# Patient Record
Sex: Female | Born: 1940 | Race: White | Hispanic: No | Marital: Married | Smoking: Never smoker
Health system: Southern US, Community
[De-identification: ages and names within clinical notes are randomized; demographics above are authoritative.]

## PROBLEM LIST (undated history)

## (undated) DIAGNOSIS — E785 Hyperlipidemia, unspecified: Secondary | ICD-10-CM

## (undated) DIAGNOSIS — M2550 Pain in unspecified joint: Secondary | ICD-10-CM

## (undated) DIAGNOSIS — D649 Anemia, unspecified: Secondary | ICD-10-CM

## (undated) DIAGNOSIS — I10 Essential (primary) hypertension: Secondary | ICD-10-CM

## (undated) DIAGNOSIS — F329 Major depressive disorder, single episode, unspecified: Secondary | ICD-10-CM

## (undated) DIAGNOSIS — R609 Edema, unspecified: Secondary | ICD-10-CM

## (undated) DIAGNOSIS — L89300 Pressure ulcer of unspecified buttock, unstageable: Secondary | ICD-10-CM

## (undated) DIAGNOSIS — R600 Localized edema: Secondary | ICD-10-CM

## (undated) DIAGNOSIS — M1990 Unspecified osteoarthritis, unspecified site: Secondary | ICD-10-CM

## (undated) DIAGNOSIS — L089 Local infection of the skin and subcutaneous tissue, unspecified: Secondary | ICD-10-CM

## (undated) DIAGNOSIS — M2540 Effusion, unspecified joint: Secondary | ICD-10-CM

## (undated) DIAGNOSIS — F32A Depression, unspecified: Secondary | ICD-10-CM

## (undated) DIAGNOSIS — H269 Unspecified cataract: Secondary | ICD-10-CM

## (undated) HISTORY — PX: BREAST BIOPSY: SHX20

## (undated) HISTORY — PX: ERCP: SHX60

## (undated) HISTORY — DX: Essential (primary) hypertension: I10

## (undated) HISTORY — PX: ESOPHAGOGASTRODUODENOSCOPY: SHX1529

---

## 1999-11-04 ENCOUNTER — Encounter: Admission: RE | Admit: 1999-11-04 | Discharge: 1999-11-04 | Payer: Self-pay

## 2000-11-07 ENCOUNTER — Encounter: Admission: RE | Admit: 2000-11-07 | Discharge: 2000-11-07 | Payer: Self-pay | Admitting: Gynecology

## 2000-11-07 ENCOUNTER — Encounter: Payer: Self-pay | Admitting: Gynecology

## 2001-08-16 ENCOUNTER — Other Ambulatory Visit: Admission: RE | Admit: 2001-08-16 | Discharge: 2001-08-16 | Payer: Self-pay | Admitting: Gynecology

## 2001-12-21 ENCOUNTER — Encounter: Payer: Self-pay | Admitting: Gynecology

## 2001-12-21 ENCOUNTER — Encounter: Admission: RE | Admit: 2001-12-21 | Discharge: 2001-12-21 | Payer: Self-pay | Admitting: Gynecology

## 2002-12-10 ENCOUNTER — Other Ambulatory Visit: Admission: RE | Admit: 2002-12-10 | Discharge: 2002-12-10 | Payer: Self-pay | Admitting: Gynecology

## 2003-01-15 ENCOUNTER — Encounter: Admission: RE | Admit: 2003-01-15 | Discharge: 2003-01-15 | Payer: Self-pay | Admitting: Gynecology

## 2003-01-15 ENCOUNTER — Encounter: Payer: Self-pay | Admitting: Gynecology

## 2004-01-08 ENCOUNTER — Other Ambulatory Visit: Admission: RE | Admit: 2004-01-08 | Discharge: 2004-01-08 | Payer: Self-pay | Admitting: Gynecology

## 2004-02-21 ENCOUNTER — Ambulatory Visit (HOSPITAL_COMMUNITY): Admission: RE | Admit: 2004-02-21 | Discharge: 2004-02-21 | Payer: Self-pay | Admitting: Gynecology

## 2005-03-08 ENCOUNTER — Other Ambulatory Visit: Admission: RE | Admit: 2005-03-08 | Discharge: 2005-03-08 | Payer: Self-pay | Admitting: Gynecology

## 2005-04-13 ENCOUNTER — Ambulatory Visit (HOSPITAL_COMMUNITY): Admission: RE | Admit: 2005-04-13 | Discharge: 2005-04-13 | Payer: Self-pay | Admitting: Gynecology

## 2005-04-22 ENCOUNTER — Encounter: Admission: RE | Admit: 2005-04-22 | Discharge: 2005-04-22 | Payer: Self-pay | Admitting: Gynecology

## 2005-04-28 ENCOUNTER — Encounter: Admission: RE | Admit: 2005-04-28 | Discharge: 2005-04-28 | Payer: Self-pay | Admitting: Gynecology

## 2005-04-28 ENCOUNTER — Encounter (INDEPENDENT_AMBULATORY_CARE_PROVIDER_SITE_OTHER): Payer: Self-pay | Admitting: *Deleted

## 2006-05-03 ENCOUNTER — Encounter: Admission: RE | Admit: 2006-05-03 | Discharge: 2006-05-03 | Payer: Self-pay | Admitting: Gynecology

## 2006-12-06 HISTORY — PX: COLONOSCOPY: SHX174

## 2007-01-23 ENCOUNTER — Other Ambulatory Visit: Admission: RE | Admit: 2007-01-23 | Discharge: 2007-01-23 | Payer: Self-pay | Admitting: Gynecology

## 2007-05-19 ENCOUNTER — Encounter: Admission: RE | Admit: 2007-05-19 | Discharge: 2007-05-19 | Payer: Self-pay | Admitting: Family Medicine

## 2007-10-25 ENCOUNTER — Ambulatory Visit: Payer: Self-pay | Admitting: Unknown Physician Specialty

## 2008-06-25 ENCOUNTER — Encounter: Admission: RE | Admit: 2008-06-25 | Discharge: 2008-06-25 | Payer: Self-pay | Admitting: Family Medicine

## 2009-07-07 ENCOUNTER — Encounter: Admission: RE | Admit: 2009-07-07 | Discharge: 2009-07-07 | Payer: Self-pay | Admitting: Family Medicine

## 2010-07-21 ENCOUNTER — Ambulatory Visit: Payer: Self-pay | Admitting: Family Medicine

## 2010-12-27 ENCOUNTER — Encounter: Payer: Self-pay | Admitting: Gynecology

## 2011-08-05 ENCOUNTER — Ambulatory Visit: Payer: Self-pay | Admitting: Family Medicine

## 2011-09-07 ENCOUNTER — Other Ambulatory Visit: Payer: Self-pay | Admitting: Gynecology

## 2012-04-07 LAB — LIPID PANEL: Cholesterol: 163 mg/dL (ref 0–200)

## 2012-05-25 ENCOUNTER — Ambulatory Visit: Payer: Self-pay | Admitting: Nurse Practitioner

## 2012-06-12 ENCOUNTER — Ambulatory Visit: Payer: Self-pay | Admitting: Gastroenterology

## 2012-07-10 ENCOUNTER — Ambulatory Visit: Payer: Self-pay | Admitting: Family Medicine

## 2012-07-13 ENCOUNTER — Ambulatory Visit: Payer: Self-pay | Admitting: Family Medicine

## 2012-07-20 ENCOUNTER — Ambulatory Visit (INDEPENDENT_AMBULATORY_CARE_PROVIDER_SITE_OTHER): Payer: MEDICARE | Admitting: Family Medicine

## 2012-07-20 ENCOUNTER — Encounter: Payer: Self-pay | Admitting: Family Medicine

## 2012-07-20 VITALS — BP 140/74 | HR 82 | Resp 18 | Ht 69.0 in | Wt 261.0 lb

## 2012-07-20 DIAGNOSIS — I10 Essential (primary) hypertension: Secondary | ICD-10-CM

## 2012-07-20 DIAGNOSIS — E119 Type 2 diabetes mellitus without complications: Secondary | ICD-10-CM | POA: Insufficient documentation

## 2012-07-20 DIAGNOSIS — M7989 Other specified soft tissue disorders: Secondary | ICD-10-CM | POA: Insufficient documentation

## 2012-07-20 DIAGNOSIS — R600 Localized edema: Secondary | ICD-10-CM | POA: Insufficient documentation

## 2012-07-20 DIAGNOSIS — F419 Anxiety disorder, unspecified: Secondary | ICD-10-CM | POA: Insufficient documentation

## 2012-07-20 DIAGNOSIS — Z1239 Encounter for other screening for malignant neoplasm of breast: Secondary | ICD-10-CM

## 2012-07-20 DIAGNOSIS — E669 Obesity, unspecified: Secondary | ICD-10-CM

## 2012-07-20 DIAGNOSIS — F411 Generalized anxiety disorder: Secondary | ICD-10-CM

## 2012-07-20 DIAGNOSIS — R609 Edema, unspecified: Secondary | ICD-10-CM

## 2012-07-20 NOTE — Patient Instructions (Addendum)
We will schedule Mammogram for  [facility]  Continue your current medications I will get your records  F/U 2 months

## 2012-07-20 NOTE — Assessment & Plan Note (Signed)
Continue lasix  °

## 2012-07-20 NOTE — Assessment & Plan Note (Signed)
Fasting CBG look very good, advised her no change in meds today, obtain her recent A1C.

## 2012-07-20 NOTE — Assessment & Plan Note (Signed)
BP suboptimal today, will trend no change to meds, records to be reviewed

## 2012-07-20 NOTE — Assessment & Plan Note (Signed)
Continue to encourage walking and watching diet

## 2012-07-20 NOTE — Assessment & Plan Note (Signed)
Followed by [facility]

## 2012-07-20 NOTE — Progress Notes (Signed)
  Subjective:    Patient ID: Lindsay Cobb, female    DOB: October 05, 1941, 71 y.o.   MRN: [id number]  HPI Here to establish care. Previous PCP [facility]. GYN- Dr. 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, previous seamstress  Review of Systems  GEN- denies fatigue, fever, weight loss,weakness, recent illness HEENT- denies eye drainage, change in vision, nasal discharge, CVS- denies chest pain, palpitations RESP- denies SOB, cough, wheeze ABD- denies N/V, change in stools, abd pain GU- denies dysuria, hematuria, dribbling, incontinence MSK- denies joint pain, muscle aches, injury Neuro- denies headache, dizziness, syncope, seizure activity      Objective:   Physical Exam GEN- NAD, alert and oriented x3 HEENT- PERRL, EOMI, non injected sclera, pink conjunctiva, MMM, oropharynx clear Neck- Supple,  CVS- RRR, no murmur RESP-CTAB ABD-NABS,soft,NT,ND EXT- no  edema Pulses- Radial, DP- 2+ Psych-normal affect and Mood        Assessment & Plan:

## 2012-07-31 ENCOUNTER — Encounter: Payer: Self-pay | Admitting: Family Medicine

## 2012-08-22 ENCOUNTER — Ambulatory Visit: Payer: Self-pay

## 2012-09-19 ENCOUNTER — Ambulatory Visit (INDEPENDENT_AMBULATORY_CARE_PROVIDER_SITE_OTHER): Payer: MEDICARE | Admitting: Family Medicine

## 2012-09-19 ENCOUNTER — Encounter: Payer: Self-pay | Admitting: Family Medicine

## 2012-09-19 VITALS — BP 136/78 | HR 80 | Resp 15 | Ht 69.0 in | Wt 262.4 lb

## 2012-09-19 DIAGNOSIS — I10 Essential (primary) hypertension: Secondary | ICD-10-CM

## 2012-09-19 DIAGNOSIS — Z23 Encounter for immunization: Secondary | ICD-10-CM

## 2012-09-19 DIAGNOSIS — E669 Obesity, unspecified: Secondary | ICD-10-CM

## 2012-09-19 DIAGNOSIS — E119 Type 2 diabetes mellitus without complications: Secondary | ICD-10-CM

## 2012-09-19 DIAGNOSIS — F419 Anxiety disorder, unspecified: Secondary | ICD-10-CM

## 2012-09-19 DIAGNOSIS — F411 Generalized anxiety disorder: Secondary | ICD-10-CM

## 2012-09-19 MED ORDER — GLIPIZIDE 5 MG PO TABS: 5.0000 mg | ORAL_TABLET | Freq: Two times a day (BID) | ORAL | Status: DC

## 2012-09-19 MED ORDER — ATENOLOL 50 MG PO TABS: 50.0000 mg | ORAL_TABLET | Freq: Every day | ORAL | Status: DC

## 2012-09-19 MED ORDER — AMLODIPINE BESYLATE 10 MG PO TABS: 10.0000 mg | ORAL_TABLET | Freq: Every day | ORAL | Status: DC

## 2012-09-19 MED ORDER — METFORMIN HCL 1000 MG PO TABS: 1000.0000 mg | ORAL_TABLET | Freq: Two times a day (BID) | ORAL | Status: DC

## 2012-09-19 MED ORDER — BENAZEPRIL HCL 20 MG PO TABS: 20.0000 mg | ORAL_TABLET | Freq: Every day | ORAL | Status: DC

## 2012-09-19 NOTE — Patient Instructions (Addendum)
Medications refilled Get labs done today, we will call with results Flu shot given  F/U 3 months

## 2012-09-20 DIAGNOSIS — Z23 Encounter for immunization: Secondary | ICD-10-CM

## 2012-09-20 LAB — HEMOGLOBIN A1C
Hgb A1c MFr Bld: 6.7 % — ABNORMAL HIGH (ref <5.7)
Mean Plasma Glucose: 146 mg/dL — ABNORMAL HIGH (ref <117)

## 2012-09-20 LAB — COMPREHENSIVE METABOLIC PANEL
ALT: 17 U/L (ref 0–35)
AST: 18 U/L (ref 0–37)
Albumin: 4.3 g/dL (ref 3.5–5.2)
BUN: 20 mg/dL (ref 6–23)
Potassium: 5.1 mEq/L (ref 3.5–5.3)

## 2012-09-20 LAB — CBC
Platelets: 264 10*3/uL (ref 150–400)
RDW: 14.4 % (ref 11.5–15.5)
WBC: 8.2 10*3/uL (ref 4.0–10.5)

## 2012-09-20 NOTE — Progress Notes (Signed)
  Subjective:    Patient ID: Lindsay Cobb, female    DOB: [birth year], 71 y.o.   MRN: [id number]  HPI  Pt here to f/u chronic medical problems, no concerns DM- Blood sugars 90-120's, no hypoglycemia, taking meds without difficulty Needs meds refilled  She has slacked off on her diet  Review of Systems  GEN- denies fatigue, fever, weight loss,weakness, recent illness HEENT- denies eye drainage, change in vision, nasal discharge, CVS- denies chest pain, palpitations RESP- denies SOB, cough, wheeze ABD- denies N/V, change in stools, abd pain GU- denies dysuria, hematuria, dribbling, incontinence MSK- denies joint pain, muscle aches, injury Neuro- denies headache, dizziness, syncope, seizure activity      Objective:   Physical Exam GEN- NAD, alert and oriented x3, obese HEENT- PERRL, EOMI, non injected sclera, pink conjunctiva, MMM, oropharynx clear Neck- Supple, no bruit CVS- RRR, no murmur RESP-CTAB EXT- +pedal edema Pulses- Radial, DP- 2+        Assessment & Plan:

## 2012-09-20 NOTE — Assessment & Plan Note (Signed)
discussed diet and need to increase activity, short term goals set

## 2012-09-20 NOTE — Assessment & Plan Note (Signed)
Continue current meds, BP looks okay

## 2012-09-20 NOTE — Assessment & Plan Note (Signed)
Doing well, followed by psychiatry

## 2012-09-20 NOTE — Assessment & Plan Note (Signed)
Check A1C, continue current meds, fasting blood sugars look good

## 2012-12-22 ENCOUNTER — Encounter: Payer: Self-pay | Admitting: Family Medicine

## 2012-12-22 ENCOUNTER — Ambulatory Visit (INDEPENDENT_AMBULATORY_CARE_PROVIDER_SITE_OTHER): Payer: Medicare Other | Admitting: Family Medicine

## 2012-12-22 VITALS — BP 128/70 | HR 71 | Resp 15 | Wt 264.0 lb

## 2012-12-22 DIAGNOSIS — E669 Obesity, unspecified: Secondary | ICD-10-CM

## 2012-12-22 DIAGNOSIS — E119 Type 2 diabetes mellitus without complications: Secondary | ICD-10-CM

## 2012-12-22 DIAGNOSIS — I10 Essential (primary) hypertension: Secondary | ICD-10-CM

## 2012-12-22 DIAGNOSIS — R609 Edema, unspecified: Secondary | ICD-10-CM

## 2012-12-22 MED ORDER — ATENOLOL 50 MG PO TABS: 50.0000 mg | ORAL_TABLET | Freq: Every day | ORAL | Status: DC

## 2012-12-22 MED ORDER — FUROSEMIDE 20 MG PO TABS: 20.0000 mg | ORAL_TABLET | Freq: Every day | ORAL | Status: DC

## 2012-12-22 MED ORDER — BENAZEPRIL HCL 20 MG PO TABS: 20.0000 mg | ORAL_TABLET | Freq: Every day | ORAL | Status: DC

## 2012-12-22 MED ORDER — GLIPIZIDE 5 MG PO TABS: 5.0000 mg | ORAL_TABLET | Freq: Two times a day (BID) | ORAL | Status: DC

## 2012-12-22 MED ORDER — METFORMIN HCL 1000 MG PO TABS: 1000.0000 mg | ORAL_TABLET | Freq: Two times a day (BID) | ORAL | Status: DC

## 2012-12-22 MED ORDER — AMLODIPINE BESYLATE 10 MG PO TABS: 10.0000 mg | ORAL_TABLET | Freq: Every day | ORAL | Status: DC

## 2012-12-22 NOTE — Assessment & Plan Note (Signed)
Discussed watching her diet and trying to walk as tolerated

## 2012-12-22 NOTE — Progress Notes (Signed)
  Subjective:    Patient ID: Lindsay Cobb, female    DOB: 08-Nov-1941, 72 y.o.   MRN: [id number]  HPI   Patient here to follow chronic medical problems. She has no specific concerns today. She was seen by podiatry in [place] and had corns removed from bilateral feet. She is doing well. She has no pain Diabetes mellitus her blood sugars fasting have been 89-138 2 hours postprandial 130 to 150. No hypoglycemia She's tolerating all of her medications without any problems.   Review of Systems  GEN- denies fatigue, fever, weight loss,weakness, recent illness HEENT- denies eye drainage, change in vision, nasal discharge, CVS- denies chest pain, palpitations RESP- denies SOB, cough, wheeze ABD- denies N/V, change in stools, abd pain GU- denies dysuria, hematuria, dribbling, incontinence MSK- denies joint pain, muscle aches, injury Neuro- denies headache, dizziness, syncope, seizure activity      Objective:   Physical Exam  GEN- NAD, alert and oriented x3, obese HEENT- PERRL, EOMI, non injected sclera, pink conjunctiva, MMM, oropharynx clear Neck- Supple, no bruit CVS- RRR, no murmur RESP-CTAB EXT- No edema,  Pulses- Radial, DP- 2+ Feet- mutlipel toes with bandaging, no other open lesions, no callus buildup seen      Assessment & Plan:

## 2012-12-22 NOTE — Assessment & Plan Note (Signed)
Blood sugars are well controlled. No change the medications she will have fasting labs before her next visit

## 2012-12-22 NOTE — Patient Instructions (Signed)
Get the labs done 1 week before your next appoint  Pneumonia shot to be done Blood sugars look Great! F/U 3 months

## 2012-12-22 NOTE — Assessment & Plan Note (Signed)
Blood pressure looks very good today no change in medication she is on ACE inhibitor, she would need urine microalbumin at her next visit

## 2012-12-22 NOTE — Addendum Note (Signed)
Addended by: Kandis Fantasia B on: 12/22/2012 04:48 PM   Modules accepted: Orders

## 2012-12-22 NOTE — Assessment & Plan Note (Signed)
No leg swelling noted today she is doing very well with the Lasix

## 2012-12-25 ENCOUNTER — Other Ambulatory Visit: Payer: Self-pay

## 2012-12-25 MED ORDER — GLIPIZIDE 5 MG PO TABS: ORAL_TABLET | ORAL | Status: DC

## 2013-01-17 ENCOUNTER — Other Ambulatory Visit: Payer: Self-pay

## 2013-01-17 MED ORDER — GLUCOSE BLOOD VI STRP: ORAL_STRIP | Status: DC

## 2013-01-22 ENCOUNTER — Telehealth: Payer: Self-pay | Admitting: Family Medicine

## 2013-01-22 NOTE — Telephone Encounter (Signed)
Sent in

## 2013-02-06 ENCOUNTER — Telehealth: Payer: Self-pay | Admitting: Family Medicine

## 2013-02-06 MED ORDER — GLIPIZIDE 5 MG PO TABS: ORAL_TABLET | ORAL | Status: DC

## 2013-02-06 NOTE — Telephone Encounter (Signed)
Refill sent in

## 2013-03-13 ENCOUNTER — Ambulatory Visit (INDEPENDENT_AMBULATORY_CARE_PROVIDER_SITE_OTHER): Payer: Medicare Other | Admitting: Family Medicine

## 2013-03-13 ENCOUNTER — Encounter: Payer: Self-pay | Admitting: Family Medicine

## 2013-03-13 VITALS — BP 144/78 | HR 84 | Resp 18 | Ht 69.0 in | Wt 262.0 lb

## 2013-03-13 DIAGNOSIS — IMO0002 Reserved for concepts with insufficient information to code with codable children: Secondary | ICD-10-CM

## 2013-03-13 DIAGNOSIS — E669 Obesity, unspecified: Secondary | ICD-10-CM

## 2013-03-13 DIAGNOSIS — I10 Essential (primary) hypertension: Secondary | ICD-10-CM

## 2013-03-13 DIAGNOSIS — E119 Type 2 diabetes mellitus without complications: Secondary | ICD-10-CM

## 2013-03-13 DIAGNOSIS — S80821A Blister (nonthermal), right lower leg, initial encounter: Secondary | ICD-10-CM

## 2013-03-13 DIAGNOSIS — D649 Anemia, unspecified: Secondary | ICD-10-CM

## 2013-03-13 LAB — LIPID PANEL
HDL: 61 mg/dL (ref >39)
LDL Cholesterol: 68 mg/dL (ref 0–99)
Total CHOL/HDL Ratio: 2.4 Ratio
Triglycerides: 99 mg/dL (ref <150)

## 2013-03-13 LAB — BASIC METABOLIC PANEL
BUN: 19 mg/dL (ref 6–23)
Calcium: 9.7 mg/dL (ref 8.4–10.5)
Creat: 0.9 mg/dL (ref 0.50–1.10)

## 2013-03-13 LAB — CBC
Hemoglobin: 12.4 g/dL (ref 12.0–15.0)
RBC: 4.4 MIL/uL (ref 3.87–5.11)

## 2013-03-13 LAB — HEMOGLOBIN A1C: Hgb A1c MFr Bld: 7.1 % — ABNORMAL HIGH (ref <5.7)

## 2013-03-13 NOTE — Patient Instructions (Signed)
Take 2 water pills if your legs swell up Do not take the skin off the blister on your leg Watch your sugar intake, handout given for your diabetes Goal is blood sugars around 120 in morning Continue blood pressure medications Medicine to [facility] mail F/U 3 months

## 2013-03-15 ENCOUNTER — Encounter: Payer: Self-pay | Admitting: Family Medicine

## 2013-03-15 DIAGNOSIS — S80829A Blister (nonthermal), unspecified lower leg, initial encounter: Secondary | ICD-10-CM | POA: Insufficient documentation

## 2013-03-15 DIAGNOSIS — D638 Anemia in other chronic diseases classified elsewhere: Secondary | ICD-10-CM | POA: Insufficient documentation

## 2013-03-15 NOTE — Progress Notes (Signed)
  Subjective:    Patient ID: Lindsay Cobb, female    DOB: 31-Aug-1941, 72 y.o.   MRN: [id number]  HPI  Patient here to follow chronic medical problems. She was concerned about her blood sugars they have been ranging from one 2150s fasting she has been snacking more on the peanuts and some other sweets. She denies any hypoglycemia she is tolerating her medications glipizide and metformin without any problem. She also has a spot on her right inner leg which she noticed a couple weeks ago it is now decreasing in size , she has been using neosporin, denies pain to area. she is being followed by podiatry as well.  Review of Systems  GEN- denies fatigue, fever, weight loss,weakness, recent illness HEENT- denies eye drainage, change in vision, nasal discharge, CVS- denies chest pain, palpitations RESP- denies SOB, cough, wheeze ABD- denies N/V, change in stools, abd pain GU- denies dysuria, hematuria, dribbling, incontinence MSK- denies joint pain, muscle aches, injury Neuro- denies headache, dizziness, syncope, seizure activity      Objective:   Physical Exam GEN- NAD, alert and oriented x3 HEENT- PERRL, EOMI, non injected sclera, pink conjunctiva, MMM, oropharynx clear CVS- RRR, no murmur RESP-CTAB EXT- No edema Pulses- Radial, DP- 2+ Skin- nickle size collapsed blister , no fluid noted, mild erythema, NT Feet- no open lesions, thick nails      Assessment & Plan:

## 2013-03-15 NOTE — Assessment & Plan Note (Signed)
Hb has been very stable, she takes daily supplement

## 2013-03-15 NOTE — Assessment & Plan Note (Signed)
Continue to work on diet and trying to walk more

## 2013-03-15 NOTE — Assessment & Plan Note (Signed)
BP up a little today, no change to medication

## 2013-03-15 NOTE — Assessment & Plan Note (Signed)
Overall still has good control, we discussed some of her snacks which may have contributed to worsening A1C from 6.7 to 7.1%, no change to meds

## 2013-03-15 NOTE — Assessment & Plan Note (Signed)
For now we will not unroof as collapsed and no signs of infection, healing without intervention

## 2013-03-22 ENCOUNTER — Ambulatory Visit: Payer: Medicare Other | Admitting: Family Medicine

## 2013-04-03 ENCOUNTER — Encounter: Payer: Self-pay | Admitting: *Deleted

## 2013-04-05 ENCOUNTER — Telehealth: Payer: Self-pay | Admitting: Family Medicine

## 2013-04-05 MED ORDER — FUROSEMIDE 20 MG PO TABS: 20.0000 mg | ORAL_TABLET | Freq: Every day | ORAL | Status: DC

## 2013-04-05 MED ORDER — BENAZEPRIL HCL 20 MG PO TABS: 20.0000 mg | ORAL_TABLET | Freq: Every day | ORAL | Status: DC

## 2013-04-05 MED ORDER — AMLODIPINE BESYLATE 10 MG PO TABS: 10.0000 mg | ORAL_TABLET | Freq: Every day | ORAL | Status: DC

## 2013-04-05 MED ORDER — ATENOLOL 50 MG PO TABS: 50.0000 mg | ORAL_TABLET | Freq: Every day | ORAL | Status: DC

## 2013-04-05 NOTE — Telephone Encounter (Signed)
meds sent

## 2013-04-09 ENCOUNTER — Telehealth: Payer: Self-pay | Admitting: Family Medicine

## 2013-04-10 NOTE — Telephone Encounter (Signed)
Attempted to return patient's call.  # is disconnected.

## 2013-05-01 ENCOUNTER — Encounter: Payer: Self-pay | Admitting: Family Medicine

## 2013-05-17 ENCOUNTER — Encounter: Payer: Self-pay | Admitting: Family Medicine

## 2013-05-17 ENCOUNTER — Ambulatory Visit (INDEPENDENT_AMBULATORY_CARE_PROVIDER_SITE_OTHER): Payer: Medicare Other | Admitting: Family Medicine

## 2013-05-17 VITALS — BP 128/66 | HR 64 | Temp 97.1°F | Resp 18 | Ht 66.0 in | Wt 252.0 lb

## 2013-05-17 DIAGNOSIS — E119 Type 2 diabetes mellitus without complications: Secondary | ICD-10-CM

## 2013-05-17 DIAGNOSIS — E669 Obesity, unspecified: Secondary | ICD-10-CM

## 2013-05-17 DIAGNOSIS — L299 Pruritus, unspecified: Secondary | ICD-10-CM

## 2013-05-17 MED ORDER — BENAZEPRIL HCL 20 MG PO TABS: 20.0000 mg | ORAL_TABLET | Freq: Every day | ORAL | Status: DC

## 2013-05-17 MED ORDER — METFORMIN HCL 1000 MG PO TABS: 1000.0000 mg | ORAL_TABLET | Freq: Two times a day (BID) | ORAL | Status: DC

## 2013-05-17 MED ORDER — ATENOLOL 50 MG PO TABS: 50.0000 mg | ORAL_TABLET | Freq: Every day | ORAL | Status: DC

## 2013-05-17 MED ORDER — FUROSEMIDE 20 MG PO TABS: 20.0000 mg | ORAL_TABLET | Freq: Every day | ORAL | Status: DC

## 2013-05-17 MED ORDER — SITAGLIPTIN PHOSPHATE 25 MG PO TABS: 25.0000 mg | ORAL_TABLET | Freq: Every day | ORAL | Status: DC

## 2013-05-17 MED ORDER — IMIPRAMINE HCL 50 MG PO TABS: 50.0000 mg | ORAL_TABLET | Freq: Three times a day (TID) | ORAL | Status: DC

## 2013-05-17 NOTE — Patient Instructions (Addendum)
Start new diabetes pill Januvia, once a day  Take the other 2 - metformin and GLipizide  Keep appt for July, we will recheck your diabetes at that tims F/U July as scheduled

## 2013-05-19 ENCOUNTER — Encounter: Payer: Self-pay | Admitting: Family Medicine

## 2013-05-19 NOTE — Assessment & Plan Note (Signed)
Last A1C showed fairly well control with 7.1% but her CBG are quite elevated for her and she is very concerned WIll not restart actos due to her peripheral edema, she also had concerns about increased risk of bladder cancer Start januvia 25mg  daily, recheck 4 weeks with A1C

## 2013-05-19 NOTE — Assessment & Plan Note (Signed)
No specific rash seen, the blistered area is healing nicely Will have her use topical cortisone as needed, she can also use benadryl Moisturize skin from dry areas

## 2013-05-19 NOTE — Progress Notes (Signed)
  Subjective:    Patient ID: Lindsay Cobb, female    DOB: 06-May-1941, 72 y.o.   MRN: [id number]  HPI  Pt here with elevated CBG, has noticed her blood sugars trending 160-190 fasting, no hypoglycemia, she has lost 10lbs intentionally. She has not felt good with her blood sugars elevated, wanted to go back on Actos. Had one day of blurry vision, now improved. RASH- has been itching on arms and legs, did not see any bumps but scratched her skin off in the middle of the night, occurs once a month where she itches severely. She also noted she had a blister like lesion come up on lower left leg, she made appt but then it popped and scabbed over, she used topical antibiotic cream, denies any pain from area  Review of Systems - per above   GEN- denies fatigue, fever, weight loss,weakness, recent illness HEENT- denies eye drainage, change in vision, nasal discharge, CVS- denies chest pain, palpitations RESP- denies SOB, cough, wheeze ABD- denies N/V, change in stools, abd painy Neuro- denies headache, dizziness, syncope, seizure activity        Objective:   Physical Exam  GEN- NAD, alert and oriented x3 HEENT- PERRL, EOMI, non injected sclera, pink conjunctiva, MMM, oropharynx clear CVS- RRR, no murmur RESP-CTAB Skin- excoriations on shin of left leg, 3cm round scab with hyperpigmented on inner left lower leg, no erythema, no induration EXT- No edema Pulses- Radial 2+        Assessment & Plan:

## 2013-05-19 NOTE — Assessment & Plan Note (Signed)
Weight loss of 10lbs intentionally , continue to work on diet

## 2013-06-12 ENCOUNTER — Ambulatory Visit: Payer: Medicare Other | Admitting: Family Medicine

## 2013-06-12 ENCOUNTER — Ambulatory Visit: Payer: Self-pay | Admitting: Family Medicine

## 2013-06-14 ENCOUNTER — Other Ambulatory Visit: Payer: Self-pay

## 2013-06-19 ENCOUNTER — Encounter: Payer: Self-pay | Admitting: Family Medicine

## 2013-06-19 ENCOUNTER — Ambulatory Visit (INDEPENDENT_AMBULATORY_CARE_PROVIDER_SITE_OTHER): Payer: Medicare Other | Admitting: Family Medicine

## 2013-06-19 VITALS — BP 110/90 | HR 84 | Temp 98.3°F | Resp 20 | Wt 258.0 lb

## 2013-06-19 DIAGNOSIS — F411 Generalized anxiety disorder: Secondary | ICD-10-CM

## 2013-06-19 DIAGNOSIS — E669 Obesity, unspecified: Secondary | ICD-10-CM

## 2013-06-19 DIAGNOSIS — E119 Type 2 diabetes mellitus without complications: Secondary | ICD-10-CM

## 2013-06-19 DIAGNOSIS — F419 Anxiety disorder, unspecified: Secondary | ICD-10-CM

## 2013-06-19 DIAGNOSIS — I10 Essential (primary) hypertension: Secondary | ICD-10-CM

## 2013-06-19 NOTE — Assessment & Plan Note (Signed)
Well controlled 

## 2013-06-19 NOTE — Patient Instructions (Addendum)
Continue current medications F/U with eye doctor Try to reschedule your foot appt F/U 3 months

## 2013-06-19 NOTE — Progress Notes (Signed)
  Subjective:    Patient ID: Lindsay Cobb, female    DOB: [birth year], 72 y.o.   MRN: [id number]  HPI  Pt here to f/u diabetes mellitus and HTN DM- last visit started on januvia 25mg . CBG have been 101-140's, had 2 readings of 170 fasting  checks CBG BID. No Hypoglycemia, due for A1C, eye visit Dr. Mitzi Davenport at end of Month, Needs to schedule [facility]. Orland Jarred in [facility] for podiatry Anxiety- doing well on imipramine needs meds refilled  Meds reviewed   Review of Systems  GEN- denies fatigue, fever, weight loss,weakness, recent illness HEENT- denies eye drainage, change in vision, nasal discharge, CVS- denies chest pain, palpitations RESP- denies SOB, cough, wheeze ABD- denies N/V, change in stools, abd pain GU- denies dysuria, hematuria, dribbling, incontinence MSK- denies joint pain, muscle aches, injury Neuro- denies headache, dizziness, syncope, seizure activity      Objective:   Physical Exam GEN- NAD, alert and oriented x3 HEENT- PERRL, EOMI, non injected sclera, pink conjunctiva, MMM, oropharynx clear Neck- Supple, no bruit CVS- RRR, no murmur RESP-CTAB EXT- pedal edema, no open lesions on feet, long toenails Pulses- Radial, DP- 2+ Psych- normal affect and mood       Assessment & Plan:

## 2013-06-19 NOTE — Assessment & Plan Note (Signed)
Controlled on imipramine, will continue, no longer seeing psychiatry has been very stable on current meds

## 2013-06-19 NOTE — Assessment & Plan Note (Signed)
A1C improved to 6.9%, continue current meds Urine Micro to be obtained, on ACEI

## 2013-06-19 NOTE — Assessment & Plan Note (Signed)
Gained some weight back, reiterated healthy eating

## 2013-06-20 LAB — MICROALBUMIN / CREATININE URINE RATIO
Creatinine, Urine: 55.7 mg/dL
Microalb Creat Ratio: 9 mg/g (ref 0.0–30.0)
Microalb, Ur: 0.5 mg/dL (ref 0.00–1.89)

## 2013-06-25 ENCOUNTER — Telehealth: Payer: Self-pay | Admitting: Family Medicine

## 2013-06-25 MED ORDER — GLIPIZIDE 10 MG PO TABS: 10.0000 mg | ORAL_TABLET | Freq: Two times a day (BID) | ORAL | Status: DC

## 2013-06-25 NOTE — Telephone Encounter (Signed)
Med refilled.

## 2013-09-19 ENCOUNTER — Ambulatory Visit (INDEPENDENT_AMBULATORY_CARE_PROVIDER_SITE_OTHER): Payer: Medicare Other | Admitting: Family Medicine

## 2013-09-19 ENCOUNTER — Encounter: Payer: Self-pay | Admitting: Family Medicine

## 2013-09-19 VITALS — BP 128/60 | HR 82 | Temp 97.3°F | Resp 18 | Wt 251.0 lb

## 2013-09-19 DIAGNOSIS — Z23 Encounter for immunization: Secondary | ICD-10-CM

## 2013-09-19 DIAGNOSIS — E119 Type 2 diabetes mellitus without complications: Secondary | ICD-10-CM

## 2013-09-19 DIAGNOSIS — F411 Generalized anxiety disorder: Secondary | ICD-10-CM

## 2013-09-19 DIAGNOSIS — F419 Anxiety disorder, unspecified: Secondary | ICD-10-CM

## 2013-09-19 DIAGNOSIS — I10 Essential (primary) hypertension: Secondary | ICD-10-CM

## 2013-09-19 MED ORDER — FUROSEMIDE 20 MG PO TABS: 20.0000 mg | ORAL_TABLET | Freq: Every day | ORAL | Status: DC

## 2013-09-19 MED ORDER — AMLODIPINE BESYLATE 10 MG PO TABS: 10.0000 mg | ORAL_TABLET | Freq: Every day | ORAL | Status: DC

## 2013-09-19 MED ORDER — IMIPRAMINE HCL 50 MG PO TABS: 50.0000 mg | ORAL_TABLET | Freq: Three times a day (TID) | ORAL | Status: DC

## 2013-09-19 MED ORDER — GLIPIZIDE 10 MG PO TABS: 10.0000 mg | ORAL_TABLET | Freq: Two times a day (BID) | ORAL | Status: DC

## 2013-09-19 MED ORDER — ATENOLOL 50 MG PO TABS: 50.0000 mg | ORAL_TABLET | Freq: Every day | ORAL | Status: DC

## 2013-09-19 MED ORDER — SITAGLIPTIN PHOSPHATE 25 MG PO TABS: 25.0000 mg | ORAL_TABLET | Freq: Every day | ORAL | Status: DC

## 2013-09-19 MED ORDER — METFORMIN HCL 1000 MG PO TABS: 1000.0000 mg | ORAL_TABLET | Freq: Two times a day (BID) | ORAL | Status: DC

## 2013-09-19 MED ORDER — BENAZEPRIL HCL 20 MG PO TABS: 20.0000 mg | ORAL_TABLET | Freq: Every day | ORAL | Status: DC

## 2013-09-19 NOTE — Assessment & Plan Note (Signed)
Well controlled, no change to meds 

## 2013-09-19 NOTE — Patient Instructions (Signed)
Release of records from Dr. Mitzi Davenport- Opthalmology Continue current medications Flu shot given F/U 4 months

## 2013-09-19 NOTE — Assessment & Plan Note (Signed)
Meds refilled.

## 2013-09-19 NOTE — Assessment & Plan Note (Signed)
Her diabetes overall is fairly well-controlled. She is on an ACE inhibitor and aspirin. She had an eye visit done I will obtain this note. She is referred back to her podiatrist for nail trimming and also to cut the corns off

## 2013-09-19 NOTE — Progress Notes (Signed)
  Subjective:    Patient ID: Lindsay Cobb, female    DOB: Apr 22, 1941, 72 y.o.   MRN: [id number]  HPI  Patient here to follow chronic medical problems. She has no specific concerns.  Diabetes mellitus- her blood sugars have been 93 to 130 fasting. In the evening time her blood sugars have been 120-160 she did have 2 episodes where she was in the 200s. No hypoglycemia no polyuria no polydipsia She and I visited by Dr. Mitzi Davenport which went well. She is due for flu shot Medications reviewed      Review of Systems  GEN- denies fatigue, fever, weight loss,weakness, recent illness HEENT- denies eye drainage, change in vision, nasal discharge, CVS- denies chest pain, palpitations RESP- denies SOB, cough, wheeze ABD- denies N/V, change in stools, abd pain GU- denies dysuria, hematuria, dribbling, incontinence MSK- denies joint pain, muscle aches, injury Neuro- denies headache, dizziness, syncope, seizure activity      Objective:   Physical Exam GEN- NAD, alert and oriented x3 HEENT- PERRL, EOMI, non injected sclera, pink conjunctiva, MMM, oropharynx clear CVS- RRR, no murmur RESP-CTAB EXT- No edema Pulses- Radial, DP- 2+ Feet- no open lesions, thick toenails, corns bilat, callus bilat        Assessment & Plan:

## 2013-09-27 ENCOUNTER — Ambulatory Visit: Payer: Self-pay

## 2013-11-15 ENCOUNTER — Other Ambulatory Visit: Payer: Self-pay | Admitting: Family Medicine

## 2013-12-23 ENCOUNTER — Other Ambulatory Visit: Payer: Self-pay | Admitting: Family Medicine

## 2013-12-24 NOTE — Telephone Encounter (Signed)
Meds refilled.

## 2014-01-04 ENCOUNTER — Telehealth: Payer: Self-pay | Admitting: Family Medicine

## 2014-01-04 NOTE — Telephone Encounter (Signed)
Completed PA request through Cover My Meds for Januvia 25 mg  Awaiting response.

## 2014-01-16 NOTE — Telephone Encounter (Signed)
Has received notice that Januvia does not need prior Authorization.

## 2014-01-21 ENCOUNTER — Ambulatory Visit (INDEPENDENT_AMBULATORY_CARE_PROVIDER_SITE_OTHER): Payer: Commercial Managed Care - HMO | Admitting: Family Medicine

## 2014-01-21 ENCOUNTER — Encounter: Payer: Self-pay | Admitting: Family Medicine

## 2014-01-21 VITALS — BP 120/80 | HR 80 | Temp 98.3°F | Resp 18 | Ht 66.0 in | Wt 258.0 lb

## 2014-01-21 DIAGNOSIS — F411 Generalized anxiety disorder: Secondary | ICD-10-CM

## 2014-01-21 DIAGNOSIS — E119 Type 2 diabetes mellitus without complications: Secondary | ICD-10-CM

## 2014-01-21 DIAGNOSIS — I10 Essential (primary) hypertension: Secondary | ICD-10-CM

## 2014-01-21 DIAGNOSIS — Z23 Encounter for immunization: Secondary | ICD-10-CM

## 2014-01-21 DIAGNOSIS — F419 Anxiety disorder, unspecified: Secondary | ICD-10-CM

## 2014-01-21 DIAGNOSIS — Z01419 Encounter for gynecological examination (general) (routine) without abnormal findings: Secondary | ICD-10-CM

## 2014-01-21 DIAGNOSIS — R110 Nausea: Secondary | ICD-10-CM

## 2014-01-21 DIAGNOSIS — E669 Obesity, unspecified: Secondary | ICD-10-CM

## 2014-01-21 MED ORDER — PROMETHAZINE HCL 25 MG/ML IJ SOLN
25.0000 mg | Freq: Once | INTRAMUSCULAR | Status: AC
Start: 2014-01-21 — End: 2014-01-21
  Administered 2014-01-21: 25 mg via INTRAMUSCULAR

## 2014-01-21 NOTE — Assessment & Plan Note (Signed)
Labs to be done, fasting CBG look great

## 2014-01-21 NOTE — Patient Instructions (Addendum)
Release of records- Dr. Mitzi DavenportBrewington- Last EYE VISIT Get the labs done fasting Prevnar 13 given Work on your weight  Referral to GYN- Dr. Ambrose MantleHenley for exam F/U 4 months

## 2014-01-21 NOTE — Assessment & Plan Note (Signed)
Doing well on imipramine.  

## 2014-01-21 NOTE — Assessment & Plan Note (Signed)
Blood pressure well controlled

## 2014-01-21 NOTE — Progress Notes (Signed)
Patient ID: Lindsay SkillernHelen M Bogdon, female   DOB: 08/10/1941, 73 y.o.   MRN: [id number]     Subjective:    Patient ID: Lindsay Cobb, female    DOB: 09/22/1941, 73 y.o.   MRN: [id number]  Patient presents for 4 mos follow up  patient to follow chronic medical problems. She has no specific concerns. Diabetes mellitus her blood sugars have ranged from 88-120 fasting she's not had any hypoglycemia symptoms no polyuria no polydipsia She has had followup with her eye Dr. and will schedule with a podiatrist here  She wanted to know she could be referred for a GYN exam she has a mammogram  up even with her age she would like to have a pelvic exam done by GYN  Meds reviewed  Review Of Systems:  GEN- denies fatigue, fever, weight loss,weakness, recent illness HEENT- denies eye drainage, change in vision, nasal discharge, CVS- denies chest pain, palpitations RESP- denies SOB, cough, wheeze ABD- denies N/V, change in stools, abd pain GU- denies dysuria, hematuria, dribbling, incontinence MSK- denies joint pain, muscle aches, injury Neuro- denies headache, dizziness, syncope, seizure activity       Objective:    BP 120/80  Pulse 80  Temp(Src) 98.3 F (36.8 C) (Oral)  Resp 18  Ht 5\' 6"  (1.676 m)  Wt 258 lb (117.028 kg)  BMI 41.66 kg/m2 GEN- NAD, alert and oriented x3 HEENT- PERRL, EOMI, non injected sclera, pink conjunctiva, MMM, oropharynx clear Neck- Supple, no thyromegaly CVS- RRR, no murmur RESP-CTAB ABD-NABS,soft,NT,ND EXT- No edema Pulses- Radial, DP- 2+        Assessment & Plan:      Problem List Items Addressed This Visit   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 mention of complication, not stated as uncontrolled - Primary     Labs to be done, fasting CBG look great    Relevant Orders      Lipid panel      Hemoglobin A1c   Obesity, unspecified     She has gained some weight, discussed importance of healthy eating, try increasing walking    Essential  hypertension, benign     Blood pressure well controlled    Relevant Orders      Comprehensive metabolic panel      CBC with Differential   Anxiety     Doing well on imipramine     Other Visit Diagnoses   Nausea alone    -- THIS DIAGNOSIS AND INJECTION IS ON WRONG PT    Relevant Medications       promethazine (PHENERGAN) injection 25 mg (Completed)    Need for prophylactic vaccination against Streptococcus pneumoniae (pneumococcus)        Relevant Orders       Pneumococcal conjugate vaccine 13-valent (Completed)       Note: This dictation was prepared with Dragon dictation along with smaller phrase technology. Any transcriptional errors that result from this process are unintentional.

## 2014-01-21 NOTE — Assessment & Plan Note (Signed)
She has gained some weight, discussed importance of healthy eating, try increasing walking

## 2014-01-21 NOTE — Progress Notes (Signed)
Pt was not given a phenergan injection.  Note made in error.

## 2014-02-06 LAB — COMPREHENSIVE METABOLIC PANEL
ALK PHOS: 57 U/L (ref 39–117)
ALT: 19 U/L (ref 0–35)
AST: 23 U/L (ref 0–37)
Albumin: 4 g/dL (ref 3.5–5.2)
BILIRUBIN TOTAL: 0.5 mg/dL (ref 0.2–1.2)
BUN: 18 mg/dL (ref 6–23)
CO2: 27 mEq/L (ref 19–32)
Calcium: 9.8 mg/dL (ref 8.4–10.5)
Chloride: 98 mEq/L (ref 96–112)
Creat: 0.84 mg/dL (ref 0.50–1.10)
Glucose, Bld: 150 mg/dL — ABNORMAL HIGH (ref 70–99)
Potassium: 4.7 mEq/L (ref 3.5–5.3)
Sodium: 136 mEq/L (ref 135–145)
Total Protein: 7.5 g/dL (ref 6.0–8.3)

## 2014-02-06 LAB — CBC WITH DIFFERENTIAL/PLATELET
BASOS PCT: 1 % (ref 0–1)
Basophils Absolute: 0.1 10*3/uL (ref 0.0–0.1)
EOS ABS: 0.6 10*3/uL (ref 0.0–0.7)
EOS PCT: 9 % — AB (ref 0–5)
HCT: 38.5 % (ref 36.0–46.0)
HEMOGLOBIN: 13 g/dL (ref 12.0–15.0)
LYMPHS ABS: 1.7 10*3/uL (ref 0.7–4.0)
Lymphocytes Relative: 24 % (ref 12–46)
MCH: 28.4 pg (ref 26.0–34.0)
MCHC: 33.8 g/dL (ref 30.0–36.0)
MCV: 84.2 fL (ref 78.0–100.0)
MONO ABS: 0.5 10*3/uL (ref 0.1–1.0)
MONOS PCT: 7 % (ref 3–12)
Neutro Abs: 4.2 10*3/uL (ref 1.7–7.7)
Neutrophils Relative %: 59 % (ref 43–77)
Platelets: 236 10*3/uL (ref 150–400)
RBC: 4.57 MIL/uL (ref 3.87–5.11)
RDW: 14.5 % (ref 11.5–15.5)
WBC: 7.2 10*3/uL (ref 4.0–10.5)

## 2014-02-06 LAB — HEMOGLOBIN A1C
Hgb A1c MFr Bld: 6.6 % — ABNORMAL HIGH (ref <5.7)
Mean Plasma Glucose: 143 mg/dL — ABNORMAL HIGH (ref <117)

## 2014-02-06 LAB — LIPID PANEL
CHOL/HDL RATIO: 2.9 ratio
CHOLESTEROL: 167 mg/dL (ref 0–200)
HDL: 57 mg/dL (ref >39)
LDL Cholesterol: 87 mg/dL (ref 0–99)
Triglycerides: 116 mg/dL (ref <150)
VLDL: 23 mg/dL (ref 0–40)

## 2014-02-06 MED ORDER — PRAVASTATIN SODIUM 10 MG PO TABS: 10.0000 mg | ORAL_TABLET | Freq: Every day | ORAL | Status: DC

## 2014-02-06 NOTE — Addendum Note (Signed)
Addended by: Milinda AntisURHAM, Jordie Schreur F on: 02/06/2014 11:44 PM   Modules accepted: Orders

## 2014-02-07 ENCOUNTER — Telehealth: Payer: Self-pay | Admitting: *Deleted

## 2014-02-07 NOTE — Telephone Encounter (Signed)
Received call from patient. Reported that new order for Pravastatin has $13.00 co-pay and requested to change medication to Lovastatin since it is on the $4.00 list.   MD please advise.

## 2014-02-08 MED ORDER — LOVASTATIN 20 MG PO TABS: 10.0000 mg | ORAL_TABLET | Freq: Every day | ORAL | Status: DC

## 2014-02-08 NOTE — Telephone Encounter (Signed)
Prescription sent to pharmacy. .   Call placed to patient and patient made aware.  

## 2014-02-08 NOTE — Telephone Encounter (Signed)
Okay to change to lovastatin 10mg 

## 2014-02-25 ENCOUNTER — Other Ambulatory Visit: Payer: Self-pay | Admitting: Family Medicine

## 2014-02-25 NOTE — Telephone Encounter (Signed)
Medication refilled per protocol. 

## 2014-02-26 ENCOUNTER — Other Ambulatory Visit: Payer: Self-pay | Admitting: *Deleted

## 2014-02-26 ENCOUNTER — Telehealth: Payer: Self-pay | Admitting: Family Medicine

## 2014-02-26 MED ORDER — GLUCOSE BLOOD VI STRP: ORAL_STRIP | Status: DC

## 2014-02-26 NOTE — Telephone Encounter (Signed)
Refill appropriate and filled per protocol. 

## 2014-02-26 NOTE — Telephone Encounter (Signed)
Prescription faxed

## 2014-02-26 NOTE — Telephone Encounter (Signed)
Call back number is [phone] Pharmacy is [facility] Pt is needing a refill on her test strips for Accu Check [facility]

## 2014-03-14 ENCOUNTER — Other Ambulatory Visit: Payer: Self-pay | Admitting: *Deleted

## 2014-03-14 MED ORDER — GLIPIZIDE 10 MG PO TABS: ORAL_TABLET | ORAL | Status: DC

## 2014-03-14 MED ORDER — ATENOLOL 50 MG PO TABS: 50.0000 mg | ORAL_TABLET | Freq: Every day | ORAL | Status: AC

## 2014-03-14 MED ORDER — BENAZEPRIL HCL 20 MG PO TABS: 20.0000 mg | ORAL_TABLET | Freq: Every day | ORAL | Status: DC

## 2014-03-14 MED ORDER — METFORMIN HCL 1000 MG PO TABS: 1000.0000 mg | ORAL_TABLET | Freq: Two times a day (BID) | ORAL | Status: DC

## 2014-03-14 MED ORDER — LOVASTATIN 20 MG PO TABS: 10.0000 mg | ORAL_TABLET | Freq: Every day | ORAL | Status: DC

## 2014-03-14 MED ORDER — FUROSEMIDE 20 MG PO TABS: 20.0000 mg | ORAL_TABLET | Freq: Every day | ORAL | Status: DC

## 2014-03-14 MED ORDER — AMLODIPINE BESYLATE 10 MG PO TABS: 10.0000 mg | ORAL_TABLET | Freq: Every day | ORAL | Status: DC

## 2014-03-14 MED ORDER — IMIPRAMINE HCL 50 MG PO TABS: 50.0000 mg | ORAL_TABLET | Freq: Three times a day (TID) | ORAL | Status: DC

## 2014-03-14 NOTE — Telephone Encounter (Signed)
Refill appropriate and filled per protocol. 

## 2014-05-01 ENCOUNTER — Telehealth: Payer: Self-pay | Admitting: Family Medicine

## 2014-05-01 ENCOUNTER — Other Ambulatory Visit: Payer: Self-pay | Admitting: Family Medicine

## 2014-05-06 LAB — HM MAMMOGRAPHY

## 2014-05-21 ENCOUNTER — Encounter: Payer: Self-pay | Admitting: Family Medicine

## 2014-05-21 ENCOUNTER — Ambulatory Visit (INDEPENDENT_AMBULATORY_CARE_PROVIDER_SITE_OTHER): Payer: Commercial Managed Care - HMO | Admitting: Family Medicine

## 2014-05-21 VITALS — BP 140/74 | HR 76 | Temp 97.4°F | Resp 14 | Ht 71.0 in | Wt 261.0 lb

## 2014-05-21 DIAGNOSIS — E119 Type 2 diabetes mellitus without complications: Secondary | ICD-10-CM

## 2014-05-21 DIAGNOSIS — E669 Obesity, unspecified: Secondary | ICD-10-CM

## 2014-05-21 DIAGNOSIS — L84 Corns and callosities: Secondary | ICD-10-CM

## 2014-05-21 DIAGNOSIS — I10 Essential (primary) hypertension: Secondary | ICD-10-CM

## 2014-05-21 MED ORDER — LOVASTATIN 10 MG PO TABS: 10.0000 mg | ORAL_TABLET | Freq: Every day | ORAL | Status: AC

## 2014-05-21 MED ORDER — LINAGLIPTIN 5 MG PO TABS: 5.0000 mg | ORAL_TABLET | Freq: Every day | ORAL | Status: DC

## 2014-05-21 NOTE — Assessment & Plan Note (Signed)
Blood pressure is well-controlled no change in medication 

## 2014-05-21 NOTE — Assessment & Plan Note (Signed)
She has a callus on her toe which is rubbing causing a red spot on the great toe. I advised her to followup with her podiatrist to see if this can be shaved down.

## 2014-05-21 NOTE — Assessment & Plan Note (Signed)
Will check another A1c we will switch her to Tradjenta to see if this is cheaper than the Januvia. We'll continue the metformin and glipizide. Her fasting blood sugars look very good. I will like to keep her A1c at least less than 7.5% On statin and ASA

## 2014-05-21 NOTE — Patient Instructions (Signed)
We will call you with lab results and instructions about Januvia Continue current medications Schedule with the foot doctor  F/U 4 months

## 2014-05-21 NOTE — Progress Notes (Signed)
Patient ID: Lindsay Cobb, female   DOB: 01/08/1941, 73 y.o.   MRN: [id number]   Subjective:    Patient ID: Lindsay Cobb, female    DOB: 05/02/1941, 73 y.o.   MRN: [id number]  Patient presents for 4 month F/U  patient here to follow chronic medical problems. She's concerned because her Januvia cough and her $5715month. Her fasting blood sugars have been 88 170 she's not had any hypoglycemia. Denies polyuria or polydipsia. Was started on statin drug at her last visit for cardiovascular protection.  She does have a spot on her toe which is a little red. She's not seeing podiatry recently. She states that she has a callus on the second digit.  Medications reviewed    Review Of Systems:  GEN- denies fatigue, fever, weight loss,weakness, recent illness HEENT- denies eye drainage, change in vision, nasal discharge, CVS- denies chest pain, palpitations RESP- denies SOB, cough, wheeze ABD- denies N/V, change in stools, abd pain GU- denies dysuria, hematuria, dribbling, incontinence MSK- +joint pain, muscle aches, injury Neuro- denies headache, dizziness, syncope, seizure activity       Objective:    BP 140/74  Pulse 76  Temp(Src) 97.4 F (36.3 C) (Oral)  Resp 14  Ht 5\' 11"  (1.803 m)  Wt 261 lb (118.389 kg)  BMI 36.42 kg/m2 GEN- NAD, alert and oriented x3 HEENT- PERRL, EOMI, non injected sclera, pink conjunctiva, MMM, oropharynx clear Neck- Supple CVS- RRR, no murmur RESP-CTAB Skin- Right foot- callus of 2nd digit on medial side near great toe, erythematous spot on great toe across from callus EXT- No edema Pulses- Radial, DP- 2+      Assessment & Plan:      Problem List Items Addressed This Visit   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 mention of complication, not stated as uncontrolled - Primary   Relevant Medications      linagliptin (TRADJENTA) tablet   Other Relevant Orders      COMPLETE METABOLIC PANEL WITH GFR      Hemoglobin A1c      LDL  Cholesterol, Direct   Obesity, unspecified   Relevant Medications      linagliptin (TRADJENTA) tablet   Essential hypertension, benign      Note: This dictation was prepared with Dragon dictation along with smaller phrase technology. Any transcriptional errors that result from this process are unintentional.

## 2014-05-22 LAB — COMPLETE METABOLIC PANEL WITH GFR
ALK PHOS: 51 U/L (ref 39–117)
ALT: 17 U/L (ref 0–35)
AST: 20 U/L (ref 0–37)
Albumin: 3.8 g/dL (ref 3.5–5.2)
BILIRUBIN TOTAL: 0.3 mg/dL (ref 0.2–1.2)
BUN: 24 mg/dL — AB (ref 6–23)
CO2: 26 mEq/L (ref 19–32)
Calcium: 9.3 mg/dL (ref 8.4–10.5)
Chloride: 101 mEq/L (ref 96–112)
Creat: 0.88 mg/dL (ref 0.50–1.10)
GFR, Est African American: 76 mL/min
GFR, Est Non African American: 66 mL/min
Glucose, Bld: 124 mg/dL — ABNORMAL HIGH (ref 70–99)
Potassium: 4.9 mEq/L (ref 3.5–5.3)
SODIUM: 137 meq/L (ref 135–145)
Total Protein: 6.6 g/dL (ref 6.0–8.3)

## 2014-05-22 LAB — HEMOGLOBIN A1C
Hgb A1c MFr Bld: 6.8 % — ABNORMAL HIGH (ref <5.7)
MEAN PLASMA GLUCOSE: 148 mg/dL — AB (ref <117)

## 2014-05-22 LAB — LDL CHOLESTEROL, DIRECT: LDL DIRECT: 75 mg/dL

## 2014-05-24 ENCOUNTER — Encounter: Payer: Self-pay | Admitting: *Deleted

## 2014-05-30 ENCOUNTER — Telehealth: Payer: Self-pay | Admitting: *Deleted

## 2014-05-30 NOTE — Telephone Encounter (Signed)
Received call from patient.   Reports that she is concerned about possible side effects of Tradjenta and is not comfortable taking it.   Reports that she would like to resume Januvia.   MD to be made aware.

## 2014-05-31 NOTE — Telephone Encounter (Signed)
Okay to continue [place]

## 2014-05-31 NOTE — Telephone Encounter (Signed)
Noted  

## 2014-06-03 ENCOUNTER — Telehealth: Payer: Self-pay | Admitting: Family Medicine

## 2014-06-03 ENCOUNTER — Other Ambulatory Visit: Payer: Self-pay | Admitting: Family Medicine

## 2014-06-03 DIAGNOSIS — Z1231 Encounter for screening mammogram for malignant neoplasm of breast: Secondary | ICD-10-CM

## 2014-06-03 MED ORDER — SITAGLIPTIN PHOSPHATE 25 MG PO TABS: ORAL_TABLET | ORAL | Status: DC

## 2014-06-03 NOTE — Telephone Encounter (Signed)
Patient would like a call back from you regarding getting a refill on her JANUVIA 25 MG tablet [phone]

## 2014-06-03 NOTE — Telephone Encounter (Signed)
Prescription sent to pharmacy.   Call placed to patient and noted phone line busy.

## 2014-06-04 ENCOUNTER — Encounter: Payer: Self-pay | Admitting: *Deleted

## 2014-06-04 NOTE — Telephone Encounter (Signed)
Patient returned call and made aware.

## 2014-06-04 NOTE — Telephone Encounter (Signed)
Call placed to patient to make aware. LMTRC. 

## 2014-06-05 ENCOUNTER — Telehealth: Payer: Self-pay | Admitting: *Deleted

## 2014-06-05 NOTE — Telephone Encounter (Signed)
Received fax requesting PA for Januvia.   PA submitted.  

## 2014-07-10 ENCOUNTER — Encounter: Payer: Self-pay | Admitting: Family Medicine

## 2014-07-10 ENCOUNTER — Ambulatory Visit (INDEPENDENT_AMBULATORY_CARE_PROVIDER_SITE_OTHER): Payer: Commercial Managed Care - HMO | Admitting: Family Medicine

## 2014-07-10 VITALS — BP 138/78 | HR 76 | Temp 97.8°F | Resp 12 | Ht 70.0 in | Wt 260.0 lb

## 2014-07-10 DIAGNOSIS — E119 Type 2 diabetes mellitus without complications: Secondary | ICD-10-CM

## 2014-07-10 LAB — MICROALBUMIN / CREATININE URINE RATIO
Creatinine, Urine: 60.4 mg/dL
MICROALB UR: 1.88 mg/dL (ref 0.00–1.89)
Microalb Creat Ratio: 31.1 mg/g — ABNORMAL HIGH (ref 0.0–30.0)

## 2014-07-10 NOTE — Patient Instructions (Signed)
Stop the [place]ajanuvia Continue all other medications F/U as previous

## 2014-07-10 NOTE — Progress Notes (Signed)
Patient ID: Lindsay Cobb, female   DOB: 03/24/1941, 73 y.o.   MRN: [id number]   Subjective:    Patient ID: Lindsay SkillernHelen M Gorby, female    DOB: 02/04/1941, 73 y.o.   MRN: [id number]  Patient presents for Discuss Medication Changes  patient here to followup diabetes mellitus medications. She was concerned because of the cost of her medications. She's unable to afford the Januvia she was also unable to Tradjenta her last A1c was 6.8% at the end of June her blood sugar readings are typically less than 120 she did bring some readings when she was off of her Januvia for about a week or so and she range from 110 to the high as 152. No hypoglycemia. She has no concerns today just here for medication review    Review Of Systems:  GEN- denies fatigue, fever, weight loss,weakness, recent illness HEENT- denies eye drainage, change in vision, nasal discharge, CVS- denies chest pain, palpitations RESP- denies SOB, cough, wheeze Neuro- denies headache, dizziness, syncope, seizure activity       Objective:    BP 138/78  Pulse 76  Temp(Src) 97.8 F (36.6 C) (Oral)  Resp 12  Ht 5\' 10"  (1.778 m)  Wt 260 lb (117.935 kg)  BMI 37.31 kg/m2 GEN- NAD, alert and oriented x3 CVS- RRR, no murmur RESP-CTAB EXT- No edema Pulses- Radial 2+        Assessment & Plan:      Problem List Items Addressed This Visit   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 mention of complication, not stated as uncontrolled - Primary   Relevant Orders      Microalbumin / creatinine urine ratio      Note: This dictation was prepared with Dragon dictation along with smaller phrase technology. Any transcriptional errors that result from this process are unintentional.

## 2014-07-10 NOTE — Assessment & Plan Note (Signed)
Her last A1c was 6.8% which is very good especially with her age. The Januvia is very low dose we will just go ahead and discontinue the Januvia 25 mg and she will continue metformin and glipizide and keep an eye on her blood sugars. I think if we keep her less than 7.5% this will be suffice

## 2014-07-13 ENCOUNTER — Encounter: Payer: Self-pay | Admitting: *Deleted

## 2014-08-09 ENCOUNTER — Other Ambulatory Visit: Payer: Self-pay | Admitting: Family Medicine

## 2014-08-09 NOTE — Telephone Encounter (Signed)
Refill appropriate and filled per protocol. 

## 2014-09-20 ENCOUNTER — Ambulatory Visit (INDEPENDENT_AMBULATORY_CARE_PROVIDER_SITE_OTHER): Payer: Commercial Managed Care - HMO | Admitting: Family Medicine

## 2014-09-20 ENCOUNTER — Encounter: Payer: Self-pay | Admitting: Family Medicine

## 2014-09-20 VITALS — BP 130/72 | HR 68 | Temp 97.9°F | Resp 14 | Ht 67.0 in | Wt 258.0 lb

## 2014-09-20 DIAGNOSIS — E119 Type 2 diabetes mellitus without complications: Secondary | ICD-10-CM

## 2014-09-20 DIAGNOSIS — I10 Essential (primary) hypertension: Secondary | ICD-10-CM

## 2014-09-20 DIAGNOSIS — E669 Obesity, unspecified: Secondary | ICD-10-CM

## 2014-09-20 DIAGNOSIS — Z23 Encounter for immunization: Secondary | ICD-10-CM

## 2014-09-20 LAB — CBC WITH DIFFERENTIAL/PLATELET
BASOS ABS: 0.1 10*3/uL (ref 0.0–0.1)
BASOS PCT: 1 % (ref 0–1)
Eosinophils Absolute: 1 10*3/uL — ABNORMAL HIGH (ref 0.0–0.7)
Eosinophils Relative: 13 % — ABNORMAL HIGH (ref 0–5)
HEMATOCRIT: 36 % (ref 36.0–46.0)
Hemoglobin: 12.4 g/dL (ref 12.0–15.0)
LYMPHS PCT: 32 % (ref 12–46)
Lymphs Abs: 2.5 10*3/uL (ref 0.7–4.0)
MCH: 28.4 pg (ref 26.0–34.0)
MCHC: 34.4 g/dL (ref 30.0–36.0)
MCV: 82.6 fL (ref 78.0–100.0)
MONO ABS: 0.8 10*3/uL (ref 0.1–1.0)
Monocytes Relative: 10 % (ref 3–12)
NEUTROS ABS: 3.5 10*3/uL (ref 1.7–7.7)
Neutrophils Relative %: 44 % (ref 43–77)
PLATELETS: 275 10*3/uL (ref 150–400)
RBC: 4.36 MIL/uL (ref 3.87–5.11)
RDW: 14.5 % (ref 11.5–15.5)
WBC: 7.9 10*3/uL (ref 4.0–10.5)

## 2014-09-20 LAB — BASIC METABOLIC PANEL WITH GFR
BUN: 22 mg/dL (ref 6–23)
CALCIUM: 9.6 mg/dL (ref 8.4–10.5)
CHLORIDE: 101 meq/L (ref 96–112)
CO2: 28 mEq/L (ref 19–32)
CREATININE: 0.82 mg/dL (ref 0.50–1.10)
GFR, EST AFRICAN AMERICAN: 83 mL/min
GFR, EST NON AFRICAN AMERICAN: 72 mL/min
Glucose, Bld: 148 mg/dL — ABNORMAL HIGH (ref 70–99)
Potassium: 4.9 mEq/L (ref 3.5–5.3)
Sodium: 136 mEq/L (ref 135–145)

## 2014-09-20 NOTE — Patient Instructions (Signed)
Schedule with foot doctor We will call with lab results Flu shot given F/U 3 months

## 2014-09-21 LAB — HEMOGLOBIN A1C
Hgb A1c MFr Bld: 7.4 % — ABNORMAL HIGH (ref <5.7)
Mean Plasma Glucose: 166 mg/dL — ABNORMAL HIGH (ref <117)

## 2014-09-22 NOTE — Progress Notes (Signed)
Patient ID: Lindsay Cobb, female   DOB: 06/30/1941, 73 y.o.   MRN: [id number]   Subjective:    Patient ID: Lindsay SkillernHelen M Mandley, female    DOB: 03/07/1941, 73 y.o.   MRN: [id number]  Patient presents for 4 month F/U   Pt here to f/u DM, CBG fasting 120-130 on average no hypoglycemia, unable to afford [place]uvia. No polyuria, polydipsia. Needs f/u with podiatry. HTN- tolerating medicatons as prescribed, no CP, no SOB    Review Of Systems:  GEN- denies fatigue, fever, weight loss,weakness, recent illness HEENT- denies eye drainage, change in vision, nasal discharge, CVS- denies chest pain, palpitations RESP- denies SOB, cough, wheeze ABD- denies N/V, change in stools, abd pain GU- denies dysuria, hematuria, dribbling, incontinence MSK- denies joint pain, muscle aches, injury Neuro- denies headache, dizziness, syncope, seizure activity       Objective:    BP 130/72  Pulse 68  Temp(Src) 97.9 F (36.6 C) (Oral)  Resp 14  Ht 5\' 7"  (1.702 m)  Wt 258 lb (117.028 kg)  BMI 40.40 kg/m2 GEN- NAD, alert and oriented x3,obese HEENT- PERRL, EOMI, non injected sclera, pink conjunctiva, MMM, oropharynx clear Neck- Supple, no thyromegaly CVS- RRR, no murmur RESP-CTAB EXT- No edema, callus of right foot Pulses- Radial, DP- 2+        Assessment & Plan:      Problem List Items Addressed This Visit   Type II diabetes mellitus   Relevant Orders      BASIC METABOLIC PANEL WITH GFR (Completed)      CBC with Differential (Completed)      Hemoglobin A1c (Completed)    Other Visit Diagnoses   Need for prophylactic vaccination and inoculation against influenza    -  Primary    Relevant Orders       Flu Vaccine QUAD 36+ mos PF IM (Fluarix Quad PF) (Completed)       Note: This dictation was prepared with Dragon dictation along with smaller phrase technology. Any transcriptional errors that result from this process are unintentional.

## 2014-09-22 NOTE — Assessment & Plan Note (Addendum)
Goal A1C < 7.5% repeat today, continue metformin and glipizide Due to cost onlow dose statin drug

## 2014-09-22 NOTE — Assessment & Plan Note (Signed)
Well controlled 

## 2014-09-27 ENCOUNTER — Other Ambulatory Visit: Payer: Self-pay | Admitting: *Deleted

## 2014-09-27 MED ORDER — METFORMIN HCL 1000 MG PO TABS: ORAL_TABLET | ORAL | Status: DC

## 2014-09-27 NOTE — Telephone Encounter (Signed)
Received call from patient.   Reports that she requires refill on Metformin.   Prescription sent to mail order and local [facility].

## 2014-10-10 ENCOUNTER — Other Ambulatory Visit: Payer: Self-pay | Admitting: *Deleted

## 2014-10-10 MED ORDER — SITAGLIPTIN PHOSPHATE 25 MG PO TABS: 25.0000 mg | ORAL_TABLET | Freq: Every day | ORAL | Status: DC

## 2014-10-10 NOTE — Telephone Encounter (Signed)
Received call from patient.   Requested refill on Januvia 25mg .   Prescription sent to pharmacy.

## 2014-11-25 ENCOUNTER — Telehealth: Payer: Self-pay | Admitting: Family Medicine

## 2014-11-25 MED ORDER — FUROSEMIDE 20 MG PO TABS: 20.0000 mg | ORAL_TABLET | Freq: Every day | ORAL | Status: DC

## 2014-11-25 NOTE — Telephone Encounter (Signed)
Med sent to pharm 

## 2014-11-25 NOTE — Telephone Encounter (Signed)
[phone]  Pt is needing a refill on furosemide (LASIX) 20 MG tablet [id number] - [place], [place] - [address]

## 2014-12-20 ENCOUNTER — Ambulatory Visit (INDEPENDENT_AMBULATORY_CARE_PROVIDER_SITE_OTHER): Payer: Commercial Managed Care - HMO | Admitting: Family Medicine

## 2014-12-20 ENCOUNTER — Encounter: Payer: Self-pay | Admitting: Family Medicine

## 2014-12-20 VITALS — BP 136/82 | HR 68 | Temp 97.8°F | Resp 16 | Ht 67.0 in | Wt 259.0 lb

## 2014-12-20 DIAGNOSIS — E669 Obesity, unspecified: Secondary | ICD-10-CM

## 2014-12-20 DIAGNOSIS — I10 Essential (primary) hypertension: Secondary | ICD-10-CM

## 2014-12-20 DIAGNOSIS — E119 Type 2 diabetes mellitus without complications: Secondary | ICD-10-CM

## 2014-12-20 DIAGNOSIS — F419 Anxiety disorder, unspecified: Secondary | ICD-10-CM

## 2014-12-20 MED ORDER — FUROSEMIDE 20 MG PO TABS: 20.0000 mg | ORAL_TABLET | Freq: Every day | ORAL | Status: DC

## 2014-12-20 NOTE — Patient Instructions (Addendum)
Obtain last note from Dr. Orland Jarredroxler [facility] in [place]  We will call with lab results F/U 4 months

## 2014-12-20 NOTE — Assessment & Plan Note (Signed)
Well controlled 

## 2014-12-20 NOTE — Progress Notes (Signed)
Patient ID: Lindsay Cobb, female   DOB: 01/09/1941, 74 y.o.   MRN: [id number]   Subjective:    Patient ID: Lindsay SkillernHelen M Keziah, female    DOB: 05/31/1941, 74 y.o.   MRN: [id number]  Patient presents for 4 month F/U  Patient here to follow-up chronic medical problems. She has no specific concerns today. She is being followed by podiatry she did sustain a toe fracture a few weeks ago by stopping her toe on the table. She also has a small burn to her right forearm which occurred when she was cooking she's been using tripling biotic ointment on this. Diabetes mellitus blood sugars have ranged from 89-140 fasting no hypoglycemia symptoms no polyuria polydipsia she has been consistent with her Januvia She is due for fasting lipid panel however every visit she is nonfasting I'm just going to go ahead and obtain this today   Review Of Systems:  GEN- denies fatigue, fever, weight loss,weakness, recent illness HEENT- denies eye drainage, change in vision, nasal discharge, CVS- denies chest pain, palpitations RESP- denies SOB, cough, wheeze ABD- denies N/V, change in stools, abd pain GU- denies dysuria, hematuria, dribbling, incontinence MSK- denies joint pain, muscle aches, injury Neuro- denies headache, dizziness, syncope, seizure activity       Objective:    BP 136/82 mmHg  Pulse 68  Temp(Src) 97.8 F (36.6 C) (Oral)  Resp 16  Ht 5\' 7"  (1.702 m)  Wt 259 lb (117.482 kg)  BMI 40.56 kg/m2 GEN- NAD, alert and oriented x3,obese HEENT- PERRL, EOMI, non injected sclera, pink conjunctiva, MMM, oropharynx clear CVS- RRR, no murmur RESP-CTAB EXT- No edema Pulses- Radial 2+        Assessment & Plan:      Problem List Items Addressed This Visit    None      Note: This dictation was prepared with Dragon dictation along with smaller phrase technology. Any transcriptional errors that result from this process are unintentional.

## 2014-12-20 NOTE — Assessment & Plan Note (Signed)
Fairly well controlled, CBG improved today, recheck non fasting labs On statin, ACE, ASA

## 2014-12-20 NOTE — Assessment & Plan Note (Signed)
Doing well om imipramine

## 2014-12-21 LAB — COMPREHENSIVE METABOLIC PANEL
ALT: 18 U/L (ref 0–35)
AST: 20 U/L (ref 0–37)
Albumin: 3.7 g/dL (ref 3.5–5.2)
Alkaline Phosphatase: 61 U/L (ref 39–117)
BILIRUBIN TOTAL: 0.3 mg/dL (ref 0.2–1.2)
BUN: 26 mg/dL — ABNORMAL HIGH (ref 6–23)
CALCIUM: 9.3 mg/dL (ref 8.4–10.5)
CO2: 29 meq/L (ref 19–32)
Chloride: 101 mEq/L (ref 96–112)
Creat: 0.88 mg/dL (ref 0.50–1.10)
Glucose, Bld: 130 mg/dL — ABNORMAL HIGH (ref 70–99)
Potassium: 5 mEq/L (ref 3.5–5.3)
Sodium: 138 mEq/L (ref 135–145)
TOTAL PROTEIN: 6.7 g/dL (ref 6.0–8.3)

## 2014-12-21 LAB — HEMOGLOBIN A1C
Hgb A1c MFr Bld: 6.8 % — ABNORMAL HIGH (ref <5.7)
MEAN PLASMA GLUCOSE: 148 mg/dL — AB (ref <117)

## 2014-12-21 LAB — LIPID PANEL
CHOL/HDL RATIO: 3.3 ratio
Cholesterol: 142 mg/dL (ref 0–200)
HDL: 43 mg/dL (ref >39)
LDL CALC: 54 mg/dL (ref 0–99)
TRIGLYCERIDES: 224 mg/dL — AB (ref <150)
VLDL: 45 mg/dL — ABNORMAL HIGH (ref 0–40)

## 2014-12-23 ENCOUNTER — Encounter: Payer: Self-pay | Admitting: *Deleted

## 2014-12-31 ENCOUNTER — Telehealth: Payer: Self-pay | Admitting: *Deleted

## 2014-12-31 MED ORDER — GLUCOSE BLOOD VI STRP: ORAL_STRIP | Status: AC

## 2014-12-31 NOTE — Telephone Encounter (Signed)
Received call from patient.   Requesting refill on test strips to be sent to [facility] in [place].   Refill appropriate and filled per protocol.

## 2015-02-10 ENCOUNTER — Telehealth: Payer: Self-pay | Admitting: Family Medicine

## 2015-02-10 MED ORDER — SITAGLIPTIN PHOSPHATE 25 MG PO TABS: 25.0000 mg | ORAL_TABLET | Freq: Every day | ORAL | Status: DC

## 2015-02-10 NOTE — Telephone Encounter (Signed)
[phone]  PT is needing a refill on sitaGLIPtin (JANUVIA) 25 MG tablet [facility]

## 2015-02-10 NOTE — Telephone Encounter (Signed)
Refill appropriate and filled per protocol. 

## 2015-02-19 ENCOUNTER — Telehealth: Payer: Self-pay | Admitting: Family Medicine

## 2015-02-19 LAB — HM DIABETES EYE EXAM

## 2015-02-26 ENCOUNTER — Other Ambulatory Visit: Payer: Self-pay | Admitting: Family Medicine

## 2015-02-27 ENCOUNTER — Telehealth: Payer: Self-pay | Admitting: *Deleted

## 2015-02-27 NOTE — Telephone Encounter (Signed)
Refill appropriate and filled per protocol. 

## 2015-02-27 NOTE — Telephone Encounter (Signed)
Submitted humana referral thru acuity connect for authorization to Dr. Chalmers Guestoy Whitaker, MD with authorization number [id number]  Requesting provider:Kawanta [place],MD  Treating provider: Daneen Schickoy Whitaker,MD  Number of visits:6  Start Date: 02/20/15  End Date: 08/19/15  Dx: E11.9-type 2 diabetes Mellitus without complications  Copy has been faxed to Dr. Channing Muttersoy office

## 2015-03-17 ENCOUNTER — Telehealth: Payer: Self-pay | Admitting: Family Medicine

## 2015-03-17 DIAGNOSIS — L84 Corns and callosities: Secondary | ICD-10-CM

## 2015-03-17 NOTE — Telephone Encounter (Signed)
Patient would like referral to foot doctor for corns on her feet  Please call her with questions at [phone] (H)

## 2015-03-19 NOTE — Telephone Encounter (Signed)
lmtrc

## 2015-03-19 NOTE — Telephone Encounter (Signed)
okay

## 2015-03-19 NOTE — Telephone Encounter (Signed)
Pt wants to go to Dr. Orland Jarredroxler in [place] at [facility]  ?ok to place referral to Podiatry

## 2015-03-20 NOTE — Telephone Encounter (Signed)
Referral to Podiatry placed.  

## 2015-03-25 ENCOUNTER — Other Ambulatory Visit: Payer: Self-pay | Admitting: Family Medicine

## 2015-03-26 NOTE — Telephone Encounter (Signed)
Refill appropriate and filled per protocol. 

## 2015-03-27 ENCOUNTER — Telehealth: Payer: Self-pay | Admitting: *Deleted

## 2015-03-27 NOTE — Telephone Encounter (Signed)
Submitted humana referral thru acuity connect for authorization to Dr.Matthew Troxler, MD Podiatry with authorization number [id number]  Requesting provider:Kawanta [place],MD  Treating provider:Matthew Troxler,MD  Number of visits:6  Start Date: 03/31/15  End Date: 09/27/15  Dx:L84-Corns and Callosities  Copy has been faxed to Dr.Troxler office at [facility] [place]

## 2015-04-21 ENCOUNTER — Ambulatory Visit (INDEPENDENT_AMBULATORY_CARE_PROVIDER_SITE_OTHER): Payer: Commercial Managed Care - HMO | Admitting: Family Medicine

## 2015-04-21 ENCOUNTER — Encounter: Payer: Self-pay | Admitting: Family Medicine

## 2015-04-21 VITALS — BP 136/68 | HR 92 | Temp 97.6°F | Resp 18 | Wt 258.0 lb

## 2015-04-21 DIAGNOSIS — E119 Type 2 diabetes mellitus without complications: Secondary | ICD-10-CM

## 2015-04-21 DIAGNOSIS — E669 Obesity, unspecified: Secondary | ICD-10-CM

## 2015-04-21 DIAGNOSIS — R609 Edema, unspecified: Secondary | ICD-10-CM

## 2015-04-21 DIAGNOSIS — I10 Essential (primary) hypertension: Secondary | ICD-10-CM | POA: Diagnosis not present

## 2015-04-21 MED ORDER — FUROSEMIDE 20 MG PO TABS: ORAL_TABLET | ORAL | Status: DC

## 2015-04-21 MED ORDER — IMIPRAMINE HCL 50 MG PO TABS: 50.0000 mg | ORAL_TABLET | Freq: Three times a day (TID) | ORAL | Status: AC

## 2015-04-21 MED ORDER — METFORMIN HCL 1000 MG PO TABS: ORAL_TABLET | ORAL | Status: AC

## 2015-04-21 MED ORDER — FUROSEMIDE 20 MG PO TABS: 20.0000 mg | ORAL_TABLET | Freq: Every day | ORAL | Status: DC

## 2015-04-21 NOTE — Patient Instructions (Signed)
Great job with the blood sugars Continue current medications We will call with lab results F/U 4 months

## 2015-04-21 NOTE — Progress Notes (Signed)
Patient ID: Lindsay Cobb, female   DOB: 01/17/1941, 74 y.o.   MRN: [id number]   Subjective:    Patient ID: Lindsay Cobb, female    DOB: 07/21/1941, 74 y.o.   MRN: [id number]  Patient presents for Follow-up  patient here to follow-up chronic medical problems. She has no particular concerns today. Her diabetes has been well-controlled her last A1c was 6.8%. Her blood sugars fasting have been 70-120 she only had one elevated blood sugar 185. She is trying to watch her diet. She denies any hypoglycemia symptoms. She is being followed by podiatry she recently had some corns removed. She has follow-up with them this Wednesday.    Review Of Systems:  GEN- denies fatigue, fever, weight loss,weakness, recent illness HEENT- denies eye drainage, change in vision, nasal discharge, CVS- denies chest pain, palpitations RESP- denies SOB, cough, wheeze ABD- denies N/V, change in stools, abd pain GU- denies dysuria, hematuria, dribbling, incontinence MSK- denies joint pain, muscle aches, injury Neuro- denies headache, dizziness, syncope, seizure activity       Objective:    BP 136/68 mmHg  Pulse 92  Temp(Src) 97.6 F (36.4 C) (Oral)  Resp 18  Wt 258 lb (117.028 kg) GEN- NAD, alert and oriented x3,obese  HEENT- PERRL, EOMI, non injected sclera, pink conjunctiva, MMM, oropharynx clear CVS- RRR, no murmur RESP-CTAB EXT-trace edema, mulitple bandages on feet from corns, mild callus bilat  Pulses- Radial, DP- 2+        Assessment & Plan:      Problem List Items Addressed This Visit    Type II diabetes mellitus - Primary   Relevant Medications   metFORMIN (GLUCOPHAGE) 1000 MG tablet   Other Relevant Orders   CBC with Differential/Platelet   Comprehensive metabolic panel   Hemoglobin A1c   Obesity   Relevant Medications   metFORMIN (GLUCOPHAGE) 1000 MG tablet   Essential hypertension, benign   Relevant Medications   furosemide (LASIX) 20 MG tablet   Other Relevant Orders   CBC  with Differential/Platelet   Comprehensive metabolic panel      Note: This dictation was prepared with Dragon dictation along with smaller phrase technology. Any transcriptional errors that result from this process are unintentional.

## 2015-04-21 NOTE — Assessment & Plan Note (Signed)
Blood pressure well controlled

## 2015-04-21 NOTE — Assessment & Plan Note (Signed)
Her diabetes is very well controlled. She will continue her current oral medications of Januvia glipizide and metformin. I'm rechecking A1c today. She is on low-dose statin drug for cardioprotection as well as ACE inhibitor she's being followed by podiatry she has been seen by ophthalmology

## 2015-04-21 NOTE — Assessment & Plan Note (Signed)
Her edema has been well controlled with the use of Lasix she uses one to tablets depending on the amount of swelling.

## 2015-04-22 LAB — COMPREHENSIVE METABOLIC PANEL
ALK PHOS: 56 U/L (ref 39–117)
ALT: 18 U/L (ref 0–35)
AST: 19 U/L (ref 0–37)
Albumin: 3.8 g/dL (ref 3.5–5.2)
BUN: 23 mg/dL (ref 6–23)
CO2: 27 mEq/L (ref 19–32)
Calcium: 9.7 mg/dL (ref 8.4–10.5)
Chloride: 102 mEq/L (ref 96–112)
Creat: 0.91 mg/dL (ref 0.50–1.10)
Glucose, Bld: 135 mg/dL — ABNORMAL HIGH (ref 70–99)
Potassium: 5.2 mEq/L (ref 3.5–5.3)
SODIUM: 138 meq/L (ref 135–145)
Total Bilirubin: 0.3 mg/dL (ref 0.2–1.2)
Total Protein: 6.9 g/dL (ref 6.0–8.3)

## 2015-04-22 LAB — CBC WITH DIFFERENTIAL/PLATELET
BASOS ABS: 0 10*3/uL (ref 0.0–0.1)
Basophils Relative: 0 % (ref 0–1)
EOS PCT: 8 % — AB (ref 0–5)
Eosinophils Absolute: 0.6 10*3/uL (ref 0.0–0.7)
HCT: 38.5 % (ref 36.0–46.0)
HEMOGLOBIN: 12.9 g/dL (ref 12.0–15.0)
LYMPHS PCT: 27 % (ref 12–46)
Lymphs Abs: 2.1 10*3/uL (ref 0.7–4.0)
MCH: 28.3 pg (ref 26.0–34.0)
MCHC: 33.5 g/dL (ref 30.0–36.0)
MCV: 84.4 fL (ref 78.0–100.0)
MONO ABS: 0.7 10*3/uL (ref 0.1–1.0)
MPV: 9.8 fL (ref 8.6–12.4)
Monocytes Relative: 9 % (ref 3–12)
Neutro Abs: 4.3 10*3/uL (ref 1.7–7.7)
Neutrophils Relative %: 56 % (ref 43–77)
Platelets: 251 10*3/uL (ref 150–400)
RBC: 4.56 MIL/uL (ref 3.87–5.11)
RDW: 14.4 % (ref 11.5–15.5)
WBC: 7.6 10*3/uL (ref 4.0–10.5)

## 2015-04-22 LAB — HEMOGLOBIN A1C
Hgb A1c MFr Bld: 6.7 % — ABNORMAL HIGH (ref <5.7)
Mean Plasma Glucose: 146 mg/dL — ABNORMAL HIGH (ref <117)

## 2015-04-24 ENCOUNTER — Encounter: Payer: Self-pay | Admitting: Family Medicine

## 2015-05-26 ENCOUNTER — Encounter: Payer: Self-pay | Admitting: *Deleted

## 2015-06-04 ENCOUNTER — Other Ambulatory Visit: Payer: Self-pay | Admitting: Family Medicine

## 2015-06-04 NOTE — Telephone Encounter (Signed)
Refill appropriate and filled per protocol. 

## 2015-07-04 ENCOUNTER — Encounter (HOSPITAL_COMMUNITY): Payer: Self-pay

## 2015-07-04 ENCOUNTER — Emergency Department (HOSPITAL_COMMUNITY)
Admission: EM | Admit: 2015-07-04 | Discharge: 2015-07-04 | Disposition: A | Discharge status: Home / Self Care | Payer: Commercial Managed Care - HMO | Attending: Emergency Medicine | Admitting: Emergency Medicine

## 2015-07-04 ENCOUNTER — Emergency Department (HOSPITAL_COMMUNITY): Payer: Commercial Managed Care - HMO

## 2015-07-04 DIAGNOSIS — I10 Essential (primary) hypertension: Secondary | ICD-10-CM | POA: Insufficient documentation

## 2015-07-04 DIAGNOSIS — Z79899 Other long term (current) drug therapy: Secondary | ICD-10-CM | POA: Insufficient documentation

## 2015-07-04 DIAGNOSIS — S60811A Abrasion of right wrist, initial encounter: Secondary | ICD-10-CM | POA: Insufficient documentation

## 2015-07-04 DIAGNOSIS — Y9389 Activity, other specified: Secondary | ICD-10-CM | POA: Diagnosis not present

## 2015-07-04 DIAGNOSIS — S60812A Abrasion of left wrist, initial encounter: Secondary | ICD-10-CM | POA: Diagnosis not present

## 2015-07-04 DIAGNOSIS — Y998 Other external cause status: Secondary | ICD-10-CM | POA: Insufficient documentation

## 2015-07-04 DIAGNOSIS — E119 Type 2 diabetes mellitus without complications: Secondary | ICD-10-CM | POA: Insufficient documentation

## 2015-07-04 DIAGNOSIS — Z7982 Long term (current) use of aspirin: Secondary | ICD-10-CM | POA: Insufficient documentation

## 2015-07-04 DIAGNOSIS — Y9289 Other specified places as the place of occurrence of the external cause: Secondary | ICD-10-CM | POA: Insufficient documentation

## 2015-07-04 DIAGNOSIS — Z87891 Personal history of nicotine dependence: Secondary | ICD-10-CM | POA: Insufficient documentation

## 2015-07-04 DIAGNOSIS — T07XXXA Unspecified multiple injuries, initial encounter: Secondary | ICD-10-CM

## 2015-07-04 DIAGNOSIS — S82002A Unspecified fracture of left patella, initial encounter for closed fracture: Secondary | ICD-10-CM

## 2015-07-04 DIAGNOSIS — S8992XA Unspecified injury of left lower leg, initial encounter: Secondary | ICD-10-CM | POA: Diagnosis present

## 2015-07-04 DIAGNOSIS — W01198A Fall on same level from slipping, tripping and stumbling with subsequent striking against other object, initial encounter: Secondary | ICD-10-CM | POA: Diagnosis not present

## 2015-07-04 MED ORDER — HYDROCODONE-ACETAMINOPHEN 5-325 MG PO TABS: 0.5000 | ORAL_TABLET | ORAL | Status: DC | PRN

## 2015-07-04 MED ORDER — BACITRACIN-NEOMYCIN-POLYMYXIN 400-5-5000 EX OINT
TOPICAL_OINTMENT | CUTANEOUS | Status: AC
  Administered 2015-07-04: 1
  Filled 2015-07-04: qty 1

## 2015-07-04 NOTE — Discharge Instructions (Signed)
As discussed, you need to see an orthopedist early this week (Monday or Tuesday) for a recheck of your injury and to discuss the need for surgery.  Call Dr. Magnus Ivan in [place], as discussed.  If you cannot drive to [place], I have provided the names of the 2 orthopedic doctors here in [place] - you may call on Monday to see if either of these doctors can see you in a reasonable timeframe (early this week).  In the meantime,  Wear your knee immobilizer at all times, elevation and ice packs will also help with pain.  You may take the medicine prescribed which will help with pain but be careful with this as it will make you sleepy. Do not perform any activities where you may injury yourself while drowsy.  No driving.    Abrasion An abrasion is a cut or scrape of the skin. Abrasions do not extend through all layers of the skin and most heal within 10 days. It is important to care for your abrasion properly to prevent infection. CAUSES  Most abrasions are caused by falling on, or gliding across, the ground or other surface. When your skin rubs on something, the outer and inner layer of skin rubs off, causing an abrasion. DIAGNOSIS  Your caregiver will be able to diagnose an abrasion during a physical exam.  TREATMENT  Your treatment depends on how large and deep the abrasion is. Generally, your abrasion will be cleaned with water and a mild soap to remove any dirt or debris. An antibiotic ointment may be put over the abrasion to prevent an infection. A bandage (dressing) may be wrapped around the abrasion to keep it from getting dirty.  You may need a tetanus shot if:  You cannot remember when you had your last tetanus shot.  You have never had a tetanus shot.  The injury broke your skin. If you get a tetanus shot, your arm may swell, get red, and feel warm to the touch. This is common and not a problem. If you need a tetanus shot and you choose not to have one, there is a rare chance of  getting tetanus. Sickness from tetanus can be serious.  HOME CARE INSTRUCTIONS   If a dressing was applied, change it at least once a day or as directed by your caregiver. If the bandage sticks, soak it off with warm water.   Wash the area with water and a mild soap to remove all the ointment 2 times a day. Rinse off the soap and pat the area dry with a clean towel.   Reapply any ointment as directed by your caregiver. This will help prevent infection and keep the bandage from sticking. Use gauze over the wound and under the dressing to help keep the bandage from sticking.   Change your dressing right away if it becomes wet or dirty.   Only take over-the-counter or prescription medicines for pain, discomfort, or fever as directed by your caregiver.   Follow up with your caregiver within 24-48 hours for a wound check, or as directed. If you were not given a wound-check appointment, look closely at your abrasion for redness, swelling, or pus. These are signs of infection. SEEK IMMEDIATE MEDICAL CARE IF:   You have increasing pain in the wound.   You have redness, swelling, or tenderness around the wound.   You have pus coming from the wound.   You have a fever or persistent symptoms for more than 2-3 days.  You  have a fever and your symptoms suddenly get worse.  You have a bad smell coming from the wound or dressing.  MAKE SURE YOU:   Understand these instructions.  Will watch your condition.  Will get help right away if you are not doing well or get worse. Document Released: 09/01/2005 Document Revised: 11/08/2012 Document Reviewed: 10/26/2011 [facility] Patient Information 2015 [place], [place]. This information is not intended to replace advice given to you by your health care provider. Make sure you discuss any questions you have with your health care provider.  Patellar Fracture, Adult A patellar fracture is a break in your kneecap (patella).  CAUSES   A direct blow  to the knee or a fall is usually the cause of a broken patella.  A very hard and strong bending of your knee can cause a patellar fracture. RISK FACTORS Involvement in contact sports, especially sports that involve a lot of jumping. SIGNS AND SYMPTOMS   Tender and swollen knee.  Pain when you move your knee, especially when you try to straighten out your leg.  Difficulty walking or putting weight on your knee.  Misshapen knee (as if a bone is out of place). DIAGNOSIS  Patellar fracture is usually diagnosed with a physical exam and an X-ray exam. TREATMENT  Treatment depends on the type of fracture:  If your patella is still in the right position after the fracture and you can still straighten your leg out, you can usually be treated with a splint or cast for 4-6 weeks.  If your patella is broken into multiple small pieces but you are able to straighten your leg, you can usually be treated with a splint or cast for 4-6 weeks. Sometimes your patella may need to be removed before the cast is applied.  If you cannot straighten out your leg after a patellar fracture, then surgery is required to hold the bony fragments together until they heal. A cast or splint will be applied for 4-6 weeks. HOME CARE INSTRUCTIONS   Only take over-the-counter or prescription medicines for pain, discomfort, or fever as directed by your health care provider.  Use crutches as directed, and exercise the leg as directed.  Apply ice to the injured area:  Put ice in a plastic bag.  Place a towel between your skin and the bag.  Leave the ice on for 20 minutes, 2-3 times a day.  Elevate the affected knee above the level of your heart. SEEK MEDICAL CARE IF:  You suspect you have significantly injured your knee.  You hear a pop after a knee injury.  Your knee is misshapen after a knee injury.  You have pain when you move your knee.  You have difficulty walking or putting weight on your knee.  You  cannot fully move your knee. SEEK IMMEDIATE MEDICAL CARE IF:  You have redness, swelling, or increasing pain in your knee.  You have a fever. Document Released: 08/21/2003 Document Revised: 09/12/2013 Document Reviewed: 07/04/2013 [facility] Patient Information 2015 [place], [place]. This information is not intended to replace advice given to you by your health care provider. Make sure you discuss any questions you have with your health care provider.

## 2015-07-04 NOTE — ED Notes (Signed)
MD at bedside. 

## 2015-07-04 NOTE — ED Notes (Signed)
Patient c/o of falling onto cement, hitting her left knee. Now c/o of left knee pain. Denies any symptoms prior to falling, denies LOC

## 2015-07-04 NOTE — ED Provider Notes (Signed)
CSN: [id number]     Arrival date & time 07/04/15  1451 History   First MD Initiated Contact with Patient 07/04/15 1522     Chief Complaint  Patient presents with  . Fall     (Consider location/radiation/quality/duration/timing/severity/associated sxs/prior Treatment) The history is provided by the patient and the spouse.   Lindsay Cobb is a 74 y.o. female with a past medical history of DM and HTN presenting with painful left knee along with nonpainful abrasions to her bilateral wrists since she tripped stepping onto her cement stoop just prior to arrival, landing directly on the left knee in flexion, then was able to control the rest of her fall using her hands.  She denies hand or wrist, forearm or upper extremity pain.  She denies hitting her head, denies neck or back pain.  She is utd with her tetanus vaccine.  She has had no medicines prior to arrival or treatment yet for her injury. She is not on any blood thinning medications.  She denies pain at this time except with attempts at weight bearing on her left leg.      Past Medical History  Diagnosis Date  . Hypertension   . Diabetes mellitus    Past Surgical History  Procedure Laterality Date  . Kidney stone surgery  July 2013   Family History  Problem Relation Age of Onset  . Hypertension Mother   . Diabetes Mother   . Hypertension Father    History  Substance Use Topics  . Smoking status: Former Games developer  . Smokeless tobacco: Never Used  . Alcohol Use: No   OB History    No data available     Review of Systems  Constitutional: Negative.  Negative for fever.  Eyes: Negative.   Respiratory: Negative.   Gastrointestinal: Negative for nausea and vomiting.  Musculoskeletal: Positive for joint swelling and arthralgias. Negative for myalgias.  Skin: Positive for wound.  Neurological: Negative for weakness, numbness and headaches.      Allergies  Review of patient's allergies indicates no known allergies.  Home  Medications   Prior to Admission medications   Medication Sig Start Date End Date Taking? Authorizing Provider  amLODipine (NORVASC) 10 MG tablet TAKE 1 TABLET EVERY DAY 03/26/15   Salley Scarlet, MD  aspirin 81 MG tablet Take 81 mg by mouth daily.    Historical Provider, MD  atenolol (TENORMIN) 50 MG tablet Take 1 tablet (50 mg total) by mouth daily. 03/14/14   Salley Scarlet, MD  benazepril (LOTENSIN) 20 MG tablet TAKE 1 TABLET EVERY DAY 03/26/15   Salley Scarlet, MD  Calcium Citrate (CITRACAL PO) Take by mouth.    Historical Provider, MD  Ferrous Gluconate (IRON) 240 (27 FE) MG TABS Take by mouth.    Historical Provider, MD  furosemide (LASIX) 20 MG tablet Take 1-2 tablets once a day as needed for leg swelling 04/21/15   Salley Scarlet, MD  glipiZIDE (GLUCOTROL) 10 MG tablet TAKE 1 TABLET TWICE DAILY BEFORE A MEAL 02/27/15   Salley Scarlet, MD  glucose blood (ACCU-CHEK AVIVA) test strip Use to monitor FSBS 1x daily. Dx: E11.9 12/31/14   Salley Scarlet, MD  HYDROcodone-acetaminophen (NORCO/VICODIN) 5-325 MG per tablet Take 0.5-1 tablets by mouth every 4 (four) hours as needed for moderate pain. 07/04/15   Burgess Amor, PA-C  imipramine (TOFRANIL) 50 MG tablet Take 1 tablet (50 mg total) by mouth 3 (three) times daily. 04/21/15   Salley Scarlet,  MD  JANUVIA 25 MG tablet TAKE ONE TABLET BY MOUTH ONCE DAILY 06/04/15   Salley Scarlet, MD  lovastatin (MEVACOR) 10 MG tablet Take 1 tablet (10 mg total) by mouth at bedtime. 05/21/14   Salley Scarlet, MD  metFORMIN (GLUCOPHAGE) 1000 MG tablet TAKE 1 TABLET TWICE DAILY WITH A MEAL 04/21/15   Salley Scarlet, MD   BP 135/53 mmHg  Pulse 82  Temp(Src) 97.7 F (36.5 C) (Oral)  Resp 16  Ht  (1.803 m)  Wt 245 lb (111.131 kg)  BMI 34.19 kg/m2  SpO2 100% Physical Exam  Constitutional: She appears well-developed and well-nourished.  HENT:  Head: Atraumatic.  Neck: Normal range of motion.  Cardiovascular:  Pulses:      Radial pulses are  2+ on the right side, and 2+ on the left side.       Dorsalis pedis pulses are 2+ on the right side, and 2+ on the left side.  Pulses equal bilaterally  Musculoskeletal: She exhibits edema and tenderness.       Left knee: She exhibits swelling, ecchymosis, deformity and abnormal patellar mobility. She exhibits no laceration and no erythema.       Cervical back: She exhibits no bony tenderness.       Thoracic back: She exhibits no bony tenderness.       Lumbar back: She exhibits no bony tenderness.  ttp with palpable deformity of the left patella.  Pt presents with left knee in 90 degree flexion, sitting in wheelchair.  Abrasion anterior left knee.  No hip, femur, lower leg or foot pain with palpation, no visible deformities in other joints of the left leg. Right leg pain free.  Hip internal/ext rotation without pain.  She has superficial abrasions on bilateral wrists.  Denies pain at these sites.  Neurological: She is alert. She has normal strength. She displays normal reflexes. No sensory deficit.  Skin: Skin is warm and dry.  Psychiatric: She has a normal mood and affect.    ED Course  Procedures (including critical care time) Labs Review Labs Reviewed - No data to display  Imaging Review Dg Knee Complete 4 Views Left  07/04/2015   CLINICAL DATA:  Left knee pain with abrasions and bruising to patellar area. Difficulty bearing weight. Fall today.  EXAM: LEFT KNEE - COMPLETE 4+ VIEW  COMPARISON:  None.  FINDINGS: There is a fracture through the inferior pole of the patella. Inferior fragment is displaced inferiorly approximately 14 mm. No additional acute bony abnormality. No subluxation or dislocation. Mild chondrocalcinosis.  IMPRESSION: Displaced fracture through the inferior pole of the left patella.   Electronically Signed   By: Charlett Nose M.D.   On: 07/04/2015 16:12     EKG Interpretation None      MDM   Final diagnoses:  Patellar fracture, left, closed, initial encounter   Multiple abrasions    Patients labs and/or radiological studies were reviewed and considered during the medical decision making and disposition process.   Imaging was reviewed, interpreted and I agree with radiologists reading.  Results were also discussed with patient. Pt was placed in knee immobilizer after abrasions were cleaned and dressed.  Tetanus is utd per pt chart.  She was prescribed hydrocodone.    Pending ortho phone consult, paged Dr Magnus Ivan given no local ortho coverage here today.  Discussed pt with Dr. Manus Gunning who also saw pt during this visit. Signed out to Ivery Quale PA-C who will discuss with Dr Magnus Ivan  to assure appropriate care and f/u care.      Burgess Amor, PA-C 07/04/15 1738  Glynn Octave, MD 07/05/15 [phone]

## 2015-07-08 ENCOUNTER — Other Ambulatory Visit (HOSPITAL_COMMUNITY): Payer: Self-pay | Admitting: Orthopaedic Surgery

## 2015-07-10 ENCOUNTER — Encounter (HOSPITAL_COMMUNITY)
Admission: RE | Admit: 2015-07-10 | Discharge: 2015-07-10 | Disposition: A | Discharge status: Home / Self Care | Payer: Commercial Managed Care - HMO | Source: Ambulatory Visit | Attending: Orthopaedic Surgery | Admitting: Orthopaedic Surgery

## 2015-07-10 ENCOUNTER — Encounter (HOSPITAL_COMMUNITY): Payer: Self-pay

## 2015-07-10 DIAGNOSIS — Z79899 Other long term (current) drug therapy: Secondary | ICD-10-CM | POA: Diagnosis not present

## 2015-07-10 DIAGNOSIS — F419 Anxiety disorder, unspecified: Secondary | ICD-10-CM | POA: Diagnosis not present

## 2015-07-10 DIAGNOSIS — R600 Localized edema: Secondary | ICD-10-CM | POA: Diagnosis not present

## 2015-07-10 DIAGNOSIS — M1990 Unspecified osteoarthritis, unspecified site: Secondary | ICD-10-CM | POA: Diagnosis not present

## 2015-07-10 DIAGNOSIS — E669 Obesity, unspecified: Secondary | ICD-10-CM | POA: Diagnosis not present

## 2015-07-10 DIAGNOSIS — E119 Type 2 diabetes mellitus without complications: Secondary | ICD-10-CM | POA: Diagnosis not present

## 2015-07-10 DIAGNOSIS — E785 Hyperlipidemia, unspecified: Secondary | ICD-10-CM | POA: Diagnosis not present

## 2015-07-10 DIAGNOSIS — I10 Essential (primary) hypertension: Secondary | ICD-10-CM | POA: Diagnosis not present

## 2015-07-10 DIAGNOSIS — F329 Major depressive disorder, single episode, unspecified: Secondary | ICD-10-CM | POA: Diagnosis not present

## 2015-07-10 DIAGNOSIS — Z7982 Long term (current) use of aspirin: Secondary | ICD-10-CM | POA: Diagnosis not present

## 2015-07-10 DIAGNOSIS — W19XXXA Unspecified fall, initial encounter: Secondary | ICD-10-CM | POA: Diagnosis not present

## 2015-07-10 DIAGNOSIS — Z6836 Body mass index (BMI) 36.0-36.9, adult: Secondary | ICD-10-CM | POA: Diagnosis not present

## 2015-07-10 DIAGNOSIS — S82092A Other fracture of left patella, initial encounter for closed fracture: Secondary | ICD-10-CM | POA: Diagnosis present

## 2015-07-10 HISTORY — DX: Unspecified osteoarthritis, unspecified site: M19.90

## 2015-07-10 HISTORY — DX: Anemia, unspecified: D64.9

## 2015-07-10 HISTORY — DX: Hyperlipidemia, unspecified: E78.5

## 2015-07-10 HISTORY — DX: Major depressive disorder, single episode, unspecified: F32.9

## 2015-07-10 HISTORY — DX: Depression, unspecified: F32.A

## 2015-07-10 HISTORY — DX: Localized edema: R60.0

## 2015-07-10 HISTORY — DX: Pain in unspecified joint: M25.50

## 2015-07-10 HISTORY — DX: Edema, unspecified: R60.9

## 2015-07-10 HISTORY — DX: Effusion, unspecified joint: M25.40

## 2015-07-10 HISTORY — DX: Unspecified cataract: H26.9

## 2015-07-10 LAB — BASIC METABOLIC PANEL
Anion gap: 9 (ref 5–15)
BUN: 26 mg/dL — ABNORMAL HIGH (ref 6–20)
CALCIUM: 9.7 mg/dL (ref 8.9–10.3)
CO2: 27 mmol/L (ref 22–32)
Chloride: 100 mmol/L — ABNORMAL LOW (ref 101–111)
Creatinine, Ser: 0.87 mg/dL (ref 0.44–1.00)
Glucose, Bld: 135 mg/dL — ABNORMAL HIGH (ref 65–99)
Potassium: 5.1 mmol/L (ref 3.5–5.1)
Sodium: 136 mmol/L (ref 135–145)

## 2015-07-10 LAB — CBC
HCT: 37.8 % (ref 36.0–46.0)
HEMOGLOBIN: 12.6 g/dL (ref 12.0–15.0)
MCH: 29 pg (ref 26.0–34.0)
MCHC: 33.3 g/dL (ref 30.0–36.0)
MCV: 86.9 fL (ref 78.0–100.0)
Platelets: 261 10*3/uL (ref 150–400)
RBC: 4.35 MIL/uL (ref 3.87–5.11)
RDW: 14 % (ref 11.5–15.5)
WBC: 9.7 10*3/uL (ref 4.0–10.5)

## 2015-07-10 LAB — GLUCOSE, CAPILLARY: GLUCOSE-CAPILLARY: 143 mg/dL — AB (ref 65–99)

## 2015-07-10 NOTE — Progress Notes (Signed)
Anesthesia Chart Review:  Pt is 74 year old female scheduled for ORIF L patella vs L partial patellectomy on 07/11/2015 with Dr. [name].   PMH includes: HTN, DM, hyperlipidemia. Never smoker. BMI 36.   Medications include: amlodipine, atenolol, benazepril, lasix, glipizide, januvia, lovastatin, metformin.   Preoperative labs reviewed.  HgbA1c pending.   EKG 07/10/2015: NSR. RBBB.   If no changes, I anticipate pt can proceed with surgery as scheduled.   Rica Mast, FNP-BC [facility] Short Stay Surgical Center/Anesthesiology Phone: [phone] 07/10/2015 4:36 PM

## 2015-07-10 NOTE — Progress Notes (Signed)
   07/10/15 1323  OBSTRUCTIVE SLEEP APNEA  Have you ever been diagnosed with sleep apnea through a sleep study? No  Do you snore loudly (loud enough to be heard through closed doors)?  0  Do you often feel tired, fatigued, or sleepy during the daytime? 0  Has anyone observed you stop breathing during your sleep? 0  Do you have, or are you being treated for high blood pressure? 1  BMI more than 35 kg/m2? 1  Age over 73 years old? 1  Neck circumference greater than 40 cm/16 inches? 1 (16)  Gender: 0

## 2015-07-10 NOTE — Progress Notes (Addendum)
Cardiologist denies having one  Medical Md is [facility]  Echo denies ever having one  Stress test denies ever having one  Heart cath denies ever having one  EKG denies having one in past yr  CXR denies having one in past yr

## 2015-07-10 NOTE — Pre-Procedure Instructions (Signed)
Delbra Zellars [facility]  07/10/2015      [facility] - [place], [place] - [address] [place] [address] Phone: [phone] Fax: [phone]  [facility] DELIV[facility] - [address] [place], [place] - [address] [place] [address] Phone: [phone] Fax: [phone]  [facility] - [place], [place] - [address] AT [facility] [address] [place] [address] Phone: [phone] Fax: [phone]    Your procedure is scheduled on Fri, Aug 5 @ 12:00 PM  Report to [facility] Admitting at 10:00 AM.  Call this number if you have problems the morning of surgery:  [phone]   Remember:  Do not eat food or drink liquids after midnight.  Take these medicines the Morning of Surgerywith A SIP OF WATER Amlodipine(Norvasc),Atenolol(Tenormin),and Pain Pill(if needed)              **Take only morning and lunch doses of Januvia,Glipizide,and Metformin day before surgery.Do Not Take morning of surgery.               Stop taking your Aspirin. No Goody's,BC's,Aleve,Ibuprofen,Fish Oil,or any Herbal Medications.    Do not wear jewelry, make-up or nail polish.  Do not wear lotions, powders, or perfumes.  You may wear deodorant.  Do not shave 48 hours prior to surgery.    Do not bring valuables to the hospital.  [facility] is not responsible for any belongings or valuables.  Contacts, dentures or bridgework may not be worn into surgery.  Leave your suitcase in the car.  After surgery it may be brought to your room.  For patients admitted to the hospital, discharge time will be determined by your treatment team.  Patients discharged the day of surgery will not be allowed to drive home.    Special instructions:  [place] - Preparing for Surgery  Before surgery, you can play an important role.  Because skin is not sterile, your skin needs to be as free of germs as possible.   You can reduce the number of germs on you skin by washing with CHG (chlorahexidine gluconate) soap before surgery.  CHG is an antiseptic cleaner which kills germs and bonds with the skin to continue killing germs even after washing.  Please DO NOT use if you have an allergy to CHG or antibacterial soaps.  If your skin becomes reddened/irritated stop using the CHG and inform your nurse when you arrive at Short Stay.  Do not shave (including legs and underarms) for at least 48 hours prior to the first CHG shower.  You may shave your face.  Please follow these instructions carefully:   1.  Shower with CHG Soap the night before surgery and the                                morning of Surgery.  2.  If you choose to wash your hair, wash your hair first as usual with your       normal shampoo.  3.  After you shampoo, rinse your hair and body thoroughly to remove the                      Shampoo.  4.  Use CHG as you would any other liquid soap.  You can apply chg  directly       to the skin and wash gently with scrungie or a clean washcloth.  5.  Apply the CHG Soap to your body ONLY FROM THE NECK DOWN.        Do not use on open wounds or open sores.  Avoid contact with your eyes,       ears, mouth and genitals (private parts).  Wash genitals (private parts)       with your normal soap.  6.  Wash thoroughly, paying special attention to the area where your surgery        will be performed.  7.  Thoroughly rinse your body with warm water from the neck down.  8.  DO NOT shower/wash with your normal soap after using and rinsing off       the CHG Soap.  9.  Pat yourself dry with a clean towel.            10.  Wear clean pajamas.            11.  Place clean sheets on your bed the night of your first shower and do not        sleep with pets.  Day of Surgery  Do not apply any lotions/deoderants the morning of surgery.  Please wear clean clothes to the hospital/surgery center.    Please read over the  following fact sheets that you were given. Pain Booklet, Coughing and Deep Breathing and Surgical Site Infection Prevention

## 2015-07-11 ENCOUNTER — Observation Stay (HOSPITAL_COMMUNITY)
Admission: RE | Admit: 2015-07-11 | Discharge: 2015-07-14 | Disposition: A | Discharge status: Skilled Nursing Facility | Payer: Commercial Managed Care - HMO | Source: Ambulatory Visit | Attending: Orthopaedic Surgery | Admitting: Orthopaedic Surgery

## 2015-07-11 ENCOUNTER — Ambulatory Visit (HOSPITAL_COMMUNITY): Payer: Commercial Managed Care - HMO | Admitting: Anesthesiology

## 2015-07-11 ENCOUNTER — Encounter (HOSPITAL_COMMUNITY): Payer: Self-pay | Admitting: *Deleted

## 2015-07-11 ENCOUNTER — Encounter (HOSPITAL_COMMUNITY)
Admission: RE | Disposition: A | Discharge status: Skilled Nursing Facility | Payer: Self-pay | Source: Ambulatory Visit | Attending: Orthopaedic Surgery

## 2015-07-11 ENCOUNTER — Ambulatory Visit (HOSPITAL_COMMUNITY): Payer: Commercial Managed Care - HMO | Admitting: Emergency Medicine

## 2015-07-11 DIAGNOSIS — F419 Anxiety disorder, unspecified: Secondary | ICD-10-CM | POA: Insufficient documentation

## 2015-07-11 DIAGNOSIS — E785 Hyperlipidemia, unspecified: Secondary | ICD-10-CM | POA: Insufficient documentation

## 2015-07-11 DIAGNOSIS — I10 Essential (primary) hypertension: Secondary | ICD-10-CM | POA: Insufficient documentation

## 2015-07-11 DIAGNOSIS — Z6836 Body mass index (BMI) 36.0-36.9, adult: Secondary | ICD-10-CM | POA: Insufficient documentation

## 2015-07-11 DIAGNOSIS — S82092A Other fracture of left patella, initial encounter for closed fracture: Principal | ICD-10-CM | POA: Insufficient documentation

## 2015-07-11 DIAGNOSIS — R600 Localized edema: Secondary | ICD-10-CM | POA: Insufficient documentation

## 2015-07-11 DIAGNOSIS — M1990 Unspecified osteoarthritis, unspecified site: Secondary | ICD-10-CM | POA: Insufficient documentation

## 2015-07-11 DIAGNOSIS — Z9889 Other specified postprocedural states: Secondary | ICD-10-CM

## 2015-07-11 DIAGNOSIS — E119 Type 2 diabetes mellitus without complications: Secondary | ICD-10-CM | POA: Diagnosis not present

## 2015-07-11 DIAGNOSIS — Z79899 Other long term (current) drug therapy: Secondary | ICD-10-CM | POA: Insufficient documentation

## 2015-07-11 DIAGNOSIS — Z8781 Personal history of (healed) traumatic fracture: Secondary | ICD-10-CM

## 2015-07-11 DIAGNOSIS — F329 Major depressive disorder, single episode, unspecified: Secondary | ICD-10-CM | POA: Insufficient documentation

## 2015-07-11 DIAGNOSIS — E669 Obesity, unspecified: Secondary | ICD-10-CM | POA: Insufficient documentation

## 2015-07-11 DIAGNOSIS — Z7982 Long term (current) use of aspirin: Secondary | ICD-10-CM | POA: Insufficient documentation

## 2015-07-11 DIAGNOSIS — W19XXXA Unspecified fall, initial encounter: Secondary | ICD-10-CM | POA: Insufficient documentation

## 2015-07-11 HISTORY — PX: PATELLECTOMY: SHX1022

## 2015-07-11 LAB — GLUCOSE, CAPILLARY
Glucose-Capillary: 143 mg/dL — ABNORMAL HIGH (ref 65–99)
Glucose-Capillary: 143 mg/dL — ABNORMAL HIGH (ref 65–99)
Glucose-Capillary: 185 mg/dL — ABNORMAL HIGH (ref 65–99)
Glucose-Capillary: 189 mg/dL — ABNORMAL HIGH (ref 65–99)

## 2015-07-11 LAB — HEMOGLOBIN A1C
HEMOGLOBIN A1C: 7.1 % — AB (ref 4.8–5.6)
MEAN PLASMA GLUCOSE: 157 mg/dL

## 2015-07-11 SURGERY — PATELLECTOMY
Anesthesia: General | Site: Knee | Laterality: Left

## 2015-07-11 MED ORDER — OXYCODONE HCL 5 MG PO TABS: 5.0000 mg | ORAL_TABLET | ORAL | Status: DC | PRN

## 2015-07-11 MED ORDER — ATENOLOL 50 MG PO TABS
50.0000 mg | ORAL_TABLET | Freq: Every day | ORAL | Status: DC
  Administered 2015-07-12 – 2015-07-14 (×3): 50 mg via ORAL
  Filled 2015-07-11 (×4): qty 1

## 2015-07-11 MED ORDER — IMIPRAMINE HCL 50 MG PO TABS
50.0000 mg | ORAL_TABLET | Freq: Three times a day (TID) | ORAL | Status: DC
  Administered 2015-07-11 – 2015-07-14 (×9): 50 mg via ORAL
  Filled 2015-07-11 (×12): qty 1

## 2015-07-11 MED ORDER — METHOCARBAMOL 500 MG PO TABS
ORAL_TABLET | ORAL | Status: AC
  Administered 2015-07-11: 500 mg via ORAL
  Filled 2015-07-11: qty 1

## 2015-07-11 MED ORDER — PROMETHAZINE HCL 25 MG/ML IJ SOLN: 6.2500 mg | INTRAMUSCULAR | Status: DC | PRN

## 2015-07-11 MED ORDER — MEPERIDINE HCL 25 MG/ML IJ SOLN: 6.2500 mg | INTRAMUSCULAR | Status: DC | PRN

## 2015-07-11 MED ORDER — DEXAMETHASONE SODIUM PHOSPHATE 4 MG/ML IJ SOLN
INTRAMUSCULAR | Status: DC | PRN
  Administered 2015-07-11: 6 mg via INTRAVENOUS

## 2015-07-11 MED ORDER — CEFAZOLIN SODIUM-DEXTROSE 2-3 GM-% IV SOLR
2.0000 g | INTRAVENOUS | Status: AC
  Administered 2015-07-11: 2 g via INTRAVENOUS
  Filled 2015-07-11: qty 50

## 2015-07-11 MED ORDER — PHENYLEPHRINE HCL 10 MG/ML IJ SOLN
INTRAMUSCULAR | Status: DC | PRN
  Administered 2015-07-11: 80 ug via INTRAVENOUS
  Administered 2015-07-11: 40 ug via INTRAVENOUS
  Administered 2015-07-11 (×2): 80 ug via INTRAVENOUS

## 2015-07-11 MED ORDER — SUCCINYLCHOLINE CHLORIDE 20 MG/ML IJ SOLN
INTRAMUSCULAR | Status: DC | PRN
  Administered 2015-07-11: 140 mg via INTRAVENOUS

## 2015-07-11 MED ORDER — INSULIN ASPART 100 UNIT/ML ~~LOC~~ SOLN
0.0000 [IU] | Freq: Three times a day (TID) | SUBCUTANEOUS | Status: DC
  Administered 2015-07-11 – 2015-07-12 (×2): 4 [IU] via SUBCUTANEOUS
  Administered 2015-07-12: 3 [IU] via SUBCUTANEOUS
  Administered 2015-07-12: 4 [IU] via SUBCUTANEOUS
  Administered 2015-07-13: 7 [IU] via SUBCUTANEOUS
  Administered 2015-07-13: 4 [IU] via SUBCUTANEOUS
  Administered 2015-07-13: 7 [IU] via SUBCUTANEOUS
  Administered 2015-07-14: 4 [IU] via SUBCUTANEOUS
  Administered 2015-07-14: 7 [IU] via SUBCUTANEOUS

## 2015-07-11 MED ORDER — GLIPIZIDE 5 MG PO TABS
10.0000 mg | ORAL_TABLET | Freq: Every day | ORAL | Status: DC
  Administered 2015-07-12 – 2015-07-14 (×3): 10 mg via ORAL
  Filled 2015-07-11 (×3): qty 2

## 2015-07-11 MED ORDER — ASPIRIN EC 325 MG PO TBEC
325.0000 mg | DELAYED_RELEASE_TABLET | Freq: Every day | ORAL | Status: DC
Start: 2015-07-11 — End: 2015-07-14
  Administered 2015-07-11 – 2015-07-14 (×4): 325 mg via ORAL
  Filled 2015-07-11 (×4): qty 1

## 2015-07-11 MED ORDER — OXYCODONE HCL 5 MG PO TABS
5.0000 mg | ORAL_TABLET | ORAL | Status: DC | PRN
  Administered 2015-07-11 – 2015-07-12 (×4): 10 mg via ORAL
  Administered 2015-07-12: 5 mg via ORAL
  Administered 2015-07-12: 10 mg via ORAL
  Administered 2015-07-12: 5 mg via ORAL
  Administered 2015-07-12 – 2015-07-13 (×4): 10 mg via ORAL
  Filled 2015-07-11: qty 2
  Filled 2015-07-11: qty 1
  Filled 2015-07-11 (×8): qty 2

## 2015-07-11 MED ORDER — LINAGLIPTIN 5 MG PO TABS
5.0000 mg | ORAL_TABLET | Freq: Every day | ORAL | Status: DC
Start: 2015-07-11 — End: 2015-07-14
  Administered 2015-07-12 – 2015-07-14 (×3): 5 mg via ORAL
  Filled 2015-07-11 (×3): qty 1

## 2015-07-11 MED ORDER — FENTANYL CITRATE (PF) 100 MCG/2ML IJ SOLN
INTRAMUSCULAR | Status: DC | PRN
  Administered 2015-07-11: 50 ug via INTRAVENOUS

## 2015-07-11 MED ORDER — SODIUM CHLORIDE 0.9 % IV SOLN
INTRAVENOUS | Status: DC
  Administered 2015-07-11: 22:00:00 via INTRAVENOUS
  Administered 2015-07-13: 20 mL/h via INTRAVENOUS

## 2015-07-11 MED ORDER — ONDANSETRON HCL 4 MG/2ML IJ SOLN
INTRAMUSCULAR | Status: AC
  Filled 2015-07-11: qty 2

## 2015-07-11 MED ORDER — ASPIRIN EC 325 MG PO TBEC: 325.0000 mg | DELAYED_RELEASE_TABLET | Freq: Every day | ORAL | Status: AC

## 2015-07-11 MED ORDER — PROPOFOL 10 MG/ML IV BOLUS
INTRAVENOUS | Status: AC
  Filled 2015-07-11: qty 20

## 2015-07-11 MED ORDER — PROPOFOL 10 MG/ML IV BOLUS
INTRAVENOUS | Status: DC | PRN
  Administered 2015-07-11: 150 mg via INTRAVENOUS
  Administered 2015-07-11 (×2): 50 mg via INTRAVENOUS
  Administered 2015-07-11: 30 mg via INTRAVENOUS

## 2015-07-11 MED ORDER — BENAZEPRIL HCL 20 MG PO TABS
20.0000 mg | ORAL_TABLET | Freq: Every day | ORAL | Status: DC
  Administered 2015-07-12 – 2015-07-14 (×3): 20 mg via ORAL
  Filled 2015-07-11 (×3): qty 1

## 2015-07-11 MED ORDER — BUPIVACAINE-EPINEPHRINE (PF) 0.5% -1:200000 IJ SOLN
INTRAMUSCULAR | Status: DC | PRN
  Administered 2015-07-11: 20 mL via PERINEURAL

## 2015-07-11 MED ORDER — FENTANYL CITRATE (PF) 100 MCG/2ML IJ SOLN
50.0000 ug | INTRAMUSCULAR | Status: DC | PRN
  Administered 2015-07-11: 25 ug via INTRAVENOUS

## 2015-07-11 MED ORDER — ACETAMINOPHEN 650 MG RE SUPP: 650.0000 mg | Freq: Four times a day (QID) | RECTAL | Status: DC | PRN

## 2015-07-11 MED ORDER — ONDANSETRON HCL 4 MG/2ML IJ SOLN: 4.0000 mg | Freq: Four times a day (QID) | INTRAMUSCULAR | Status: DC | PRN

## 2015-07-11 MED ORDER — ONDANSETRON HCL 4 MG PO TABS: 4.0000 mg | ORAL_TABLET | Freq: Four times a day (QID) | ORAL | Status: DC | PRN

## 2015-07-11 MED ORDER — EPHEDRINE SULFATE 50 MG/ML IJ SOLN
INTRAMUSCULAR | Status: DC | PRN
  Administered 2015-07-11 (×4): 10 mg via INTRAVENOUS

## 2015-07-11 MED ORDER — HYDROMORPHONE HCL 1 MG/ML IJ SOLN
0.2500 mg | INTRAMUSCULAR | Status: DC | PRN
  Administered 2015-07-11 (×2): 0.5 mg via INTRAVENOUS

## 2015-07-11 MED ORDER — FENTANYL CITRATE (PF) 250 MCG/5ML IJ SOLN
INTRAMUSCULAR | Status: AC
  Filled 2015-07-11: qty 5

## 2015-07-11 MED ORDER — AMLODIPINE BESYLATE 10 MG PO TABS
10.0000 mg | ORAL_TABLET | Freq: Every day | ORAL | Status: DC
Start: 2015-07-11 — End: 2015-07-14
  Administered 2015-07-12 – 2015-07-14 (×3): 10 mg via ORAL
  Filled 2015-07-11 (×4): qty 1

## 2015-07-11 MED ORDER — PRAVASTATIN SODIUM 40 MG PO TABS
40.0000 mg | ORAL_TABLET | Freq: Every day | ORAL | Status: DC
  Administered 2015-07-11 – 2015-07-13 (×3): 40 mg via ORAL
  Filled 2015-07-11 (×3): qty 1

## 2015-07-11 MED ORDER — PHENYLEPHRINE 40 MCG/ML (10ML) SYRINGE FOR IV PUSH (FOR BLOOD PRESSURE SUPPORT)
PREFILLED_SYRINGE | INTRAVENOUS | Status: AC
  Filled 2015-07-11: qty 10

## 2015-07-11 MED ORDER — ACETAMINOPHEN 325 MG PO TABS: 650.0000 mg | ORAL_TABLET | Freq: Four times a day (QID) | ORAL | Status: DC | PRN

## 2015-07-11 MED ORDER — LIDOCAINE HCL (CARDIAC) 20 MG/ML IV SOLN
INTRAVENOUS | Status: DC | PRN
  Administered 2015-07-11: 80 mg via INTRAVENOUS

## 2015-07-11 MED ORDER — LACTATED RINGERS IV SOLN: INTRAVENOUS | Status: DC

## 2015-07-11 MED ORDER — METHOCARBAMOL 500 MG PO TABS
500.0000 mg | ORAL_TABLET | Freq: Four times a day (QID) | ORAL | Status: DC | PRN
  Administered 2015-07-11 – 2015-07-13 (×5): 500 mg via ORAL
  Filled 2015-07-11 (×5): qty 1

## 2015-07-11 MED ORDER — 0.9 % SODIUM CHLORIDE (POUR BTL) OPTIME
TOPICAL | Status: DC | PRN
  Administered 2015-07-11: 1000 mL

## 2015-07-11 MED ORDER — MIDAZOLAM HCL 2 MG/2ML IJ SOLN
1.0000 mg | INTRAMUSCULAR | Status: DC | PRN
  Administered 2015-07-11: 1 mg via INTRAVENOUS

## 2015-07-11 MED ORDER — ONDANSETRON HCL 4 MG/2ML IJ SOLN
INTRAMUSCULAR | Status: DC | PRN
  Administered 2015-07-11: 4 mg via INTRAVENOUS

## 2015-07-11 MED ORDER — INSULIN ASPART 100 UNIT/ML ~~LOC~~ SOLN: 0.0000 [IU] | Freq: Every day | SUBCUTANEOUS | Status: DC

## 2015-07-11 MED ORDER — HYDROMORPHONE HCL 1 MG/ML IJ SOLN
INTRAMUSCULAR | Status: AC
  Administered 2015-07-11: 0.5 mg via INTRAVENOUS
  Filled 2015-07-11: qty 1

## 2015-07-11 MED ORDER — DEXAMETHASONE SODIUM PHOSPHATE 4 MG/ML IJ SOLN
INTRAMUSCULAR | Status: AC
  Filled 2015-07-11: qty 2

## 2015-07-11 MED ORDER — DIPHENHYDRAMINE HCL 12.5 MG/5ML PO ELIX
25.0000 mg | ORAL_SOLUTION | ORAL | Status: DC | PRN
Start: 2015-07-11 — End: 2015-07-14

## 2015-07-11 MED ORDER — MIDAZOLAM HCL 2 MG/2ML IJ SOLN
INTRAMUSCULAR | Status: AC
  Administered 2015-07-11: 1 mg via INTRAVENOUS
  Filled 2015-07-11: qty 2

## 2015-07-11 MED ORDER — LACTATED RINGERS IV SOLN
INTRAVENOUS | Status: DC | PRN
  Administered 2015-07-11 (×2): via INTRAVENOUS

## 2015-07-11 MED ORDER — LIDOCAINE HCL (CARDIAC) 20 MG/ML IV SOLN
INTRAVENOUS | Status: AC
  Filled 2015-07-11: qty 5

## 2015-07-11 MED ORDER — LACTATED RINGERS IV SOLN
INTRAVENOUS | Status: DC
  Administered 2015-07-11: 11:00:00 via INTRAVENOUS

## 2015-07-11 MED ORDER — CEFAZOLIN SODIUM-DEXTROSE 2-3 GM-% IV SOLR
2.0000 g | Freq: Four times a day (QID) | INTRAVENOUS | Status: AC
  Administered 2015-07-11 – 2015-07-12 (×3): 2 g via INTRAVENOUS
  Filled 2015-07-11 (×3): qty 50

## 2015-07-11 MED ORDER — MORPHINE SULFATE 2 MG/ML IJ SOLN: 1.0000 mg | INTRAMUSCULAR | Status: DC | PRN

## 2015-07-11 MED ORDER — METOCLOPRAMIDE HCL 5 MG PO TABS: 5.0000 mg | ORAL_TABLET | Freq: Three times a day (TID) | ORAL | Status: DC | PRN

## 2015-07-11 MED ORDER — METHOCARBAMOL 1000 MG/10ML IJ SOLN
500.0000 mg | Freq: Four times a day (QID) | INTRAVENOUS | Status: DC | PRN
  Filled 2015-07-11: qty 5

## 2015-07-11 MED ORDER — FENTANYL CITRATE (PF) 100 MCG/2ML IJ SOLN
INTRAMUSCULAR | Status: AC
  Administered 2015-07-11: 25 ug via INTRAVENOUS
  Filled 2015-07-11: qty 2

## 2015-07-11 MED ORDER — METOCLOPRAMIDE HCL 5 MG/ML IJ SOLN: 5.0000 mg | Freq: Three times a day (TID) | INTRAMUSCULAR | Status: DC | PRN

## 2015-07-11 MED ORDER — OXYCODONE HCL 5 MG PO TABS
ORAL_TABLET | ORAL | Status: AC
  Administered 2015-07-11: 10 mg via ORAL
  Filled 2015-07-11: qty 2

## 2015-07-11 SURGICAL SUPPLY — 58 items
BANDAGE ELASTIC 4 VELCRO ST LF (GAUZE/BANDAGES/DRESSINGS) IMPLANT
BANDAGE ELASTIC 6 VELCRO ST LF (GAUZE/BANDAGES/DRESSINGS) IMPLANT
BNDG COHESIVE 4X5 TAN STRL (GAUZE/BANDAGES/DRESSINGS) IMPLANT
BNDG COHESIVE 6X5 TAN STRL LF (GAUZE/BANDAGES/DRESSINGS) ×4 IMPLANT
COVER SURGICAL LIGHT HANDLE (MISCELLANEOUS) ×4 IMPLANT
CUFF TOURNIQUET SINGLE 34IN LL (TOURNIQUET CUFF) ×3 IMPLANT
CUFF TOURNIQUET SINGLE 44IN (TOURNIQUET CUFF) IMPLANT
DRAPE C-ARM 42X72 X-RAY (DRAPES) ×3 IMPLANT
DRAPE C-ARMOR (DRAPES) ×3 IMPLANT
DRAPE IMP U-DRAPE 54X76 (DRAPES) ×4 IMPLANT
DRAPE INCISE IOBAN 66X45 STRL (DRAPES) ×4 IMPLANT
DRAPE U-SHAPE 47X51 STRL (DRAPES) ×3 IMPLANT
DRSG AQUACEL AG ADV 3.5X10 (GAUZE/BANDAGES/DRESSINGS) ×3 IMPLANT
DURAPREP 26ML APPLICATOR (WOUND CARE) ×4 IMPLANT
ELECT CAUTERY BLADE 6.4 (BLADE) ×4 IMPLANT
ELECT REM PT RETURN 9FT ADLT (ELECTROSURGICAL) ×4
ELECTRODE REM PT RTRN 9FT ADLT (ELECTROSURGICAL) ×2 IMPLANT
FACESHIELD WRAPAROUND (MASK) ×8 IMPLANT
FACESHIELD WRAPAROUND OR TEAM (MASK) IMPLANT
GAUZE SPONGE 4X4 12PLY STRL (GAUZE/BANDAGES/DRESSINGS) ×4 IMPLANT
GAUZE XEROFORM 5X9 LF (GAUZE/BANDAGES/DRESSINGS) IMPLANT
GLOVE NEODERM STRL 7.5 LF PF (GLOVE) ×4 IMPLANT
GLOVE SURG NEODERM 7.5  LF PF (GLOVE) ×4
GOWN STRL REIN XL XLG (GOWN DISPOSABLE) ×6 IMPLANT
K-WIRE 2.0 (WIRE) ×4
K-WIRE TROCAR PT 2.0 150MM (WIRE) ×4
KIT BASIN OR (CUSTOM PROCEDURE TRAY) ×4 IMPLANT
KIT ROOM TURNOVER OR (KITS) ×4 IMPLANT
KWIRE TROCAR PT 2.0 150 (WIRE) IMPLANT
KWIRE TROCAR PT 2.0 150MM (WIRE) ×4 IMPLANT
NDL HYPO 25GX1X1/2 BEV (NEEDLE) IMPLANT
NEEDLE HYPO 25GX1X1/2 BEV (NEEDLE) IMPLANT
NS IRRIG 1000ML POUR BTL (IV SOLUTION) ×4 IMPLANT
PACK ORTHO EXTREMITY (CUSTOM PROCEDURE TRAY) ×4 IMPLANT
PAD ARMBOARD 7.5X6 YLW CONV (MISCELLANEOUS) ×8 IMPLANT
PAD CAST 3X4 CTTN HI CHSV (CAST SUPPLIES) IMPLANT
PADDING CAST COTTON 3X4 STRL (CAST SUPPLIES)
PADDING CAST COTTON 6X4 STRL (CAST SUPPLIES) IMPLANT
PASSER SUT SWANSON 36MM LOOP (INSTRUMENTS) IMPLANT
SPONGE LAP 18X18 X RAY DECT (DISPOSABLE) ×4 IMPLANT
STAPLER VISISTAT 35W (STAPLE) ×3 IMPLANT
STOCKINETTE IMPERVIOUS LG (DRAPES) ×4 IMPLANT
SUCTION FRAZIER TIP 10 FR DISP (SUCTIONS) ×4 IMPLANT
SUT ETHIBOND 5 LR DA (SUTURE) ×12 IMPLANT
SUT ETHILON 2 0 FS 18 (SUTURE) IMPLANT
SUT ETHILON 3 0 PS 1 (SUTURE) ×3 IMPLANT
SUT FIBERWIRE #2 38 T-5 BLUE (SUTURE)
SUT VIC AB 0 CT1 27 (SUTURE) ×4
SUT VIC AB 0 CT1 27XBRD ANBCTR (SUTURE) ×1 IMPLANT
SUT VIC AB 2-0 CT1 27 (SUTURE) ×12
SUT VIC AB 2-0 CT1 TAPERPNT 27 (SUTURE) ×3 IMPLANT
SUTURE FIBERWR #2 38 T-5 BLUE (SUTURE) IMPLANT
SYR CONTROL 10ML LL (SYRINGE) IMPLANT
TOWEL OR 17X24 6PK STRL BLUE (TOWEL DISPOSABLE) ×4 IMPLANT
TOWEL OR 17X26 10 PK STRL BLUE (TOWEL DISPOSABLE) ×8 IMPLANT
TUBE CONNECTING 12'X1/4 (SUCTIONS) ×1
TUBE CONNECTING 12X1/4 (SUCTIONS) ×3 IMPLANT
WATER STERILE IRR 1000ML POUR (IV SOLUTION) ×4 IMPLANT

## 2015-07-11 NOTE — Anesthesia Procedure Notes (Addendum)
Anesthesia Regional Block:  Femoral nerve block  Pre-Anesthetic Checklist: ,, timeout performed, Correct Patient, Correct Site, Correct Laterality, Correct Procedure, Correct Position, site marked, Risks and benefits discussed,  Surgical consent,  Pre-op evaluation,  At surgeon's request and post-op pain management  Laterality: Left  Prep: chloraprep       Needles:  Injection technique: Single-shot  Needle Type: Echogenic Needle     Needle Length: 9cm 9 cm Needle Gauge: 21 and 21 G    Additional Needles:  Procedures: ultrasound guided (picture in chart) Femoral nerve block Narrative:  Start time: 07/11/2015 11:35 AM End time: 07/11/2015 11:41 AM Injection made incrementally with aspirations every 5 mL.  Performed by: Personally  Anesthesiologist: Cyndie Chime  Additional Notes: No complications noted.    Procedure Name: Intubation Date/Time: 07/11/2015 12:23 PM Performed by: Rise Patience T Pre-anesthesia Checklist: Patient identified, Emergency Drugs available, Suction available and Patient being monitored Patient Re-evaluated:Patient Re-evaluated prior to inductionOxygen Delivery Method: Circle system utilized Preoxygenation: Pre-oxygenation with 100% oxygen Intubation Type: IV induction Ventilation: Mask ventilation without difficulty and Oral airway inserted - appropriate to patient size LMA: LMA inserted LMA Size: 4.0 Laryngoscope Size: Miller and 2 Grade View: Grade I Tube type: Oral Tube size: 7.5 mm Number of attempts: 1 Airway Equipment and Method: Stylet and Oral airway Placement Confirmation: ETT inserted through vocal cords under direct vision,  positive ETCO2 and breath sounds checked- equal and bilateral Secured at: 22 cm Tube secured with: Tape Dental Injury: Teeth and Oropharynx as per pre-operative assessment  Comments: Unable to get LMA to seat well. Converted to ETT with no problems.

## 2015-07-11 NOTE — Anesthesia Preprocedure Evaluation (Addendum)
Anesthesia Evaluation  Patient identified by MRN, date of birth, ID band Patient awake    Reviewed: Allergy & Precautions, NPO status , Patient's Chart, lab work & pertinent test results, reviewed documented beta blocker date and time   Airway Mallampati: II       Dental  (+) Edentulous Upper, Edentulous Lower   Pulmonary neg pulmonary ROS,  breath sounds clear to auscultation        Cardiovascular hypertension, Pt. on medications Rhythm:Regular Rate:Normal     Neuro/Psych PSYCHIATRIC DISORDERS Anxiety negative neurological ROS     GI/Hepatic negative GI ROS, Neg liver ROS,   Endo/Other  diabetes, Type 2  Renal/GU negative Renal ROS  negative genitourinary   Musculoskeletal  (+) Arthritis -,   Abdominal (+) + obese,   Peds negative pediatric ROS (+)  Hematology negative hematology ROS (+)   Anesthesia Other Findings   Reproductive/Obstetrics                            Lab Results  Component Value Date   WBC 9.7 07/10/2015   HGB 12.6 07/10/2015   HCT 37.8 07/10/2015   MCV 86.9 07/10/2015   PLT 261 07/10/2015   Lab Results  Component Value Date   CREATININE 0.87 07/10/2015   BUN 26* 07/10/2015   NA 136 07/10/2015   K 5.1 07/10/2015   CL 100* 07/10/2015   CO2 27 07/10/2015   EKG: normal sinus rhythm.   Anesthesia Physical Anesthesia Plan  ASA: II  Anesthesia Plan: General   Post-op Pain Management: GA combined w/ Regional for post-op pain   Induction:   Airway Management Planned: LMA  Additional Equipment:   Intra-op Plan:   Post-operative Plan: Extubation in OR  Informed Consent: I have reviewed the patients History and Physical, chart, labs and discussed the procedure including the risks, benefits and alternatives for the proposed anesthesia with the patient or authorized representative who has indicated his/her understanding and acceptance.   Dental advisory  given  Plan Discussed with: CRNA  Anesthesia Plan Comments: (Anesthetic plan discussed in detail. Associated risk discussed including but not limited to life threatening cardiovascular, pulmonary events and dental damage. The postoperative pain management and antiemetic plan discussed with patient. All questions answered in detail. Patient is in agreement.   )        Anesthesia Quick Evaluation

## 2015-07-11 NOTE — H&P (Signed)
PREOPERATIVE H&P  Chief Complaint: Left patella fracture  HPI: Lindsay Cobb is a 74 y.o. female who presents for surgical treatment of Left patella fracture.  She denies any changes in medical history.  Past Medical History  Diagnosis Date  . Diabetes mellitus     takes Januvia,Metformin,and Glipizide daily  . Hyperlipidemia     takes Lovastatin nightly  . Anemia     takes Ferrous Gluconate daily  . Peripheral edema     takes Furosemide daily as needed  . Depression     takes Tofranil daily  . Hypertension     takes Amlodipine,Atenolol,and Benazepril daily  . Arthritis   . Joint pain   . Joint swelling   . Cataracts, bilateral     immature   Past Surgical History  Procedure Laterality Date  . Breast biopsy Left   . Colonoscopy    . Esophagogastroduodenoscopy    . Ercp     History   Social History  . Marital Status: Married    Spouse Name: N/A  . Number of Children: N/A  . Years of Education: N/A   Social History Main Topics  . Smoking status: Never Smoker   . Smokeless tobacco: Never Used  . Alcohol Use: No  . Drug Use: No  . Sexual Activity: Not on file   Other Topics Concern  . Not on file   Social History Narrative   Family History  Problem Relation Age of Onset  . Hypertension Mother   . Diabetes Mother   . Hypertension Father    No Known Allergies Prior to Admission medications   Medication Sig Start Date End Date Taking? Authorizing Provider  amLODipine (NORVASC) 10 MG tablet TAKE 1 TABLET EVERY DAY 03/26/15  Yes Salley Scarlet, MD  aspirin 81 MG tablet Take 81 mg by mouth daily.   Yes Historical Provider, MD  atenolol (TENORMIN) 50 MG tablet Take 1 tablet (50 mg total) by mouth daily. 03/14/14  Yes Salley Scarlet, MD  benazepril (LOTENSIN) 20 MG tablet TAKE 1 TABLET EVERY DAY 03/26/15  Yes Salley Scarlet, MD  Calcium Citrate (CITRACAL PO) Take 1 tablet by mouth daily.    Yes Historical Provider, MD  Ferrous Gluconate (IRON) 240 (27  FE) MG TABS Take 1 tablet by mouth daily.    Yes Historical Provider, MD  furosemide (LASIX) 20 MG tablet Take 1-2 tablets once a day as needed for leg swelling 04/21/15  Yes Salley Scarlet, MD  glipiZIDE (GLUCOTROL) 10 MG tablet TAKE 1 TABLET TWICE DAILY BEFORE A MEAL 02/27/15  Yes Salley Scarlet, MD  glucose blood (ACCU-CHEK AVIVA) test strip Use to monitor FSBS 1x daily. Dx: E11.9 12/31/14  Yes Salley Scarlet, MD  HYDROcodone-acetaminophen (NORCO/VICODIN) 5-325 MG per tablet Take 0.5-1 tablets by mouth every 4 (four) hours as needed for moderate pain. 07/04/15  Yes Raynelle Fanning Idol, PA-C  imipramine (TOFRANIL) 50 MG tablet Take 1 tablet (50 mg total) by mouth 3 (three) times daily. 04/21/15  Yes Salley Scarlet, MD  JANUVIA 25 MG tablet TAKE ONE TABLET BY MOUTH ONCE DAILY 06/04/15  Yes Salley Scarlet, MD  lovastatin (MEVACOR) 10 MG tablet Take 1 tablet (10 mg total) by mouth at bedtime. 05/21/14  Yes Salley Scarlet, MD  metFORMIN (GLUCOPHAGE) 1000 MG tablet TAKE 1 TABLET TWICE DAILY WITH A MEAL 04/21/15  Yes Salley Scarlet, MD     Positive ROS: All other systems have been reviewed  and were otherwise negative with the exception of those mentioned in the HPI and as above.  Physical Exam: General: Alert, no acute distress Cardiovascular: No pedal edema Respiratory: No cyanosis, no use of accessory musculature GI: abdomen soft Skin: No lesions in the area of chief complaint Neurologic: Sensation intact distally Psychiatric: Patient is competent for consent with normal mood and affect Lymphatic: no lymphedema  MUSCULOSKELETAL: exam stable  Assessment: Left patella fracture  Plan: Plan for Procedure(s): OPEN REDUCTION INTERNAL (ORIF) FIXATION LEFT PATELLA VS. LEFT PARTIAL PATELLECTOMY  The risks benefits and alternatives were discussed with the patient including but not limited to the risks of nonoperative treatment, versus surgical intervention including infection, bleeding, nerve  injury,  blood clots, cardiopulmonary complications, morbidity, mortality, among others, and they were willing to proceed.   Cheral Almas, MD   07/11/2015 7:01 AM

## 2015-07-11 NOTE — Op Note (Signed)
   Date of Surgery: 07/11/2015  INDICATIONS: Lindsay Cobb is a 74 y.o.-year-old female who sustained a left patella fracture;  The patient did consent to the procedure after discussion of the risks and benefits.  PREOPERATIVE DIAGNOSIS: Left patella fracture  POSTOPERATIVE DIAGNOSIS: Same.  PROCEDURE: Left partial patellectomy with patellar tendon advancement and repair of retinaculum  SURGEON: N. Glee Arvin, M.D.  ASSIST: Patrick Jupiter, RNFA.  ANESTHESIA:  general  IV FLUIDS AND URINE: See anesthesia.  ESTIMATED BLOOD LOSS: minimal mL.  IMPLANTS: none  DRAINS: none  COMPLICATIONS: None.  DESCRIPTION OF PROCEDURE: The patient was brought to the operating room and placed supine on the operating table.  The patient had been signed prior to the procedure and this was documented. The patient had the anesthesia placed by the anesthesiologist.  A time-out was performed to confirm that this was the correct patient, site, side and location. The patient had an SCD on the opposite lower extremity. The patient did receive antibiotics prior to the incision and was re-dosed during the procedure as needed at indicated intervals.  The patient had the operative extremity prepped and draped in the standard surgical fashion.    A midline incision was used over the knee.  Full thickness flaps were created.  The paratenon was sharply elevated off of the tendon.  The fracture was exposed.  Organized hematoma was removed.  The inferior pole of the patella was a small comminuted piece.  Therefore a partial patellectomy was performed.  We then ran 2 #5 ethibond sutures in a [place] fashion down the patellar tendon.  Three drill holes were placed in the patella and the sutures were passed using a suture passer.  With the knee in full extension, the sutures were tied down tight.  The retinaculum was then also repaired with the ethibond suture.  The wound was thoroughly irrigated and closed in a layer fashion using 0  vicryl, 2-0 vicryl, and staples.  Sterile dressings were applied.  A locked bledsoe was placed.  The patient tolerated the procedure well and was transferred to the PACU in stable condition.  POSTOPERATIVE PLAN: patient will be on aspirin daily for DVT prophylaxis.  She will be weight bearing as tolerated in the locked bledsoe brace.  Lindsay Reel, MD [facility] [phone] 1:55 PM

## 2015-07-11 NOTE — Transfer of Care (Signed)
Immediate Anesthesia Transfer of Care Note  Patient: Lindsay Cobb  Procedure(s) Performed: Procedure(s): Left Partial PATELLECTOMY (Left)  Patient Location: PACU  Anesthesia Type:General and Regional  Level of Consciousness: awake and alert   Airway & Oxygen Therapy: Patient Spontanous Breathing and Patient connected to nasal cannula oxygen  Post-op Assessment: Report given to RN, Post -op Vital signs reviewed and stable and Patient moving all extremities X 4  Post vital signs: Reviewed and stable  Last Vitals:  Filed Vitals:   07/11/15 1150  BP: 136/59  Pulse: 65  Temp:   Resp: 17    Complications: No apparent anesthesia complications

## 2015-07-11 NOTE — Anesthesia Postprocedure Evaluation (Signed)
  Anesthesia Post-op Note  Patient: Lindsay Cobb  Procedure(s) Performed: Procedure(s): Left Partial PATELLECTOMY (Left)  Patient Location: PACU  Anesthesia Type:General  Level of Consciousness: awake and alert   Airway and Oxygen Therapy: Patient Spontanous Breathing  Post-op Pain: mild  Post-op Assessment: Post-op Vital signs reviewed and Patient's Cardiovascular Status Stable              Post-op Vital Signs: Reviewed and stable  Last Vitals:  Filed Vitals:   07/11/15 1445  BP: 133/57  Pulse: 71  Temp:   Resp: 13    Complications: No apparent anesthesia complications

## 2015-07-11 NOTE — Discharge Instructions (Signed)
1. Keep knee immobilizer on at all times 2. Weight bearing as tolerated in brace at all times

## 2015-07-12 DIAGNOSIS — S82092A Other fracture of left patella, initial encounter for closed fracture: Secondary | ICD-10-CM | POA: Diagnosis not present

## 2015-07-12 LAB — GLUCOSE, CAPILLARY
GLUCOSE-CAPILLARY: 168 mg/dL — AB (ref 65–99)
Glucose-Capillary: 133 mg/dL — ABNORMAL HIGH (ref 65–99)
Glucose-Capillary: 161 mg/dL — ABNORMAL HIGH (ref 65–99)
Glucose-Capillary: 186 mg/dL — ABNORMAL HIGH (ref 65–99)

## 2015-07-12 NOTE — Evaluation (Signed)
Physical Therapy Evaluation Patient Details Name: Lindsay Cobb MRN: [id number] DOB: 16-Feb-1941 Today's Date: 07/12/2015   History of Present Illness  74 y.o. female with L patellar fx sustained in a fall. She underwent partial patellectomy 07-11-15.  Clinical Impression  Pt admitted with above diagnosis. Pt currently with functional limitations due to the deficits listed below (see PT Problem List).  Pt will benefit from skilled PT to increase their independence and safety with mobility to allow discharge to the venue listed below.  Pt's desire is to return home with HHPT. Recommending SNF on eval due to level of assist needed.     Follow Up Recommendations SNF;Supervision/Assistance - 24 hour    Equipment Recommendations  None recommended by PT    Recommendations for Other Services       Precautions / Restrictions Precautions Precautions: Fall Required Braces or Orthoses: Other Brace/Splint Other Brace/Splint: locked bledsoe brace Restrictions Weight Bearing Restrictions: Yes LLE Weight Bearing: Weight bearing as tolerated      Mobility  Bed Mobility Overal bed mobility: Needs Assistance Bed Mobility: Supine to Sit     Supine to sit: Mod assist     General bed mobility comments: Cues for sequencing and how to use hands on bed rails to pull herself toward EOB.  Transfers Overall transfer level: Needs assistance Equipment used: Rolling walker (2 wheeled) Transfers: Sit to/from Stand Sit to Stand: +2 physical assistance;Mod assist;From elevated surface         General transfer comment: Requried max verbal cues for sequencing and hand placement and physical assist for anterior lean and power up from bed.  Ambulation/Gait Ambulation/Gait assistance: Min assist;+2 safety/equipment Ambulation Distance (Feet): 5 Feet Assistive device: Rolling walker (2 wheeled) Gait Pattern/deviations: Step-to pattern;Decreased stride length;Antalgic Gait velocity: decreased    General Gait Details: close chair follow for safety  Stairs            Wheelchair Mobility    Modified Rankin (Stroke Patients Only)       Balance Overall balance assessment: Needs assistance Sitting-balance support: Feet supported;No upper extremity supported Sitting balance-Leahy Scale: Good     Standing balance support: Bilateral upper extremity supported;During functional activity Standing balance-Leahy Scale: Fair                               Pertinent Vitals/Pain Pain Assessment: 0-10 Pain Score: 2  Pain Location: L LE Pain Descriptors / Indicators: Aching Pain Intervention(s): Repositioned    Home Living Family/patient expects to be discharged to:: Private residence Living Arrangements: Spouse/significant other Available Help at Discharge: Family;Available 24 hours/day Type of Home: House Home Access: Stairs to enter Entrance Stairs-Rails: Right;Left;Can reach both Entrance Stairs-Number of Steps: 4 Home Layout: One level Home Equipment: Walker - 2 wheels;Bedside commode;Adaptive equipment      Prior Function Level of Independence: Independent               Hand Dominance        Extremity/Trunk Assessment   Upper Extremity Assessment: Overall WFL for tasks assessed           Lower Extremity Assessment: Defer to PT evaluation         Communication   Communication: HOH  Cognition Arousal/Alertness: Awake/alert Behavior During Therapy: WFL for tasks assessed/performed Overall Cognitive Status: Impaired/Different from baseline Area of Impairment: Following commands;Safety/judgement;Problem solving     Memory: Decreased short-term memory Following Commands: Follows one step commands with increased time;Follows  multi-step commands inconsistently;Follows multi-step commands with increased time     Problem Solving: Difficulty sequencing;Requires verbal cues      General Comments      Exercises         Assessment/Plan    PT Assessment Patient needs continued PT services  PT Diagnosis Difficulty walking;Acute pain   PT Problem List Decreased activity tolerance;Decreased balance;Decreased mobility;Pain;Decreased safety awareness;Decreased knowledge of use of DME  PT Treatment Interventions DME instruction;Gait training;Stair training;Functional mobility training;Therapeutic activities;Therapeutic exercise;Patient/family education;Balance training   PT Goals (Current goals can be found in the Care Plan section) Acute Rehab PT Goals Patient Stated Goal: to be able to walk PT Goal Formulation: With patient Time For Goal Achievement: 07/19/15 Potential to Achieve Goals: Good    Frequency Min 5X/week   Barriers to discharge        Co-evaluation PT/OT/SLP Co-Evaluation/Treatment: Yes Reason for Co-Treatment: For patient/therapist safety PT goals addressed during session: Mobility/safety with mobility;Proper use of DME OT goals addressed during session: ADL's and self-care (functional mobility)       End of Session Equipment Utilized During Treatment: Gait belt;Other (comment) (L locked bledsoe brace) Activity Tolerance: Patient tolerated treatment well Patient left: in chair;with family/visitor present;with call bell/phone within reach Nurse Communication: Mobility status    Functional Assessment Tool Used: clinical judgement Functional Limitation: Mobility: Walking and moving around Mobility: Walking and Moving Around Current Status (Z6109): At least 40 percent but less than 60 percent impaired, limited or restricted Mobility: Walking and Moving Around Goal Status [phone]): At least 1 percent but less than 20 percent impaired, limited or restricted    Time: 1107-1121 PT Time Calculation (min) (ACUTE ONLY): 14 min   Charges:   PT Evaluation $Initial PT Evaluation Tier I: 1 Procedure     PT G Codes:   PT G-Codes **NOT FOR INPATIENT CLASS** Functional Assessment Tool  Used: clinical judgement Functional Limitation: Mobility: Walking and moving around Mobility: Walking and Moving Around Current Status (U9811): At least 40 percent but less than 60 percent impaired, limited or restricted Mobility: Walking and Moving Around Goal Status [phone]): At least 1 percent but less than 20 percent impaired, limited or restricted    Ilda Foil 07/12/2015, 1:30 PM

## 2015-07-12 NOTE — Care Management Note (Signed)
Case Management Note  Patient Details  Name: JENIN BIRDSALL MRN: [id number] Date of Birth: May 23, 1941  Subjective/Objective:  74 yr old female admitted with left patella fracture, patient underwent ORIF   Action/Plan:  Case manager spoke with patient and her husband concerning home health and DME needs. Patient states she has a walker at home Choice offered for home health. Referral was called to [facility]      Expected Discharge Date:   07/13/15               Expected Discharge Plan:   Home with Home Health  In-House Referral:     Discharge planning Services     Post Acute Care Choice:    Choice offered to:     DME Arranged:    DME Agency:     HH Arranged:   PT HH Agency:   [facility]  Status of Service:   Completed.  Medicare Important Message Given:    Date Medicare IM Given:    Medicare IM give by:    Date Additional Medicare IM Given:    Additional Medicare Important Message give by:     If discussed at Long Length of Stay Meetings, dates discussed:    Additional Comments:  Durenda Guthrie, RN 07/12/2015, 11:00 AM

## 2015-07-12 NOTE — Progress Notes (Signed)
Occupational Therapy Evaluation Patient Details Name: Lindsay Cobb MRN: [id number] DOB: 03-09-1941 Today's Date: 07/12/2015    History of Present Illness 74 yr old female admitted with left patella fracture, patient underwent ORIF    Clinical Impression   Pt s/p left partial patellectomy.  Prior to admission, she was independent with ADLs and mobility and lives with husband. Spouse is available 24/7 but cannot provide heavy physical assist.  During eval, pt required +2 assist for functional mobility.  Currently recommending SNF to further progress rehab and improve independence and safety with ADLs prior to return home with spouse. Will continue to follow acutely to address deficits listed below.    Follow Up Recommendations  SNF;Supervision/Assistance - 24 hour    Equipment Recommendations  Tub/shower bench    Recommendations for Other Services       Precautions / Restrictions Precautions Precautions: Fall Required Braces or Orthoses:  (Bledsoe brace- locked) Restrictions Weight Bearing Restrictions: Yes LLE Weight Bearing: Weight bearing as tolerated      Mobility Bed Mobility Overal bed mobility: Needs Assistance Bed Mobility: Supine to Sit     Supine to sit: Mod assist     General bed mobility comments: Cues for sequencing and how to use hands on bed rails to pull herself toward EOB.  Transfers Overall transfer level: Needs assistance Equipment used: Rolling walker (2 wheeled) Transfers: Sit to/from Stand Sit to Stand: +2 physical assistance;Mod assist;From elevated surface         General transfer comment: Requried max verbal cues for sequencing and hand placement and physical assist for anterior lean and power up from bed.    Balance                                            ADL Overall ADL's : Needs assistance/impaired     Grooming: Wash/dry face;Set up;Supervision/safety;Sitting   Upper Body Bathing: Sitting;Supervision/  safety   Lower Body Bathing: Moderate assistance;Sitting/lateral leans   Upper Body Dressing : Supervision/safety;Sitting   Lower Body Dressing: Maximal assistance;Sitting/lateral leans   Toilet Transfer: +2 for physical assistance;Moderate assistance           Functional mobility during ADLs: +2 for physical assistance;Moderate assistance;Rolling walker;Cueing for sequencing;Cueing for safety General ADL Comments: Patient sat EOB for several minutes to talk with family and to wash face. Repeatedly asking the same questions to therapist. May be slightly confused due to meds?      Vision     Perception     Praxis      Pertinent Vitals/Pain Pain Assessment: 0-10 Pain Score: 2  Pain Location: left LE Pain Descriptors / Indicators: Aching Pain Intervention(s): Repositioned     Hand Dominance     Extremity/Trunk Assessment Upper Extremity Assessment Upper Extremity Assessment: Overall WFL for tasks assessed   Lower Extremity Assessment Lower Extremity Assessment: Defer to PT evaluation       Communication Communication Communication: HOH   Cognition Arousal/Alertness: Awake/alert Behavior During Therapy: WFL for tasks assessed/performed Overall Cognitive Status: Impaired/Different from baseline Area of Impairment: Following commands     Memory: Decreased short-term memory Following Commands: Follows one step commands with increased time;Follows multi-step commands inconsistently;Follows multi-step commands with increased time           General Comments       Exercises       Shoulder Instructions  Home Living Family/patient expects to be discharged to:: Private residence Living Arrangements: Spouse/significant other Available Help at Discharge: Family;Available 24 hours/day Type of Home: House Home Access: Stairs to enter [employer] of Steps: 4 Entrance Stairs-Rails: Right;Left;Can reach both Home Layout: One level     Bathroom  Shower/Tub: Chief Strategy Officer: Standard     Home Equipment: Environmental consultant - 2 wheels;Bedside commode;Adaptive equipment Adaptive Equipment: Reacher        Prior Functioning/Environment Level of Independence: Independent             OT Diagnosis: Generalized weakness;Acute pain   OT Problem List: Decreased strength;Decreased activity tolerance;Impaired balance (sitting and/or standing);Decreased safety awareness;Decreased knowledge of use of DME or AE;Pain   OT Treatment/Interventions: Self-care/ADL training;DME and/or AE instruction;Therapeutic activities;Balance training;Patient/family education    OT Goals(Current goals can be found in the care plan section) Acute Rehab OT Goals Patient Stated Goal: to be able to walk OT Goal Formulation: With patient/family Time For Goal Achievement: 07/26/15 Potential to Achieve Goals: Good ADL Goals Pt Will Perform Grooming: with min guard assist;standing Pt Will Perform Lower Body Bathing: with min assist;with adaptive equipment;sit to/from stand Pt Will Perform Lower Body Dressing: with min assist;with adaptive equipment;sit to/from stand Pt Will Transfer to Toilet: with supervision;ambulating;bedside commode Pt Will Perform Toileting - Clothing Manipulation and hygiene: with min guard assist;sit to/from stand  OT Frequency: Min 2X/week   Barriers to D/C: Decreased caregiver support  Husband is available 24/7 but cannot provide heavy physical assist.       Co-evaluation PT/OT/SLP Co-Evaluation/Treatment: Yes Reason for Co-Treatment: For patient/therapist safety   OT goals addressed during session: ADL's and self-care (functional mobility)      End of Session Equipment Utilized During Treatment: Gait belt;Rolling walker (left bledsoe brace) Nurse Communication: Mobility status  Activity Tolerance: Patient tolerated treatment well Patient left: in chair;with call bell/phone within reach;with family/visitor present    Time: 1610-9604 OT Time Calculation (min): 36 min Charges:  OT General Charges $OT Visit: 1 Procedure OT Evaluation $Initial OT Evaluation Tier I: 1 Procedure G-Codes: OT G-codes **NOT FOR INPATIENT CLASS** Functional Assessment Tool Used: clinical judgment Functional Limitation: Self care Self Care Current Status (V4098): At least 40 percent but less than 60 percent impaired, limited or restricted Self Care Goal Status (J1914): At least 1 percent but less than 20 percent impaired, limited or restricted  Cipriano Mile OTR/L 07/12/2015, 12:51 PM

## 2015-07-12 NOTE — Progress Notes (Signed)
Orthopedic Tech Progress Note Patient Details:  Lindsay Cobb 10/08/1941 [id number] Patient unable to use OHF Patient ID: Lindsay Cobb, female   DOB: 23-Dec-1940, 74 y.o.   MRN: [id number]   Orie Rout 07/12/2015, 12:13 AM

## 2015-07-13 DIAGNOSIS — S82092A Other fracture of left patella, initial encounter for closed fracture: Secondary | ICD-10-CM | POA: Diagnosis not present

## 2015-07-13 LAB — GLUCOSE, CAPILLARY
GLUCOSE-CAPILLARY: 217 mg/dL — AB (ref 65–99)
Glucose-Capillary: 203 mg/dL — ABNORMAL HIGH (ref 65–99)
Glucose-Capillary: 196 mg/dL — ABNORMAL HIGH (ref 65–99)
Glucose-Capillary: 176 mg/dL — ABNORMAL HIGH (ref 65–99)

## 2015-07-13 NOTE — Progress Notes (Signed)
CSW notified by nursing that Dr. Lorin Mercy does not wish to CSW to pursue SNF placement and declined to sign FL2 as he feels patient will actually do better at home with Home Health.  CSW met again with patient and husband who appeared to be very relieved and stated that they agreed with Dr. Duaine Dredge decision to allow her to return home with Home Health. Patient feels that her limited ability to ambulate was because she had only gotten out of bed 2 times.  "You need to give me a chance to get stronger and I can walk!"  Patient may require EMS transport home if her ambulation does not improve; she will benefit from maximum home health services at home to help support decision to return home.  CSW will monitor for transportation needs; otherwise will sign off.  Lorie Phenix. Pauline Good, [place]

## 2015-07-13 NOTE — Progress Notes (Signed)
Physical Therapy Treatment Patient Details Name: Lindsay Cobb MRN: [id number] DOB: December 10, 1940 Today's Date: 07/13/2015    History of Present Illness 74 y.o. female with L patellar fx sustained in a fall. She underwent partial patellectomy 07-11-15.    PT Comments    Pt progressing slowly. Gait distance limited by fatigue. Pt is at high risk for falls. In order to d/c home, she has to be able to go up 2 steps into house.  Follow Up Recommendations  SNF;Supervision/Assistance - 24 hour     Equipment Recommendations  None recommended by PT    Recommendations for Other Services       Precautions / Restrictions Precautions Precautions: Fall Required Braces or Orthoses: Other Brace/Splint Other Brace/Splint: locked bledsoe brace Restrictions LLE Weight Bearing: Weight bearing as tolerated    Mobility  Bed Mobility   Bed Mobility: Supine to Sit     Supine to sit: Mod assist     General bed mobility comments: bed flat without rails. Verbal cues for sequencing.  Transfers   Equipment used: Rolling walker (2 wheeled)   Sit to Stand: From elevated surface;Mod assist         General transfer comment: verbal cues for sequencing and hand placement  Ambulation/Gait Ambulation/Gait assistance: Min assist;+2 safety/equipment Ambulation Distance (Feet): 15 Feet Assistive device: Rolling walker (2 wheeled) Gait Pattern/deviations: Step-to pattern;Decreased stride length;Trunk flexed Gait velocity: decreased Gait velocity interpretation: Below normal speed for age/gender General Gait Details: Continuous verbal cues for posture and RW management. Close chair follow for safety.   Stairs            Wheelchair Mobility    Modified Rankin (Stroke Patients Only)       Balance                                    Cognition Arousal/Alertness: Awake/alert Behavior During Therapy: WFL for tasks assessed/performed Overall Cognitive Status:  Impaired/Different from baseline Area of Impairment: Following commands;Safety/judgement;Problem solving     Memory: Decreased short-term memory Following Commands: Follows multi-step commands inconsistently     Problem Solving: Difficulty sequencing;Requires verbal cues      Exercises      General Comments        Pertinent Vitals/Pain Pain Assessment: 0-10 Pain Score: 2  Pain Location: LLE Pain Descriptors / Indicators: Sore Pain Intervention(s): Repositioned;Monitored during session    Home Living                      Prior Function            PT Goals (current goals can now be found in the care plan section) Acute Rehab PT Goals Patient Stated Goal: to be able to walk PT Goal Formulation: With patient Time For Goal Achievement: 07/19/15 Potential to Achieve Goals: Good Progress towards PT goals: Progressing toward goals (slowly)    Frequency  Min 5X/week    PT Plan Current plan remains appropriate    Co-evaluation             End of Session Equipment Utilized During Treatment: Gait belt;Other (comment) (locked bledsoe brace) Activity Tolerance: Patient limited by fatigue Patient left: in chair;with family/visitor present;with call bell/phone within reach     Time: 4098-1191 PT Time Calculation (min) (ACUTE ONLY): 25 min  Charges:  $Gait Training: 8-22 mins $Therapeutic Activity: 8-22 mins  G Codes:  Functional Assessment Tool Used: clinical judgement Functional Limitation: Mobility: Walking and moving around Mobility: Walking and Moving Around Current Status [phone]): At least 40 percent but less than 60 percent impaired, limited or restricted Mobility: Walking and Moving Around Goal Status [phone]): At least 1 percent but less than 20 percent impaired, limited or restricted   Ilda Foil 07/13/2015, 10:33 AM

## 2015-07-13 NOTE — Progress Notes (Signed)
Patient ID: Lindsay Cobb, female   DOB: November 16, 1941, 74 y.o.   MRN: [id number] Therapy states not safe for home yet. Continue PT. With her procedure, walked in the hall , used a treadmill at home, I think she will be able to go home in a day or 2. Do not expect SNF needed.

## 2015-07-13 NOTE — Progress Notes (Signed)
Occupational Therapy Treatment Patient Details Name: Lindsay Cobb MRN: [id number] DOB: July 26, 1941 Today's Date: 07/13/2015    History of present illness 74 y.o. female with L patellar fx sustained in a fall. She underwent partial patellectomy 07-11-15.   OT comments  Patient is not making good progress towards goals, progress is very slow. Patient required max assist +2 to stand from recliner. Therapist verbalized commands to make this sit<>stand transition easier and safety, however patient unable to follow simple commands. Patient's husband present and assisted therapist, do not feel he is a safe resource for transfers at this time. Patient states that her husband will be the one at home with her. Will continue plan of care for now. Continue to recommend ST SNF for safety post acute d/c.    Follow Up Recommendations  SNF;Supervision/Assistance - 24 hour    Equipment Recommendations  Tub/shower bench (Pt reports she has a 3-in-1)    Recommendations for Other Services  None at this time  Precautions / Restrictions Precautions Precautions: Fall Required Braces or Orthoses: Other Brace/Splint Other Brace/Splint: locked bledsoe brace Restrictions Weight Bearing Restrictions: Yes LLE Weight Bearing: Weight bearing as tolerated    Mobility Bed Mobility - Per PT note   Bed Mobility: Supine to Sit     Supine to sit: Mod assist     General bed mobility comments: bed flat without rails. Verbal cues for sequencing.  Transfers Overall transfer level: Needs assistance Equipment used: Rolling walker (2 wheeled) Transfers: Sit to/from Stand Sit to Stand: Max assist;+2 physical assistance (from recliner)  General transfer comment: Pt required increased assistance for sit<>stand from recliner. Repetitive cues needed for hand placement, sequencing, technique    Balance Overall balance assessment: Needs assistance Sitting-balance support: No upper extremity supported;Feet  supported Sitting balance-Leahy Scale: Fair     Standing balance support: Bilateral upper extremity supported;During functional activity Standing balance-Leahy Scale: Poor   ADL Overall ADL's : Needs assistance/impaired General ADL Comments: Pt continues to demonstrate a decrease in cognition, pt following one-step commands inconsistently. Pt found seated in recliner with husband present. Therapist encouraged sit<>stand using RW from recliner in attempt to perform [facility] transfer. Pt stood from recliner with max assist (+2 for phsical assistance). Therapist asked patient to bring RLE under her a little more for better support, pt unable to follow this command. Pt sat back down due to fatigue. Pt then stood for second time with max assist +2. Did not attempt BSC transfer due to safety concerns. Will need skilled assistance to perform transfers, husband is not safely capable to assist at this time.      Cognition   Behavior During Therapy: WFL for tasks assessed/performed Overall Cognitive Status: Impaired/Different from baseline Area of Impairment: Following commands;Safety/judgement;Problem solving     Memory: Decreased short-term memory  Following Commands: Follows one step commands with increased time;Follows one step commands inconsistently Safety/Judgement: Decreased awareness of safety;Decreased awareness of deficits   Problem Solving: Difficulty sequencing;Requires verbal cues                   Pertinent Vitals/ Pain       Pain Assessment: Faces Pain Score: 2  Faces Pain Scale: Hurts little more Pain Location: LLE Pain Descriptors / Indicators: Grimacing;Guarding Pain Intervention(s): Monitored during session;Repositioned   Frequency Min 2X/week     Progress Toward Goals  OT Goals(current goals can now befound in the care plan section)  Progress towards OT goals: Not progressing toward goals - comment (Pt requires increased  assistance with transfers today)  Acute  Rehab OT Goals Patient Stated Goal: to be able to walk  Plan Discharge plan remains appropriate    End of Session Equipment Utilized During Treatment: Gait belt;Rolling walker;Other (comment) (left bledsoe brace)   Activity Tolerance Patient tolerated treatment well   Patient Left in chair;with call bell/phone within reach;with family/visitor present  Nurse Communication Mobility status     Time: 1250-1316 OT Time Calculation (min): 26 min  Charges: OT General Charges $OT Visit: 1 Procedure OT Treatments $Self Care/Home Management : 23-37 mins  Andora Krull , MS, OTR/L, CLT Pager: [phone]  07/13/2015, 1:26 PM

## 2015-07-13 NOTE — Progress Notes (Signed)
Subjective: 2 Days Post-Op Procedure(s) (LRB): Left Partial PATELLECTOMY (Left) Patient reports pain as mild.    Objective: Vital signs in last 24 hours: Temp:  [97.8 F (36.6 C)-98.4 F (36.9 C)] 98.3 F (36.8 C) (08/07 0510) Pulse Rate:  [72-90] 90 (08/07 0510) Resp:  [18] 18 (08/07 0510) BP: (134-136)/(50-68) 135/56 mmHg (08/07 0510) SpO2:  [99 %-100 %] 100 % (08/07 0510)  Intake/Output from previous day: 08/06 0701 - 08/07 0700 In: 720 [P.O.:720] Out: -  Intake/Output this shift:     Recent Labs  07/10/15 1353  HGB 12.6    Recent Labs  07/10/15 1353  WBC 9.7  RBC 4.35  HCT 37.8  PLT 261    Recent Labs  07/10/15 1353  NA 136  K 5.1  CL 100*  CO2 27  BUN 26*  CREATININE 0.87  GLUCOSE 135*  CALCIUM 9.7   No results for input(s): LABPT, INR in the last 72 hours.  Neurologically intact  Assessment/Plan: 2 Days Post-Op Procedure(s) (LRB): Left Partial PATELLECTOMY (Left) Up with therapy  likely home today.  Lindsay Cobb C 07/13/2015, 10:20 AM

## 2015-07-13 NOTE — Clinical Social Work Note (Signed)
Clinical Social Work Assessment  Patient Details  Name: Lindsay Cobb MRN: [id number] Date of Birth: December 17, 1940  Date of referral:  07/13/15               Reason for consult:  Facility Placement                Permission sought to share information with:  Family Supports, Chartered certified accountant granted to share information::  Yes, Verbal Permission Granted  Name::     [facility] [place], [place] SNF's , Husband- Manufacturing engineer::     Relationship::     Contact Information:     Housing/Transportation Living arrangements for the past 2 months:  [place] of Information:  Patient, Spouse Patient Interpreter Needed:  None Criminal Activity/Legal Involvement Pertinent to Current Situation/Hospitalization:  No - Comment as needed Significant Relationships:  Spouse Lives with:  Spouse Do you feel safe going back to the place where you live?  Yes Need for family participation in patient care:  Yes (Comment)  Care giving concerns: Lives at home with her husband Lindsay Cobb. She is aware of PT's recommendation for SNF but is very anxious about leaving husband at home alone.   Social Worker assessment / plan:  CSW met with patient and husband Lindsay Cobb this afternoon after completion of PT evaluation in which short term SNF recommended for rehab as patient had great difficulty in ambulating with Physical Therapy. CSW discussed SNF process and although reluctant- patient agreed to seek SNF care. She remains very worried about leaving her husband at home alone.  CSW discussed their concerns- husband states that he is independent of his ADL's.  Patient worries about his "driving" if she is not there to remind him to "slow down".  CSW feels that there may be more concerns than that but patient would not disclose.  She did indicate that the do not have any children, neices or nephews.  She has one sister but she does not live close by.  Patient and husband have a  [place] address but state that they actually live closer to [place].  Discussed possible rehab at [facility] and they were pleased with this option.  FL2 initiated for MD's signature and will begin SNF search.  Employment status:  Retired Nurse, adult PT Recommendations:  [place] / Referral to community resources:  [place]  Patient/Family's Response to care:  Patient agrees reluctantly to SNF placement but continued to ask for clarification from CSW re: short term SNF vs home health.   Patient/Family's Understanding of and Emotional Response to Diagnosis, Current Treatment, and Prognosis:   Patient is alert, calm and very pleasant. She was relaxed when discussing SNF placement- however appeared perplexed at times as to the actual significance of rehab placement.  She states that she has difficulty walking and that she is concerned how she will manage at home but then will ask why she cannot return home with home health.  Husband also appears to be concerned about her entering a SNF.     Emotional Assessment Appearance:  Appears stated age Attitude/Demeanor/Rapport:  Apprehensive Affect (typically observed):  Apprehensive, Calm, Quiet, Pleasant Orientation:  Oriented to Self, Oriented to Place, Oriented to  Time, Oriented to Situation Alcohol / Substance use:  Never Used Psych involvement (Current and /or in the community):  No (Comment)  Discharge Needs  Concerns to be addressed:  Care Coordination Readmission  within the last 30 days:   No Current discharge risk:  None Barriers to Discharge:  No Barriers Identified   Estill Bakes 07/13/2015, 7:36 PM

## 2015-07-14 ENCOUNTER — Telehealth: Payer: Self-pay | Admitting: *Deleted

## 2015-07-14 ENCOUNTER — Encounter (HOSPITAL_COMMUNITY): Payer: Self-pay | Admitting: Orthopaedic Surgery

## 2015-07-14 DIAGNOSIS — S82092A Other fracture of left patella, initial encounter for closed fracture: Secondary | ICD-10-CM | POA: Diagnosis not present

## 2015-07-14 LAB — GLUCOSE, CAPILLARY
Glucose-Capillary: 192 mg/dL — ABNORMAL HIGH (ref 65–99)
Glucose-Capillary: 213 mg/dL — ABNORMAL HIGH (ref 65–99)

## 2015-07-14 NOTE — Clinical Social Work Placement (Signed)
   CLINICAL SOCIAL WORK PLACEMENT  NOTE  Date:  07/14/2015  Patient Details  Name: Lindsay Cobb MRN: [id number] Date of Birth: Aug 18, 1941  Clinical Social Work is seeking post-discharge placement for this patient at the Skilled  Nursing Facility level of care (*CSW will initial, date and re-position this form in  chart as items are completed):  Yes   Patient/family provided with [facility] Clinical Social Work Department's list of facilities offering this level of care within the geographic area requested by the patient (or if unable, by the patient's family).  Yes   Patient/family informed of their freedom to choose among providers that offer the needed level of care, that participate in Medicare, Medicaid or managed care program needed by the patient, have an available bed and are willing to accept the patient.  Yes   Patient/family informed of [facility]'s ownership interest in [facility] and [facility], as well as of the fact that they are under no obligation to receive care at these facilities.  PASRR submitted to EDS on 07/14/15     PASRR number received on 07/14/15     Existing PASRR number confirmed on       FL2 transmitted to all facilities in geographic area requested by pt/family on 07/14/15     FL2 transmitted to all facilities within larger geographic area on       Patient informed that his/her managed care company has contracts with or will negotiate with certain facilities, including the following:        Yes   Patient/family informed of bed offers received.  Patient [facility] bed at [facility]     Physician recommends and patient chooses bed at  (none)    Patient to be transferred to [facility] on 07/14/15.  Patient to be transferred to facility by       Patient family notified on 07/14/15 of transfer.  Name of family member notified:  Alex husband     PHYSICIAN Please prepare priority discharge summary, including  medications     Additional Comment:    _______________________________________________ Rondel Baton, LCSW 07/14/2015, 2:53 PM

## 2015-07-14 NOTE — Discharge Planning (Signed)
Patient will discharge today per MD order. Patient will discharge to: [facility] RN to call report prior to transportation to: [phone] Transportation: scheduled at [employer] authorization received [id number]. Permission from SNF received for transport.  CSW sent discharge summary to SNF for review.  Packet is complete.  RN, patient and family aware of discharge plans.  Vickii Penna, LCSWA [phone]  Psychiatric & Orthopedics (5N 1-16) Clinical Social Worker

## 2015-07-14 NOTE — Discharge Summary (Signed)
Physician Discharge Summary      Patient ID: Lindsay Cobb MRN: [id number] DOB/AGE: Oct 10, 1941 74 y.o.  Admit date: 07/11/2015 Discharge date: 07/14/2015  Admission Diagnoses:  <principal problem not specified>  Discharge Diagnoses:  Active Problems:   S/P ORIF (open reduction internal fixation) fracture   Past Medical History  Diagnosis Date  . Diabetes mellitus     takes Januvia,Metformin,and Glipizide daily  . Hyperlipidemia     takes Lovastatin nightly  . Anemia     takes Ferrous Gluconate daily  . Peripheral edema     takes Furosemide daily as needed  . Depression     takes Tofranil daily  . Hypertension     takes Amlodipine,Atenolol,and Benazepril daily  . Arthritis   . Joint pain   . Joint swelling   . Cataracts, bilateral     immature    Surgeries: Procedure(s): Left Partial PATELLECTOMY on 07/11/2015   Consultants (if any):    Discharged Condition: Improved  Hospital Course: Lindsay Cobb is an 74 y.o. female who was admitted 07/11/2015 with a diagnosis of <principal problem not specified> and went to the operating room on 07/11/2015 and underwent the above named procedures.    She was given perioperative antibiotics:  Anti-infectives    Start     Dose/Rate Route Frequency Ordered Stop   07/11/15 1615  ceFAZolin (ANCEF) IVPB 2 g/50 mL premix     2 g 100 mL/hr over 30 Minutes Intravenous Every 6 hours 07/11/15 1603 07/12/15 0520   07/11/15 1145  ceFAZolin (ANCEF) IVPB 2 g/50 mL premix     2 g 100 mL/hr over 30 Minutes Intravenous On call to O.R. 07/11/15 4098 07/11/15 1230    .  She was given sequential compression devices, early ambulation, and aspirin for DVT prophylaxis.  She benefited maximally from the hospital stay and there were no complications.    Recent vital signs:  Filed Vitals:   07/14/15 0640  BP: 139/51  Pulse: 97  Temp: 98.7 F (37.1 C)  Resp: 16    Recent laboratory studies:  Lab Results  Component Value Date   HGB 12.6  07/10/2015   HGB 12.9 04/21/2015   HGB 12.4 09/20/2014   Lab Results  Component Value Date   WBC 9.7 07/10/2015   PLT 261 07/10/2015   No results found for: INR Lab Results  Component Value Date   NA 136 07/10/2015   K 5.1 07/10/2015   CL 100* 07/10/2015   CO2 27 07/10/2015   BUN 26* 07/10/2015   CREATININE 0.87 07/10/2015   GLUCOSE 135* 07/10/2015    Discharge Medications:     Medication List    STOP taking these medications        aspirin 81 MG tablet  Replaced by:  aspirin EC 325 MG tablet      TAKE these medications        amLODipine 10 MG tablet  Commonly known as:  NORVASC  TAKE 1 TABLET EVERY DAY     aspirin EC 325 MG tablet  Take 1 tablet (325 mg total) by mouth daily.     atenolol 50 MG tablet  Commonly known as:  TENORMIN  Take 1 tablet (50 mg total) by mouth daily.     benazepril 20 MG tablet  Commonly known as:  LOTENSIN  TAKE 1 TABLET EVERY DAY     CITRACAL PO  Take 1 tablet by mouth daily.     furosemide 20 MG tablet  Commonly  known as:  LASIX  Take 1-2 tablets once a day as needed for leg swelling     glipiZIDE 10 MG tablet  Commonly known as:  GLUCOTROL  TAKE 1 TABLET TWICE DAILY BEFORE A MEAL     glucose blood test strip  Commonly known as:  ACCU-CHEK AVIVA  Use to monitor FSBS 1x daily. Dx: E11.9     HYDROcodone-acetaminophen 5-325 MG per tablet  Commonly known as:  NORCO/VICODIN  Take 0.5-1 tablets by mouth every 4 (four) hours as needed for moderate pain.     imipramine 50 MG tablet  Commonly known as:  TOFRANIL  Take 1 tablet (50 mg total) by mouth 3 (three) times daily.     Iron 240 (27 FE) MG Tabs  Take 1 tablet by mouth daily.     JANUVIA 25 MG tablet  Generic drug:  sitaGLIPtin  TAKE ONE TABLET BY MOUTH ONCE DAILY     lovastatin 10 MG tablet  Commonly known as:  MEVACOR  Take 1 tablet (10 mg total) by mouth at bedtime.     metFORMIN 1000 MG tablet  Commonly known as:  GLUCOPHAGE  TAKE 1 TABLET TWICE DAILY  WITH A MEAL     oxyCODONE 5 MG immediate release tablet  Commonly known as:  Oxy IR/ROXICODONE  Take 1-3 tablets (5-15 mg total) by mouth every 4 (four) hours as needed.        Diagnostic Studies: Dg Knee Complete 4 Views Left  07/04/2015   CLINICAL DATA:  Left knee pain with abrasions and bruising to patellar area. Difficulty bearing weight. Fall today.  EXAM: LEFT KNEE - COMPLETE 4+ VIEW  COMPARISON:  None.  FINDINGS: There is a fracture through the inferior pole of the patella. Inferior fragment is displaced inferiorly approximately 14 mm. No additional acute bony abnormality. No subluxation or dislocation. Mild chondrocalcinosis.  IMPRESSION: Displaced fracture through the inferior pole of the left patella.   Electronically Signed   By: Charlett Nose M.D.   On: 07/04/2015 16:12    Disposition: 01-Home or Self Care        Follow-up Information    Follow up with Cheral Almas, MD In 2 weeks.   Specialty:  Orthopedic Surgery   Why:  For suture removal, For wound re-check   Contact information:   [address] [place] [address] [phone]       Follow up with [facility].   Why:  Someone from [facility] will contact you concerning start date and time for therapy.   Contact information:   [address] [place] [address] [phone]       Follow up with Cheral Almas, MD In 1 week.   Specialty:  Orthopedic Surgery   Contact information:   [address] [place] [address] [phone]        Signed: Cheral Almas 07/14/2015, 12:25 PM

## 2015-07-14 NOTE — Progress Notes (Signed)
Patient unable to assist in rolling from side to side in bed. RN and NT repositioned patient several times throughout the night for comfort measures. Patient is a 2 assist from recliner to bedside commode to bed. Patients husband unable to safely assist wife however he provides great emotional support for the patient. Nursing will continue to monitor.

## 2015-07-14 NOTE — Progress Notes (Signed)
Attempted to call report x2 to [facility] facility

## 2015-07-14 NOTE — Telephone Encounter (Signed)
Submitted humana referral thru acuity connect for authorization on 07/08/15 to Dr. [name] ortho with authorization number [id number]  Requesting provider: Alpha Gula  Treating provider: Gershon Mussel, MD  Number of visits:6  Start Date: 0/02/16  End Date:12/25/2015  Dx:M25.562-Pain in left knee  Copy has been faxed to [facility] ortho for review/record

## 2015-07-14 NOTE — Progress Notes (Signed)
Occupational Therapy Treatment Patient Details Name: Lindsay Cobb MRN: [id number] DOB: 1941/09/03 Today's Date: 07/14/2015    History of present illness 74 y.o. female with L patellar fx sustained in a fall. She underwent partial patellectomy 07-11-15.   OT comments  Patient making better progress towards goals. Pt does great using STEDY for safe and effective functional transfers. Used STEDY therapeutically and had patient do as much as she physically could, STEDY safer than using RW especially for nursing staff.    Follow Up Recommendations  SNF;Supervision/Assistance - 24 hour    Equipment Recommendations  Other (comment) (TBD next venue of care)    Recommendations for Other Services  None at this time  Precautions / Restrictions Precautions Precautions: Fall Required Braces or Orthoses: Other Brace/Splint Other Brace/Splint: locked bledsoe brace Restrictions Weight Bearing Restrictions: Yes LLE Weight Bearing: Weight bearing as tolerated    Mobility Bed Mobility Overal bed mobility: Needs Assistance Bed Mobility: Supine to Sit;Sit to Supine     Supine to sit: Mod assist Sit to supine: Mod assist   General bed mobility comments: mod assist for LE management and trunk support. Cues for sequencing and initiation.   Transfers Overall transfer level: Needs assistance Equipment used: Rolling walker (2 wheeled) Transfers: Sit to/from Stand Sit to Stand: Mod assist;+2 physical assistance;From elevated surface  General transfer comment: STEDY used for safety. Cues for sequencing and technique.     Balance Overall balance assessment: Needs assistance Sitting-balance support: No upper extremity supported;Feet supported Sitting balance-Leahy Scale: Fair     Standing balance support: During functional activity Standing balance-Leahy Scale: Poor   A DL Overall ADL's : Needs assistance/impaired General ADL Comments: Pt found supine in bed. Pt eager to get OOB. Assisted  patient to EOB, then pt stood using STEDY and transferred > BSC. +2 help needed (NT assisting). Nurse came into room and stated patient to discharge this afternoon, therefore after toileting therapist assisted patient back to bed.      Cognition   Behavior During Therapy: WFL for tasks assessed/performed Overall Cognitive Status: Impaired/Different from baseline Area of Impairment: Following commands;Safety/judgement;Problem solving     Memory: Decreased short-term memory;Decreased recall of precautions  Following Commands: Follows one step commands with increased time;Follows one step commands inconsistently Safety/Judgement: Decreased awareness of safety;Decreased awareness of deficits   Problem Solving: Difficulty sequencing;Requires verbal cues;Decreased initiation                   Pertinent Vitals/ Pain       Pain Assessment: No/denies pain         Frequency Min 2X/week     Progress Toward Goals  OT Goals(current goals can now be found in the care plan section)  Progress towards OT goals: Progressing toward goals     Plan Discharge plan remains appropriate    End of Session Equipment Utilized During Treatment: Gait belt;Other (comment) (bledsoe brace and STEDY)   Activity Tolerance Patient tolerated treatment well   Patient Left with call bell/phone within reach;with family/visitor present;in bed (seated EOB with husband and NT present)   Time: 4098-1191 OT Time Calculation (min): 27 min  Charges: OT General Charges $OT Visit: 1 Procedure OT Treatments $Self Care/Home Management : 23-37 mins  Marionna Gonia , MS, OTR/L, CLT Pager: [phone]  07/14/2015, 3:58 PM

## 2015-07-17 ENCOUNTER — Telehealth: Payer: Self-pay | Admitting: *Deleted

## 2015-07-17 NOTE — Telephone Encounter (Signed)
Received fax from [facility] care management Dept of [facility] on 07/14/15 stating that pt has been admitted to acute care hospital  Hospital: [facility]  Admit Date: 07/11/15  Dx: Novella Rob fo patella  Admitting physician: Etter Sjogren Xu,MD  Pending authorization:[id number]

## 2015-07-17 NOTE — Telephone Encounter (Signed)
Received fax from [facility] Discharge Dispostion on 07/17/15 stating pt has been discharged from hospital  Date of admit:07/11/15  Date of discharge: 07/14/15  Level of Care:Inpatient Acute Medical Care  Discharging facility:[facility]  Attending physician:Dr. Genevie Ann  PCP: Kingsley Spittle Bensley,MD  Discharge Dispostion: Discharged/transferred to skilled nursing facility with Medicare Certification  DX:S82.0-fracture of patella

## 2015-08-06 ENCOUNTER — Telehealth: Payer: Self-pay | Admitting: *Deleted

## 2015-08-06 ENCOUNTER — Inpatient Hospital Stay: Payer: Medicare Other | Admitting: Family Medicine

## 2015-08-06 NOTE — Telephone Encounter (Signed)
Submitted humana referral thru acuity connect for authorization to Dr. Berna Spare Duda,MD with authorization [id number]  Requesting provider: Jaquelyn Cobb  Treating provider: Mordecai Maes  Number of visits:6  Start Date: 08/05/15  End Date:01/31/15  Dx: J47.82- Encounter for suture removal

## 2015-08-13 ENCOUNTER — Encounter (HOSPITAL_COMMUNITY): Payer: Self-pay | Admitting: Emergency Medicine

## 2015-08-13 ENCOUNTER — Emergency Department (HOSPITAL_COMMUNITY)
Admission: EM | Admit: 2015-08-13 | Discharge: 2015-08-14 | Disposition: A | Discharge status: Home / Self Care | Payer: Commercial Managed Care - HMO | Source: Home / Self Care | Attending: Emergency Medicine | Admitting: Emergency Medicine

## 2015-08-13 DIAGNOSIS — T814XXA Infection following a procedure, initial encounter: Secondary | ICD-10-CM | POA: Diagnosis not present

## 2015-08-13 DIAGNOSIS — T798XXA Other early complications of trauma, initial encounter: Secondary | ICD-10-CM

## 2015-08-13 MED ORDER — CLINDAMYCIN PHOSPHATE 900 MG/50ML IV SOLN
900.0000 mg | Freq: Once | INTRAVENOUS | Status: AC
  Administered 2015-08-14: 900 mg via INTRAVENOUS
  Filled 2015-08-13: qty 50

## 2015-08-13 NOTE — ED Provider Notes (Signed)
CSN: [id number]   Arrival date & time 08/13/15 2155  History  This chart was scribed for Lindsay Lyons, MD by Lindsay Cobb, ED Scribe. This patient was seen in room APA14/APA14 and the patient's care was started at 11:21 PM.  Chief Complaint  Patient presents with  . Knee Pain    HPI The history is provided by the patient and the spouse. No language interpreter was used.   Lindsay Cobb is a 74 y.o. female who presents to the Emergency Department complaining of increasing drainage from an incision site on the left knee with onset yesterday. Pt had a left knee patellectomy by  Dr. [name] 2 weeks ago. The stitches were removed earlier this week. She denies increased left knee pain and fever. She has a f/u appointment in 6 days and is unable to see Dr. [name] sooner because he is out of town. No antibiotic use. NKDA.  Past Medical History  Diagnosis Date  . Diabetes mellitus     takes Januvia,Metformin,and Glipizide daily  . Hyperlipidemia     takes Lovastatin nightly  . Anemia     takes Ferrous Gluconate daily  . Peripheral edema     takes Furosemide daily as needed  . Depression     takes Tofranil daily  . Hypertension     takes Amlodipine,Atenolol,and Benazepril daily  . Arthritis   . Joint pain   . Joint swelling   . Cataracts, bilateral     immature    Past Surgical History  Procedure Laterality Date  . Breast biopsy Left   . Colonoscopy    . Esophagogastroduodenoscopy    . Ercp    . Patellectomy Left 07/11/2015    Procedure: Left Partial PATELLECTOMY;  Surgeon: Tarry Kos, MD;  Location: [facility] OR;  Service: Orthopedics;  Laterality: Left;    Family History  Problem Relation Age of Onset  . Hypertension Mother   . Diabetes Mother   . Hypertension Father     Social History  Substance Use Topics  . Smoking status: Never Smoker   . Smokeless tobacco: Never Used  . Alcohol Use: No     Review of Systems 10 Systems reviewed and all are negative for acute change except as  noted in the HPI. Home Medications   Prior to Admission medications   Medication Sig Start Date End Date Taking? Authorizing Provider  amLODipine (NORVASC) 10 MG tablet TAKE 1 TABLET EVERY DAY 03/26/15   Salley Scarlet, MD  aspirin EC 325 MG tablet Take 1 tablet (325 mg total) by mouth daily. 07/11/15   Tarry Kos, MD  atenolol (TENORMIN) 50 MG tablet Take 1 tablet (50 mg total) by mouth daily. 03/14/14   Salley Scarlet, MD  benazepril (LOTENSIN) 20 MG tablet TAKE 1 TABLET EVERY DAY 03/26/15   Salley Scarlet, MD  Calcium Citrate (CITRACAL PO) Take 1 tablet by mouth daily.     Historical Provider, MD  Ferrous Gluconate (IRON) 240 (27 FE) MG TABS Take 1 tablet by mouth daily.     Historical Provider, MD  furosemide (LASIX) 20 MG tablet Take 1-2 tablets once a day as needed for leg swelling 04/21/15   Salley Scarlet, MD  glipiZIDE (GLUCOTROL) 10 MG tablet TAKE 1 TABLET TWICE DAILY BEFORE A MEAL 02/27/15   Salley Scarlet, MD  glucose blood (ACCU-CHEK AVIVA) test strip Use to monitor FSBS 1x daily. Dx: E11.9 12/31/14   Salley Scarlet, MD  HYDROcodone-acetaminophen (NORCO/VICODIN) [phone]  MG per tablet Take 0.5-1 tablets by mouth every 4 (four) hours as needed for moderate pain. 07/04/15   Burgess Amor, PA-C  imipramine (TOFRANIL) 50 MG tablet Take 1 tablet (50 mg total) by mouth 3 (three) times daily. 04/21/15   Salley Scarlet, MD  JANUVIA 25 MG tablet TAKE ONE TABLET BY MOUTH ONCE DAILY 06/04/15   Salley Scarlet, MD  lovastatin (MEVACOR) 10 MG tablet Take 1 tablet (10 mg total) by mouth at bedtime. 05/21/14   Salley Scarlet, MD  metFORMIN (GLUCOPHAGE) 1000 MG tablet TAKE 1 TABLET TWICE DAILY WITH A MEAL 04/21/15   Salley Scarlet, MD  oxyCODONE (OXY IR/ROXICODONE) 5 MG immediate release tablet Take 1-3 tablets (5-15 mg total) by mouth every 4 (four) hours as needed. 07/11/15   Tarry Kos, MD    Allergies  Review of patient's allergies indicates no known allergies.  Triage Vitals: BP 130/50  mmHg  Pulse 95  Temp(Src) 98.1 F (36.7 C) (Oral)  Resp 20  Ht  (1.778 m)  Wt 255 lb (115.667 kg)  BMI 36.59 kg/m2  SpO2 100%  Physical Exam  Constitutional: She is oriented to person, place, and time. She appears well-developed and well-nourished.  HENT:  Head: Normocephalic.  Eyes: EOM are normal.  Neck: Normal range of motion.  Pulmonary/Chest: Effort normal.  Abdominal: She exhibits no distension.  Musculoskeletal:  The left knee has post-surgical scar running longitudinally. Overlying the patella, there is increased erythema, warmth, and 2 areas of drainage of purulent material.   Neurological: She is alert and oriented to person, place, and time.  Psychiatric: She has a normal mood and affect.  Nursing note and vitals reviewed.   ED Course  Procedures   DIAGNOSTIC STUDIES: Oxygen Saturation is 100% on RA, normal by my interpretation.    COORDINATION OF CARE: 11:25 PM Discussed treatment plan which includes lab work and abx with pt at bedside and pt agreed to plan.  11:46 PM-Consult complete with the on-call provider for [facility]. Patient case explained and discussed.Call ended at 11:48 PM   Labs Reviewed  WOUND CULTURE  BASIC METABOLIC PANEL  CBC WITH DIFFERENTIAL/PLATELET    Imaging Review No results found.        MDM   Final diagnoses:  None     Laboratory studies reveal a slight elevation of white count, however she is afebrile and nontoxic appearing. I've discussed the case with the on-call provider for [facility] who is covering for Dr. [name].  It was his recommendation to give clindamycin intravenously and have the patient follow-up in the office tomorrow morning. She was advised to call the office at opening to obtain an appointment time.   I personally performed the services described in this documentation, which was scribed in my presence. The recorded information has been reviewed and is accurate.     Lindsay Lyons, MD 08/14/15 [id number]

## 2015-08-13 NOTE — ED Notes (Signed)
Patient reports she recently had left knee surgery about 2 weeks ago. States she is having a large amount of drainage from wound and is concerned for infection.

## 2015-08-14 ENCOUNTER — Other Ambulatory Visit (HOSPITAL_COMMUNITY): Payer: Self-pay | Admitting: Orthopaedic Surgery

## 2015-08-14 ENCOUNTER — Encounter (HOSPITAL_COMMUNITY): Payer: Self-pay | Admitting: *Deleted

## 2015-08-14 LAB — CBC WITH DIFFERENTIAL/PLATELET
BASOS ABS: 0 10*3/uL (ref 0.0–0.1)
BASOS PCT: 0 % (ref 0–1)
EOS PCT: 1 % (ref 0–5)
Eosinophils Absolute: 0.1 10*3/uL (ref 0.0–0.7)
HCT: 27.7 % — ABNORMAL LOW (ref 36.0–46.0)
Hemoglobin: 9.5 g/dL — ABNORMAL LOW (ref 12.0–15.0)
LYMPHS PCT: 11 % — AB (ref 12–46)
Lymphs Abs: 1.3 10*3/uL (ref 0.7–4.0)
MCH: 28.4 pg (ref 26.0–34.0)
MCHC: 34.3 g/dL (ref 30.0–36.0)
MCV: 82.7 fL (ref 78.0–100.0)
MONO ABS: 1.3 10*3/uL — AB (ref 0.1–1.0)
Monocytes Relative: 11 % (ref 3–12)
NEUTROS ABS: 8.9 10*3/uL — AB (ref 1.7–7.7)
Neutrophils Relative %: 77 % (ref 43–77)
PLATELETS: 339 10*3/uL (ref 150–400)
RBC: 3.35 MIL/uL — AB (ref 3.87–5.11)
RDW: 14.9 % (ref 11.5–15.5)
WBC: 11.6 10*3/uL — AB (ref 4.0–10.5)

## 2015-08-14 LAB — BASIC METABOLIC PANEL
ANION GAP: 10 (ref 5–15)
BUN: 20 mg/dL (ref 6–20)
CALCIUM: 8.8 mg/dL — AB (ref 8.9–10.3)
CO2: 26 mmol/L (ref 22–32)
Chloride: 98 mmol/L — ABNORMAL LOW (ref 101–111)
Creatinine, Ser: 0.78 mg/dL (ref 0.44–1.00)
GFR calc Af Amer: >60 mL/min (ref >60)
Glucose, Bld: 215 mg/dL — ABNORMAL HIGH (ref 65–99)
POTASSIUM: 4.2 mmol/L (ref 3.5–5.1)
SODIUM: 134 mmol/L — AB (ref 135–145)

## 2015-08-14 MED ORDER — CLINDAMYCIN HCL 300 MG PO CAPS: 300.0000 mg | ORAL_CAPSULE | Freq: Four times a day (QID) | ORAL | Status: DC

## 2015-08-14 NOTE — Progress Notes (Signed)
Pt denies SOB, chest pain, and being under the care of a cardiologist. Pt denies having a stress test, echo and cardiac cath. Pt made aware to stop taking Aspirin, otc vitamins and herbal medications ( Remifemin) . Do not take any NSAIDs ie: Ibuprofen, Advil, Naproxen or any medication containing Aspirin. Pt stated that MD advised that she can eat a light breakfast but nothing after 7 AM. Pt verbalized understanding of all pre-op instructions.

## 2015-08-14 NOTE — Discharge Instructions (Signed)
Clindamycin as prescribed.  You are to see Dr. [name] this morning in the office. Please call to arrange this appointment when the office opens.   Wound Infection A wound infection happens when a type of germ (bacteria) starts growing in the wound. In some cases, this can cause the wound to break open. If cared for properly, the infected wound will heal from the inside to the outside. Wound infections need treatment. CAUSES An infection is caused by bacteria growing in the wound.  SYMPTOMS   Increase in redness, swelling, or pain at the wound site.  Increase in drainage at the wound site.  Wound or bandage (dressing) starts to smell bad.  Fever.  Feeling tired or fatigued.  Pus draining from the wound. TREATMENT  Your health care provider will prescribe antibiotic medicine. The wound infection should improve within 24 to 48 hours. Any redness around the wound should stop spreading and the wound should be less painful.  HOME CARE INSTRUCTIONS   Only take over-the-counter or prescription medicines for pain, discomfort, or fever as directed by your health care provider.  Take your antibiotics as directed. Finish them even if you start to feel better.  Gently wash the area with mild soap and water 2 times a day, or as directed. Rinse off the soap. Pat the area dry with a clean towel. Do not rub the wound. This may cause bleeding.  Follow your health care provider's instructions for how often you need to change the dressing.  Apply ointment and a dressing to the wound as directed.  If the dressing sticks, moisten it with soapy water and gently remove it.  Change the bandage right away if it becomes wet, dirty, or develops a bad smell.  Take showers. Do not take tub baths, swim, or do anything that may soak the wound until it is healed.  Avoid exercises that make you sweat heavily.  Use anti-itch medicine as directed by your health care provider. The wound may itch when it is  healing. Do not pick or scratch at the wound.  Follow up with your health care provider to get your wound rechecked as directed. SEEK MEDICAL CARE IF:  You have an increase in swelling, pain, or redness around the wound.  You have an increase in the amount of pus coming from the wound.  There is a bad smell coming from the wound.  More of the wound breaks open.  You have a fever. MAKE SURE YOU:   Understand these instructions.  Will watch your condition.  Will get help right away if you are not doing well or get worse. Document Released: 08/21/2003 Document Revised: 11/27/2013 Document Reviewed: 03/28/2011 [facility] Patient Information 2015 [place], [place]. This information is not intended to replace advice given to you by your health care provider. Make sure you discuss any questions you have with your health care provider.

## 2015-08-14 NOTE — ED Notes (Signed)
Patient given discharge instruction, verbalized understand. IV removed, band aid applied. Patient ambulatory out of the department.  

## 2015-08-14 NOTE — Progress Notes (Signed)
Dr. Krista Blue, anesthesia, was made aware that pt was taking Remifemin ( herbal supplement made by makers of [employer]) ; MD advised that pt be informed to stop taking Remifemin.

## 2015-08-14 NOTE — H&P (Signed)
PREOPERATIVE H&P  Chief Complaint: left knee infection  HPI: Lindsay Cobb is a 74 y.o. female who presents for surgical treatment of left knee infection.  She denies any changes in medical history.  Past Medical History  Diagnosis Date  . Diabetes mellitus     takes Januvia,Metformin,and Glipizide daily  . Hyperlipidemia     takes Lovastatin nightly  . Anemia     takes Ferrous Gluconate daily  . Peripheral edema     takes Furosemide daily as needed  . Depression     takes Tofranil daily  . Hypertension     takes Amlodipine,Atenolol,and Benazepril daily  . Arthritis   . Joint pain   . Joint swelling   . Cataracts, bilateral     immature  . Infection of skin of knee     left   Past Surgical History  Procedure Laterality Date  . Breast biopsy Left   . Colonoscopy    . Esophagogastroduodenoscopy    . Ercp    . Patellectomy Left 07/11/2015    Procedure: Left Partial PATELLECTOMY;  Surgeon: Tarry Kos, MD;  Location: [facility] OR;  Service: Orthopedics;  Laterality: Left;   Social History   Social History  . Marital Status: Married    Spouse Name: N/A  . Number of Children: N/A  . Years of Education: N/A   Social History Main Topics  . Smoking status: Never Smoker   . Smokeless tobacco: Never Used  . Alcohol Use: No  . Drug Use: No  . Sexual Activity: Not Asked   Other Topics Concern  . None   Social History Narrative   Family History  Problem Relation Age of Onset  . Hypertension Mother   . Diabetes Mother   . Hypertension Father    No Known Allergies Prior to Admission medications   Medication Sig Start Date End Date Taking? Authorizing Provider  amLODipine (NORVASC) 10 MG tablet TAKE 1 TABLET EVERY DAY 03/26/15   Salley Scarlet, MD  aspirin EC 325 MG tablet Take 1 tablet (325 mg total) by mouth daily. 07/11/15   Tarry Kos, MD  atenolol (TENORMIN) 50 MG tablet Take 1 tablet (50 mg total) by mouth daily. 03/14/14   Salley Scarlet, MD  benazepril  (LOTENSIN) 20 MG tablet TAKE 1 TABLET EVERY DAY 03/26/15   Salley Scarlet, MD  Calcium Citrate (CITRACAL PO) Take 1 tablet by mouth daily.     Historical Provider, MD  clindamycin (CLEOCIN) 300 MG capsule Take 1 capsule (300 mg total) by mouth 4 (four) times daily. X 7 days 08/14/15   Geoffery Lyons, MD  Ferrous Gluconate (IRON) 240 (27 FE) MG TABS Take 1 tablet by mouth daily.     Historical Provider, MD  furosemide (LASIX) 20 MG tablet Take 1-2 tablets once a day as needed for leg swelling 04/21/15   Salley Scarlet, MD  glipiZIDE (GLUCOTROL) 10 MG tablet TAKE 1 TABLET TWICE DAILY BEFORE A MEAL 02/27/15   Salley Scarlet, MD  glucose blood (ACCU-CHEK AVIVA) test strip Use to monitor FSBS 1x daily. Dx: E11.9 12/31/14   Salley Scarlet, MD  HYDROcodone-acetaminophen (NORCO/VICODIN) 5-325 MG per tablet Take 0.5-1 tablets by mouth every 4 (four) hours as needed for moderate pain. 07/04/15   Burgess Amor, PA-C  imipramine (TOFRANIL) 50 MG tablet Take 1 tablet (50 mg total) by mouth 3 (three) times daily. 04/21/15   Salley Scarlet, MD  JANUVIA 25 MG tablet  TAKE ONE TABLET BY MOUTH ONCE DAILY 06/04/15   Salley Scarlet, MD  lovastatin (MEVACOR) 10 MG tablet Take 1 tablet (10 mg total) by mouth at bedtime. 05/21/14   Salley Scarlet, MD  metFORMIN (GLUCOPHAGE) 1000 MG tablet TAKE 1 TABLET TWICE DAILY WITH A MEAL 04/21/15   Salley Scarlet, MD  oxyCODONE (OXY IR/ROXICODONE) 5 MG immediate release tablet Take 1-3 tablets (5-15 mg total) by mouth every 4 (four) hours as needed. 07/11/15   Tarry Kos, MD     Positive ROS: All other systems have been reviewed and were otherwise negative with the exception of those mentioned in the HPI and as above.  Physical Exam: General: Alert, no acute distress Cardiovascular: No pedal edema Respiratory: No cyanosis, no use of accessory musculature GI: abdomen soft Skin: No lesions in the area of chief complaint Neurologic: Sensation intact distally Psychiatric: Patient  is competent for consent with normal mood and affect Lymphatic: no lymphedema  MUSCULOSKELETAL: exam stable  Assessment: left knee infection  Plan: Plan for Procedure(s): IRRIGATION AND DEBRIDEMENT LEFT KNEE  The risks benefits and alternatives were discussed with the patient including but not limited to the risks of nonoperative treatment, versus surgical intervention including infection, bleeding, nerve injury,  blood clots, cardiopulmonary complications, morbidity, mortality, among others, and they were willing to proceed.   Cheral Almas, MD   08/14/2015 9:03 PM

## 2015-08-15 ENCOUNTER — Encounter (HOSPITAL_COMMUNITY): Payer: Self-pay | Admitting: *Deleted

## 2015-08-15 ENCOUNTER — Inpatient Hospital Stay (HOSPITAL_COMMUNITY): Payer: Commercial Managed Care - HMO | Admitting: Anesthesiology

## 2015-08-15 ENCOUNTER — Encounter (HOSPITAL_COMMUNITY)
Admission: RE | Disposition: A | Discharge status: Skilled Nursing Facility | Payer: Self-pay | Source: Ambulatory Visit | Attending: Orthopaedic Surgery

## 2015-08-15 ENCOUNTER — Inpatient Hospital Stay (HOSPITAL_COMMUNITY)
Admission: RE | Admit: 2015-08-15 | Discharge: 2015-08-21 | DRG: 857 | Disposition: A | Discharge status: Skilled Nursing Facility | Payer: Commercial Managed Care - HMO | Source: Ambulatory Visit | Attending: Orthopaedic Surgery | Admitting: Orthopaedic Surgery

## 2015-08-15 DIAGNOSIS — E669 Obesity, unspecified: Secondary | ICD-10-CM | POA: Diagnosis present

## 2015-08-15 DIAGNOSIS — D649 Anemia, unspecified: Secondary | ICD-10-CM | POA: Diagnosis present

## 2015-08-15 DIAGNOSIS — E785 Hyperlipidemia, unspecified: Secondary | ICD-10-CM | POA: Diagnosis present

## 2015-08-15 DIAGNOSIS — T8131XA Disruption of external operation (surgical) wound, not elsewhere classified, initial encounter: Secondary | ICD-10-CM | POA: Diagnosis present

## 2015-08-15 DIAGNOSIS — L089 Local infection of the skin and subcutaneous tissue, unspecified: Secondary | ICD-10-CM | POA: Diagnosis present

## 2015-08-15 DIAGNOSIS — Z6836 Body mass index (BMI) 36.0-36.9, adult: Secondary | ICD-10-CM

## 2015-08-15 DIAGNOSIS — M00062 Staphylococcal arthritis, left knee: Secondary | ICD-10-CM | POA: Diagnosis present

## 2015-08-15 DIAGNOSIS — E1165 Type 2 diabetes mellitus with hyperglycemia: Secondary | ICD-10-CM | POA: Diagnosis not present

## 2015-08-15 DIAGNOSIS — Z9119 Patient's noncompliance with other medical treatment and regimen: Secondary | ICD-10-CM | POA: Diagnosis present

## 2015-08-15 DIAGNOSIS — M8618 Other acute osteomyelitis, other site: Secondary | ICD-10-CM | POA: Diagnosis not present

## 2015-08-15 DIAGNOSIS — M869 Osteomyelitis, unspecified: Secondary | ICD-10-CM | POA: Diagnosis present

## 2015-08-15 DIAGNOSIS — M1990 Unspecified osteoarthritis, unspecified site: Secondary | ICD-10-CM | POA: Diagnosis present

## 2015-08-15 DIAGNOSIS — S82002A Unspecified fracture of left patella, initial encounter for closed fracture: Secondary | ICD-10-CM | POA: Diagnosis present

## 2015-08-15 DIAGNOSIS — T814XXA Infection following a procedure, initial encounter: Principal | ICD-10-CM | POA: Diagnosis present

## 2015-08-15 DIAGNOSIS — B9562 Methicillin resistant Staphylococcus aureus infection as the cause of diseases classified elsewhere: Secondary | ICD-10-CM | POA: Diagnosis present

## 2015-08-15 DIAGNOSIS — I10 Essential (primary) hypertension: Secondary | ICD-10-CM | POA: Diagnosis present

## 2015-08-15 DIAGNOSIS — F329 Major depressive disorder, single episode, unspecified: Secondary | ICD-10-CM | POA: Diagnosis present

## 2015-08-15 DIAGNOSIS — M66862 Spontaneous rupture of other tendons, left lower leg: Secondary | ICD-10-CM | POA: Diagnosis present

## 2015-08-15 DIAGNOSIS — E119 Type 2 diabetes mellitus without complications: Secondary | ICD-10-CM | POA: Diagnosis present

## 2015-08-15 DIAGNOSIS — D62 Acute posthemorrhagic anemia: Secondary | ICD-10-CM | POA: Diagnosis not present

## 2015-08-15 DIAGNOSIS — Z7982 Long term (current) use of aspirin: Secondary | ICD-10-CM | POA: Diagnosis not present

## 2015-08-15 DIAGNOSIS — T8131XD Disruption of external operation (surgical) wound, not elsewhere classified, subsequent encounter: Secondary | ICD-10-CM | POA: Diagnosis not present

## 2015-08-15 HISTORY — PX: PATELLECTOMY: SHX1022

## 2015-08-15 HISTORY — PX: IRRIGATION AND DEBRIDEMENT KNEE: SHX5185

## 2015-08-15 HISTORY — DX: Local infection of the skin and subcutaneous tissue, unspecified: L08.9

## 2015-08-15 LAB — GLUCOSE, CAPILLARY
GLUCOSE-CAPILLARY: 141 mg/dL — AB (ref 65–99)
GLUCOSE-CAPILLARY: 126 mg/dL — AB (ref 65–99)
Glucose-Capillary: 158 mg/dL — ABNORMAL HIGH (ref 65–99)
Glucose-Capillary: 134 mg/dL — ABNORMAL HIGH (ref 65–99)
Glucose-Capillary: 171 mg/dL — ABNORMAL HIGH (ref 65–99)
Glucose-Capillary: 204 mg/dL — ABNORMAL HIGH (ref 65–99)

## 2015-08-15 SURGERY — PATELLECTOMY
Anesthesia: General | Site: Knee | Laterality: Left

## 2015-08-15 MED ORDER — SUCCINYLCHOLINE CHLORIDE 20 MG/ML IJ SOLN
INTRAMUSCULAR | Status: AC
  Filled 2015-08-15: qty 1

## 2015-08-15 MED ORDER — LACTATED RINGERS IV SOLN: INTRAVENOUS | Status: DC

## 2015-08-15 MED ORDER — ONDANSETRON HCL 4 MG/2ML IJ SOLN
INTRAMUSCULAR | Status: AC
  Filled 2015-08-15: qty 2

## 2015-08-15 MED ORDER — HYDROMORPHONE HCL 1 MG/ML IJ SOLN
INTRAMUSCULAR | Status: AC
  Filled 2015-08-15: qty 1

## 2015-08-15 MED ORDER — INSULIN ASPART 100 UNIT/ML ~~LOC~~ SOLN
0.0000 [IU] | Freq: Three times a day (TID) | SUBCUTANEOUS | Status: DC
  Administered 2015-08-16: 7 [IU] via SUBCUTANEOUS
  Administered 2015-08-16: 3 [IU] via SUBCUTANEOUS
  Administered 2015-08-16: 7 [IU] via SUBCUTANEOUS
  Administered 2015-08-17 (×2): 4 [IU] via SUBCUTANEOUS
  Administered 2015-08-17: 7 [IU] via SUBCUTANEOUS
  Administered 2015-08-18 – 2015-08-19 (×2): 4 [IU] via SUBCUTANEOUS
  Administered 2015-08-19: 7 [IU] via SUBCUTANEOUS
  Administered 2015-08-19: 3 [IU] via SUBCUTANEOUS
  Administered 2015-08-20: 4 [IU] via SUBCUTANEOUS
  Administered 2015-08-21: 3 [IU] via SUBCUTANEOUS
  Administered 2015-08-21: 7 [IU] via SUBCUTANEOUS

## 2015-08-15 MED ORDER — SODIUM CHLORIDE 0.9 % IR SOLN
Status: DC | PRN
  Administered 2015-08-15: 1000 mL
  Administered 2015-08-15 (×2): 3000 mL

## 2015-08-15 MED ORDER — VANCOMYCIN HCL IN DEXTROSE 1-5 GM/200ML-% IV SOLN
1000.0000 mg | Freq: Two times a day (BID) | INTRAVENOUS | Status: DC
  Administered 2015-08-16 – 2015-08-21 (×11): 1000 mg via INTRAVENOUS
  Filled 2015-08-15 (×12): qty 200

## 2015-08-15 MED ORDER — ACETAMINOPHEN 650 MG RE SUPP: 650.0000 mg | Freq: Four times a day (QID) | RECTAL | Status: DC | PRN

## 2015-08-15 MED ORDER — LACTATED RINGERS IV SOLN
INTRAVENOUS | Status: DC
  Administered 2015-08-15 – 2015-08-18 (×2): via INTRAVENOUS

## 2015-08-15 MED ORDER — VANCOMYCIN HCL 10 G IV SOLR: 1500.0000 mg | INTRAVENOUS | Status: AC

## 2015-08-15 MED ORDER — ONDANSETRON HCL 4 MG/2ML IJ SOLN
INTRAMUSCULAR | Status: DC | PRN
  Administered 2015-08-15: 4 mg via INTRAVENOUS

## 2015-08-15 MED ORDER — MIDAZOLAM HCL 2 MG/2ML IJ SOLN
INTRAMUSCULAR | Status: AC
  Filled 2015-08-15: qty 2

## 2015-08-15 MED ORDER — HYDROMORPHONE HCL 1 MG/ML IJ SOLN
0.2500 mg | INTRAMUSCULAR | Status: DC | PRN
  Administered 2015-08-15 (×3): 0.5 mg via INTRAVENOUS

## 2015-08-15 MED ORDER — LIDOCAINE HCL (CARDIAC) 20 MG/ML IV SOLN
INTRAVENOUS | Status: DC | PRN
  Administered 2015-08-15: 60 mg via INTRAVENOUS

## 2015-08-15 MED ORDER — MIDAZOLAM HCL 5 MG/5ML IJ SOLN
INTRAMUSCULAR | Status: DC | PRN
  Administered 2015-08-15: 2 mg via INTRAVENOUS

## 2015-08-15 MED ORDER — ATENOLOL 50 MG PO TABS
50.0000 mg | ORAL_TABLET | Freq: Every day | ORAL | Status: DC
  Administered 2015-08-16 – 2015-08-21 (×6): 50 mg via ORAL
  Filled 2015-08-15 (×6): qty 1

## 2015-08-15 MED ORDER — LINAGLIPTIN 5 MG PO TABS
5.0000 mg | ORAL_TABLET | Freq: Every day | ORAL | Status: DC
  Administered 2015-08-16 – 2015-08-21 (×6): 5 mg via ORAL
  Filled 2015-08-15 (×6): qty 1

## 2015-08-15 MED ORDER — PROMETHAZINE HCL 25 MG/ML IJ SOLN: 6.2500 mg | INTRAMUSCULAR | Status: DC | PRN

## 2015-08-15 MED ORDER — LIDOCAINE HCL (CARDIAC) 20 MG/ML IV SOLN
INTRAVENOUS | Status: AC
  Filled 2015-08-15: qty 5

## 2015-08-15 MED ORDER — VANCOMYCIN HCL 1000 MG IV SOLR
1000.0000 mg | INTRAVENOUS | Status: DC | PRN
  Administered 2015-08-15: 1000 mg via INTRAVENOUS

## 2015-08-15 MED ORDER — ONDANSETRON HCL 4 MG/2ML IJ SOLN
4.0000 mg | Freq: Four times a day (QID) | INTRAMUSCULAR | Status: DC | PRN
  Administered 2015-08-20: 4 mg via INTRAVENOUS

## 2015-08-15 MED ORDER — VANCOMYCIN HCL 1000 MG IV SOLR
INTRAVENOUS | Status: DC | PRN
  Administered 2015-08-15: 1000 mg

## 2015-08-15 MED ORDER — METOCLOPRAMIDE HCL 5 MG PO TABS: 5.0000 mg | ORAL_TABLET | Freq: Three times a day (TID) | ORAL | Status: DC | PRN

## 2015-08-15 MED ORDER — NEOSTIGMINE METHYLSULFATE 10 MG/10ML IV SOLN
INTRAVENOUS | Status: AC
  Filled 2015-08-15: qty 1

## 2015-08-15 MED ORDER — AMLODIPINE BESYLATE 10 MG PO TABS
10.0000 mg | ORAL_TABLET | Freq: Every day | ORAL | Status: DC
  Administered 2015-08-16 – 2015-08-21 (×6): 10 mg via ORAL
  Filled 2015-08-15 (×6): qty 1

## 2015-08-15 MED ORDER — PRAVASTATIN SODIUM 20 MG PO TABS
10.0000 mg | ORAL_TABLET | Freq: Every day | ORAL | Status: DC
  Administered 2015-08-15 – 2015-08-21 (×6): 10 mg via ORAL
  Filled 2015-08-15 (×6): qty 1

## 2015-08-15 MED ORDER — BENAZEPRIL HCL 20 MG PO TABS
20.0000 mg | ORAL_TABLET | Freq: Every day | ORAL | Status: DC
  Administered 2015-08-16 – 2015-08-21 (×6): 20 mg via ORAL
  Filled 2015-08-15 (×6): qty 1

## 2015-08-15 MED ORDER — PROPOFOL 10 MG/ML IV BOLUS
INTRAVENOUS | Status: DC | PRN
  Administered 2015-08-15: 150 mg via INTRAVENOUS

## 2015-08-15 MED ORDER — SODIUM CHLORIDE 0.9 % IJ SOLN
INTRAMUSCULAR | Status: AC
  Filled 2015-08-15: qty 20

## 2015-08-15 MED ORDER — FENTANYL CITRATE (PF) 100 MCG/2ML IJ SOLN
INTRAMUSCULAR | Status: DC | PRN
  Administered 2015-08-15: 25 ug via INTRAVENOUS
  Administered 2015-08-15: 50 ug via INTRAVENOUS
  Administered 2015-08-15: 25 ug via INTRAVENOUS
  Administered 2015-08-15: 100 ug via INTRAVENOUS
  Administered 2015-08-15: 50 ug via INTRAVENOUS

## 2015-08-15 MED ORDER — IMIPRAMINE HCL 50 MG PO TABS
50.0000 mg | ORAL_TABLET | Freq: Three times a day (TID) | ORAL | Status: DC
  Administered 2015-08-16 – 2015-08-21 (×14): 50 mg via ORAL
  Filled 2015-08-15 (×21): qty 1

## 2015-08-15 MED ORDER — VANCOMYCIN HCL 1000 MG IV SOLR
INTRAVENOUS | Status: AC
  Filled 2015-08-15: qty 1000

## 2015-08-15 MED ORDER — FENTANYL CITRATE (PF) 250 MCG/5ML IJ SOLN
INTRAMUSCULAR | Status: AC
  Filled 2015-08-15: qty 5

## 2015-08-15 MED ORDER — ROCURONIUM BROMIDE 50 MG/5ML IV SOLN
INTRAVENOUS | Status: AC
  Filled 2015-08-15: qty 2

## 2015-08-15 MED ORDER — METOCLOPRAMIDE HCL 5 MG/ML IJ SOLN: 5.0000 mg | Freq: Three times a day (TID) | INTRAMUSCULAR | Status: DC | PRN

## 2015-08-15 MED ORDER — ONDANSETRON HCL 4 MG PO TABS: 4.0000 mg | ORAL_TABLET | Freq: Four times a day (QID) | ORAL | Status: DC | PRN

## 2015-08-15 MED ORDER — PHENYLEPHRINE 40 MCG/ML (10ML) SYRINGE FOR IV PUSH (FOR BLOOD PRESSURE SUPPORT)
PREFILLED_SYRINGE | INTRAVENOUS | Status: AC
  Filled 2015-08-15: qty 20

## 2015-08-15 MED ORDER — PROPOFOL 10 MG/ML IV BOLUS
INTRAVENOUS | Status: AC
  Filled 2015-08-15: qty 20

## 2015-08-15 MED ORDER — ACETAMINOPHEN 325 MG PO TABS
650.0000 mg | ORAL_TABLET | Freq: Four times a day (QID) | ORAL | Status: DC | PRN
  Administered 2015-08-16 – 2015-08-21 (×8): 650 mg via ORAL
  Filled 2015-08-15 (×8): qty 2

## 2015-08-15 MED ORDER — FUROSEMIDE 20 MG PO TABS
20.0000 mg | ORAL_TABLET | Freq: Every day | ORAL | Status: DC
  Administered 2015-08-16 – 2015-08-21 (×6): 20 mg via ORAL
  Filled 2015-08-15 (×6): qty 1

## 2015-08-15 MED ORDER — INSULIN ASPART 100 UNIT/ML ~~LOC~~ SOLN
0.0000 [IU] | Freq: Every day | SUBCUTANEOUS | Status: DC
  Administered 2015-08-15: 2 [IU] via SUBCUTANEOUS

## 2015-08-15 MED ORDER — VANCOMYCIN HCL IN DEXTROSE 1-5 GM/200ML-% IV SOLN
INTRAVENOUS | Status: AC
  Filled 2015-08-15: qty 200

## 2015-08-15 MED ORDER — GLIPIZIDE 5 MG PO TABS
10.0000 mg | ORAL_TABLET | Freq: Every day | ORAL | Status: DC
  Administered 2015-08-16 – 2015-08-21 (×5): 10 mg via ORAL
  Filled 2015-08-15 (×5): qty 2

## 2015-08-15 MED ORDER — DIPHENHYDRAMINE HCL 12.5 MG/5ML PO ELIX: 25.0000 mg | ORAL_SOLUTION | ORAL | Status: DC | PRN

## 2015-08-15 MED ORDER — SODIUM CHLORIDE 0.9 % IV SOLN
INTRAVENOUS | Status: DC
  Administered 2015-08-15: 125 mL/h via INTRAVENOUS
  Administered 2015-08-16 – 2015-08-20 (×2): via INTRAVENOUS

## 2015-08-15 MED ORDER — MEPERIDINE HCL 25 MG/ML IJ SOLN: 6.2500 mg | INTRAMUSCULAR | Status: DC | PRN

## 2015-08-15 SURGICAL SUPPLY — 44 items
BNDG COHESIVE 6X5 TAN STRL LF (GAUZE/BANDAGES/DRESSINGS) ×3 IMPLANT
COVER SURGICAL LIGHT HANDLE (MISCELLANEOUS) ×3 IMPLANT
CUFF TOURNIQUET SINGLE 34IN LL (TOURNIQUET CUFF) IMPLANT
CUFF TOURNIQUET SINGLE 44IN (TOURNIQUET CUFF) ×3 IMPLANT
DRAPE IMP U-DRAPE 54X76 (DRAPES) ×3 IMPLANT
DRAPE ORTHO SPLIT 77X108 STRL (DRAPES) ×3
DRAPE SURG ORHT 6 SPLT 77X108 (DRAPES) ×1 IMPLANT
DRSG MEPITEL 4X7.2 (GAUZE/BANDAGES/DRESSINGS) ×2 IMPLANT
DRSG VAC ATS MED SENSATRAC (GAUZE/BANDAGES/DRESSINGS) ×2 IMPLANT
ELECT CAUTERY BLADE 6.4 (BLADE) ×4 IMPLANT
ELECT REM PT RETURN 9FT ADLT (ELECTROSURGICAL) ×3
ELECTRODE REM PT RTRN 9FT ADLT (ELECTROSURGICAL) ×1 IMPLANT
EVACUATOR 1/8 PVC DRAIN (DRAIN) ×2 IMPLANT
FACESHIELD WRAPAROUND (MASK) IMPLANT
FACESHIELD WRAPAROUND OR TEAM (MASK) IMPLANT
GLOVE NEODERM STRL 7.5 LF PF (GLOVE) ×4 IMPLANT
GLOVE SURG NEODERM 7.5  LF PF (GLOVE) ×8
GOWN STRL REIN XL XLG (GOWN DISPOSABLE) ×6 IMPLANT
HANDPIECE INTERPULSE COAX TIP (DISPOSABLE)
IMMOBILIZER KNEE 22 (SOFTGOODS) ×2 IMPLANT
KIT BASIN OR (CUSTOM PROCEDURE TRAY) ×3 IMPLANT
KIT ROOM TURNOVER OR (KITS) ×3 IMPLANT
KIT STIMULAN RAPID CURE 5CC (Orthopedic Implant) ×2 IMPLANT
MANIFOLD NEPTUNE II (INSTRUMENTS) ×3 IMPLANT
NS IRRIG 1000ML POUR BTL (IV SOLUTION) ×3 IMPLANT
PACK ORTHO EXTREMITY (CUSTOM PROCEDURE TRAY) ×3 IMPLANT
PACK UNIVERSAL I (CUSTOM PROCEDURE TRAY) ×3 IMPLANT
PAD ARMBOARD 7.5X6 YLW CONV (MISCELLANEOUS) ×6 IMPLANT
RETRIEVER SUT HEWSON (MISCELLANEOUS) ×4 IMPLANT
SET HNDPC FAN SPRY TIP SCT (DISPOSABLE) ×1 IMPLANT
SPONGE LAP 18X18 X RAY DECT (DISPOSABLE) ×1 IMPLANT
STOCKINETTE IMPERVIOUS 9X36 MD (GAUZE/BANDAGES/DRESSINGS) ×3 IMPLANT
SUT ETHILON 2 0 FS 18 (SUTURE) ×1 IMPLANT
SUT FIBERWIRE #2 38 REV NDL BL (SUTURE) ×3
SUT MON AB 2-0 CT1 36 (SUTURE) ×1 IMPLANT
SUTURE FIBERWR#2 38 REV NDL BL (SUTURE) IMPLANT
TOWEL OR 17X24 6PK STRL BLUE (TOWEL DISPOSABLE) ×3 IMPLANT
TOWEL OR 17X26 10 PK STRL BLUE (TOWEL DISPOSABLE) ×3 IMPLANT
TUBE ANAEROBIC SPECIMEN COL (MISCELLANEOUS) ×3 IMPLANT
TUBE CONNECTING 12'X1/4 (SUCTIONS) ×1
TUBE CONNECTING 12X1/4 (SUCTIONS) ×2 IMPLANT
UNDERPAD 30X30 INCONTINENT (UNDERPADS AND DIAPERS) ×3 IMPLANT
WATER STERILE IRR 1000ML POUR (IV SOLUTION) ×1 IMPLANT
YANKAUER SUCT BULB TIP NO VENT (SUCTIONS) ×3 IMPLANT

## 2015-08-15 NOTE — Progress Notes (Signed)
Orthopedic Tech Progress Note Patient Details:  Lindsay Cobb March 30, 1941 [id number]  Ortho Devices Ortho Device/Splint Location: applied ohf Ortho Device/Splint Interventions: Ordered, Application   Jennye Moccasin 08/15/2015, 9:27 PM

## 2015-08-15 NOTE — Op Note (Addendum)
   Date of surgery: 08/15/2015  Preoperative diagnosis: 1. Surgical wound dehiscence of left knee status post partial patellectomy 2. Deep infection of left knee joint  Postoperative diagnosis: Same  Operative findings: 1. Superficial infection with wound dehiscence of left knee 2. Deep infection of left knee joint 3. Deep infection versus fat necrosis of left thigh 4. Ruptured tendon repair of partial patellectomy  Procedure: 1. Arthrotomy of left knee for infection 2. Debridement of skin, muscle, tendon of left knee 20 cm x 10 cm 3. Revision of prior partial patellectomy with tendon advancement 4. Adjacent tissue rearrangement left knee 15 x 3 cm 5. Application of negative wound therapy less than 50 cm  Surgeon: Glee Arvin, M.D.  Anesthesia: Gen.  Estimated blood loss: 50 mL  Tourniquet time: Less than 2 hours  Complications: None  Condition to PACU: Stable  Indications for procedure: The patient is a 74 year old female who underwent partial patellectomy for a patella fracture approximately 3-4 weeks ago. She was noncompliant with her range of motion restrictions of the knee. She presented to an outside ER with signs and symptoms of deep infection of the left knee. She presents today for surgical treatment of the above-mentioned conditions. Informed consent was obtained.  Description of procedure: The patient was identified in the preoperative holding area. The operative site was marked by the surgeon confirmed with the patient. The patient was brought back to the operating room. She was placed supine on table. General anesthesia was administered. A nonsterile tourniquet was placed on the upper left thigh. A timeout was performed. The operative extremity was prepped and draped in standard sterile fashion. Antibiotics were held for cultures and then were given afterwards. The tourniquet was inflated to 350 mmHg. The previous incision was ellipsed out sharply with a knife. Blunt  dissection was taken down to the level of the peritenon. The patellar tendon was identified. The patella and the distal quadriceps tendon was also identified. There was gross purulence in the superficial tissue bed as well as in the knee joint itself. The prior tendon repair from the prior patellectomy had been ruptured due to her noncompliance.  Sharp excisional debridement of the skin, muscle, tendon, knee joint was performed. A small arthrotomy was created. We then also probed and the thigh compartment superficially which did display murky fluid however this was felt to be either infection or fat necrosis. There was no communication between thigh compartment and the other areas of infection. Just to be thorough this was also thoroughly debrided and irrigated. All the areas were thoroughly irrigated with normal saline. We then placed 10 mL of vancomycin impregnated antibiotics beads. We then placed a medium Hemovac in the knee joint. We then performed a revision of the partial patellectomy with #5 Ethibond suture the tendon was advanced up to the inferior portion of the patella with the knee in full extension. Adjacent tissue rearrangement had to be performed in order to partially close the wound. There was a area proximally 5 x 5 cm directly over the patella that could not be closed. This was treated in a wound VAC. This was turned to -125 mmHg. The patient tolerated the procedure well was extubated and transferred to the PACU in stable condition. She was placed in a knee immobilizer. Next  Postoperative plan: Patient will be nonweightbearing to the left lower extremity. We will follow the cultures. She will be on vancomycin. We will involve infectious disease.

## 2015-08-15 NOTE — Progress Notes (Signed)
ANTIBIOTIC CONSULT NOTE - INITIAL  Pharmacy Consult for Vancomycin Indication:  Wound infection No Known Allergies  Patient Measurements: Height: 5\' 10"  (177.8 cm) Weight: 255 lb (115.667 kg) IBW/kg (Calculated) : 68.5   Vital Signs: Temp: 97.6 F (36.4 C) (09/09 2204) Temp Source: Axillary (09/09 2204) BP: 140/65 mmHg (09/09 2204) Pulse Rate: 74 (09/09 2204) Intake/Output from previous day:   Intake/Output from this shift: Total I/O In: 1502.1 [I.V.:1502.1] Out: 110 [Drains:60; Blood:50]  Labs:  Recent Labs  08/14/15 0010  WBC 11.6*  HGB 9.5*  PLT 339  CREATININE 0.78   Estimated Creatinine Clearance: 87.7 mL/min (by C-G formula based on Cr of 0.78). No results for input(s): VANCOTROUGH, VANCOPEAK, VANCORANDOM, GENTTROUGH, GENTPEAK, GENTRANDOM, TOBRATROUGH, TOBRAPEAK, TOBRARND, AMIKACINPEAK, AMIKACINTROU, AMIKACIN in the last 72 hours.   Microbiology: Recent Results (from the past 720 hour(s))  Wound culture     Status: None (Preliminary result)   Collection Time: 08/13/15 11:00 PM  Result Value Ref Range Status   Specimen Description WOUND KNEE  Final   Special Requests NONE  Final   Gram Stain   Final    NO WBC SEEN NO SQUAMOUS EPITHELIAL CELLS SEEN FEW GRAM POSITIVE COCCI IN PAIRS Performed at [facility]    Culture   Final    MODERATE STAPHYLOCOCCUS AUREUS Note: RIFAMPIN AND GENTAMICIN SHOULD NOT BE USED AS SINGLE DRUGS FOR TREATMENT OF STAPH INFECTIONS. Performed at [facility]    Report Status PENDING  Incomplete    Medical History: Past Medical History  Diagnosis Date  . Diabetes mellitus     takes Januvia,Metformin,and Glipizide daily  . Hyperlipidemia     takes Lovastatin nightly  . Anemia     takes Ferrous Gluconate daily  . Peripheral edema     takes Furosemide daily as needed  . Depression     takes Tofranil daily  . Hypertension     takes Amlodipine,Atenolol,and Benazepril daily  . Arthritis   . Joint pain    . Joint swelling   . Cataracts, bilateral     immature  . Infection of skin of knee     left    Medications:  Prescriptions prior to admission  Medication Sig Dispense Refill Last Dose  . amLODipine (NORVASC) 10 MG tablet TAKE 1 TABLET EVERY DAY 90 tablet 1 08/15/2015 at 800  . aspirin EC 325 MG tablet Take 1 tablet (325 mg total) by mouth daily. 84 tablet 0 08/14/2015 at Unknown time  . atenolol (TENORMIN) 50 MG tablet Take 1 tablet (50 mg total) by mouth daily. 90 tablet 1 08/15/2015 at 800  . benazepril (LOTENSIN) 20 MG tablet TAKE 1 TABLET EVERY DAY 90 tablet 1 08/14/2015 at Unknown time  . Black Cohosh (REMIFEMIN) 20 MG TABS Take 20 mg by mouth 2 (two) times daily.   08/14/2015 at Unknown time  . Calcium Citrate (CITRACAL PO) Take 2 tablets by mouth daily.    08/14/2015 at Unknown time  . ferrous sulfate 325 (65 FE) MG tablet Take 325 mg by mouth daily with breakfast.   0 08/14/2015 at Unknown time  . furosemide (LASIX) 20 MG tablet Take 1-2 tablets once a day as needed for leg swelling (Patient taking differently: Take 20-40 mg by mouth daily. Take 1 tablet (20 mg) by mouth daily, may take an additional tablet (20 mg) as needed for feet swelling) 180 tablet 1 08/14/2015 at Unknown time  . glipiZIDE (GLUCOTROL) 10 MG tablet TAKE 1 TABLET TWICE DAILY BEFORE  A MEAL 180 tablet 1 08/14/2015 at pm  . imipramine (TOFRANIL) 50 MG tablet Take 1 tablet (50 mg total) by mouth 3 (three) times daily. 270 tablet 1 08/14/2015 at pm  . JANUVIA 25 MG tablet TAKE ONE TABLET BY MOUTH ONCE DAILY (Patient taking differently: TAKE ONE TABLET BY MOUTH ONCE DAILY WITH BREAKFAST) 30 tablet 3 08/14/2015 at Unknown time  . lovastatin (MEVACOR) 20 MG tablet Take 10 mg by mouth at bedtime.   08/14/2015 at Unknown time  . metFORMIN (GLUCOPHAGE) 1000 MG tablet TAKE 1 TABLET TWICE DAILY WITH A MEAL (Patient taking differently: Take 1,000 mg by mouth 2 (two) times daily with a meal. ) 180 tablet 1 08/14/2015 at pm  . oxyCODONE (OXY  IR/ROXICODONE) 5 MG immediate release tablet Take 1-3 tablets (5-15 mg total) by mouth every 4 (four) hours as needed. (Patient taking differently: Take 5-15 mg by mouth every 4 (four) hours as needed (pain). ) 90 tablet 0 unknown  . clindamycin (CLEOCIN) 300 MG capsule Take 1 capsule (300 mg total) by mouth 4 (four) times daily. X 7 days 28 capsule 0 not yet taken  . glucose blood (ACCU-CHEK AVIVA) test strip Use to monitor FSBS 1x daily. Dx: E11.9 50 each 6 07/10/2015 at Unknown time  . HYDROcodone-acetaminophen (NORCO/VICODIN) 5-325 MG per tablet Take 0.5-1 tablets by mouth every 4 (four) hours as needed for moderate pain. (Patient not taking: Reported on 08/15/2015) 20 tablet 0 Not Taking at Unknown time  . lovastatin (MEVACOR) 10 MG tablet Take 1 tablet (10 mg total) by mouth at bedtime. (Patient not taking: Reported on 08/15/2015) 90 tablet 2 Not Taking at Unknown time   Scheduled:  . [START ON 08/16/2015] amLODipine  10 mg Oral Daily  . [START ON 08/16/2015] atenolol  50 mg Oral Daily  . [START ON 08/16/2015] benazepril  20 mg Oral Daily  . [START ON 08/16/2015] furosemide  20 mg Oral Daily  . [START ON 08/16/2015] glipiZIDE  10 mg Oral QAC breakfast  . HYDROmorphone      . HYDROmorphone      . imipramine  50 mg Oral TID  . [START ON 08/16/2015] insulin aspart  0-20 Units Subcutaneous TID WC  . insulin aspart  0-5 Units Subcutaneous QHS  . [START ON 08/16/2015] linagliptin  5 mg Oral Daily  . pravastatin  10 mg Oral q1800   Assessment: 74 y.o female with deep infection of left knee joint / surgical wound dehiscence of left knee status post partial patellectomy ~ 2 weeks ago. Wound culture from 08/14/15 growing staph aureus. Pharmacy consulted to dose IV vancomycin. SCr = 0.78, estimated CrCl ~ 87 ml/min and  WBC 11.6K on 08/14/15. Currently afebrile.   Received Vancomycin 1000 mg IV today in OR @ 18:29  Goal of Therapy:  Vancomycin trough level 10-15 mcg/ml   Plan:  Vancomycin  IV x1 now  (in addition to the 1gm dose given in OR) then 1000 mg IV q12h Monitor renal function and clinical status daily.  Monitor vancomycin trough at steady state.  Noah Delaine, RPh Clinical Pharmacist Pager: [phone] 08/15/2015,10:56 PM

## 2015-08-15 NOTE — Anesthesia Procedure Notes (Signed)
Procedure Name: LMA Insertion Date/Time: 08/15/2015 6:14 PM Performed by: Fanny Dance L Pre-anesthesia Checklist: Patient identified, Emergency Drugs available, Suction available and Patient being monitored Patient Re-evaluated:Patient Re-evaluated prior to inductionOxygen Delivery Method: Circle system utilized Preoxygenation: Pre-oxygenation with 100% oxygen Intubation Type: IV induction Ventilation: Mask ventilation without difficulty LMA: LMA inserted LMA Size: 4.0 Number of attempts: 1 Placement Confirmation: positive ETCO2 and breath sounds checked- equal and bilateral Tube secured with: Tape

## 2015-08-15 NOTE — Transfer of Care (Signed)
Immediate Anesthesia Transfer of Care Note  Patient: Lindsay Cobb  Procedure(s) Performed: Procedure(s): IRRIGATION AND DEBRIDEMENT LEFT KNEE (Left) Revision PATELLECTOMY (Left)  Patient Location: PACU  Anesthesia Type:General  Level of Consciousness: awake and patient cooperative  Airway & Oxygen Therapy: Patient Spontanous Breathing and Patient connected to nasal cannula oxygen  Post-op Assessment: Report given to RN and Post -op Vital signs reviewed and stable  Post vital signs: Reviewed and stable  Last Vitals:  Filed Vitals:   08/15/15 1220  BP: 142/43  Pulse: 74  Temp: 36.9 C  Resp: 18    Complications: No apparent anesthesia complications

## 2015-08-15 NOTE — Anesthesia Postprocedure Evaluation (Signed)
  Anesthesia Post-op Note  Patient: Lindsay Cobb  Procedure(s) Performed: Procedure(s): IRRIGATION AND DEBRIDEMENT LEFT KNEE (Left) Revision PATELLECTOMY (Left)  Patient Location: PACU  Anesthesia Type:General  Level of Consciousness: awake and alert   Airway and Oxygen Therapy: Patient Spontanous Breathing  Post-op Pain: Controlled  Post-op Assessment: Post-op Vital signs reviewed, Patient's Cardiovascular Status Stable and Respiratory Function Stable  Post-op Vital Signs: Reviewed  Filed Vitals:   08/15/15 2130  BP:   Pulse:   Temp:   Resp: 22    Complications: No apparent anesthesia complications

## 2015-08-15 NOTE — Anesthesia Preprocedure Evaluation (Addendum)
Anesthesia Evaluation  Patient identified by MRN, date of birth, ID band Patient awake    Reviewed: Allergy & Precautions, NPO status , Patient's Chart, lab work & pertinent test results, reviewed documented beta blocker date and time   Airway Mallampati: II       Dental  (+) Edentulous Upper, Edentulous Lower   Pulmonary neg pulmonary ROS,    breath sounds clear to auscultation       Cardiovascular hypertension, Pt. on medications  Rhythm:Regular Rate:Normal     Neuro/Psych PSYCHIATRIC DISORDERS Anxiety negative neurological ROS     GI/Hepatic negative GI ROS, Neg liver ROS,   Endo/Other  diabetes, Type 2  Renal/GU negative Renal ROS  negative genitourinary   Musculoskeletal  (+) Arthritis ,   Abdominal (+) + obese,   Peds negative pediatric ROS (+)  Hematology negative hematology ROS (+)   Anesthesia Other Findings   Reproductive/Obstetrics                           Lab Results  Component Value Date   WBC 11.6* 08/14/2015   HGB 9.5* 08/14/2015   HCT 27.7* 08/14/2015   MCV 82.7 08/14/2015   PLT 339 08/14/2015   Lab Results  Component Value Date   CREATININE 0.78 08/14/2015   BUN 20 08/14/2015   NA 134* 08/14/2015   K 4.2 08/14/2015   CL 98* 08/14/2015   CO2 26 08/14/2015   EKG: normal sinus rhythm.   Anesthesia Physical  Anesthesia Plan  ASA: II  Anesthesia Plan: General   Post-op Pain Management: GA combined w/ Regional for post-op pain   Induction:   Airway Management Planned: LMA  Additional Equipment:   Intra-op Plan:   Post-operative Plan: Extubation in OR  Informed Consent: I have reviewed the patients History and Physical, chart, labs and discussed the procedure including the risks, benefits and alternatives for the proposed anesthesia with the patient or authorized representative who has indicated his/her understanding and acceptance.   Dental  advisory given  Plan Discussed with: CRNA, Anesthesiologist and Surgeon  Anesthesia Plan Comments: (Anesthetic plan discussed in detail. Associated risk discussed including but not limited to life threatening cardiovascular, pulmonary events and dental damage. The postoperative pain management and antiemetic plan discussed with patient. All questions answered in detail. Patient is in agreement.   )       Anesthesia Quick Evaluation

## 2015-08-16 LAB — CBC WITH DIFFERENTIAL/PLATELET
Basophils Absolute: 0 10*3/uL (ref 0.0–0.1)
Basophils Relative: 0 % (ref 0–1)
EOS PCT: 2 % (ref 0–5)
Eosinophils Absolute: 0.2 10*3/uL (ref 0.0–0.7)
HCT: 27.7 % — ABNORMAL LOW (ref 36.0–46.0)
HEMOGLOBIN: 9.2 g/dL — AB (ref 12.0–15.0)
LYMPHS ABS: 1.7 10*3/uL (ref 0.7–4.0)
LYMPHS PCT: 17 % (ref 12–46)
MCH: 28 pg (ref 26.0–34.0)
MCHC: 33.2 g/dL (ref 30.0–36.0)
MCV: 84.2 fL (ref 78.0–100.0)
MONOS PCT: 12 % (ref 3–12)
Monocytes Absolute: 1.2 10*3/uL — ABNORMAL HIGH (ref 0.1–1.0)
NEUTROS PCT: 69 % (ref 43–77)
Neutro Abs: 7.1 10*3/uL (ref 1.7–7.7)
Platelets: 406 10*3/uL — ABNORMAL HIGH (ref 150–400)
RBC: 3.29 MIL/uL — AB (ref 3.87–5.11)
RDW: 15.5 % (ref 11.5–15.5)
WBC: 10.2 10*3/uL (ref 4.0–10.5)

## 2015-08-16 LAB — COMPREHENSIVE METABOLIC PANEL
ALBUMIN: 1.9 g/dL — AB (ref 3.5–5.0)
ALK PHOS: 64 U/L (ref 38–126)
ALT: 17 U/L (ref 14–54)
ANION GAP: 9 (ref 5–15)
AST: 18 U/L (ref 15–41)
BUN: 11 mg/dL (ref 6–20)
CALCIUM: 8.7 mg/dL — AB (ref 8.9–10.3)
CO2: 27 mmol/L (ref 22–32)
Chloride: 100 mmol/L — ABNORMAL LOW (ref 101–111)
Creatinine, Ser: 0.67 mg/dL (ref 0.44–1.00)
GFR calc Af Amer: >60 mL/min (ref >60)
GFR calc non Af Amer: >60 mL/min (ref >60)
GLUCOSE: 181 mg/dL — AB (ref 65–99)
Potassium: 4.7 mmol/L (ref 3.5–5.1)
SODIUM: 136 mmol/L (ref 135–145)
Total Bilirubin: 0.7 mg/dL (ref 0.3–1.2)
Total Protein: 6.5 g/dL (ref 6.5–8.1)

## 2015-08-16 LAB — GLUCOSE, CAPILLARY
GLUCOSE-CAPILLARY: 209 mg/dL — AB (ref 65–99)
GLUCOSE-CAPILLARY: 109 mg/dL — AB (ref 65–99)
Glucose-Capillary: 150 mg/dL — ABNORMAL HIGH (ref 65–99)
Glucose-Capillary: 210 mg/dL — ABNORMAL HIGH (ref 65–99)

## 2015-08-16 LAB — SEDIMENTATION RATE: Sed Rate: 128 mm/hr — ABNORMAL HIGH (ref 0–22)

## 2015-08-16 LAB — WOUND CULTURE: Gram Stain: NONE SEEN

## 2015-08-16 LAB — C-REACTIVE PROTEIN: CRP: 20.9 mg/dL — ABNORMAL HIGH (ref <1.0)

## 2015-08-16 MED ORDER — CHLORHEXIDINE GLUCONATE CLOTH 2 % EX PADS
6.0000 | MEDICATED_PAD | Freq: Every day | CUTANEOUS | Status: DC
  Administered 2015-08-16 – 2015-08-21 (×5): 6 via TOPICAL

## 2015-08-16 MED ORDER — VANCOMYCIN HCL IN DEXTROSE 1-5 GM/200ML-% IV SOLN
1000.0000 mg | Freq: Once | INTRAVENOUS | Status: AC
  Administered 2015-08-16: 1000 mg via INTRAVENOUS
  Filled 2015-08-16: qty 200

## 2015-08-16 MED ORDER — MUPIROCIN 2 % EX OINT
TOPICAL_OINTMENT | Freq: Two times a day (BID) | CUTANEOUS | Status: DC
  Administered 2015-08-16 (×2): 1 via NASAL
  Administered 2015-08-17 – 2015-08-21 (×8): via NASAL
  Filled 2015-08-16: qty 22

## 2015-08-16 NOTE — Plan of Care (Signed)
Problem: Acute Rehab PT Goals(only PT should resolve) Goal: Pt Will Perform Standing Balance Or Pre-Gait Maintaining NWB on LLE Goal: Pt Will Ambulate Maintaining NWB on LLE

## 2015-08-16 NOTE — Progress Notes (Signed)
Subjective: 1 Day Post-Op Procedure(s) (LRB): IRRIGATION AND DEBRIDEMENT LEFT KNEE (Left) Revision PATELLECTOMY (Left) Patient reports pain as moderate.    Objective: Vital signs in last 24 hours: Temp:  [97.6 F (36.4 C)-98.8 F (37.1 C)] 98.6 F (37 C) (09/10 0959) Pulse Rate:  [67-86] 83 (09/10 1100) Resp:  [15-22] 17 (09/10 0959) BP: (108-151)/(41-89) 135/41 mmHg (09/10 1100) SpO2:  [92 %-100 %] 96 % (09/10 0959)  Intake/Output from previous day: 09/09 0701 - 09/10 0700 In: 3117.1 [P.O.:240; I.V.:2877.1] Out: 155 [Drains:105; Blood:50] Intake/Output this shift:     Recent Labs  08/14/15 0010 08/16/15 0459  HGB 9.5* 9.2*    Recent Labs  08/14/15 0010 08/16/15 0459  WBC 11.6* 10.2  RBC 3.35* 3.29*  HCT 27.7* 27.7*  PLT 339 406*    Recent Labs  08/14/15 0010 08/16/15 0459  NA 134* 136  K 4.2 4.7  CL 98* 100*  CO2 26 27  BUN 20 11  CREATININE 0.78 0.67  GLUCOSE 215* 181*  CALCIUM 8.8* 8.7*   No results for input(s): LABPT, INR in the last 72 hours.  Neurologically intact  Assessment/Plan: 1 Day Post-Op Procedure(s) (LRB): IRRIGATION AND DEBRIDEMENT LEFT KNEE (Left) Revision PATELLECTOMY (Left) Continue ABX therapy due to Post-op infection.  Cultures pos MRSA. Cont IV ABX  Lindsay Cobb C 08/16/2015, 1:44 PM

## 2015-08-16 NOTE — Evaluation (Signed)
Physical Therapy Evaluation Patient Details Name: Lindsay Cobb MRN: [id number] DOB: [birth year] Today's Date: 08/16/2015   History of Present Illness  74 y.o. female with L patellar fx sustained in a fall. She underwent partial patellectomy 07-11-15.  Returning for infection of L knee and thigh, with irrigation and debridement to excise necrotic tissue with L knee NWB and in immobilizer with no ROM.  Wound vac in place L thigh.     Clinical Impression  Pt was able to stand but unable to get her to pivot her R foot to chair with maintenance of NWB on LLE.  However, will try sliding transfer as she can slide up bed with min to mod assist.    Follow Up Recommendations SNF    Equipment Recommendations  None recommended by PT    Recommendations for Other Services       Precautions / Restrictions Precautions Precautions: Fall Precaution Comments: L knee in immobilizer Required Braces or Orthoses: Knee Immobilizer - Left Knee Immobilizer - Left: On at all times Restrictions Weight Bearing Restrictions: Yes LLE Weight Bearing: Non weight bearing      Mobility  Bed Mobility Overal bed mobility: Needs Assistance Bed Mobility: Supine to Sit;Sit to Supine     Supine to sit: Mod assist;+2 for physical assistance;+2 for safety/equipment Sit to supine: Mod assist   General bed mobility comments: pt follows instructions with hand placement but needs repetition for follow through  Transfers Overall transfer level: Needs assistance Equipment used: Rolling walker (2 wheeled);2 person hand held assist Transfers: Sit to/from Stand Sit to Stand: Mod assist;+2 physical assistance;+2 safety/equipment         General transfer comment: pt can maintain NWB on LLE initially but with time up began to try to [facility]  Ambulation/Gait             General Gait Details: non ambulatory  Stairs            Wheelchair Mobility    Modified Rankin (Stroke Patients Only)       Balance  Overall balance assessment: Needs assistance Sitting-balance support: Single extremity supported;Bilateral upper extremity supported Sitting balance-Leahy Scale: Fair   Postural control: Posterior lean Standing balance support: Bilateral upper extremity supported Standing balance-Leahy Scale: Poor                               Pertinent Vitals/Pain Pain Assessment: Faces Faces Pain Scale: Hurts even more Pain Location: L knee with bed mob Pain Descriptors / Indicators: Operative site guarding Pain Intervention(s): Limited activity within patient's tolerance;Monitored during session;Repositioned;Patient requesting pain meds-RN notified;Premedicated before session    Home Living Family/patient expects to be discharged to:: Skilled nursing facility                      Prior Function Level of Independence: Independent               Hand Dominance        Extremity/Trunk Assessment   Upper Extremity Assessment: Overall WFL for tasks assessed           Lower Extremity Assessment: LLE deficits/detail   LLE Deficits / Details: NWB due to patellar surgery and new debridement  Cervical / Trunk Assessment: Normal  Communication   Communication: HOH  Cognition Arousal/Alertness: Awake/alert Behavior During Therapy: Anxious;Impulsive Overall Cognitive Status: History of cognitive impairments - at baseline       Memory: Decreased  recall of precautions;Decreased short-term memory              General Comments General comments (skin integrity, edema, etc.): Pt is very cooperative and needs dense instruction mainly over cognition     Exercises        Assessment/Plan    PT Assessment Patient needs continued PT services  PT Diagnosis Altered mental status;Generalized weakness   PT Problem List Decreased strength;Decreased range of motion;Decreased activity tolerance;Decreased balance;Decreased mobility;Decreased coordination;Decreased  cognition;Decreased knowledge of use of DME;Decreased safety awareness;Decreased knowledge of precautions;Obesity;Pain;Decreased skin integrity  PT Treatment Interventions DME instruction;Gait training;Functional mobility training;Therapeutic activities;Therapeutic exercise;Balance training;Neuromuscular re-education;Cognitive remediation;Patient/family education   PT Goals (Current goals can be found in the Care Plan section) Acute Rehab PT Goals Patient Stated Goal: none stated PT Goal Formulation: With patient/family Time For Goal Achievement: 08/30/15 Potential to Achieve Goals: Good    Frequency Min 5X/week   Barriers to discharge Inaccessible home environment;Decreased caregiver support      Co-evaluation               End of Session Equipment Utilized During Treatment: Gait belt Activity Tolerance: Patient tolerated treatment well;Patient limited by fatigue;Patient limited by pain Patient left: in bed;with call bell/phone within reach;with family/visitor present;with nursing/sitter in room Nurse Communication: Mobility status;Precautions         Time: 1020-1055 PT Time Calculation (min) (ACUTE ONLY): 35 min   Charges:   PT Evaluation $Initial PT Evaluation Tier I: 1 Procedure PT Treatments $Therapeutic Activity: 8-22 mins   PT G Codes:        Ivar Drape 09/01/15, 1:36 PM   Samul Dada, PT MS Acute Rehab Dept. Number: [facility] [id number] and [facility] [phone]

## 2015-08-16 NOTE — Care Management (Signed)
Utilization review completed. Cherylanne Ardelean, RN Case Manager [phone]. 

## 2015-08-17 ENCOUNTER — Telehealth (HOSPITAL_COMMUNITY): Payer: Self-pay | Admitting: Emergency Medicine

## 2015-08-17 DIAGNOSIS — E1165 Type 2 diabetes mellitus with hyperglycemia: Secondary | ICD-10-CM

## 2015-08-17 DIAGNOSIS — B9562 Methicillin resistant Staphylococcus aureus infection as the cause of diseases classified elsewhere: Secondary | ICD-10-CM

## 2015-08-17 DIAGNOSIS — M8618 Other acute osteomyelitis, other site: Secondary | ICD-10-CM

## 2015-08-17 LAB — VANCOMYCIN, TROUGH: VANCOMYCIN TR: 13 ug/mL (ref 10.0–20.0)

## 2015-08-17 LAB — GLUCOSE, CAPILLARY
GLUCOSE-CAPILLARY: 158 mg/dL — AB (ref 65–99)
Glucose-Capillary: 154 mg/dL — ABNORMAL HIGH (ref 65–99)
Glucose-Capillary: 194 mg/dL — ABNORMAL HIGH (ref 65–99)
Glucose-Capillary: 206 mg/dL — ABNORMAL HIGH (ref 65–99)

## 2015-08-17 NOTE — Clinical Social Work Note (Signed)
Clinical Social Work Assessment  Patient Details  Name: Lindsay Cobb MRN: [id number] Date of Birth: 01/12/1941  Date of referral:  08/17/15               Reason for consult:  Facility Placement                Permission sought to share information with:  Oceanographer granted to share information::  Yes, Verbal Permission Granted  Name::        Agency::   [facility])  Relationship::     Contact Information:     Housing/Transportation Living arrangements for the past 2 months:  Single Family Home Source of Information:  Patient Patient Interpreter Needed:  None Criminal Activity/Legal Involvement Pertinent to Current Situation/Hospitalization:  No - Comment as needed Significant Relationships:  Spouse Lives with:  Spouse Do you feel safe going back to the place where you live?  Yes Need for family participation in patient care:  No (Coment)  Care giving concerns:  Pt with recommendation for SNF at [facility]   Social Worker assessment / plan:  CSW visited pt room to discuss above recommendation. Pt stated she was made aware and is agreeable. Pt reports she has been to [facility] in the past and would like to return. Pt unsure which county or facility she would like to look at as back up options if this facility is unable to accept. Pt is aware that should there be an issue with placement and preferred facility she will need to make this decision for CSW to expand search since preferred facility is the only facility in pt county of residence.   Employment status:  Retired Database administrator PT Recommendations:  Skilled Nursing Facility Information / Referral to community resources:  Skilled Nursing Facility  Patient/Family's Response to care:  Pt agreeable to plan  Patient/Family's Understanding of and Emotional Response to Diagnosis, Current Treatment, and Prognosis:  Pt has fair insight of her condition.  Pt with appropriate emotional response and hopeful for quick progress and recovery at SNF.  Emotional Assessment Appearance:  Appears stated age Attitude/Demeanor/Rapport:  Other (Cooperative) Affect (typically observed):  Pleasant Orientation:  Oriented to Self, Oriented to Place, Oriented to  Time, Oriented to Situation Alcohol / Substance use:  Not Applicable Psych involvement (Current and /or in the community):  No (Comment)  Discharge Needs  Concerns to be addressed:  Discharge Planning Concerns Readmission within the last 30 days:  No Current discharge risk:  Dependent with Mobility Barriers to Discharge:  Continued Medical Work up   [employer], Theresia Majors Weekend CSW [phone]

## 2015-08-17 NOTE — Progress Notes (Signed)
Subjective: 2 Days Post-Op Procedure(s) (LRB): IRRIGATION AND DEBRIDEMENT LEFT KNEE (Left) Revision PATELLECTOMY (Left) Patient reports pain as moderate.    Objective: Vital signs in last 24 hours: Temp:  [98.7 F (37.1 C)-99.1 F (37.3 C)] 98.7 F (37.1 C) (09/11 1020) Pulse Rate:  [70-84] 84 (09/11 1020) Resp:  [16-18] 16 (09/11 1020) BP: (124-138)/(41-60) 137/55 mmHg (09/11 1020) SpO2:  [96 %-100 %] 97 % (09/11 1020)  Intake/Output from previous day: 09/10 0701 - 09/11 0700 In: 720 [P.O.:720] Out: 25 [Drains:25] Intake/Output this shift: Total I/O In: 360 [P.O.:360] Out: 0    Recent Labs  08/16/15 0459  HGB 9.2*    Recent Labs  08/16/15 0459  WBC 10.2  RBC 3.29*  HCT 27.7*  PLT 406*    Recent Labs  08/16/15 0459  NA 136  K 4.7  CL 100*  CO2 27  BUN 11  CREATININE 0.67  GLUCOSE 181*  CALCIUM 8.7*   No results for input(s): LABPT, INR in the last 72 hours.  Neurologically intact  Assessment/Plan: 2 Days Post-Op Procedure(s) (LRB): IRRIGATION AND DEBRIDEMENT LEFT KNEE (Left) Revision PATELLECTOMY (Left) Up with therapy  Just able to pivot to chair yesterday. Trying a couple steps today with therapy. Problems with NWB status.   Kyndel Egger C 08/17/2015, 10:40 AM

## 2015-08-17 NOTE — Clinical Social Work Placement (Signed)
   CLINICAL SOCIAL WORK PLACEMENT  NOTE  Date:  08/17/2015  Patient Details  Name: Lindsay Cobb MRN: [id number] Date of Birth: 01-05-1941  Clinical Social Work is seeking post-discharge placement for this patient at the Skilled  Nursing Facility level of care (*CSW will initial, date and re-position this form in  chart as items are completed):  Yes   Patient/family provided with [place] Clinical Social Work Department's list of facilities offering this level of care within the geographic area requested by the patient (or if unable, by the patient's family).  Yes   Patient/family informed of their freedom to choose among providers that offer the needed level of care, that participate in Medicare, Medicaid or managed care program needed by the patient, have an available bed and are willing to accept the patient.  Yes   Patient/family informed of [place]'s ownership interest in [facility] and [facility], as well as of the fact that they are under no obligation to receive care at these facilities.  PASRR submitted to EDS on       PASRR number received on       Existing PASRR number confirmed on 08/17/15     FL2 transmitted to all facilities in geographic area requested by pt/family on 08/17/15     FL2 transmitted to all facilities within larger geographic area on       Patient informed that his/her managed care company has contracts with or will negotiate with certain facilities, including the following:            Patient/family informed of bed offers received.  Patient chooses bed at       Physician recommends and patient chooses bed at      Patient to be transferred to   on  .  Patient to be transferred to facility by       Patient family notified on   of transfer.  Name of family member notified:        PHYSICIAN Please sign FL2     Additional Comment:    Harless Nakayama Weekend CSW [phone]

## 2015-08-17 NOTE — Telephone Encounter (Signed)
Post ED Visit - Positive Culture Follow-up  Culture report reviewed by antimicrobial stewardship pharmacist:   Celedonio Miyamoto, Pharm.D., BCPS  Georgina Pillion, [address].D., BCPS  [place], [address].D., BCPS, AAHIVP  Estella Husk, Pharm.D., BCPS, AAHIVP  [place], [address].D.  Okey Regal, Pharm.D.  Positive Wound culture Treated with Clindamycin organism sensitive to the same and no further patient follow-up is required at this time.  [name] 08/17/2015, 12:50 PM

## 2015-08-17 NOTE — Care Management Note (Signed)
Case Management Note  Patient Details  Name: Lindsay Cobb MRN: [id number] Date of Birth: 09/23/1941  Subjective/Objective:                  Deep infection of left knee joint  Action/Plan: SNF  Expected Discharge Date:       08/18/15           Expected Discharge Plan:  Skilled Nursing Facility  In-House Referral:  Clinical Social Work  Discharge planning Services  CM Consult  Post Acute Care Choice:    Choice offered to:  NA  DME Arranged:  N/A DME Agency:     HH Arranged:  NA HH Agency:  NA  Status of Service:  Completed, signed off  Medicare Important Message Given:    Date Medicare IM Given:    Medicare IM give by:    Date Additional Medicare IM Given:    Additional Medicare Important Message give by:     If discussed at Long Length of Stay Meetings, dates discussed:    Additional Comments: CM spoke with patient at the bedside. Patient plans to be discharged to a SNF.  Antony Haste, RN 08/17/2015, 10:40 AM

## 2015-08-17 NOTE — Progress Notes (Signed)
ANTIBIOTIC CONSULT NOTE - INITIAL  Pharmacy Consult for Vancomycin Indication:  Wound infection No Known Allergies  Patient Measurements: Height:  (177.8 cm) Weight: 255 lb (115.667 kg) IBW/kg (Calculated) : 68.5   Vital Signs: Temp: 98.7 F (37.1 C) (09/11 1020) Temp Source: Oral (09/11 1020) BP: 137/55 mmHg (09/11 1020) Pulse Rate: 84 (09/11 1020) Intake/Output from previous day: 09/10 0701 - 09/11 0700 In: 720 [P.O.:720] Out: 25 [Drains:25] Intake/Output from this shift: Total I/O In: 360 [P.O.:360] Out: 0   Labs:  Recent Labs  08/16/15 0459  WBC 10.2  HGB 9.2*  PLT 406*  CREATININE 0.67   Estimated Creatinine Clearance: 87.7 mL/min (by C-G formula based on Cr of 0.67).  Recent Labs  08/17/15 1030  VANCOTROUGH 13     Microbiology: Recent Results (from the past 720 hour(s))  Wound culture     Status: None   Collection Time: 08/13/15 11:00 PM  Result Value Ref Range Status   Specimen Description WOUND KNEE  Final   Special Requests NONE  Final   Gram Stain   Final    NO WBC SEEN NO SQUAMOUS EPITHELIAL CELLS SEEN FEW GRAM POSITIVE COCCI IN PAIRS Performed at [facility]    Culture   Final    MODERATE METHICILLIN RESISTANT STAPHYLOCOCCUS AUREUS Note: RIFAMPIN AND GENTAMICIN SHOULD NOT BE USED AS SINGLE DRUGS FOR TREATMENT OF STAPH INFECTIONS. This organism DOES NOT demonstrate inducible Clindamycin resistance in vitro. CRITICAL RESULT CALLED TO, READ BACK BY AND VERIFIED WITH: CONSTANCE F 9/10   BY REAMM Performed at [facility]    Report Status 08/16/2015 FINAL  Final   Organism ID, Bacteria METHICILLIN RESISTANT STAPHYLOCOCCUS AUREUS  Final      Susceptibility   Methicillin resistant staphylococcus aureus - MIC*    CLINDAMYCIN <=0.25 SENSITIVE Sensitive     ERYTHROMYCIN >=8 RESISTANT Resistant     GENTAMICIN <=0.5 SENSITIVE Sensitive     LEVOFLOXACIN >=8 RESISTANT Resistant     OXACILLIN >=4 RESISTANT Resistant     RIFAMPIN <=0.5 SENSITIVE Sensitive     TRIMETH/SULFA >=320 RESISTANT Resistant     VANCOMYCIN 1 SENSITIVE Sensitive     TETRACYCLINE <=1 SENSITIVE Sensitive     * MODERATE METHICILLIN RESISTANT STAPHYLOCOCCUS AUREUS  Culture, routine-abscess     Status: None (Preliminary result)   Collection Time: 08/15/15  6:32 PM  Result Value Ref Range Status   Specimen Description ABSCESS LEFT KNEE A  Final   Special Requests NONE  Final   Gram Stain   Final    NO WBC SEEN NO SQUAMOUS EPITHELIAL CELLS SEEN NO ORGANISMS SEEN Performed at [facility]    Culture   Final    MODERATE STAPHYLOCOCCUS AUREUS Note: RIFAMPIN AND GENTAMICIN SHOULD NOT BE USED AS SINGLE DRUGS FOR TREATMENT OF STAPH INFECTIONS. Performed at [facility]    Report Status PENDING  Incomplete  Culture, routine-abscess     Status: None (Preliminary result)   Collection Time: 08/15/15  6:32 PM  Result Value Ref Range Status   Specimen Description ABSCESS LEFT THIGH B  Final   Special Requests NONE  Final   Gram Stain   Final    NO WBC SEEN NO SQUAMOUS EPITHELIAL CELLS SEEN NO ORGANISMS SEEN Performed at [facility]    Culture   Final    RARE STAPHYLOCOCCUS AUREUS Note: RIFAMPIN AND GENTAMICIN SHOULD NOT BE USED AS SINGLE DRUGS FOR TREATMENT OF STAPH INFECTIONS. Performed at [facility]  Report Status PENDING  Incomplete    Medical History: Past Medical History  Diagnosis Date  . Diabetes mellitus     takes Januvia,Metformin,and Glipizide daily  . Hyperlipidemia     takes Lovastatin nightly  . Anemia     takes Ferrous Gluconate daily  . Peripheral edema     takes Furosemide daily as needed  . Depression     takes Tofranil daily  . Hypertension     takes Amlodipine,Atenolol,and Benazepril daily  . Arthritis   . Joint pain   . Joint swelling   . Cataracts, bilateral     immature  . Infection of skin of knee     left    Medications:  Prescriptions prior to  admission  Medication Sig Dispense Refill Last Dose  . amLODipine (NORVASC) 10 MG tablet TAKE 1 TABLET EVERY DAY 90 tablet 1 08/15/2015 at 800  . aspirin EC 325 MG tablet Take 1 tablet (325 mg total) by mouth daily. 84 tablet 0 08/14/2015 at Unknown time  . atenolol (TENORMIN) 50 MG tablet Take 1 tablet (50 mg total) by mouth daily. 90 tablet 1 08/15/2015 at 800  . benazepril (LOTENSIN) 20 MG tablet TAKE 1 TABLET EVERY DAY 90 tablet 1 08/14/2015 at Unknown time  . Black Cohosh (REMIFEMIN) 20 MG TABS Take 20 mg by mouth 2 (two) times daily.   08/14/2015 at Unknown time  . Calcium Citrate (CITRACAL PO) Take 2 tablets by mouth daily.    08/14/2015 at Unknown time  . ferrous sulfate 325 (65 FE) MG tablet Take 325 mg by mouth daily with breakfast.   0 08/14/2015 at Unknown time  . furosemide (LASIX) 20 MG tablet Take 1-2 tablets once a day as needed for leg swelling (Patient taking differently: Take 20-40 mg by mouth daily. Take 1 tablet (20 mg) by mouth daily, may take an additional tablet (20 mg) as needed for feet swelling) 180 tablet 1 08/14/2015 at Unknown time  . glipiZIDE (GLUCOTROL) 10 MG tablet TAKE 1 TABLET TWICE DAILY BEFORE A MEAL 180 tablet 1 08/14/2015 at pm  . imipramine (TOFRANIL) 50 MG tablet Take 1 tablet (50 mg total) by mouth 3 (three) times daily. 270 tablet 1 08/14/2015 at pm  . JANUVIA 25 MG tablet TAKE ONE TABLET BY MOUTH ONCE DAILY (Patient taking differently: TAKE ONE TABLET BY MOUTH ONCE DAILY WITH BREAKFAST) 30 tablet 3 08/14/2015 at Unknown time  . lovastatin (MEVACOR) 20 MG tablet Take 10 mg by mouth at bedtime.   08/14/2015 at Unknown time  . metFORMIN (GLUCOPHAGE) 1000 MG tablet TAKE 1 TABLET TWICE DAILY WITH A MEAL (Patient taking differently: Take 1,000 mg by mouth 2 (two) times daily with a meal. ) 180 tablet 1 08/14/2015 at pm  . oxyCODONE (OXY IR/ROXICODONE) 5 MG immediate release tablet Take 1-3 tablets (5-15 mg total) by mouth every 4 (four) hours as needed. (Patient taking differently: Take  5-15 mg by mouth every 4 (four) hours as needed (pain). ) 90 tablet 0 unknown  . clindamycin (CLEOCIN) 300 MG capsule Take 1 capsule (300 mg total) by mouth 4 (four) times daily. X 7 days 28 capsule 0 not yet taken  . glucose blood (ACCU-CHEK AVIVA) test strip Use to monitor FSBS 1x daily. Dx: E11.9 50 each 6 07/10/2015 at Unknown time  . HYDROcodone-acetaminophen (NORCO/VICODIN) 5-325 MG per tablet Take 0.5-1 tablets by mouth every 4 (four) hours as needed for moderate pain. (Patient not taking: Reported on 08/15/2015) 20 tablet 0 Not  Taking at Unknown time  . lovastatin (MEVACOR) 10 MG tablet Take 1 tablet (10 mg total) by mouth at bedtime. (Patient not taking: Reported on 08/15/2015) 90 tablet 2 Not Taking at Unknown time   Scheduled:  . amLODipine  10 mg Oral Daily  . atenolol  50 mg Oral Daily  . benazepril  20 mg Oral Daily  . Chlorhexidine Gluconate Cloth  6 each Topical Q0600  . furosemide  20 mg Oral Daily  . glipiZIDE  10 mg Oral QAC breakfast  . imipramine  50 mg Oral TID  . insulin aspart  0-20 Units Subcutaneous TID WC  . insulin aspart  0-5 Units Subcutaneous QHS  . linagliptin  5 mg Oral Daily  . mupirocin ointment   Nasal BID  . pravastatin  10 mg Oral q1800  . vancomycin  1,000 mg Intravenous Q12H   Assessment: 74 y.o female with deep infection of left knee joint / surgical wound dehiscence of left knee status post partial patellectomy ~ 2 weeks ago. Wound culture from 08/14/15 growing staph aureus. Pharmacy consulted to dose IV vancomycin.   SCr = 0.67, estimated CrCl ~ 87 ml/min and WBC 10.2K on 08/16/15. Currently afebrile.   Received Vancomycin 1000 mg IV 9/9 in OR @ 18:29. Additional 1500 mg vanc LD ordered to be given 9/9 - was never given.  Vanc LD on 1 g to make up the missed 1500 mg LD was given 9/10 at 1300.  Goal of Therapy:  Vancomycin trough level 10-15 mcg/ml  Plan:  Continue Vanc 1000 mg IV q12h Monitor renal function q72h and clinical status daily.  Monitor  vancomycin trough at steady state.  Greggory Stallion, PharmD Clinical Pharmacy Resident Pager # [phone] 08/17/2015 11:38 AM

## 2015-08-17 NOTE — Evaluation (Signed)
Occupational Therapy Evaluation Patient Details Name: Lindsay Cobb MRN: [id number] DOB: Apr 28, 1941 Today's Date: 08/17/2015    History of Present Illness 74 y.o. female with L patellar fx sustained in a fall. She underwent partial patellectomy 07-11-15.  Returning for infection of L knee and thigh, with irrigation and debridement to excise necrotic tissue with L knee NWB and in immobilizer with no ROM.  Wound vac in place L thigh.      Clinical Impression   This 74 yo female admitted with above presents to acute OT with decreased balance, decreased mobility, increased pain, NWB'in LLE, and obesity all affecting her ability to care for herself at an independent level. She will benefit from acute OT with follow up OT at SNF to get to a Mod I level.    Follow Up Recommendations  SNF    Equipment Recommendations   (TBD next venue)       Precautions / Restrictions Precautions Precautions: Fall Precaution Comments: L knee in immobilizer Required Braces or Orthoses: Knee Immobilizer - Left Knee Immobilizer - Left: On at all times Restrictions Weight Bearing Restrictions: Yes LLE Weight Bearing: Non weight bearing      Mobility Bed Mobility Overal bed mobility: Needs Assistance;+2 for physical assistance Bed Mobility: Supine to Sit     Supine to sit: Mod assist;+2 for physical assistance        Transfers Overall transfer level: Needs assistance Equipment used: Rolling walker (2 wheeled) Transfers: Sit to/from [employer] Sit to Stand: Min assist;+2 physical assistance Stand pivot transfers: Mod assist;+2 physical assistance       General transfer comment: Pt able to maintain NWB'ing LLE today for stand pivot (however fatigued quicky)    Balance Overall balance assessment: Needs assistance Sitting-balance support: No upper extremity supported;Feet supported Sitting balance-Leahy Scale: Fair     Standing balance support: Bilateral upper extremity  supported Standing balance-Leahy Scale: Poor                              ADL Overall ADL's : Needs assistance/impaired Eating/Feeding: Independent;Sitting   Grooming: Set up;Sitting   Upper Body Bathing: Set up;Sitting   Lower Body Bathing: Maximal assistance (with min A +2 sit<>stand)   Upper Body Dressing : Set up;Sitting   Lower Body Dressing: Total assistance (with min A +2 sit<>stand)   Toilet Transfer: +2 for physical assistance;Moderate assistance;Stand-pivot;RW (bed>recliner)   Toileting- Clothing Manipulation and Hygiene: Total assistance (with min A +2 sit<>stand)               Vision Additional Comments: No change from baseline          Pertinent Vitals/Pain Pain Assessment: Faces Faces Pain Scale: Hurts even more Pain Location: left knee with bed mobility and bed to recliner with KI on Pain Descriptors / Indicators: Aching;Sore Pain Intervention(s): Monitored during session;Repositioned     Hand Dominance  right   Extremity/Trunk Assessment Upper Extremity Assessment Upper Extremity Assessment: Generalized weakness (to be able to hop)           Communication  no issues   Cognition Arousal/Alertness: Awake/alert Behavior During Therapy: Anxious Overall Cognitive Status: Within Functional Limits for tasks assessed       Memory: Decreased recall of precautions                        Home Living Family/patient expects to be discharged to:: Skilled nursing facility  OT Diagnosis: Generalized weakness;Acute pain   OT Problem List: Decreased strength;Decreased range of motion;Impaired balance (sitting and/or standing);Pain;Obesity;Decreased knowledge of use of DME or AE   OT Treatment/Interventions: Self-care/ADL training;Patient/family education;Balance training;Therapeutic activities;DME and/or AE instruction    OT Goals(Current goals can be found in the  care plan section) Acute Rehab OT Goals Patient Stated Goal: to rehab then home OT Goal Formulation: With patient Time For Goal Achievement: 08/24/15 Potential to Achieve Goals: Good  OT Frequency: Min 2X/week   Barriers to D/C: Decreased caregiver support             End of Session Equipment Utilized During Treatment: Gait belt;Rolling walker Nurse Communication:  (NT: pt stands well, but pivoting is hard thus move furniture to pt not pt to furniture)  Activity Tolerance: Patient limited by fatigue Patient left: in chair;with call bell/phone within reach   Time: 1027-1043 OT Time Calculation (min): 16 min Charges:  OT General Charges $OT Visit: 1 Procedure OT Evaluation $Initial OT Evaluation Tier I: 1 Procedure  Evette Georges [phone] 08/17/2015, 12:57 PM

## 2015-08-17 NOTE — Consult Note (Signed)
[facility]  Date of Admission:  08/15/2015  Date of Consult:  08/17/2015  Reason for Consult: Osteomyelitis, MRSA Referring Physician: [name]  Impression/Recommendation Osteomyelitis MRSA  Will continue vanco  Will place [facility]  Aim for 6 weeks of anbx.   Follow ESR and CRP as outpt markers ( 128 and 20.9 here)  Wound care  DM2  Aim for better control, have been near 200  Will check A1C  Thank you so much for this interesting consult,   Johny Sax (pager) [phone] [email or web address]  Lindsay Cobb is an 74 y.o. female.  HPI: 74 yo F with hx of DM2 ~ 5 yrs, L partial patellectomy 07-11-15 after fall. She returns on 9-8 with dehiscence of her wound. She developed wound drainage but denies erythema or streaking. She has been afebrile.  She was taken to OR on 9-9 and was felt to have deep infection of her knee joint as well as infection vs fat necrosis of her thigh, and ruptured tendon.   She was started on vancomycin post-operatively. Her op-cx are growing staph aureus. Her wound cx done prior to OR shows MRSA (R- bactrim, S- tet, rif, gent, vanco)  Past Medical History  Diagnosis Date  . Diabetes mellitus     takes Januvia,Metformin,and Glipizide daily  . Hyperlipidemia     takes Lovastatin nightly  . Anemia     takes Ferrous Gluconate daily  . Peripheral edema     takes Furosemide daily as needed  . Depression     takes Tofranil daily  . Hypertension     takes Amlodipine,Atenolol,and Benazepril daily  . Arthritis   . Joint pain   . Joint swelling   . Cataracts, bilateral     immature  . Infection of skin of knee     left    Past Surgical History  Procedure Laterality Date  . Breast biopsy Left   . Colonoscopy    . Esophagogastroduodenoscopy    . Ercp    . Patellectomy Left 07/11/2015    Procedure: Left Partial PATELLECTOMY;  Surgeon: Tarry Kos, MD;  Location: [facility] OR;  Service: Orthopedics;  Laterality: Left;     No  Known Allergies  Medications:  Scheduled: . amLODipine  10 mg Oral Daily  . atenolol  50 mg Oral Daily  . benazepril  20 mg Oral Daily  . Chlorhexidine Gluconate Cloth  6 each Topical Q0600  . furosemide  20 mg Oral Daily  . glipiZIDE  10 mg Oral QAC breakfast  . imipramine  50 mg Oral TID  . insulin aspart  0-20 Units Subcutaneous TID WC  . insulin aspart  0-5 Units Subcutaneous QHS  . linagliptin  5 mg Oral Daily  . mupirocin ointment   Nasal BID  . pravastatin  10 mg Oral q1800  . vancomycin  1,000 mg Intravenous Q12H    Abtx:  Anti-infectives    Start     Dose/Rate Route Frequency Ordered Stop   08/16/15 1330  vancomycin (VANCOCIN) IVPB 1000 mg/200 mL premix    Comments:  To be given in addition to 1 g given this AM.   1,000 mg 200 mL/hr over 60 Minutes Intravenous  Once 08/16/15 1211 08/16/15 1356   08/15/15 1909  vancomycin (VANCOCIN) powder  Status:  Discontinued       As needed 08/15/15 1909 08/15/15 2004   08/15/15 1000  vancomycin (VANCOCIN) IVPB 1000 mg/200 mL premix     1,000 mg  200 mL/hr over 60 Minutes Intravenous Every 12 hours 08/15/15 2313     08/15/15 0015  vancomycin (VANCOCIN) 1,500 mg in sodium chloride 0.9 % 500 mL IVPB     1,500 mg 250 mL/hr over 120 Minutes Intravenous NOW 08/15/15 2313 08/16/15 0015      Total days of antibiotics: 2 vanco          Social History:  reports that she has never smoked. She has never used smokeless tobacco. She reports that she does not drink alcohol or use illicit drugs.  Family History  Problem Relation Age of Onset  . Hypertension Mother   . Diabetes Mother   . Hypertension Father     General ROS: no visual changes, no ophtho this year, eating well, wt steady, no sob, no cough, normal BM, normal urine, no neuropathy. 12 point ros o/w (-).   Blood pressure 137/55, pulse 84, temperature 98.7 F (37.1 C), temperature source Oral, resp. rate 16, height 5\' 10"  (1.778 m), weight 115.667 kg (255 lb), SpO2 97  %. General appearance: alert, cooperative and no distress Eyes: negative findings: pupils equal, round, reactive to light and accomodation Throat: normal findings: oropharynx pink & moist without lesions or evidence of thrush Neck: no adenopathy and supple, symmetrical, trachea midline Lungs: clear to auscultation bilaterally Heart: regular rate and rhythm Abdomen: normal findings: bowel sounds normal and soft, non-tender Extremities: edema trace. LLE wrapped, drain in place.  Skin: no diabetic foot lesions on toes, forefoot.  Neurologic: Sensory: grossly NL BLE.    Results for orders placed or performed during the hospital encounter of 08/15/15 (from the past 48 hour(s))  Glucose, capillary     Status: Abnormal   Collection Time: 08/15/15  4:19 PM  Result Value Ref Range   Glucose-Capillary 134 (H) 65 - 99 mg/dL  Glucose, capillary     Status: Abnormal   Collection Time: 08/15/15  5:59 PM  Result Value Ref Range   Glucose-Capillary 126 (H) 65 - 99 mg/dL  Culture, routine-abscess     Status: None (Preliminary result)   Collection Time: 08/15/15  6:32 PM  Result Value Ref Range   Specimen Description ABSCESS LEFT KNEE A    Special Requests NONE    Gram Stain      NO WBC SEEN NO SQUAMOUS EPITHELIAL CELLS SEEN NO ORGANISMS SEEN Performed at 10/15/15    Culture      MODERATE STAPHYLOCOCCUS AUREUS Note: RIFAMPIN AND GENTAMICIN SHOULD NOT BE USED AS SINGLE DRUGS FOR TREATMENT OF STAPH INFECTIONS. Performed at [facility]    Report Status PENDING   Culture, routine-abscess     Status: None (Preliminary result)   Collection Time: 08/15/15  6:32 PM  Result Value Ref Range   Specimen Description ABSCESS LEFT THIGH B    Special Requests NONE    Gram Stain      NO WBC SEEN NO SQUAMOUS EPITHELIAL CELLS SEEN NO ORGANISMS SEEN Performed at 10/15/15    Culture      RARE STAPHYLOCOCCUS AUREUS Note: RIFAMPIN AND GENTAMICIN SHOULD NOT BE USED AS  SINGLE DRUGS FOR TREATMENT OF STAPH INFECTIONS. Performed at [facility]    Report Status PENDING   Glucose, capillary     Status: Abnormal   Collection Time: 08/15/15  8:11 PM  Result Value Ref Range   Glucose-Capillary 171 (H) 65 - 99 mg/dL   Comment 1 Notify RN    Comment 2 Document in Chart  Glucose, capillary     Status: Abnormal   Collection Time: 08/15/15 10:12 PM  Result Value Ref Range   Glucose-Capillary 204 (H) 65 - 99 mg/dL  Comprehensive metabolic panel     Status: Abnormal   Collection Time: 08/16/15  4:59 AM  Result Value Ref Range   Sodium 136 135 - 145 mmol/L   Potassium 4.7 3.5 - 5.1 mmol/L   Chloride 100 (L) 101 - 111 mmol/L   CO2 27 22 - 32 mmol/L   Glucose, Bld 181 (H) 65 - 99 mg/dL   BUN 11 6 - 20 mg/dL   Creatinine, Ser 0.67 0.44 - 1.00 mg/dL   Calcium 8.7 (L) 8.9 - 10.3 mg/dL   Total Protein 6.5 6.5 - 8.1 g/dL   Albumin 1.9 (L) 3.5 - 5.0 g/dL   AST 18 15 - 41 U/L   ALT 17 14 - 54 U/L   Alkaline Phosphatase 64 38 - 126 U/L   Total Bilirubin 0.7 0.3 - 1.2 mg/dL   GFR calc non Af Amer >60 >60 mL/min   GFR calc Af Amer >60 >60 mL/min    Comment: (NOTE) The eGFR has been calculated using the CKD EPI equation. This calculation has not been validated in all clinical situations. eGFR's persistently <60 mL/min signify possible Chronic Kidney Disease.    Anion gap 9 5 - 15  C-reactive protein     Status: Abnormal   Collection Time: 08/16/15  4:59 AM  Result Value Ref Range   CRP 20.9 (H) <1.0 mg/dL  Sedimentation rate     Status: Abnormal   Collection Time: 08/16/15  4:59 AM  Result Value Ref Range   Sed Rate 128 (H) 0 - 22 mm/hr  CBC with Differential/Platelet     Status: Abnormal   Collection Time: 08/16/15  4:59 AM  Result Value Ref Range   WBC 10.2 4.0 - 10.5 K/uL   RBC 3.29 (L) 3.87 - 5.11 MIL/uL   Hemoglobin 9.2 (L) 12.0 - 15.0 g/dL   HCT 27.7 (L) 36.0 - 46.0 %   MCV 84.2 78.0 - 100.0 fL   MCH 28.0 26.0 - 34.0 pg   MCHC 33.2 30.0  - 36.0 g/dL   RDW 15.5 11.5 - 15.5 %   Platelets 406 (H) 150 - 400 K/uL   Neutrophils Relative % 69 43 - 77 %   Neutro Abs 7.1 1.7 - 7.7 K/uL   Lymphocytes Relative 17 12 - 46 %   Lymphs Abs 1.7 0.7 - 4.0 K/uL   Monocytes Relative 12 3 - 12 %   Monocytes Absolute 1.2 (H) 0.1 - 1.0 K/uL   Eosinophils Relative 2 0 - 5 %   Eosinophils Absolute 0.2 0.0 - 0.7 K/uL   Basophils Relative 0 0 - 1 %   Basophils Absolute 0.0 0.0 - 0.1 K/uL  Glucose, capillary     Status: Abnormal   Collection Time: 08/16/15  6:30 AM  Result Value Ref Range   Glucose-Capillary 150 (H) 65 - 99 mg/dL  Glucose, capillary     Status: Abnormal   Collection Time: 08/16/15 11:43 AM  Result Value Ref Range   Glucose-Capillary 210 (H) 65 - 99 mg/dL   Comment 1 Notify RN   Glucose, capillary     Status: Abnormal   Collection Time: 08/16/15  4:41 PM  Result Value Ref Range   Glucose-Capillary 209 (H) 65 - 99 mg/dL   Comment 1 Notify RN    Comment 2 Document in  Chart   Glucose, capillary     Status: Abnormal   Collection Time: 08/16/15  9:03 PM  Result Value Ref Range   Glucose-Capillary 109 (H) 65 - 99 mg/dL  Glucose, capillary     Status: Abnormal   Collection Time: 08/17/15  6:33 AM  Result Value Ref Range   Glucose-Capillary 154 (H) 65 - 99 mg/dL  Vancomycin, trough     Status: None   Collection Time: 08/17/15 10:30 AM  Result Value Ref Range   Vancomycin Tr 13 10.0 - 20.0 ug/mL  Glucose, capillary     Status: Abnormal   Collection Time: 08/17/15 11:24 AM  Result Value Ref Range   Glucose-Capillary 194 (H) 65 - 99 mg/dL   Comment 1 Repeat Test    Comment 2 Document in Chart       Component Value Date/Time   SDES ABSCESS LEFT KNEE A 08/15/2015 1832   SDES ABSCESS LEFT THIGH B 08/15/2015 1832   SPECREQUEST NONE 08/15/2015 1832   SPECREQUEST NONE 08/15/2015 1832   CULT  08/15/2015 1832    MODERATE STAPHYLOCOCCUS AUREUS Note: RIFAMPIN AND GENTAMICIN SHOULD NOT BE USED AS SINGLE DRUGS FOR TREATMENT OF  STAPH INFECTIONS. Performed at [place]  08/15/2015 1832    RARE STAPHYLOCOCCUS AUREUS Note: RIFAMPIN AND GENTAMICIN SHOULD NOT BE USED AS SINGLE DRUGS FOR TREATMENT OF STAPH INFECTIONS. Performed at [place] PENDING 08/15/2015 1832   REPTSTATUS PENDING 08/15/2015 1832   No results found. Recent Results (from the past 240 hour(s))  Wound culture     Status: None   Collection Time: 08/13/15 11:00 PM  Result Value Ref Range Status   Specimen Description WOUND KNEE  Final   Special Requests NONE  Final   Gram Stain   Final    NO WBC SEEN NO SQUAMOUS EPITHELIAL CELLS SEEN FEW GRAM POSITIVE COCCI IN PAIRS Performed at [facility]    Culture   Final    MODERATE METHICILLIN RESISTANT STAPHYLOCOCCUS AUREUS Note: RIFAMPIN AND GENTAMICIN SHOULD NOT BE USED AS SINGLE DRUGS FOR TREATMENT OF STAPH INFECTIONS. This organism DOES NOT demonstrate inducible Clindamycin resistance in vitro. CRITICAL RESULT CALLED TO, READ BACK BY AND VERIFIED WITH: CONSTANCE F 9/10  $Rem'@1055'BhnU$  BY REAMM Performed at [facility]    Report Status 08/16/2015 FINAL  Final   Organism ID, Bacteria METHICILLIN RESISTANT STAPHYLOCOCCUS AUREUS  Final      Susceptibility   Methicillin resistant staphylococcus aureus - MIC*    CLINDAMYCIN <=0.25 SENSITIVE Sensitive     ERYTHROMYCIN >=8 RESISTANT Resistant     GENTAMICIN <=0.5 SENSITIVE Sensitive     LEVOFLOXACIN >=8 RESISTANT Resistant     OXACILLIN >=4 RESISTANT Resistant     RIFAMPIN <=0.5 SENSITIVE Sensitive     TRIMETH/SULFA >=320 RESISTANT Resistant     VANCOMYCIN 1 SENSITIVE Sensitive     TETRACYCLINE <=1 SENSITIVE Sensitive     * MODERATE METHICILLIN RESISTANT STAPHYLOCOCCUS AUREUS  Culture, routine-abscess     Status: None (Preliminary result)   Collection Time: 08/15/15  6:32 PM  Result Value Ref Range Status   Specimen Description ABSCESS LEFT KNEE A  Final   Special Requests NONE  Final   Gram  Stain   Final    NO WBC SEEN NO SQUAMOUS EPITHELIAL CELLS SEEN NO ORGANISMS SEEN Performed at [facility]    Culture   Final    MODERATE STAPHYLOCOCCUS AUREUS Note: RIFAMPIN AND GENTAMICIN  SHOULD NOT BE USED AS SINGLE DRUGS FOR TREATMENT OF STAPH INFECTIONS. Performed at [facility]    Report Status PENDING  Incomplete  Culture, routine-abscess     Status: None (Preliminary result)   Collection Time: 08/15/15  6:32 PM  Result Value Ref Range Status   Specimen Description ABSCESS LEFT THIGH B  Final   Special Requests NONE  Final   Gram Stain   Final    NO WBC SEEN NO SQUAMOUS EPITHELIAL CELLS SEEN NO ORGANISMS SEEN Performed at [facility]    Culture   Final    RARE STAPHYLOCOCCUS AUREUS Note: RIFAMPIN AND GENTAMICIN SHOULD NOT BE USED AS SINGLE DRUGS FOR TREATMENT OF STAPH INFECTIONS. Performed at [facility]    Report Status PENDING  Incomplete      08/17/2015, 2:35 PM     LOS: 2 days    Records and images were personally reviewed where available.

## 2015-08-18 ENCOUNTER — Encounter (HOSPITAL_COMMUNITY): Payer: Self-pay | Admitting: Orthopaedic Surgery

## 2015-08-18 ENCOUNTER — Inpatient Hospital Stay (HOSPITAL_COMMUNITY): Payer: Commercial Managed Care - HMO | Admitting: Anesthesiology

## 2015-08-18 ENCOUNTER — Encounter (HOSPITAL_COMMUNITY)
Admission: RE | Disposition: A | Discharge status: Skilled Nursing Facility | Payer: Self-pay | Source: Ambulatory Visit | Attending: Orthopaedic Surgery

## 2015-08-18 HISTORY — PX: I & D EXTREMITY: SHX5045

## 2015-08-18 HISTORY — PX: MINOR APPLICATION OF WOUND VAC: SHX6243

## 2015-08-18 LAB — CULTURE, ROUTINE-ABSCESS
GRAM STAIN: NONE SEEN
Gram Stain: NONE SEEN

## 2015-08-18 LAB — GLUCOSE, CAPILLARY
GLUCOSE-CAPILLARY: 147 mg/dL — AB (ref 65–99)
GLUCOSE-CAPILLARY: 137 mg/dL — AB (ref 65–99)
GLUCOSE-CAPILLARY: 130 mg/dL — AB (ref 65–99)
GLUCOSE-CAPILLARY: 157 mg/dL — AB (ref 65–99)
Glucose-Capillary: 175 mg/dL — ABNORMAL HIGH (ref 65–99)

## 2015-08-18 LAB — HEMOGLOBIN A1C
Hgb A1c MFr Bld: 7.3 % — ABNORMAL HIGH (ref 4.8–5.6)
Mean Plasma Glucose: 163 mg/dL

## 2015-08-18 SURGERY — IRRIGATION AND DEBRIDEMENT EXTREMITY
Anesthesia: General | Site: Knee | Laterality: Left

## 2015-08-18 MED ORDER — CEFAZOLIN SODIUM-DEXTROSE 2-3 GM-% IV SOLR
INTRAVENOUS | Status: DC | PRN
  Administered 2015-08-18: 2 g via INTRAVENOUS

## 2015-08-18 MED ORDER — FENTANYL CITRATE (PF) 100 MCG/2ML IJ SOLN
INTRAMUSCULAR | Status: DC | PRN
  Administered 2015-08-18: 50 ug via INTRAVENOUS
  Administered 2015-08-18: 100 ug via INTRAVENOUS
  Administered 2015-08-18 (×2): 50 ug via INTRAVENOUS

## 2015-08-18 MED ORDER — ONDANSETRON HCL 4 MG/2ML IJ SOLN
INTRAMUSCULAR | Status: DC | PRN
  Administered 2015-08-18: 4 mg via INTRAVENOUS

## 2015-08-18 MED ORDER — LACTATED RINGERS IV SOLN
INTRAVENOUS | Status: DC | PRN
  Administered 2015-08-18: 16:00:00 via INTRAVENOUS

## 2015-08-18 MED ORDER — SODIUM CHLORIDE 0.9 % IJ SOLN: 10.0000 mL | INTRAMUSCULAR | Status: DC | PRN

## 2015-08-18 MED ORDER — CEFAZOLIN SODIUM-DEXTROSE 2-3 GM-% IV SOLR
INTRAVENOUS | Status: AC
  Filled 2015-08-18: qty 50

## 2015-08-18 MED ORDER — FENTANYL CITRATE (PF) 100 MCG/2ML IJ SOLN: 25.0000 ug | INTRAMUSCULAR | Status: DC | PRN

## 2015-08-18 MED ORDER — PROMETHAZINE HCL 25 MG/ML IJ SOLN: 6.2500 mg | INTRAMUSCULAR | Status: DC | PRN

## 2015-08-18 MED ORDER — FENTANYL CITRATE (PF) 250 MCG/5ML IJ SOLN
INTRAMUSCULAR | Status: AC
  Filled 2015-08-18: qty 5

## 2015-08-18 MED ORDER — LIDOCAINE HCL (CARDIAC) 20 MG/ML IV SOLN
INTRAVENOUS | Status: DC | PRN
  Administered 2015-08-18: 50 mg via INTRAVENOUS
  Administered 2015-08-18: 60 mg via INTRAVENOUS

## 2015-08-18 MED ORDER — OXYCODONE HCL 5 MG/5ML PO SOLN
ORAL | Status: AC
  Filled 2015-08-18: qty 5

## 2015-08-18 MED ORDER — OXYCODONE HCL 5 MG/5ML PO SOLN
5.0000 mg | Freq: Once | ORAL | Status: AC | PRN
  Administered 2015-08-18: 5 mg via ORAL

## 2015-08-18 MED ORDER — PROPOFOL 10 MG/ML IV BOLUS
INTRAVENOUS | Status: DC | PRN
  Administered 2015-08-18: 200 mg via INTRAVENOUS

## 2015-08-18 MED ORDER — ASPIRIN EC 325 MG PO TBEC
325.0000 mg | DELAYED_RELEASE_TABLET | Freq: Two times a day (BID) | ORAL | Status: DC
  Administered 2015-08-18 – 2015-08-21 (×7): 325 mg via ORAL
  Filled 2015-08-18 (×7): qty 1

## 2015-08-18 MED ORDER — OXYCODONE HCL 5 MG PO TABS: 5.0000 mg | ORAL_TABLET | Freq: Once | ORAL | Status: AC | PRN

## 2015-08-18 MED ORDER — ONDANSETRON HCL 4 MG/2ML IJ SOLN
INTRAMUSCULAR | Status: AC
  Filled 2015-08-18: qty 4

## 2015-08-18 SURGICAL SUPPLY — 64 items
BANDAGE ELASTIC 3 VELCRO ST LF (GAUZE/BANDAGES/DRESSINGS) IMPLANT
BLADE SURG 10 STRL SS (BLADE) ×4 IMPLANT
BNDG COHESIVE 1X5 TAN STRL LF (GAUZE/BANDAGES/DRESSINGS) IMPLANT
BNDG COHESIVE 4X5 TAN STRL (GAUZE/BANDAGES/DRESSINGS) ×4 IMPLANT
BNDG COHESIVE 6X5 TAN STRL LF (GAUZE/BANDAGES/DRESSINGS) ×8 IMPLANT
BNDG CONFORM 3 STRL LF (GAUZE/BANDAGES/DRESSINGS) IMPLANT
BNDG GAUZE STRTCH 6 (GAUZE/BANDAGES/DRESSINGS) ×12 IMPLANT
CANISTER WOUND CARE 500ML ATS (WOUND CARE) ×2 IMPLANT
CORDS BIPOLAR (ELECTRODE) IMPLANT
COVER SURGICAL LIGHT HANDLE (MISCELLANEOUS) ×4 IMPLANT
CUFF TOURNIQUET SINGLE 24IN (TOURNIQUET CUFF) IMPLANT
CUFF TOURNIQUET SINGLE 34IN LL (TOURNIQUET CUFF) ×10 IMPLANT
CUFF TOURNIQUET SINGLE 44IN (TOURNIQUET CUFF) IMPLANT
DRAPE EXTREMITY BILATERAL (DRAPE) IMPLANT
DRAPE IMP U-DRAPE 54X76 (DRAPES) IMPLANT
DRAPE INCISE IOBAN 66X45 STRL (DRAPES) ×16 IMPLANT
DRAPE SURG 17X23 STRL (DRAPES) IMPLANT
DRAPE U-SHAPE 47X51 STRL (DRAPES) ×4 IMPLANT
DRSG MEPITEL 4X7.2 (GAUZE/BANDAGES/DRESSINGS) ×2 IMPLANT
DRSG VAC ATS SM SENSATRAC (GAUZE/BANDAGES/DRESSINGS) ×2 IMPLANT
DURAPREP 26ML APPLICATOR (WOUND CARE) ×4 IMPLANT
ELECT CAUTERY BLADE 6.4 (BLADE) ×4 IMPLANT
ELECT REM PT RETURN 9FT ADLT (ELECTROSURGICAL)
ELECTRODE REM PT RTRN 9FT ADLT (ELECTROSURGICAL) IMPLANT
FACESHIELD WRAPAROUND (MASK) IMPLANT
FACESHIELD WRAPAROUND OR TEAM (MASK) IMPLANT
GAUZE SPONGE 4X4 12PLY STRL (GAUZE/BANDAGES/DRESSINGS) ×8 IMPLANT
GAUZE XEROFORM 1X8 LF (GAUZE/BANDAGES/DRESSINGS) ×4 IMPLANT
GAUZE XEROFORM 5X9 LF (GAUZE/BANDAGES/DRESSINGS) ×4 IMPLANT
GLOVE NEODERM STRL 7.5 LF PF (GLOVE) ×4 IMPLANT
GLOVE ORTHOPEDIC STR SZ6.5 (GLOVE) ×2 IMPLANT
GLOVE SURG NEODERM 7.5  LF PF (GLOVE) ×4
GOWN STRL REIN XL XLG (GOWN DISPOSABLE) ×8 IMPLANT
HANDPIECE INTERPULSE COAX TIP (DISPOSABLE)
KIT BASIN OR (CUSTOM PROCEDURE TRAY) ×4 IMPLANT
KIT ROOM TURNOVER OR (KITS) ×4 IMPLANT
MANIFOLD NEPTUNE II (INSTRUMENTS) ×4 IMPLANT
NS IRRIG 1000ML POUR BTL (IV SOLUTION) ×8 IMPLANT
PACK ORTHO EXTREMITY (CUSTOM PROCEDURE TRAY) ×4 IMPLANT
PAD ABD 8X10 STRL (GAUZE/BANDAGES/DRESSINGS) ×4 IMPLANT
PAD ARMBOARD 7.5X6 YLW CONV (MISCELLANEOUS) ×8 IMPLANT
PADDING CAST ABS 4INX4YD NS (CAST SUPPLIES) ×4
PADDING CAST ABS COTTON 4X4 ST (CAST SUPPLIES) ×4 IMPLANT
PADDING CAST COTTON 6X4 STRL (CAST SUPPLIES) ×4 IMPLANT
SET CYSTO W/LG BORE CLAMP LF (SET/KITS/TRAYS/PACK) ×2 IMPLANT
SET HNDPC FAN SPRY TIP SCT (DISPOSABLE) IMPLANT
SPONGE LAP 18X18 X RAY DECT (DISPOSABLE) ×8 IMPLANT
STOCKINETTE IMPERVIOUS 9X36 MD (GAUZE/BANDAGES/DRESSINGS) ×4 IMPLANT
SUT ETHILON 2 0 FS 18 (SUTURE) ×12 IMPLANT
SUT ETHILON 2 0 PSLX (SUTURE) ×4 IMPLANT
SUT ETHILON 3 0 PS 1 (SUTURE) ×8 IMPLANT
SUT VIC AB 2-0 CT1 36 (SUTURE) ×4 IMPLANT
SUT VIC AB 2-0 FS1 27 (SUTURE) ×8 IMPLANT
SYR CONTROL 10ML LL (SYRINGE) IMPLANT
TOWEL OR 17X24 6PK STRL BLUE (TOWEL DISPOSABLE) ×4 IMPLANT
TOWEL OR 17X26 10 PK STRL BLUE (TOWEL DISPOSABLE) ×4 IMPLANT
TUBE ANAEROBIC SPECIMEN COL (MISCELLANEOUS) IMPLANT
TUBE CONNECTING 12'X1/4 (SUCTIONS) ×1
TUBE CONNECTING 12X1/4 (SUCTIONS) ×3 IMPLANT
TUBE FEEDING 5FR 15 INCH (TUBING) IMPLANT
TUBING CYSTO DISP (UROLOGICAL SUPPLIES) ×4 IMPLANT
UNDERPAD 30X30 INCONTINENT (UNDERPADS AND DIAPERS) ×8 IMPLANT
WATER STERILE IRR 1000ML POUR (IV SOLUTION) ×4 IMPLANT
YANKAUER SUCT BULB TIP NO VENT (SUCTIONS) ×4 IMPLANT

## 2015-08-18 NOTE — Transfer of Care (Signed)
Immediate Anesthesia Transfer of Care Note  Patient: Lindsay Cobb  Procedure(s) Performed: Procedure(s): IRRIGATION AND DEBRIDEMENT EXTREMITY (Left) MINOR APPLICATION OF WOUND VAC  Patient Location: PACU  Anesthesia Type:General  Level of Consciousness: awake, alert , oriented, patient cooperative and responds to stimulation  Airway & Oxygen Therapy: Patient Spontanous Breathing and Patient connected to nasal cannula oxygen  Post-op Assessment: Report given to RN, Post -op Vital signs reviewed and stable, Patient moving all extremities and Patient moving all extremities X 4  Post vital signs: Reviewed and stable  Last Vitals:  Filed Vitals:   08/18/15 1319  BP: 142/52  Pulse: 65  Temp: 37.1 C  Resp: 18    Complications: No apparent anesthesia complications

## 2015-08-18 NOTE — Anesthesia Preprocedure Evaluation (Addendum)
Anesthesia Evaluation  Patient identified by MRN, date of birth, ID band Patient awake    Reviewed: Allergy & Precautions, NPO status , Patient's Chart, lab work & pertinent test results, reviewed documented beta blocker date and time   Airway Mallampati: II       Dental  (+) Edentulous Upper, Edentulous Lower   Pulmonary neg pulmonary ROS,    breath sounds clear to auscultation       Cardiovascular hypertension, Pt. on medications  Rhythm:Regular Rate:Normal     Neuro/Psych PSYCHIATRIC DISORDERS Anxiety negative neurological ROS     GI/Hepatic negative GI ROS, Neg liver ROS,   Endo/Other  diabetes, Type 2  Renal/GU negative Renal ROS  negative genitourinary   Musculoskeletal  (+) Arthritis ,   Abdominal (+) + obese,   Peds negative pediatric ROS (+)  Hematology negative hematology ROS (+)   Anesthesia Other Findings   Reproductive/Obstetrics                            Lab Results  Component Value Date   WBC 10.2 08/16/2015   HGB 9.2* 08/16/2015   HCT 27.7* 08/16/2015   MCV 84.2 08/16/2015   PLT 406* 08/16/2015   Lab Results  Component Value Date   CREATININE 0.67 08/16/2015   BUN 11 08/16/2015   NA 136 08/16/2015   K 4.7 08/16/2015   CL 100* 08/16/2015   CO2 27 08/16/2015   EKG: normal sinus rhythm.   Anesthesia Physical  Anesthesia Plan  ASA: II  Anesthesia Plan: General   Post-op Pain Management:    Induction:   Airway Management Planned: LMA  Additional Equipment:   Intra-op Plan:   Post-operative Plan: Extubation in OR  Informed Consent: I have reviewed the patients History and Physical, chart, labs and discussed the procedure including the risks, benefits and alternatives for the proposed anesthesia with the patient or authorized representative who has indicated his/her understanding and acceptance.   Dental advisory given  Plan Discussed with: CRNA,  Anesthesiologist and Surgeon  Anesthesia Plan Comments: ( )       Anesthesia Quick Evaluation

## 2015-08-18 NOTE — Progress Notes (Signed)
   Subjective:  Patient reports pain as moderate.   Objective:   VITALS:   Filed Vitals:   08/17/15 1020 08/17/15 1500 08/17/15 1958 08/18/15 0642  BP: 137/55 137/49 134/55 148/67  Pulse: 84 72 72 87  Temp: 98.7 F (37.1 C) 97.4 F (36.3 C) 98.9 F (37.2 C) 99.1 F (37.3 C)  TempSrc: Oral  Oral Oral  Resp: Height:      Weight:      SpO2: 97% 99% 99% 100%    KI in place VAC in place Foot wwp, NVI   Lab Results  Component Value Date   WBC 10.2 08/16/2015   HGB 9.2* 08/16/2015   HCT 27.7* 08/16/2015   MCV 84.2 08/16/2015   PLT 406* 08/16/2015     Assessment/Plan:  3 Days Post-Op   - Expected postop acute blood loss anemia - will monitor for symptoms - Up with PT/OT - DVT ppx - SCDs, ambulation, asa - NWB operative extremity - Pain control - plan for repeat I&D this pm - NPO  Cheral Almas 08/18/2015, 7:36 AM [phone]

## 2015-08-18 NOTE — Progress Notes (Signed)
Patient admitted 5n21 from OR , VSS, a/o  Wound vac Left in place left knee

## 2015-08-18 NOTE — Progress Notes (Signed)
PT Cancellation Note  Patient Details Name: AARION KITTRELL MRN: [id number] DOB: [birth year]   Cancelled Treatment:    Reason Eval/Treat Not Completed: Other (comment) Pt going to OR for surgery. Will follow up next available time.   Blake Divine A Evalie Hargraves 08/18/2015, 2:56 PM  Mylo Red, PT, DPT [phone]

## 2015-08-18 NOTE — Anesthesia Procedure Notes (Signed)
Procedure Name: LMA Insertion Date/Time: 08/18/2015 4:19 PM Performed by: Wray Kearns A Pre-anesthesia Checklist: Patient identified, Timeout performed, Emergency Drugs available, Suction available and Patient being monitored Patient Re-evaluated:Patient Re-evaluated prior to inductionOxygen Delivery Method: Circle system utilized Preoxygenation: Pre-oxygenation with 100% oxygen Intubation Type: IV induction Ventilation: Mask ventilation without difficulty LMA: LMA inserted LMA Size: 4.0 Tube type: Oral Number of attempts: 1 Placement Confirmation: breath sounds checked- equal and bilateral and positive ETCO2 Tube secured with: Tape Dental Injury: Teeth and Oropharynx as per pre-operative assessment

## 2015-08-18 NOTE — Op Note (Signed)
   Date of surgery: 08/18/2015  Preoperative diagnosis: Left knee wound s/p irrigation and debridement  Postoperative diagnosis: same  Procedure: 1. Irrigation and debridement of left knee wound with debridement of bone, tendon, subcutaneous tissue 16 cm 2. Adjacent tissue rearrangement left knee 2 x 2 centimeters 3. Application of negative wound therapy less than 50 cm  Surgeon: Glee Arvin, M.D.  Estimated blood loss: Minimal  Complications: None  Anesthesia: Gen.  Condition to PACU: Stable  Indications for procedure: Mr. Lindsay Cobb returns today for repeat washout of her wound and possible closure. She was aware the risks benefits alternatives surgery she was to proceed.  Description of procedure: The patient was identified in the preoperative holding area. The operative site was marked by the surgeon confirmed with the patient. She is brought back to the operating room. She was placed supine on table. Gen. anesthesia was administered. A nonsterile tourniquet was placed on the upper left thigh. The operative extremity was prepped and draped in standard sterile fashion. Timeout was performed. Preoperative antibiotics were given. We performed sharp excisional debridement of the bone, tendon, subcutaneous tissue with a curette. There was healthy the feet red granulating tissue within the wound bed. There was no signs of continued infection. The tendon repair site was intact. We then thoroughly irrigated the wound with 3 L of normal saline. I then performed adjacent tissue rearrangement to partially close some of the wound. I placed a wound VAC on the rest of the wound and on top of the incision. This was turned to -125 mmHg. The patient tolerated the procedure well and was expanded and transferred to the PACU in stable condition.  Disposition: Patient will return back to the operating room on Wednesday for repeat I&D and placement of Integra.  She will ultimately need a skin graft.  Lindsay Reel, MD [facility] [phone] 5:05 PM

## 2015-08-18 NOTE — Progress Notes (Signed)
Peripherally Inserted Central Catheter/Midline Placement  The IV Nurse has discussed with the patient and/or persons authorized to consent for the patient, the purpose of this procedure and the potential benefits and risks involved with this procedure.  The benefits include less needle sticks, lab draws from the catheter and patient may be discharged home with the catheter.  Risks include, but not limited to, infection, bleeding, blood clot (thrombus formation), and puncture of an artery; nerve damage and irregular heat beat.  Alternatives to this procedure were also discussed.  Consent obtained by Reginia Forts, RN  PICC/Midline Placement Documentation        Lindsay Cobb, Lindsay Cobb 08/18/2015, 12:47 PM

## 2015-08-18 NOTE — Progress Notes (Signed)
Report called to short stay. 

## 2015-08-18 NOTE — H&P (Signed)
H&P update  The surgical history has been reviewed and remains accurate without interval change.  The patient was re-examined and patient's physiologic condition has not changed significantly in the last 30 days. The condition still exists that makes this procedure necessary. The treatment plan remains the same, without new options for care.  No new pharmacological allergies or types of therapy has been initiated that would change the plan or the appropriateness of the plan.  The patient and/or family understand the potential benefits and risks.  Mayra Reel, MD 08/18/2015 7:20 AM

## 2015-08-19 ENCOUNTER — Encounter (HOSPITAL_COMMUNITY): Payer: Self-pay | Admitting: Orthopaedic Surgery

## 2015-08-19 DIAGNOSIS — Z959 Presence of cardiac and vascular implant and graft, unspecified: Secondary | ICD-10-CM

## 2015-08-19 DIAGNOSIS — T8131XD Disruption of external operation (surgical) wound, not elsewhere classified, subsequent encounter: Secondary | ICD-10-CM

## 2015-08-19 LAB — BASIC METABOLIC PANEL
Anion gap: 7 (ref 5–15)
BUN: 8 mg/dL (ref 6–20)
CALCIUM: 8.9 mg/dL (ref 8.9–10.3)
CO2: 28 mmol/L (ref 22–32)
CREATININE: 0.7 mg/dL (ref 0.44–1.00)
Chloride: 98 mmol/L — ABNORMAL LOW (ref 101–111)
GFR calc Af Amer: >60 mL/min (ref >60)
GLUCOSE: 167 mg/dL — AB (ref 65–99)
POTASSIUM: 4.2 mmol/L (ref 3.5–5.1)
SODIUM: 133 mmol/L — AB (ref 135–145)

## 2015-08-19 LAB — GLUCOSE, CAPILLARY
GLUCOSE-CAPILLARY: 207 mg/dL — AB (ref 65–99)
GLUCOSE-CAPILLARY: 145 mg/dL — AB (ref 65–99)
Glucose-Capillary: 177 mg/dL — ABNORMAL HIGH (ref 65–99)
Glucose-Capillary: 192 mg/dL — ABNORMAL HIGH (ref 65–99)

## 2015-08-19 LAB — VANCOMYCIN, TROUGH: VANCOMYCIN TR: 15 ug/mL (ref 10.0–20.0)

## 2015-08-19 NOTE — Progress Notes (Signed)
Physical Therapy Treatment Patient Details Name: Lindsay Cobb MRN: [id number] DOB: 04-04-1941 Today's Date: 08/19/2015    History of Present Illness 74 y.o. female with L patellar fx sustained in a fall. She underwent partial patellectomy 07-11-15.  Returning for infection of L knee and thigh, with irrigation and debridement to excise necrotic tissue with L knee NWB and in immobilizer with no ROM.  Wound vac in place L thigh.       PT Comments    Pt is scheduled to return to the OR tomorrow.  Follow Up Recommendations  SNF     Equipment Recommendations  None recommended by PT    Recommendations for Other Services       Precautions / Restrictions Precautions Precautions: Fall Required Braces or Orthoses: Knee Immobilizer - Left Knee Immobilizer - Left: On at all times Restrictions LLE Weight Bearing: Non weight bearing    Mobility  Bed Mobility         Supine to sit: Mod assist;+2 for physical assistance;HOB elevated        Transfers   Equipment used: Rolling walker (2 wheeled)   Sit to Stand: Mod assist;+2 physical assistance Stand pivot transfers: Mod assist;+2 physical assistance       General transfer comment: Pt able to maintain NWB'ing LLE for stand pivot but she fatigued quicky.  Ambulation/Gait                 Stairs            Wheelchair Mobility    Modified Rankin (Stroke Patients Only)       Balance                                    Cognition Arousal/Alertness: Awake/alert Behavior During Therapy: WFL for tasks assessed/performed Overall Cognitive Status: Within Functional Limits for tasks assessed       Memory: Decreased recall of precautions              Exercises      General Comments        Pertinent Vitals/Pain Faces Pain Scale: Hurts even more Pain Location: left knee with mobility Pain Descriptors / Indicators: Sore Pain Intervention(s): Monitored during session;Repositioned     Home Living                      Prior Function            PT Goals (current goals can now be found in the care plan section) Acute Rehab PT Goals Patient Stated Goal: to rehab then home PT Goal Formulation: With patient/family Time For Goal Achievement: 08/30/15 Potential to Achieve Goals: Good Progress towards PT goals: Progressing toward goals    Frequency  Min 5X/week    PT Plan Current plan remains appropriate    Co-evaluation             End of Session Equipment Utilized During Treatment: Gait belt Activity Tolerance: Patient tolerated treatment well Patient left: in chair;with family/visitor present;with call bell/phone within reach;with chair alarm set     Time: 1010-1026 PT Time Calculation (min) (ACUTE ONLY): 16 min  Charges:  $Therapeutic Activity: 8-22 mins                    G Codes:      Ilda Foil 08/19/2015, 12:06 PM

## 2015-08-19 NOTE — Progress Notes (Signed)
ANTIBIOTIC CONSULT NOTE - FOLLOW-UP  Pharmacy Consult for Vancomycin Indication:  Wound dehiscence/Osteomyelitis  No Known Allergies  Patient Measurements: Height: _0  (177.8 cm) Weight: 255 lb (115.667 kg) IBW/kg (Calculated) : 68.5  Vital Signs: Temp: 98 F (36.7 C) (09/13 1300) Temp Source: Oral (09/13 1300) BP: 132/68 mmHg (09/13 1300) Pulse Rate: 68 (09/13 1300) Intake/Output from previous day: 09/12 0701 - 09/13 0700 In: 620 [P.O.:120; I.V.:500] Out: 45 [Drains:25; Blood:20] Intake/Output from this shift: Total I/O In: 480 [P.O.:480] Out: -   Labs:  Recent Labs  08/19/15 1050  CREATININE 0.70   Estimated Creatinine Clearance: 87.7 mL/min (by C-G formula based on Cr of 0.7).  Recent Labs  08/17/15 1030 08/19/15 0925  VANCOTROUGH 13 15     Microbiology: Recent Results (from the past 720 hour(s))  Wound culture     Status: None   Collection Time: 08/13/15 11:00 PM  Result Value Ref Range Status   Specimen Description WOUND KNEE  Final   Special Requests NONE  Final   Gram Stain   Final    NO WBC SEEN NO SQUAMOUS EPITHELIAL CELLS SEEN FEW GRAM POSITIVE COCCI IN PAIRS Performed at [facility]    Culture   Final    MODERATE METHICILLIN RESISTANT STAPHYLOCOCCUS AUREUS Note: RIFAMPIN AND GENTAMICIN SHOULD NOT BE USED AS SINGLE DRUGS FOR TREATMENT OF STAPH INFECTIONS. This organism DOES NOT demonstrate inducible Clindamycin resistance in vitro. CRITICAL RESULT CALLED TO, READ BACK BY AND VERIFIED WITH: CONSTANCE F 9/10  _1  BY REAMM Performed at [facility]    Report Status 08/16/2015 FINAL  Final   Organism ID, Bacteria METHICILLIN RESISTANT STAPHYLOCOCCUS AUREUS  Final      Susceptibility   Methicillin resistant staphylococcus aureus - MIC*    CLINDAMYCIN <=0.25 SENSITIVE Sensitive     ERYTHROMYCIN >=8 RESISTANT Resistant     GENTAMICIN <=0.5 SENSITIVE Sensitive     LEVOFLOXACIN >=8 RESISTANT Resistant     OXACILLIN >=4  RESISTANT Resistant     RIFAMPIN <=0.5 SENSITIVE Sensitive     TRIMETH/SULFA >=320 RESISTANT Resistant     VANCOMYCIN 1 SENSITIVE Sensitive     TETRACYCLINE <=1 SENSITIVE Sensitive     * MODERATE METHICILLIN RESISTANT STAPHYLOCOCCUS AUREUS  Culture, routine-abscess     Status: None   Collection Time: 08/15/15  6:32 PM  Result Value Ref Range Status   Specimen Description ABSCESS LEFT KNEE A  Final   Special Requests NONE  Final   Gram Stain   Final    NO WBC SEEN NO SQUAMOUS EPITHELIAL CELLS SEEN NO ORGANISMS SEEN Performed at [facility]    Culture   Final    MODERATE METHICILLIN RESISTANT STAPHYLOCOCCUS AUREUS Note: RIFAMPIN AND GENTAMICIN SHOULD NOT BE USED AS SINGLE DRUGS FOR TREATMENT OF STAPH INFECTIONS. This organism DOES NOT demonstrate inducible Clindamycin resistance in vitro. CRITICAL RESULT CALLED TO, READ BACK BY AND VERIFIED WITH: HELENA STREET @  9:25 AM 08/18/15 BY DWEEKS Performed at [facility]    Report Status 08/18/2015 FINAL  Final   Organism ID, Bacteria METHICILLIN RESISTANT STAPHYLOCOCCUS AUREUS  Final      Susceptibility   Methicillin resistant staphylococcus aureus - MIC*    CLINDAMYCIN <=0.25 SENSITIVE Sensitive     ERYTHROMYCIN >=8 RESISTANT Resistant     GENTAMICIN <=0.5 SENSITIVE Sensitive     LEVOFLOXACIN >=8 RESISTANT Resistant     OXACILLIN >=4 RESISTANT Resistant     RIFAMPIN <=0.5 SENSITIVE Sensitive  TRIMETH/SULFA >=320 RESISTANT Resistant     VANCOMYCIN 1 SENSITIVE Sensitive     TETRACYCLINE 2 SENSITIVE Sensitive     * MODERATE METHICILLIN RESISTANT STAPHYLOCOCCUS AUREUS  Culture, routine-abscess     Status: None   Collection Time: 08/15/15  6:32 PM  Result Value Ref Range Status   Specimen Description ABSCESS LEFT THIGH B  Final   Special Requests NONE  Final   Gram Stain   Final    NO WBC SEEN NO SQUAMOUS EPITHELIAL CELLS SEEN NO ORGANISMS SEEN Performed at [facility]    Culture   Final    RARE  METHICILLIN RESISTANT STAPHYLOCOCCUS AUREUS Note: RIFAMPIN AND GENTAMICIN SHOULD NOT BE USED AS SINGLE DRUGS FOR TREATMENT OF STAPH INFECTIONS. This organism DOES NOT demonstrate inducible Clindamycin resistance in vitro. CRITICAL RESULT CALLED TO, READ BACK BY AND VERIFIED WITH: HELENA STREET @  9:25 AM 08/18/15 BY DWEEKS Performed at [facility]    Report Status 08/18/2015 FINAL  Final   Organism ID, Bacteria METHICILLIN RESISTANT STAPHYLOCOCCUS AUREUS  Final      Susceptibility   Methicillin resistant staphylococcus aureus - MIC*    CLINDAMYCIN <=0.25 SENSITIVE Sensitive     ERYTHROMYCIN >=8 RESISTANT Resistant     GENTAMICIN <=0.5 SENSITIVE Sensitive     LEVOFLOXACIN >=8 RESISTANT Resistant     OXACILLIN >=4 RESISTANT Resistant     RIFAMPIN <=0.5 SENSITIVE Sensitive     TRIMETH/SULFA >=320 RESISTANT Resistant     VANCOMYCIN 1 SENSITIVE Sensitive     TETRACYCLINE 2 SENSITIVE Sensitive     * RARE METHICILLIN RESISTANT STAPHYLOCOCCUS AUREUS    Medical History: Past Medical History  Diagnosis Date  . Diabetes mellitus     takes Januvia,Metformin,and Glipizide daily  . Hyperlipidemia     takes Lovastatin nightly  . Anemia     takes Ferrous Gluconate daily  . Peripheral edema     takes Furosemide daily as needed  . Depression     takes Tofranil daily  . Hypertension     takes Amlodipine,Atenolol,and Benazepril daily  . Arthritis   . Joint pain   . Joint swelling   . Cataracts, bilateral     immature  . Infection of skin of knee     left    Medications:  Prescriptions prior to admission  Medication Sig Dispense Refill Last Dose  . amLODipine (NORVASC) 10 MG tablet TAKE 1 TABLET EVERY DAY 90 tablet 1 08/15/2015 at 800  . aspirin EC 325 MG tablet Take 1 tablet (325 mg total) by mouth daily. 84 tablet 0 08/14/2015 at Unknown time  . atenolol (TENORMIN) 50 MG tablet Take 1 tablet (50 mg total) by mouth daily. 90 tablet 1 08/15/2015 at 800  . benazepril (LOTENSIN) 20 MG  tablet TAKE 1 TABLET EVERY DAY 90 tablet 1 08/14/2015 at Unknown time  . Black Cohosh (REMIFEMIN) 20 MG TABS Take 20 mg by mouth 2 (two) times daily.   08/14/2015 at Unknown time  . Calcium Citrate (CITRACAL PO) Take 2 tablets by mouth daily.    08/14/2015 at Unknown time  . ferrous sulfate 325 (65 FE) MG tablet Take 325 mg by mouth daily with breakfast.   0 08/14/2015 at Unknown time  . furosemide (LASIX) 20 MG tablet Take 1-2 tablets once a day as needed for leg swelling (Patient taking differently: Take 20-40 mg by mouth daily. Take 1 tablet (20 mg) by mouth daily, may take an additional tablet (20 mg) as needed for  feet swelling) 180 tablet 1 08/14/2015 at Unknown time  . glipiZIDE (GLUCOTROL) 10 MG tablet TAKE 1 TABLET TWICE DAILY BEFORE A MEAL 180 tablet 1 08/14/2015 at pm  . imipramine (TOFRANIL) 50 MG tablet Take 1 tablet (50 mg total) by mouth 3 (three) times daily. 270 tablet 1 08/14/2015 at pm  . JANUVIA 25 MG tablet TAKE ONE TABLET BY MOUTH ONCE DAILY (Patient taking differently: TAKE ONE TABLET BY MOUTH ONCE DAILY WITH BREAKFAST) 30 tablet 3 08/14/2015 at Unknown time  . lovastatin (MEVACOR) 20 MG tablet Take 10 mg by mouth at bedtime.   08/14/2015 at Unknown time  . metFORMIN (GLUCOPHAGE) 1000 MG tablet TAKE 1 TABLET TWICE DAILY WITH A MEAL (Patient taking differently: Take 1,000 mg by mouth 2 (two) times daily with a meal. ) 180 tablet 1 08/14/2015 at pm  . oxyCODONE (OXY IR/ROXICODONE) 5 MG immediate release tablet Take 1-3 tablets (5-15 mg total) by mouth every 4 (four) hours as needed. (Patient taking differently: Take 5-15 mg by mouth every 4 (four) hours as needed (pain). ) 90 tablet 0 unknown  . clindamycin (CLEOCIN) 300 MG capsule Take 1 capsule (300 mg total) by mouth 4 (four) times daily. X 7 days 28 capsule 0 not yet taken  . glucose blood (ACCU-CHEK AVIVA) test strip Use to monitor FSBS 1x daily. Dx: E11.9 50 each 6 07/10/2015 at Unknown time  . HYDROcodone-acetaminophen (NORCO/VICODIN) 5-325 MG per  tablet Take 0.5-1 tablets by mouth every 4 (four) hours as needed for moderate pain. (Patient not taking: Reported on 08/15/2015) 20 tablet 0 Not Taking at Unknown time  . lovastatin (MEVACOR) 10 MG tablet Take 1 tablet (10 mg total) by mouth at bedtime. (Patient not taking: Reported on 08/15/2015) 90 tablet 2 Not Taking at Unknown time   Scheduled:  . amLODipine  10 mg Oral Daily  . aspirin EC  325 mg Oral BID  . atenolol  50 mg Oral Daily  . benazepril  20 mg Oral Daily  . Chlorhexidine Gluconate Cloth  6 each Topical Q0600  . furosemide  20 mg Oral Daily  . glipiZIDE  10 mg Oral QAC breakfast  . imipramine  50 mg Oral TID  . insulin aspart  0-20 Units Subcutaneous TID WC  . insulin aspart  0-5 Units Subcutaneous QHS  . linagliptin  5 mg Oral Daily  . mupirocin ointment   Nasal BID  . pravastatin  10 mg Oral q1800  . vancomycin  1,000 mg Intravenous Q12H   Assessment: 74 y.o female with deep infection of left knee joint / surgical wound dehiscence of left knee status post partial patellectomy ~ 2 weeks ago. Wound culture from 08/14/15 growing MRSA. Pharmacy consulted to dose IV vancomycin (starting 9/9).   SCr = 0.7, estimated CrCl ~ 87 ml/min   Vancomycin trough = 15 on current 1gm q12h regimen - at lower end of goal.  Anticipate patient will accumulate some drug over the course of therapy.  Noted plans for PICC placement and 6 weeks IV abx.    Goal of Therapy:  Vancomycin trough level 15-20 mcg/ml  Plan:  Continue Vanc 1000 mg IV q12h Per ID recs:  Weekly Vanc trough, CBC with diff, CMP, CRP, ESR Stop date Sep 26, 2015  Aleynah Rocchio, [place].D., BCPS Clinical Pharmacist Pager [phone] 08/19/2015 2:52 PM

## 2015-08-19 NOTE — H&P (Signed)
H&P update  The surgical history has been reviewed and remains accurate without interval change.  The patient was re-examined and patient's physiologic condition has not changed significantly in the last 30 days. The condition still exists that makes this procedure necessary. The treatment plan remains the same, without new options for care.  No new pharmacological allergies or types of therapy has been initiated that would change the plan or the appropriateness of the plan.  The patient and/or family understand the potential benefits and risks.  N. Michael Zarin Knupp, MD 08/19/2015 7:44 PM   

## 2015-08-19 NOTE — Progress Notes (Signed)
INFECTIOUS DISEASE PROGRESS NOTE  ID: Lindsay Cobb is a 74 y.o. female with  Active Problems:   Wound dehiscence, surgical  Subjective: Some knee pain.   Abtx:  Anti-infectives    Start     Dose/Rate Route Frequency Ordered Stop   08/16/15 1330  vancomycin (VANCOCIN) IVPB 1000 mg/200 mL premix    Comments:  To be given in addition to 1 g given this AM.   1,000 mg 200 mL/hr over 60 Minutes Intravenous  Once 08/16/15 1211 08/16/15 1356   08/15/15 1909  vancomycin (VANCOCIN) powder  Status:  Discontinued       As needed 08/15/15 1909 08/15/15 2004   08/15/15 1000  vancomycin (VANCOCIN) IVPB 1000 mg/200 mL premix     1,000 mg 200 mL/hr over 60 Minutes Intravenous Every 12 hours 08/15/15 2313     08/15/15 0015  vancomycin (VANCOCIN) 1,500 mg in sodium chloride 0.9 % 500 mL IVPB     1,500 mg 250 mL/hr over 120 Minutes Intravenous NOW 08/15/15 2313 08/16/15 0015      Medications:  Scheduled: . amLODipine  10 mg Oral Daily  . aspirin EC  325 mg Oral BID  . atenolol  50 mg Oral Daily  . benazepril  20 mg Oral Daily  . Chlorhexidine Gluconate Cloth  6 each Topical Q0600  . furosemide  20 mg Oral Daily  . glipiZIDE  10 mg Oral QAC breakfast  . imipramine  50 mg Oral TID  . insulin aspart  0-20 Units Subcutaneous TID WC  . insulin aspart  0-5 Units Subcutaneous QHS  . linagliptin  5 mg Oral Daily  . mupirocin ointment   Nasal BID  . pravastatin  10 mg Oral q1800  . vancomycin  1,000 mg Intravenous Q12H    Objective: Vital signs in last 24 hours: Temp:  [97 F (36.1 C)-98.8 F (37.1 C)] 98.1 F (36.7 C) (09/13 0602) Pulse Rate:  [65-85] 66 (09/13 0602) Resp:  [16-18] 16 (09/13 0602) BP: (120-142)/(48-65) 120/48 mmHg (09/13 0602) SpO2:  [97 %-99 %] 98 % (09/13 0602)   General appearance: alert, cooperative and no distress Extremities: RUE PIC. L knee dressed.   Lab Results  Recent Labs  08/19/15 1050  NA 133*  K 4.2  CL 98*  CO2 28  BUN 8  CREATININE  0.70   Liver Panel No results for input(s): PROT, ALBUMIN, AST, ALT, ALKPHOS, BILITOT, BILIDIR, IBILI in the last 72 hours. Sedimentation Rate No results for input(s): ESRSEDRATE in the last 72 hours. C-Reactive Protein No results for input(s): CRP in the last 72 hours.  Microbiology: Recent Results (from the past 240 hour(s))  Wound culture     Status: None   Collection Time: 08/13/15 11:00 PM  Result Value Ref Range Status   Specimen Description WOUND KNEE  Final   Special Requests NONE  Final   Gram Stain   Final    NO WBC SEEN NO SQUAMOUS EPITHELIAL CELLS SEEN FEW GRAM POSITIVE COCCI IN PAIRS Performed at [facility]    Culture   Final    MODERATE METHICILLIN RESISTANT STAPHYLOCOCCUS AUREUS Note: RIFAMPIN AND GENTAMICIN SHOULD NOT BE USED AS SINGLE DRUGS FOR TREATMENT OF STAPH INFECTIONS. This organism DOES NOT demonstrate inducible Clindamycin resistance in vitro. CRITICAL RESULT CALLED TO, READ BACK BY AND VERIFIED WITH: CONSTANCE F 9/10  _0  BY REAMM Performed at [facility]    Report Status 08/16/2015 FINAL  Final   Organism ID, Bacteria  METHICILLIN RESISTANT STAPHYLOCOCCUS AUREUS  Final      Susceptibility   Methicillin resistant staphylococcus aureus - MIC*    CLINDAMYCIN <=0.25 SENSITIVE Sensitive     ERYTHROMYCIN >=8 RESISTANT Resistant     GENTAMICIN <=0.5 SENSITIVE Sensitive     LEVOFLOXACIN >=8 RESISTANT Resistant     OXACILLIN >=4 RESISTANT Resistant     RIFAMPIN <=0.5 SENSITIVE Sensitive     TRIMETH/SULFA >=320 RESISTANT Resistant     VANCOMYCIN 1 SENSITIVE Sensitive     TETRACYCLINE <=1 SENSITIVE Sensitive     * MODERATE METHICILLIN RESISTANT STAPHYLOCOCCUS AUREUS  Culture, routine-abscess     Status: None   Collection Time: 08/15/15  6:32 PM  Result Value Ref Range Status   Specimen Description ABSCESS LEFT KNEE A  Final   Special Requests NONE  Final   Gram Stain   Final    NO WBC SEEN NO SQUAMOUS EPITHELIAL CELLS SEEN NO  ORGANISMS SEEN Performed at [facility]    Culture   Final    MODERATE METHICILLIN RESISTANT STAPHYLOCOCCUS AUREUS Note: RIFAMPIN AND GENTAMICIN SHOULD NOT BE USED AS SINGLE DRUGS FOR TREATMENT OF STAPH INFECTIONS. This organism DOES NOT demonstrate inducible Clindamycin resistance in vitro. CRITICAL RESULT CALLED TO, READ BACK BY AND VERIFIED WITH: HELENA STREET @  9:25 AM 08/18/15 BY DWEEKS Performed at [facility]    Report Status 08/18/2015 FINAL  Final   Organism ID, Bacteria METHICILLIN RESISTANT STAPHYLOCOCCUS AUREUS  Final      Susceptibility   Methicillin resistant staphylococcus aureus - MIC*    CLINDAMYCIN <=0.25 SENSITIVE Sensitive     ERYTHROMYCIN >=8 RESISTANT Resistant     GENTAMICIN <=0.5 SENSITIVE Sensitive     LEVOFLOXACIN >=8 RESISTANT Resistant     OXACILLIN >=4 RESISTANT Resistant     RIFAMPIN <=0.5 SENSITIVE Sensitive     TRIMETH/SULFA >=320 RESISTANT Resistant     VANCOMYCIN 1 SENSITIVE Sensitive     TETRACYCLINE 2 SENSITIVE Sensitive     * MODERATE METHICILLIN RESISTANT STAPHYLOCOCCUS AUREUS  Culture, routine-abscess     Status: None   Collection Time: 08/15/15  6:32 PM  Result Value Ref Range Status   Specimen Description ABSCESS LEFT THIGH B  Final   Special Requests NONE  Final   Gram Stain   Final    NO WBC SEEN NO SQUAMOUS EPITHELIAL CELLS SEEN NO ORGANISMS SEEN Performed at [facility]    Culture   Final    RARE METHICILLIN RESISTANT STAPHYLOCOCCUS AUREUS Note: RIFAMPIN AND GENTAMICIN SHOULD NOT BE USED AS SINGLE DRUGS FOR TREATMENT OF STAPH INFECTIONS. This organism DOES NOT demonstrate inducible Clindamycin resistance in vitro. CRITICAL RESULT CALLED TO, READ BACK BY AND VERIFIED WITH: HELENA STREET @  9:25 AM 08/18/15 BY DWEEKS Performed at [facility]    Report Status 08/18/2015 FINAL  Final   Organism ID, Bacteria METHICILLIN RESISTANT STAPHYLOCOCCUS AUREUS  Final      Susceptibility   Methicillin  resistant staphylococcus aureus - MIC*    CLINDAMYCIN <=0.25 SENSITIVE Sensitive     ERYTHROMYCIN >=8 RESISTANT Resistant     GENTAMICIN <=0.5 SENSITIVE Sensitive     LEVOFLOXACIN >=8 RESISTANT Resistant     OXACILLIN >=4 RESISTANT Resistant     RIFAMPIN <=0.5 SENSITIVE Sensitive     TRIMETH/SULFA >=320 RESISTANT Resistant     VANCOMYCIN 1 SENSITIVE Sensitive     TETRACYCLINE 2 SENSITIVE Sensitive     * RARE METHICILLIN RESISTANT STAPHYLOCOCCUS AUREUS    Studies/Results: No  results found.   Assessment/Plan: Wound Dehiscence Osteomyelitis MRSA Will continue vanco PIC in place Aim for 6 weeks of anbx.   Glad to see in ID clinic for f/u in 6 weeks.  Follow ESR and CRP as outpt markers ( 128 and 20.9 here) Wound care  Debrided 9-12, repeat 9-14  DM2 Aim for better control, have been near 200  A1C (7.3)  Therapeutic Drug monitoring  Cr stable, Vanco level at goal. Appreciate pharm.   Total days of antibiotics: 4 vanco       Diagnosis: Wound dehiscence Osteomyelltis  Culture Result: MRSA  No Known Allergies  Discharge antibiotics: Vancomycin 1g IVPB q12h Duration: 42 days End Date: Ocotber 21, 2016  [facility] Care Per Protocol: Labs weekly while on IV antibiotics: _X_ CBC with differential _X_ CMP _X_ CRP _X_ ESR _X_ Vancomycin trough  Fax weekly labs to [phone]  Clinic Follow Up Appt: Kallyn Demarcus, 6 weeks.      Bobby Rumpf Infectious Diseases (pager) [phone] [email or web address] 08/19/2015, 11:55 AM  LOS: 4 days

## 2015-08-19 NOTE — Progress Notes (Signed)
   Subjective:  Patient reports pain as mild.  Objective:   VITALS:   Filed Vitals:   08/18/15 1737 08/18/15 1740 08/18/15 1745 08/19/15 0602  BP: 133/65   120/48  Pulse:  85  66  Temp:   97 F (36.1 C) 98.1 F (36.7 C)  TempSrc:    Oral  Resp:  16  16  Height:      Weight:      SpO2:  97%  98%    KI in place VAC in place Foot wwp, NVI   Lab Results  Component Value Date   WBC 10.2 08/16/2015   HGB 9.2* 08/16/2015   HCT 27.7* 08/16/2015   MCV 84.2 08/16/2015   PLT 406* 08/16/2015     Assessment/Plan:  1 Day Post-Op   - NPO after midnight - plan for repeat I&D and integra placement tomorrow - will need CM assistance for home vac after surgery tomorrow, please start application process today - [facility] - [facility] - up with PT  Cheral Almas 08/19/2015, 7:47 AM [phone]

## 2015-08-20 ENCOUNTER — Encounter (HOSPITAL_COMMUNITY)
Admission: RE | Disposition: A | Discharge status: Skilled Nursing Facility | Payer: Self-pay | Source: Ambulatory Visit | Attending: Orthopaedic Surgery

## 2015-08-20 ENCOUNTER — Inpatient Hospital Stay (HOSPITAL_COMMUNITY): Payer: Commercial Managed Care - HMO | Admitting: Anesthesiology

## 2015-08-20 ENCOUNTER — Telehealth: Payer: Self-pay | Admitting: *Deleted

## 2015-08-20 ENCOUNTER — Encounter (HOSPITAL_COMMUNITY): Payer: Self-pay | Admitting: Anesthesiology

## 2015-08-20 HISTORY — PX: IRRIGATION AND DEBRIDEMENT KNEE: SHX5185

## 2015-08-20 LAB — GLUCOSE, CAPILLARY
GLUCOSE-CAPILLARY: 193 mg/dL — AB (ref 65–99)
GLUCOSE-CAPILLARY: 172 mg/dL — AB (ref 65–99)
Glucose-Capillary: 167 mg/dL — ABNORMAL HIGH (ref 65–99)
Glucose-Capillary: 185 mg/dL — ABNORMAL HIGH (ref 65–99)
Glucose-Capillary: 160 mg/dL — ABNORMAL HIGH (ref 65–99)

## 2015-08-20 LAB — SURGICAL PCR SCREEN
MRSA, PCR: NEGATIVE
STAPHYLOCOCCUS AUREUS: NEGATIVE

## 2015-08-20 SURGERY — IRRIGATION AND DEBRIDEMENT KNEE
Anesthesia: General | Laterality: Left

## 2015-08-20 MED ORDER — FENTANYL CITRATE (PF) 100 MCG/2ML IJ SOLN: 25.0000 ug | INTRAMUSCULAR | Status: DC | PRN

## 2015-08-20 MED ORDER — ONDANSETRON HCL 4 MG/2ML IJ SOLN
INTRAMUSCULAR | Status: AC
  Filled 2015-08-20: qty 2

## 2015-08-20 MED ORDER — OXYCODONE HCL 5 MG/5ML PO SOLN: 5.0000 mg | Freq: Once | ORAL | Status: DC | PRN

## 2015-08-20 MED ORDER — VANCOMYCIN HCL IN DEXTROSE 1-5 GM/200ML-% IV SOLN
INTRAVENOUS | Status: AC
  Filled 2015-08-20: qty 200

## 2015-08-20 MED ORDER — FENTANYL CITRATE (PF) 250 MCG/5ML IJ SOLN
INTRAMUSCULAR | Status: AC
  Filled 2015-08-20: qty 5

## 2015-08-20 MED ORDER — ACETAMINOPHEN 325 MG PO TABS: 325.0000 mg | ORAL_TABLET | ORAL | Status: DC | PRN

## 2015-08-20 MED ORDER — PROPOFOL 10 MG/ML IV BOLUS
INTRAVENOUS | Status: DC | PRN
  Administered 2015-08-20: 40 mg via INTRAVENOUS
  Administered 2015-08-20: 100 mg via INTRAVENOUS

## 2015-08-20 MED ORDER — OXYCODONE HCL 5 MG PO TABS: 5.0000 mg | ORAL_TABLET | Freq: Once | ORAL | Status: DC | PRN

## 2015-08-20 MED ORDER — FENTANYL CITRATE (PF) 100 MCG/2ML IJ SOLN
INTRAMUSCULAR | Status: DC | PRN
  Administered 2015-08-20 (×2): 50 ug via INTRAVENOUS

## 2015-08-20 MED ORDER — OXYCODONE HCL 5 MG PO TABS
5.0000 mg | ORAL_TABLET | ORAL | Status: DC | PRN
  Administered 2015-08-20 – 2015-08-21 (×5): 5 mg via ORAL
  Filled 2015-08-20 (×5): qty 1

## 2015-08-20 MED ORDER — PROPOFOL 10 MG/ML IV BOLUS
INTRAVENOUS | Status: AC
  Filled 2015-08-20: qty 20

## 2015-08-20 MED ORDER — LACTATED RINGERS IV SOLN
INTRAVENOUS | Status: DC | PRN
  Administered 2015-08-20: 11:00:00 via INTRAVENOUS

## 2015-08-20 MED ORDER — ROCURONIUM BROMIDE 50 MG/5ML IV SOLN
INTRAVENOUS | Status: AC
  Filled 2015-08-20: qty 1

## 2015-08-20 MED ORDER — MIDAZOLAM HCL 2 MG/2ML IJ SOLN
INTRAMUSCULAR | Status: AC
  Filled 2015-08-20: qty 4

## 2015-08-20 MED ORDER — CEFAZOLIN SODIUM-DEXTROSE 2-3 GM-% IV SOLR
INTRAVENOUS | Status: AC
  Filled 2015-08-20: qty 50

## 2015-08-20 MED ORDER — SODIUM CHLORIDE 0.9 % IR SOLN
Status: DC | PRN
  Administered 2015-08-20: 3000 mL
  Administered 2015-08-20: 1000 mL

## 2015-08-20 MED ORDER — ACETAMINOPHEN 160 MG/5ML PO SOLN
325.0000 mg | ORAL | Status: DC | PRN
  Filled 2015-08-20: qty 20.3

## 2015-08-20 SURGICAL SUPPLY — 44 items
BNDG COHESIVE 6X5 TAN STRL LF (GAUZE/BANDAGES/DRESSINGS) ×3 IMPLANT
CANISTER WOUND CARE 500ML ATS (WOUND CARE) ×2 IMPLANT
COVER SURGICAL LIGHT HANDLE (MISCELLANEOUS) ×3 IMPLANT
CUFF TOURNIQUET SINGLE 34IN LL (TOURNIQUET CUFF) IMPLANT
CUFF TOURNIQUET SINGLE 44IN (TOURNIQUET CUFF) ×3 IMPLANT
DRAPE IMP U-DRAPE 54X76 (DRAPES) ×3 IMPLANT
DRAPE ORTHO SPLIT 77X108 STRL (DRAPES) ×3
DRAPE SURG ORHT 6 SPLT 77X108 (DRAPES) ×1 IMPLANT
DRSG MEPITEL 4X7.2 (GAUZE/BANDAGES/DRESSINGS) ×2 IMPLANT
DRSG VAC ATS SM SENSATRAC (GAUZE/BANDAGES/DRESSINGS) ×2 IMPLANT
ELECT CAUTERY BLADE 6.4 (BLADE) ×6 IMPLANT
ELECT REM PT RETURN 9FT ADLT (ELECTROSURGICAL) ×3
ELECTRODE REM PT RTRN 9FT ADLT (ELECTROSURGICAL) ×1 IMPLANT
FACESHIELD WRAPAROUND (MASK) IMPLANT
FACESHIELD WRAPAROUND OR TEAM (MASK) IMPLANT
GLOVE BIOGEL PI IND STRL 6.5 (GLOVE) IMPLANT
GLOVE BIOGEL PI INDICATOR 6.5 (GLOVE) ×2
GLOVE NEODERM STRL 7.5 LF PF (GLOVE) ×4 IMPLANT
GLOVE SURG NEODERM 7.5  LF PF (GLOVE) ×8
GOWN STRL REIN XL XLG (GOWN DISPOSABLE) ×6 IMPLANT
HANDPIECE INTERPULSE COAX TIP (DISPOSABLE) ×3
KIT BASIN OR (CUSTOM PROCEDURE TRAY) ×3 IMPLANT
KIT ROOM TURNOVER OR (KITS) ×3 IMPLANT
MANIFOLD NEPTUNE II (INSTRUMENTS) ×3 IMPLANT
MATRIX WOUND MESHED 2X2 (Tissue) IMPLANT
NS IRRIG 1000ML POUR BTL (IV SOLUTION) ×3 IMPLANT
PACK ORTHO EXTREMITY (CUSTOM PROCEDURE TRAY) ×3 IMPLANT
PACK UNIVERSAL I (CUSTOM PROCEDURE TRAY) ×3 IMPLANT
PAD ARMBOARD 7.5X6 YLW CONV (MISCELLANEOUS) ×6 IMPLANT
SET HNDPC FAN SPRY TIP SCT (DISPOSABLE) ×1 IMPLANT
SPONGE LAP 18X18 X RAY DECT (DISPOSABLE) ×3 IMPLANT
STAPLER VISISTAT 35W (STAPLE) ×2 IMPLANT
STOCKINETTE IMPERVIOUS 9X36 MD (GAUZE/BANDAGES/DRESSINGS) ×3 IMPLANT
SUT ETHILON 2 0 FS 18 (SUTURE) ×3 IMPLANT
SUT MON AB 2-0 CT1 36 (SUTURE) ×3 IMPLANT
TOWEL OR 17X24 6PK STRL BLUE (TOWEL DISPOSABLE) ×3 IMPLANT
TOWEL OR 17X26 10 PK STRL BLUE (TOWEL DISPOSABLE) ×3 IMPLANT
TUBE ANAEROBIC SPECIMEN COL (MISCELLANEOUS) ×3 IMPLANT
TUBE CONNECTING 12'X1/4 (SUCTIONS) ×1
TUBE CONNECTING 12X1/4 (SUCTIONS) ×2 IMPLANT
UNDERPAD 30X30 INCONTINENT (UNDERPADS AND DIAPERS) ×3 IMPLANT
WATER STERILE IRR 1000ML POUR (IV SOLUTION) ×3 IMPLANT
WOUND MATRIX MESHED 2X2 (Tissue) ×2 IMPLANT
YANKAUER SUCT BULB TIP NO VENT (SUCTIONS) ×3 IMPLANT

## 2015-08-20 NOTE — Op Note (Signed)
   Date of Surgery: 08/20/2015  INDICATIONS: Ms. Tayag is a 74 y.o.-year-old female who returns for repeat washout and integra placement;  The patient did consent to the procedure after discussion of the risks and benefits.  PREOPERATIVE DIAGNOSIS: Left knee wound  POSTOPERATIVE DIAGNOSIS: Same.  PROCEDURE:  1. Surgical preparation of left knee wound for integra placement 5 x 5 cm 2. Application of skin substitute graft, integra. 5 x 5 cm  SURGEON: N. Glee Arvin, M.D.  ASSIST: none.  ANESTHESIA:  general  IV FLUIDS AND URINE: See anesthesia.  ESTIMATED BLOOD LOSS: minimal mL.  IMPLANTS: Integra  DRAINS: wound VAC  COMPLICATIONS: None.  DESCRIPTION OF PROCEDURE: The patient was brought to the operating room and placed supine on the operating table.  The patient had been signed prior to the procedure and this was documented. The patient had the anesthesia placed by the anesthesiologist.  A time-out was performed to confirm that this was the correct patient, site, side and location. The patient had an SCD on the opposite lower extremity. The patient did receive antibiotics prior to the incision and was re-dosed during the procedure as needed at indicated intervals.  The patient had the operative extremity prepped and draped in the standard surgical fashion.    Sharp excisional debridement of the tissue bed was performed with a knife. We then thoroughly irrigated this wound with pulse lavage. We then applied a pre-mashed 5 x 5 cm Integra onto the wound. The Integra was fixed down to the wound bed using staples. A wound VAC was placed over the the incision and the wound. The knee was placed back into a knee immobilizer. The patient tolerated the procedure well was x-rayed and transferred to the PACU in stable condition.  POSTOPERATIVE PLAN: Patient will be nonweightbearing to the left lower extremity. She will need to go to the skilled nursing facility with a home VAC for 1-2 weeks and  then she will return in 1-2 weeks for skin graft application.  Mayra Reel, MD [facility] [phone] 11:59 AM

## 2015-08-20 NOTE — Progress Notes (Signed)
OT Cancellation Note  Patient Details Name: Lindsay Cobb MRN: [id number] DOB: 20-Dec-1940   Cancelled Treatment:    Reason Eval/Treat Not Completed:  (Preparing to go for I&D.) Will continue to follow.  Evern Bio 08/20/2015, 9:32 AM  [phone]

## 2015-08-20 NOTE — Anesthesia Postprocedure Evaluation (Signed)
  Anesthesia Post-op Note  Patient: Lindsay Cobb  Procedure(s) Performed: Procedure(s): IRRIGATION AND DEBRIDEMENT KNEE WITH PLACEMENT OF INTEGRA (Left)  Patient Location: PACU  Anesthesia Type: General   Level of Consciousness: awake, alert  and oriented  Airway and Oxygen Therapy: Patient Spontanous Breathing  Post-op Pain: mild  Post-op Assessment: Post-op Vital signs reviewed  Post-op Vital Signs: Reviewed  Last Vitals:  Filed Vitals:   08/20/15 2008  BP: 130/52  Pulse: 64  Temp: 36.7 C  Resp: 18    Complications: No apparent anesthesia complications

## 2015-08-20 NOTE — Progress Notes (Signed)
Report called to short stay - all information provided.

## 2015-08-20 NOTE — Anesthesia Procedure Notes (Signed)
Procedure Name: LMA Insertion Date/Time: 08/20/2015 11:26 AM Performed by: Romie Minus K Pre-anesthesia Checklist: Patient identified, Emergency Drugs available, Suction available, Patient being monitored and Timeout performed Patient Re-evaluated:Patient Re-evaluated prior to inductionOxygen Delivery Method: Circle system utilized Preoxygenation: Pre-oxygenation with 100% oxygen Intubation Type: IV induction Ventilation: Mask ventilation without difficulty LMA: LMA inserted LMA Size: 4.0 Placement Confirmation: positive ETCO2,  CO2 detector and breath sounds checked- equal and bilateral Tube secured with: Tape Dental Injury: Teeth and Oropharynx as per pre-operative assessment

## 2015-08-20 NOTE — Progress Notes (Signed)
PT Cancellation Note  Patient Details Name: Lindsay Cobb MRN: [id number] DOB: [birth year]   Cancelled Treatment:    Reason Eval/Treat Not Completed: Patient at procedure or test/unavailable. Patient preparing to go down for further surgery. Will follow up tomorrow as appropriate   Fredrich Birks 08/20/2015, 9:49 AM

## 2015-08-20 NOTE — Anesthesia Preprocedure Evaluation (Addendum)
Anesthesia Evaluation  Patient identified by MRN, date of birth, ID band Patient awake    Reviewed: Allergy & Precautions, NPO status , Patient's Chart, lab work & pertinent test results  History of Anesthesia Complications Negative for: history of anesthetic complications  Airway Mallampati: II  TM Distance: >3 FB Neck ROM: Full    Dental  (+) Edentulous Upper, Edentulous Lower,    Pulmonary neg pulmonary ROS,    breath sounds clear to auscultation       Cardiovascular hypertension, Pt. on medications and Pt. on home beta blockers (-) angina(-) CHF  Rhythm:Regular     Neuro/Psych PSYCHIATRIC DISORDERS Anxiety Depression negative neurological ROS     GI/Hepatic negative GI ROS, Neg liver ROS,   Endo/Other  diabetesMorbid obesity  Renal/GU      Musculoskeletal  (+) Arthritis ,   Abdominal   Peds  Hematology  (+) anemia ,   Anesthesia Other Findings   Reproductive/Obstetrics                            Anesthesia Physical Anesthesia Plan  ASA: III  Anesthesia Plan: General   Post-op Pain Management:    Induction: Intravenous  Airway Management Planned: LMA  Additional Equipment: None  Intra-op Plan:   Post-operative Plan: Extubation in OR  Informed Consent: I have reviewed the patients History and Physical, chart, labs and discussed the procedure including the risks, benefits and alternatives for the proposed anesthesia with the patient or authorized representative who has indicated his/her understanding and acceptance.   Dental advisory given  Plan Discussed with: CRNA and Surgeon  Anesthesia Plan Comments:         Anesthesia Quick Evaluation

## 2015-08-20 NOTE — Telephone Encounter (Signed)
Received fax from [facility] care management stating that this patient has been admitted to acute care hospital  Hospital: [facility]   Admit Date: 08/15/15  Dx:T81.4XXD-infection following a procedure,subsequent encounter  Admitting physician: Cheral Almas MD  Pending authorization: [id number]

## 2015-08-20 NOTE — Transfer of Care (Signed)
Immediate Anesthesia Transfer of Care Note  Patient: Lindsay Cobb  Procedure(s) Performed: Procedure(s): IRRIGATION AND DEBRIDEMENT KNEE WITH PLACEMENT OF INTEGRA (Left)  Patient Location: PACU  Anesthesia Type:General  Level of Consciousness: awake, alert  and oriented  Airway & Oxygen Therapy: Patient Spontanous Breathing and Patient connected to nasal cannula oxygen  Post-op Assessment: Report given to RN and Post -op Vital signs reviewed and stable  Post vital signs: Reviewed and stable  Last Vitals:  Filed Vitals:   08/19/15 2110  BP: 121/55  Pulse: 68  Temp: 37.1 C  Resp: 16    Complications: No apparent anesthesia complications

## 2015-08-20 NOTE — Progress Notes (Signed)
Orthopedic Tech Progress Note Patient Details:  Lindsay Cobb [birth year] [id number]  Patient ID: Lindsay Cobb, female   DOB: 05-Nov-1941, 74 y.o.   MRN: [id number] Called in bio-tech brace order; spoke with Anderson Malta, Duwayne Matters 08/20/2015, 2:14 PM

## 2015-08-21 ENCOUNTER — Encounter (HOSPITAL_COMMUNITY): Payer: Self-pay | Admitting: Orthopaedic Surgery

## 2015-08-21 LAB — GLUCOSE, CAPILLARY
GLUCOSE-CAPILLARY: 164 mg/dL — AB (ref 65–99)
GLUCOSE-CAPILLARY: 209 mg/dL — AB (ref 65–99)
Glucose-Capillary: 126 mg/dL — ABNORMAL HIGH (ref 65–99)

## 2015-08-21 MED ORDER — HEPARIN SOD (PORK) LOCK FLUSH 100 UNIT/ML IV SOLN
250.0000 [IU] | INTRAVENOUS | Status: AC | PRN
  Administered 2015-08-21: 250 [IU]

## 2015-08-21 MED ORDER — VANCOMYCIN HCL IN DEXTROSE 1-5 GM/200ML-% IV SOLN: 1000.0000 mg | Freq: Two times a day (BID) | INTRAVENOUS | Status: DC

## 2015-08-21 MED ORDER — OXYCODONE HCL 5 MG PO TABS: 5.0000 mg | ORAL_TABLET | ORAL | Status: DC | PRN

## 2015-08-21 NOTE — Clinical Social Work Placement (Signed)
   CLINICAL SOCIAL WORK PLACEMENT  NOTE  Date:  08/21/2015  Patient Details  Name: Lindsay Cobb MRN: [id number] Date of Birth: September 06, 1941  Clinical Social Work is seeking post-discharge placement for this patient at the Skilled  Nursing Facility level of care (*CSW will initial, date and re-position this form in  chart as items are completed):  Yes   Patient/family provided with [facility] Clinical Social Work Department's list of facilities offering this level of care within the geographic area requested by the patient (or if unable, by the patient's family).  Yes   Patient/family informed of their freedom to choose among providers that offer the needed level of care, that participate in Medicare, Medicaid or managed care program needed by the patient, have an available bed and are willing to accept the patient.  Yes   Patient/family informed of [facility]'s ownership interest in [facility] and [facility], as well as of the fact that they are under no obligation to receive care at these facilities.  PASRR submitted to EDS on       PASRR number received on       Existing PASRR number confirmed on 08/17/15     FL2 transmitted to all facilities in geographic area requested by pt/family on 08/17/15     FL2 transmitted to all facilities within larger geographic area on       Patient informed that his/her managed care company has contracts with or will negotiate with certain facilities, including the following:        Yes   Patient/family informed of bed offers received.  Patient chooses bed at [facility]     Physician recommends and patient chooses bed at      Patient to be transferred to [facility] on 08/21/15.  Patient to be transferred to facility by [facility]     Patient family notified on 08/21/15 of transfer.  Name of family member notified:  Patient and patient's husband at bedside.     PHYSICIAN       Additional Comment:     _______________________________________________ Rod Mae, LCSW 08/21/2015, 4:18 PM

## 2015-08-21 NOTE — Progress Notes (Signed)
Occupational Therapy Treatment Patient Details Name: Lindsay Cobb MRN: [id number] DOB: [birth year] Today's Date: 08/21/2015    History of present illness 74 y.o. female with L patellar fx sustained in a fall. She underwent partial patellectomy 07-11-15.  Returning for infection of L knee and thigh, with irrigation and debridement to excise necrotic tissue with L knee NWB and in immobilizer with no ROM.  Wound vac in place L thigh.      OT comments  Pt with very slow progress with adls.  Pt very nervous and anxious about getting onto her feet. Pt requires much encouragement to move forward with functional mobility due to recent falls.  Will continue to encourage pt and follow in acute care for functional mobility and LE adls until she leaves for SNF.  Follow Up Recommendations  SNF    Equipment Recommendations  Other (comment) (TBD at next venue.)    Recommendations for Other Services      Precautions / Restrictions Precautions Precautions: Fall Precaution Comments: L knee in immobilizer Required Braces or Orthoses: Other Brace/Splint Knee Immobilizer - Left: On at all times Other Brace/Splint: Locked bledsoe brace-LLE Restrictions Weight Bearing Restrictions: Yes LLE Weight Bearing: Non weight bearing       Mobility Bed Mobility         Supine to sit: Mod assist;HOB elevated     General bed mobility comments: Pt in chair on arrival.  Transfers Overall transfer level: Needs assistance Equipment used: Rolling walker (2 wheeled) Transfers: Sit to/from Stand Sit to Stand: Mod assist;+2 physical assistance Stand pivot transfers: Mod assist;+2 physical assistance       General transfer comment: Continuous verbal cueing for sequencing, encouragement and safety. Pt very anxious during transfer today. She transfered bed to [facility] to recliner.    Balance Overall balance assessment: Needs assistance Sitting-balance support: Feet supported Sitting balance-Leahy Scale: Fair      Standing balance support: Bilateral upper extremity supported;During functional activity Standing balance-Leahy Scale: Zero Standing balance comment: Pt needed alot of outside assist to stand and remain standing. Pt needs walker as well as +1 assist to remain in standing.                   ADL Overall ADL's : Needs assistance/impaired Eating/Feeding: Independent;Sitting   Grooming: Set up;Sitting       Lower Body Bathing: Maximal assistance;Sit to/from stand Lower Body Bathing Details (indicate cue type and reason): Pt only able to reach just below her knees when bathing in sitting.  Pt very anxious standing today requiring +2 assist more for confidence than anything else.     Lower Body Dressing: Total assistance;Sit to/from stand Lower Body Dressing Details (indicate cue type and reason): Pt continues to require +2 to transfer into standing. Pt resists sit to stand and asked repeatedly not to stand bc she is afraid of falling. Once pt up she stood with min assist for 30 seconds despite requesting several times to return to sitting. Toilet Transfer: +2 for physical assistance;Moderate assistance;Stand-pivot;BSC Toilet Transfer Details (indicate cue type and reason): anxiety limiting pts progress. Toileting- Clothing Manipulation and Hygiene: Total assistance       Functional mobility during ADLs: +2 for physical assistance;Moderate assistance;Rolling walker;Cueing for sequencing;Cueing for safety General ADL Comments: Pt self limiting due to anxiety re: getting up and falling.  Spoke to pt at length about the need to get up and move and stand during adls.      Vision  Perception     Praxis      Cognition   Behavior During Therapy: WFL for tasks assessed/performed Overall Cognitive Status: Within Functional Limits for tasks assessed       Memory: Decreased recall of precautions               Extremity/Trunk Assessment                Exercises     Shoulder Instructions       General Comments      Pertinent Vitals/ Pain       Pain Assessment: Faces Faces Pain Scale: Hurts even more Pain Location: LLE with mobility Pain Descriptors / Indicators: Operative site guarding Pain Intervention(s): Monitored during session;Repositioned;Premedicated before session  Home Living                                          Prior Functioning/Environment              Frequency Min 2X/week     Progress Toward Goals  OT Goals(current goals can now be found in the care plan section)  Progress towards OT goals: Progressing toward goals  Acute Rehab OT Goals Patient Stated Goal: to rehab then home OT Goal Formulation: With patient Time For Goal Achievement: 08/24/15 Potential to Achieve Goals: Good ADL Goals Pt Will Transfer to Toilet: with min assist;stand pivot transfer;bedside commode Additional ADL Goal #1: Pt will be min A in and OOB for BADLs (only needing A for LLE)  Plan Discharge plan remains appropriate    Co-evaluation                 End of Session Equipment Utilized During Treatment: Gait belt;Rolling walker   Activity Tolerance Patient limited by fatigue   Patient Left in chair;with call bell/phone within reach   Nurse Communication Mobility status        Time: 0981-1914 OT Time Calculation (min): 25 min  Charges: OT General Charges $OT Visit: 1 Procedure OT Treatments $Self Care/Home Management : 23-37 mins  Hope Budds 08/21/2015, 2:08 PM  [phone]

## 2015-08-21 NOTE — Care Management Important Message (Signed)
Important Message  Patient Details  Name: CHAUNA OSORIA MRN: [id number] Date of Birth: Nov 12, 1941   Medicare Important Message Given:  Yes-second notification given    Monica Becton, RN 08/21/2015, 12:45 PM

## 2015-08-21 NOTE — Progress Notes (Signed)
Prepared for d/c by Tech. PICC line - care completed by IV team and IV running fluids d/c'ed. Husband has left with personal belongings, will meet the patient at the [facility] [place]. Wound vac disconnected. Pain medication administered for pain management drive to SNF. [facility] has arrived, 2 attendents will transport to SNF.

## 2015-08-21 NOTE — Discharge Summary (Addendum)
Physician Discharge Summary      Patient ID: Lindsay Cobb MRN: [id number] DOB/AGE: [birth year] 73 y.o.  Admit date: 08/15/2015 Discharge date: 08/21/2015  Admission Diagnoses:  <principal problem not specified>  Discharge Diagnoses:  Active Problems:   Wound dehiscence, surgical   Past Medical History  Diagnosis Date  . Diabetes mellitus     takes Januvia,Metformin,and Glipizide daily  . Hyperlipidemia     takes Lovastatin nightly  . Anemia     takes Ferrous Gluconate daily  . Peripheral edema     takes Furosemide daily as needed  . Depression     takes Tofranil daily  . Hypertension     takes Amlodipine,Atenolol,and Benazepril daily  . Arthritis   . Joint pain   . Joint swelling   . Cataracts, bilateral     immature  . Infection of skin of knee     left    Surgeries: Procedure(s): IRRIGATION AND DEBRIDEMENT KNEE WITH PLACEMENT OF INTEGRA on 08/15/2015 - 08/20/2015   Consultants (if any):    Discharged Condition: Improved  Hospital Course: Lindsay Cobb is an 74 y.o. female who was admitted 08/15/2015 with a diagnosis of <principal problem not specified> and went to the operating room on 08/15/2015 - 08/20/2015 and underwent the above named procedures.    She was given perioperative antibiotics:  Anti-infectives    Start     Dose/Rate Route Frequency Ordered Stop   08/21/15 0000  vancomycin (VANCOCIN) 1 GM/200ML SOLN     1,000 mg 200 mL/hr over 60 Minutes Intravenous Every 12 hours 08/21/15 0655 09/26/15 2359   08/20/15 1053  vancomycin (VANCOCIN) 1 GM/200ML IVPB    Comments:  Romie Minus   : cabinet override      08/20/15 1053 08/20/15 2259   08/16/15 1330  vancomycin (VANCOCIN) IVPB 1000 mg/200 mL premix    Comments:  To be given in addition to 1 g given this AM.   1,000 mg 200 mL/hr over 60 Minutes Intravenous  Once 08/16/15 1211 08/16/15 1356   08/15/15 1909  vancomycin (VANCOCIN) powder  Status:  Discontinued       As needed 08/15/15 1909 08/15/15  2004   08/15/15 1000  vancomycin (VANCOCIN) IVPB 1000 mg/200 mL premix     1,000 mg 200 mL/hr over 60 Minutes Intravenous Every 12 hours 08/15/15 2313     08/15/15 0015  vancomycin (VANCOCIN) 1,500 mg in sodium chloride 0.9 % 500 mL IVPB     1,500 mg 250 mL/hr over 120 Minutes Intravenous NOW 08/15/15 2313 08/16/15 0015    .  She was given sequential compression devices, early ambulation, and aspirin for DVT prophylaxis.  She benefited maximally from the hospital stay and there were no complications.    Recent vital signs:  Filed Vitals:   08/21/15 0415  BP: 128/50  Pulse: 66  Temp: 98.5 F (36.9 C)  Resp: 18    Recent laboratory studies:  Lab Results  Component Value Date   HGB 9.2* 08/16/2015   HGB 9.5* 08/14/2015   HGB 12.6 07/10/2015   Lab Results  Component Value Date   WBC 10.2 08/16/2015   PLT 406* 08/16/2015   No results found for: INR Lab Results  Component Value Date   NA 133* 08/19/2015   K 4.2 08/19/2015   CL 98* 08/19/2015   CO2 28 08/19/2015   BUN 8 08/19/2015   CREATININE 0.70 08/19/2015   GLUCOSE 167* 08/19/2015    Discharge Medications:  Medication List    TAKE these medications        amLODipine 10 MG tablet  Commonly known as:  NORVASC  TAKE 1 TABLET EVERY DAY     aspirin EC 325 MG tablet  Take 1 tablet (325 mg total) by mouth daily.     atenolol 50 MG tablet  Commonly known as:  TENORMIN  Take 1 tablet (50 mg total) by mouth daily.     benazepril 20 MG tablet  Commonly known as:  LOTENSIN  TAKE 1 TABLET EVERY DAY     CITRACAL PO  Take 2 tablets by mouth daily.     clindamycin 300 MG capsule  Commonly known as:  CLEOCIN  Take 1 capsule (300 mg total) by mouth 4 (four) times daily. X 7 days     ferrous sulfate 325 (65 FE) MG tablet  Take 325 mg by mouth daily with breakfast.     furosemide 20 MG tablet  Commonly known as:  LASIX  Take 1-2 tablets once a day as needed for leg swelling     glipiZIDE 10 MG tablet    Commonly known as:  GLUCOTROL  TAKE 1 TABLET TWICE DAILY BEFORE A MEAL     glucose blood test strip  Commonly known as:  ACCU-CHEK AVIVA  Use to monitor FSBS 1x daily. Dx: E11.9     HYDROcodone-acetaminophen 5-325 MG per tablet  Commonly known as:  NORCO/VICODIN  Take 0.5-1 tablets by mouth every 4 (four) hours as needed for moderate pain.     imipramine 50 MG tablet  Commonly known as:  TOFRANIL  Take 1 tablet (50 mg total) by mouth 3 (three) times daily.     JANUVIA 25 MG tablet  Generic drug:  sitaGLIPtin  TAKE ONE TABLET BY MOUTH ONCE DAILY     lovastatin 20 MG tablet  Commonly known as:  MEVACOR  Take 10 mg by mouth at bedtime.     lovastatin 10 MG tablet  Commonly known as:  MEVACOR  Take 1 tablet (10 mg total) by mouth at bedtime.     metFORMIN 1000 MG tablet  Commonly known as:  GLUCOPHAGE  TAKE 1 TABLET TWICE DAILY WITH A MEAL     oxyCODONE 5 MG immediate release tablet  Commonly known as:  Oxy IR/ROXICODONE  Take 1-3 tablets (5-15 mg total) by mouth every 4 (four) hours as needed.     oxyCODONE 5 MG immediate release tablet  Commonly known as:  Oxy IR/ROXICODONE  Take 1-3 tablets (5-15 mg total) by mouth every 4 (four) hours as needed.     REMIFEMIN 20 MG Tabs  Generic drug:  Black Cohosh  Take 20 mg by mouth 2 (two) times daily.     vancomycin 1 GM/200ML Soln  Commonly known as:  VANCOCIN  Inject 200 mLs (1,000 mg total) into the vein every 12 (twelve) hours.        Diagnostic Studies: No results found.  Disposition: She needs to keep the [facility] on for 1 week at -75 mmHg.  Do not change VAC. MD to change.  Vancomycin 1000 mg q12h x 6 weeks      Discharge Instructions    Call MD / Call 911    Complete by:  As directed   If you experience chest pain or shortness of breath, CALL 911 and be transported to the hospital emergency room.  If you develope a fever above 101.5 F, pus (white drainage) or increased drainage or redness  at the wound, or calf pain,  call your surgeon's office.     Constipation Prevention    Complete by:  As directed   Drink plenty of fluids.  Prune juice may be helpful.  You may use a stool softener, such as Colace (over the counter) 100 mg twice a day.  Use MiraLax (over the counter) for constipation as needed.     Diet - low sodium heart healthy    Complete by:  As directed      Diet general    Complete by:  As directed      Driving restrictions    Complete by:  As directed   No driving while taking narcotic pain meds.     Increase activity slowly as tolerated    Complete by:  As directed      Non weight bearing    Complete by:  As directed            Follow-up Information    Follow up with Cheral Almas, MD In 1 week.   Specialty:  Orthopedic Surgery   Why:  For wound re-check   Contact information:   [address] [place] [address] [phone]        Signed: Cheral Almas 08/21/2015, 12:09 PM

## 2015-08-21 NOTE — Progress Notes (Signed)
Physical Therapy Treatment Patient Details Name: Lindsay Cobb MRN: [id number] DOB: 20-Apr-1941 Today's Date: 08/21/2015    History of Present Illness 74 y.o. female with L patellar fx sustained in a fall. She underwent partial patellectomy 07-11-15.  Returning for infection of L knee and thigh, with irrigation and debridement to excise necrotic tissue with L knee NWB and in immobilizer with no ROM.  Wound vac in place L thigh.       PT Comments    Plan is for d/c to the [facility] today.  Follow Up Recommendations  SNF;Supervision/Assistance - 24 hour     Equipment Recommendations  None recommended by PT    Recommendations for Other Services       Precautions / Restrictions Precautions Precautions: Fall Required Braces or Orthoses: Other Brace/Splint Knee Immobilizer - Left: On at all times Other Brace/Splint: Locked bledsoe brace-LLE Restrictions Weight Bearing Restrictions: Yes LLE Weight Bearing: Non weight bearing    Mobility  Bed Mobility         Supine to sit: Mod assist;HOB elevated     General bed mobility comments: verbal cues for sequencing  Transfers   Equipment used: Rolling walker (2 wheeled)   Sit to Stand: Mod assist;+2 physical assistance Stand pivot transfers: Mod assist;+2 physical assistance       General transfer comment: Continuous verbal cueing for sequencing, encouragement and safety. Pt very anxious during transfer today. She transfered bed to [facility] to recliner.  Ambulation/Gait                 Stairs            Wheelchair Mobility    Modified Rankin (Stroke Patients Only)       Balance                                    Cognition Arousal/Alertness: Awake/alert Behavior During Therapy: WFL for tasks assessed/performed Overall Cognitive Status: Within Functional Limits for tasks assessed       Memory: Decreased recall of precautions              Exercises      General Comments         Pertinent Vitals/Pain Pain Assessment: Faces Faces Pain Scale: Hurts even more Pain Location: LLE with mobility Pain Descriptors / Indicators: Sore Pain Intervention(s): Monitored during session;Repositioned    Home Living                      Prior Function            PT Goals (current goals can now be found in the care plan section) Acute Rehab PT Goals Patient Stated Goal: to rehab then home PT Goal Formulation: With patient/family Time For Goal Achievement: 08/30/15 Potential to Achieve Goals: Good Progress towards PT goals: Progressing toward goals    Frequency  Min 5X/week    PT Plan Current plan remains appropriate    Co-evaluation             End of Session Equipment Utilized During Treatment: Gait belt;Other (comment) (locked bledsoe brace LLE) Activity Tolerance: Patient tolerated treatment well Patient left: in chair;with family/visitor present;with chair alarm set;with call bell/phone within reach     Time: 1046-1110 PT Time Calculation (min) (ACUTE ONLY): 24 min  Charges:  $Therapeutic Activity: 23-37 mins  G Codes:      Ilda Foil 08/21/2015, 11:49 AM

## 2015-08-21 NOTE — Progress Notes (Signed)
Patient is stable Patient needs home vac for 1-2 weeks, appreciate CM assistance.  Wound dimensions are 5 x 5 x 0.5 cm without tunneling or undermining Locked bledsoe in place May return to [facility] once home vac is set up abx Rx in chart F/u 1 week in office  N. Glee Arvin, MD [facility] [phone] 8:18 AM

## 2015-08-21 NOTE — Progress Notes (Signed)
Have attempted to call [facility] [place] @ [phone] x 5 since 6:45 PM - no one answers and rolls over to a disconnected tone. This phone # is in the chart under SW note of 4:15 PM. Confirmed # via [employer] / web site.

## 2015-08-21 NOTE — Progress Notes (Signed)
Called [facility] [place] @ [phone] three additional times [x3] - ringing tone not answered. This RN unable to give report.

## 2015-08-21 NOTE — Discharge Planning (Signed)
Patient to be discharged to [facility]. Patient and patient's husband updated at bedside.  Facility: [facility] RN report number: [phone] Transportation: Frances [facility]wood [phone] Clinical Social Work Department Orthopedics [phone]) and Surgical [phone])

## 2015-08-22 NOTE — Anesthesia Postprocedure Evaluation (Signed)
  Anesthesia Post-op Note  Patient: Lindsay Cobb  Procedure(s) Performed: Procedure(s): IRRIGATION AND DEBRIDEMENT EXTREMITY (Left) MINOR APPLICATION OF WOUND VAC  Patient Location: PACU  Anesthesia Type:General  Level of Consciousness: awake and alert   Airway and Oxygen Therapy: Patient Spontanous Breathing  Post-op Pain: mild  Post-op Assessment: Post-op Vital signs reviewed LLE Motor Response: Purposeful movement LLE Sensation: Full sensation          Post-op Vital Signs: stable  Last Vitals:  Filed Vitals:   08/21/15 1300  BP: 135/66  Pulse: 74  Temp: 36.5 C  Resp: 18    Complications: No apparent anesthesia complications

## 2015-08-25 ENCOUNTER — Telehealth: Payer: Self-pay | Admitting: *Deleted

## 2015-08-25 ENCOUNTER — Ambulatory Visit: Payer: Medicare Other | Admitting: Family Medicine

## 2015-08-25 NOTE — Telephone Encounter (Signed)
Received fax from [facility] Discharge Dispostion from Hospital  Date of admit:08/15/15  Date of discharge: 08/21/15  Level of Care: Inpatient Acute Medical Care  Discharging facility: [facility]   Attending physician: Ignatius Specking  PCP: Kingsley Spittle [place],MD  Discharge Dispostion: Discharged/transferred to Skilled Nursing facility with Medicare Certification  DX: T81.4XXd-infection following a procedure,subsequent encounter

## 2015-08-28 ENCOUNTER — Other Ambulatory Visit (HOSPITAL_COMMUNITY): Payer: Self-pay | Admitting: Orthopaedic Surgery

## 2015-09-01 ENCOUNTER — Encounter: Payer: Commercial Managed Care - HMO | Admitting: Family Medicine

## 2015-09-02 MED ORDER — DEXTROSE 5 % IV SOLN
3.0000 g | INTRAVENOUS | Status: DC
  Filled 2015-09-02: qty 3000

## 2015-09-02 NOTE — Progress Notes (Signed)
Several unsuccessful attempts were made to contact pt; unable to leave pre-op instructions because pt answering service does not function properly. The phone rings many times and then when greeting plays it states that " no one is available to take your call, please call again, beeps, and then disconnects."

## 2015-09-03 ENCOUNTER — Encounter (HOSPITAL_COMMUNITY): Admission: RE | Payer: Self-pay | Source: Ambulatory Visit

## 2015-09-03 ENCOUNTER — Encounter (HOSPITAL_COMMUNITY): Payer: Self-pay | Admitting: Certified Registered Nurse Anesthetist

## 2015-09-03 ENCOUNTER — Ambulatory Visit (HOSPITAL_COMMUNITY)
Admission: RE | Admit: 2015-09-03 | Payer: Commercial Managed Care - HMO | Source: Ambulatory Visit | Admitting: Orthopaedic Surgery

## 2015-09-03 SURGERY — APPLICATION, GRAFT, SKIN, SPLIT-THICKNESS
Anesthesia: General | Laterality: Left

## 2015-09-03 NOTE — H&P (Signed)
PREOPERATIVE H&P  Chief Complaint: Left knee wound  HPI: Lindsay Cobb is a 74 y.o. female who presents for surgical treatment of Left knee wound.  She denies any changes in medical history.  Past Medical History  Diagnosis Date  . Diabetes mellitus     takes Januvia,Metformin,and Glipizide daily  . Hyperlipidemia     takes Lovastatin nightly  . Anemia     takes Ferrous Gluconate daily  . Peripheral edema     takes Furosemide daily as needed  . Depression     takes Tofranil daily  . Hypertension     takes Amlodipine,Atenolol,and Benazepril daily  . Arthritis   . Joint pain   . Joint swelling   . Cataracts, bilateral     immature  . Infection of skin of knee     left   Past Surgical History  Procedure Laterality Date  . Breast biopsy Left   . Colonoscopy    . Esophagogastroduodenoscopy    . Ercp    . Patellectomy Left 07/11/2015    Procedure: Left Partial PATELLECTOMY;  Surgeon: Tarry Kos, MD;  [facility];  Service: Orthopedics;  Laterality: Left;  . Irrigation and debridement knee Left 08/15/2015    Procedure: IRRIGATION AND DEBRIDEMENT LEFT KNEE;  Surgeon: Tarry Kos, MD;  [facility];  Service: Orthopedics;  Laterality: Left;  . Patellectomy Left 08/15/2015    Procedure: Revision PATELLECTOMY;  Surgeon: Tarry Kos, MD;  [facility];  Service: Orthopedics;  Laterality: Left;  . I&d extremity Left 08/18/2015    Procedure: IRRIGATION AND DEBRIDEMENT EXTREMITY;  Surgeon: Tarry Kos, MD;  [facility];  Service: Orthopedics;  Laterality: Left;  Marland Kitchen Minor application of wound vac  08/18/2015    Procedure: MINOR APPLICATION OF WOUND VAC;  Surgeon: Tarry Kos, MD;  [facility];  Service: Orthopedics;;  . Irrigation and debridement knee Left 08/20/2015    Procedure: IRRIGATION AND DEBRIDEMENT KNEE WITH PLACEMENT OF INTEGRA;  Surgeon: Tarry Kos, MD;  [facility];  Service: Orthopedics;  Laterality: Left;   Social History   Social History    . Marital Status: Married    Spouse Name: N/A  . Number of Children: N/A  . Years of Education: N/A   Social History Main Topics  . Smoking status: Never Smoker   . Smokeless tobacco: Never Used  . Alcohol Use: No  . Drug Use: No  . Sexual Activity: Not on file   Other Topics Concern  . Not on file   Social History Narrative   Family History  Problem Relation Age of Onset  . Hypertension Mother   . Diabetes Mother   . Hypertension Father    No Known Allergies Prior to Admission medications   Medication Sig Start Date End Date Taking? Authorizing Provider  amLODipine (NORVASC) 10 MG tablet TAKE 1 TABLET EVERY DAY 03/26/15   Salley Scarlet, MD  aspirin EC 325 MG tablet Take 1 tablet (325 mg total) by mouth daily. 07/11/15   Tarry Kos, MD  atenolol (TENORMIN) 50 MG tablet Take 1 tablet (50 mg total) by mouth daily. 03/14/14   Salley Scarlet, MD  benazepril (LOTENSIN) 20 MG tablet TAKE 1 TABLET EVERY DAY 03/26/15   Salley Scarlet, MD  Black Cohosh (REMIFEMIN) 20 MG TABS Take 20 mg by mouth 2 (two) times daily.    Historical Provider, MD  Calcium Citrate (CITRACAL PO) Take 2 tablets  by mouth daily.     Historical Provider, MD  clindamycin (CLEOCIN) 300 MG capsule Take 1 capsule (300 mg total) by mouth 4 (four) times daily. X 7 days 08/14/15   Geoffery Lyons, MD  ferrous sulfate 325 (65 FE) MG tablet Take 325 mg by mouth daily with breakfast.  08/07/15   Historical Provider, MD  furosemide (LASIX) 20 MG tablet Take 1-2 tablets once a day as needed for leg swelling Patient taking differently: Take 20-40 mg by mouth daily. Take 1 tablet (20 mg) by mouth daily, may take an additional tablet (20 mg) as needed for feet swelling 04/21/15   Salley Scarlet, MD  glipiZIDE (GLUCOTROL) 10 MG tablet TAKE 1 TABLET TWICE DAILY BEFORE A MEAL 02/27/15   Salley Scarlet, MD  glucose blood (ACCU-CHEK AVIVA) test strip Use to monitor FSBS 1x daily. Dx: E11.9 12/31/14   Salley Scarlet, MD   HYDROcodone-acetaminophen (NORCO/VICODIN) 5-325 MG per tablet Take 0.5-1 tablets by mouth every 4 (four) hours as needed for moderate pain. Patient not taking: Reported on 08/15/2015 07/04/15   Burgess Amor, PA-C  imipramine (TOFRANIL) 50 MG tablet Take 1 tablet (50 mg total) by mouth 3 (three) times daily. 04/21/15   Salley Scarlet, MD  JANUVIA 25 MG tablet TAKE ONE TABLET BY MOUTH ONCE DAILY Patient taking differently: TAKE ONE TABLET BY MOUTH ONCE DAILY WITH BREAKFAST 06/04/15   Salley Scarlet, MD  lovastatin (MEVACOR) 10 MG tablet Take 1 tablet (10 mg total) by mouth at bedtime. Patient not taking: Reported on 08/15/2015 05/21/14   Salley Scarlet, MD  lovastatin (MEVACOR) 20 MG tablet Take 10 mg by mouth at bedtime.    Historical Provider, MD  metFORMIN (GLUCOPHAGE) 1000 MG tablet TAKE 1 TABLET TWICE DAILY WITH A MEAL Patient taking differently: Take 1,000 mg by mouth 2 (two) times daily with a meal.  04/21/15   Salley Scarlet, MD  oxyCODONE (OXY IR/ROXICODONE) 5 MG immediate release tablet Take 1-3 tablets (5-15 mg total) by mouth every 4 (four) hours as needed. Patient taking differently: Take 5-15 mg by mouth every 4 (four) hours as needed (pain).  07/11/15   Tarry Kos, MD  oxyCODONE (OXY IR/ROXICODONE) 5 MG immediate release tablet Take 1-3 tablets (5-15 mg total) by mouth every 4 (four) hours as needed. 08/21/15   Naiping Donnelly Stager, MD  vancomycin (VANCOCIN) 1 GM/200ML SOLN Inject 200 mLs (1,000 mg total) into the vein every 12 (twelve) hours. 08/21/15 09/26/15  Tarry Kos, MD     Positive ROS: All other systems have been reviewed and were otherwise negative with the exception of those mentioned in the HPI and as above.  Physical Exam: General: Alert, no acute distress Cardiovascular: No pedal edema Respiratory: No cyanosis, no use of accessory musculature GI: abdomen soft Skin: No lesions in the area of chief complaint Neurologic: Sensation intact distally Psychiatric: Patient is  competent for consent with normal mood and affect Lymphatic: no lymphedema  MUSCULOSKELETAL: exam stable  Assessment: Left knee wound  Plan: Plan for Procedure(s): LEFT KNEE SPLIT THICKNESS SKIN GRAFT,  IRRIGATION AND DEBRIDEMENT KNEE APPLICATION OF WOUND VAC  The risks benefits and alternatives were discussed with the patient including but not limited to the risks of nonoperative treatment, versus surgical intervention including infection, bleeding, nerve injury,  blood clots, cardiopulmonary complications, morbidity, mortality, among others, and they were willing to proceed.   Cheral Almas, MD   09/03/2015 8:24 AM

## 2015-09-09 ENCOUNTER — Encounter (HOSPITAL_COMMUNITY): Payer: Self-pay | Admitting: *Deleted

## 2015-09-10 ENCOUNTER — Encounter (HOSPITAL_COMMUNITY): Payer: Self-pay | Admitting: *Deleted

## 2015-09-10 ENCOUNTER — Ambulatory Visit (HOSPITAL_COMMUNITY): Payer: Commercial Managed Care - HMO | Admitting: Critical Care Medicine

## 2015-09-10 ENCOUNTER — Encounter (HOSPITAL_COMMUNITY)
Admission: RE | Disposition: A | Discharge status: Skilled Nursing Facility | Payer: Self-pay | Source: Ambulatory Visit | Attending: Orthopaedic Surgery

## 2015-09-10 ENCOUNTER — Inpatient Hospital Stay (HOSPITAL_COMMUNITY)
Admission: RE | Admit: 2015-09-10 | Discharge: 2015-09-16 | DRG: 488 | Disposition: A | Discharge status: Skilled Nursing Facility | Payer: Commercial Managed Care - HMO | Source: Ambulatory Visit | Attending: Orthopaedic Surgery | Admitting: Orthopaedic Surgery

## 2015-09-10 DIAGNOSIS — E669 Obesity, unspecified: Secondary | ICD-10-CM | POA: Diagnosis present

## 2015-09-10 DIAGNOSIS — Z79899 Other long term (current) drug therapy: Secondary | ICD-10-CM | POA: Diagnosis not present

## 2015-09-10 DIAGNOSIS — E785 Hyperlipidemia, unspecified: Secondary | ICD-10-CM | POA: Diagnosis present

## 2015-09-10 DIAGNOSIS — Z9119 Patient's noncompliance with other medical treatment and regimen: Secondary | ICD-10-CM | POA: Diagnosis not present

## 2015-09-10 DIAGNOSIS — Z7984 Long term (current) use of oral hypoglycemic drugs: Secondary | ICD-10-CM

## 2015-09-10 DIAGNOSIS — M1990 Unspecified osteoarthritis, unspecified site: Secondary | ICD-10-CM | POA: Diagnosis present

## 2015-09-10 DIAGNOSIS — Z7982 Long term (current) use of aspirin: Secondary | ICD-10-CM

## 2015-09-10 DIAGNOSIS — S81002A Unspecified open wound, left knee, initial encounter: Secondary | ICD-10-CM | POA: Diagnosis present

## 2015-09-10 DIAGNOSIS — Z6836 Body mass index (BMI) 36.0-36.9, adult: Secondary | ICD-10-CM

## 2015-09-10 DIAGNOSIS — D62 Acute posthemorrhagic anemia: Secondary | ICD-10-CM | POA: Diagnosis not present

## 2015-09-10 DIAGNOSIS — F329 Major depressive disorder, single episode, unspecified: Secondary | ICD-10-CM | POA: Diagnosis present

## 2015-09-10 DIAGNOSIS — E119 Type 2 diabetes mellitus without complications: Secondary | ICD-10-CM | POA: Diagnosis present

## 2015-09-10 DIAGNOSIS — S76112A Strain of left quadriceps muscle, fascia and tendon, initial encounter: Secondary | ICD-10-CM | POA: Diagnosis present

## 2015-09-10 DIAGNOSIS — T814XXA Infection following a procedure, initial encounter: Secondary | ICD-10-CM | POA: Diagnosis present

## 2015-09-10 DIAGNOSIS — S81009A Unspecified open wound, unspecified knee, initial encounter: Secondary | ICD-10-CM | POA: Diagnosis present

## 2015-09-10 DIAGNOSIS — I10 Essential (primary) hypertension: Secondary | ICD-10-CM | POA: Diagnosis present

## 2015-09-10 HISTORY — PX: I & D EXTREMITY: SHX5045

## 2015-09-10 HISTORY — PX: SKIN SPLIT GRAFT: SHX444

## 2015-09-10 HISTORY — PX: APPLICATION OF WOUND VAC: SHX5189

## 2015-09-10 LAB — BASIC METABOLIC PANEL
ANION GAP: 10 (ref 5–15)
BUN: 17 mg/dL (ref 6–20)
CALCIUM: 9 mg/dL (ref 8.9–10.3)
CO2: 25 mmol/L (ref 22–32)
CREATININE: 0.99 mg/dL (ref 0.44–1.00)
Chloride: 95 mmol/L — ABNORMAL LOW (ref 101–111)
GFR, EST NON AFRICAN AMERICAN: 56 mL/min — AB (ref >60)
Glucose, Bld: 131 mg/dL — ABNORMAL HIGH (ref 65–99)
Potassium: 4.7 mmol/L (ref 3.5–5.1)
SODIUM: 130 mmol/L — AB (ref 135–145)

## 2015-09-10 LAB — CBC
HCT: 26.7 % — ABNORMAL LOW (ref 36.0–46.0)
HEMOGLOBIN: 8.8 g/dL — AB (ref 12.0–15.0)
MCH: 26.6 pg (ref 26.0–34.0)
MCHC: 33 g/dL (ref 30.0–36.0)
MCV: 80.7 fL (ref 78.0–100.0)
PLATELETS: 263 10*3/uL (ref 150–400)
RBC: 3.31 MIL/uL — AB (ref 3.87–5.11)
RDW: 15 % (ref 11.5–15.5)
WBC: 6.8 10*3/uL (ref 4.0–10.5)

## 2015-09-10 LAB — GLUCOSE, CAPILLARY
Glucose-Capillary: 130 mg/dL — ABNORMAL HIGH (ref 65–99)
Glucose-Capillary: 113 mg/dL — ABNORMAL HIGH (ref 65–99)
Glucose-Capillary: 110 mg/dL — ABNORMAL HIGH (ref 65–99)
Glucose-Capillary: 126 mg/dL — ABNORMAL HIGH (ref 65–99)
Glucose-Capillary: 190 mg/dL — ABNORMAL HIGH (ref 65–99)

## 2015-09-10 SURGERY — IRRIGATION AND DEBRIDEMENT EXTREMITY
Anesthesia: General | Laterality: Left

## 2015-09-10 MED ORDER — METOCLOPRAMIDE HCL 5 MG/ML IJ SOLN: 5.0000 mg | Freq: Three times a day (TID) | INTRAMUSCULAR | Status: DC | PRN

## 2015-09-10 MED ORDER — ACETAMINOPHEN 650 MG RE SUPP: 650.0000 mg | Freq: Four times a day (QID) | RECTAL | Status: DC | PRN

## 2015-09-10 MED ORDER — SITAGLIPTIN PHOSPHATE 25 MG PO TABS
25.0000 mg | ORAL_TABLET | Freq: Every day | ORAL | Status: DC
  Administered 2015-09-11 – 2015-09-15 (×5): 25 mg via ORAL
  Filled 2015-09-10 (×7): qty 1

## 2015-09-10 MED ORDER — ONDANSETRON HCL 4 MG PO TABS: 4.0000 mg | ORAL_TABLET | Freq: Four times a day (QID) | ORAL | Status: DC | PRN

## 2015-09-10 MED ORDER — SODIUM CHLORIDE 0.9 % IR SOLN
Status: DC | PRN
  Administered 2015-09-10: 1000 mL

## 2015-09-10 MED ORDER — ACETAMINOPHEN 325 MG PO TABS
650.0000 mg | ORAL_TABLET | Freq: Four times a day (QID) | ORAL | Status: DC | PRN
  Administered 2015-09-13 – 2015-09-15 (×3): 650 mg via ORAL
  Filled 2015-09-10 (×2): qty 2

## 2015-09-10 MED ORDER — ATENOLOL 50 MG PO TABS
50.0000 mg | ORAL_TABLET | Freq: Every day | ORAL | Status: DC
  Administered 2015-09-12 – 2015-09-16 (×5): 50 mg via ORAL
  Filled 2015-09-10 (×7): qty 1

## 2015-09-10 MED ORDER — FENTANYL CITRATE (PF) 100 MCG/2ML IJ SOLN: 25.0000 ug | INTRAMUSCULAR | Status: DC | PRN

## 2015-09-10 MED ORDER — ONDANSETRON HCL 4 MG/2ML IJ SOLN
4.0000 mg | Freq: Four times a day (QID) | INTRAMUSCULAR | Status: DC | PRN
  Administered 2015-09-12 – 2015-09-14 (×2): 4 mg via INTRAVENOUS
  Filled 2015-09-10: qty 2

## 2015-09-10 MED ORDER — DIPHENHYDRAMINE HCL 12.5 MG/5ML PO ELIX: 25.0000 mg | ORAL_SOLUTION | ORAL | Status: DC | PRN

## 2015-09-10 MED ORDER — LACTATED RINGERS IV SOLN
INTRAVENOUS | Status: DC
  Administered 2015-09-10: 12:00:00 via INTRAVENOUS

## 2015-09-10 MED ORDER — ATENOLOL 50 MG PO TABS
50.0000 mg | ORAL_TABLET | Freq: Once | ORAL | Status: AC
  Administered 2015-09-10: 50 mg via ORAL
  Filled 2015-09-10 (×2): qty 1

## 2015-09-10 MED ORDER — MIDAZOLAM HCL 5 MG/5ML IJ SOLN
INTRAMUSCULAR | Status: DC | PRN
  Administered 2015-09-10: 2 mg via INTRAVENOUS

## 2015-09-10 MED ORDER — PROPOFOL 10 MG/ML IV BOLUS
INTRAVENOUS | Status: DC | PRN
  Administered 2015-09-10: 150 mg via INTRAVENOUS

## 2015-09-10 MED ORDER — AMLODIPINE BESYLATE 10 MG PO TABS
10.0000 mg | ORAL_TABLET | Freq: Every day | ORAL | Status: DC
  Administered 2015-09-10 – 2015-09-16 (×7): 10 mg via ORAL
  Filled 2015-09-10 (×7): qty 1

## 2015-09-10 MED ORDER — LIDOCAINE HCL (CARDIAC) 20 MG/ML IV SOLN
INTRAVENOUS | Status: AC
  Filled 2015-09-10: qty 5

## 2015-09-10 MED ORDER — FUROSEMIDE 20 MG PO TABS
20.0000 mg | ORAL_TABLET | Freq: Every day | ORAL | Status: DC
  Administered 2015-09-11 – 2015-09-16 (×6): 20 mg via ORAL
  Filled 2015-09-10 (×3): qty 1
  Filled 2015-09-10: qty 2
  Filled 2015-09-10 (×3): qty 1

## 2015-09-10 MED ORDER — IMIPRAMINE HCL 50 MG PO TABS
50.0000 mg | ORAL_TABLET | Freq: Three times a day (TID) | ORAL | Status: DC
  Administered 2015-09-10 – 2015-09-16 (×17): 50 mg via ORAL
  Filled 2015-09-10 (×17): qty 1

## 2015-09-10 MED ORDER — PROPOFOL 10 MG/ML IV BOLUS
INTRAVENOUS | Status: AC
  Filled 2015-09-10: qty 20

## 2015-09-10 MED ORDER — MORPHINE SULFATE (PF) 2 MG/ML IV SOLN: 1.0000 mg | INTRAVENOUS | Status: DC | PRN

## 2015-09-10 MED ORDER — METHOCARBAMOL 500 MG PO TABS
500.0000 mg | ORAL_TABLET | Freq: Four times a day (QID) | ORAL | Status: DC | PRN
  Administered 2015-09-11 – 2015-09-14 (×3): 500 mg via ORAL
  Filled 2015-09-10 (×5): qty 1

## 2015-09-10 MED ORDER — OXYCODONE HCL 5 MG PO TABS
5.0000 mg | ORAL_TABLET | ORAL | Status: DC | PRN
  Administered 2015-09-10 – 2015-09-11 (×4): 10 mg via ORAL
  Administered 2015-09-12 (×2): 5 mg via ORAL
  Administered 2015-09-12: 10 mg via ORAL
  Filled 2015-09-10 (×5): qty 2
  Filled 2015-09-10 (×2): qty 1

## 2015-09-10 MED ORDER — MEPERIDINE HCL 25 MG/ML IJ SOLN: 6.2500 mg | INTRAMUSCULAR | Status: DC | PRN

## 2015-09-10 MED ORDER — CEFAZOLIN SODIUM-DEXTROSE 2-3 GM-% IV SOLR
2.0000 g | Freq: Once | INTRAVENOUS | Status: AC
  Administered 2015-09-10: 2 g via INTRAVENOUS

## 2015-09-10 MED ORDER — PRAVASTATIN SODIUM 20 MG PO TABS
10.0000 mg | ORAL_TABLET | Freq: Every day | ORAL | Status: DC
  Administered 2015-09-10 – 2015-09-15 (×6): 10 mg via ORAL
  Filled 2015-09-10 (×6): qty 1

## 2015-09-10 MED ORDER — MIDAZOLAM HCL 2 MG/2ML IJ SOLN
INTRAMUSCULAR | Status: AC
  Filled 2015-09-10: qty 4

## 2015-09-10 MED ORDER — METOCLOPRAMIDE HCL 5 MG PO TABS: 5.0000 mg | ORAL_TABLET | Freq: Three times a day (TID) | ORAL | Status: DC | PRN

## 2015-09-10 MED ORDER — ASPIRIN EC 325 MG PO TBEC
325.0000 mg | DELAYED_RELEASE_TABLET | Freq: Every day | ORAL | Status: DC
  Administered 2015-09-11: 325 mg via ORAL
  Filled 2015-09-10: qty 1

## 2015-09-10 MED ORDER — LIDOCAINE HCL (CARDIAC) 10 MG/ML IV SOLN
INTRAVENOUS | Status: DC | PRN
Start: 2015-09-10 — End: 2015-09-10
  Administered 2015-09-10: 100 mg via INTRAVENOUS

## 2015-09-10 MED ORDER — BENAZEPRIL HCL 20 MG PO TABS
20.0000 mg | ORAL_TABLET | Freq: Every day | ORAL | Status: DC
  Administered 2015-09-10 – 2015-09-16 (×7): 20 mg via ORAL
  Filled 2015-09-10 (×7): qty 1

## 2015-09-10 MED ORDER — SODIUM CHLORIDE 0.9 % IV SOLN
INTRAVENOUS | Status: DC
  Administered 2015-09-10 – 2015-09-13 (×6): via INTRAVENOUS

## 2015-09-10 MED ORDER — NON FORMULARY: 25.0000 mg | Freq: Every day | Status: DC

## 2015-09-10 MED ORDER — PROMETHAZINE HCL 25 MG/ML IJ SOLN: 6.2500 mg | INTRAMUSCULAR | Status: DC | PRN

## 2015-09-10 MED ORDER — GLIPIZIDE 5 MG PO TABS
10.0000 mg | ORAL_TABLET | Freq: Every day | ORAL | Status: DC
  Administered 2015-09-11 – 2015-09-16 (×6): 10 mg via ORAL
  Filled 2015-09-10 (×6): qty 2

## 2015-09-10 MED ORDER — VANCOMYCIN HCL IN DEXTROSE 1-5 GM/200ML-% IV SOLN
1000.0000 mg | Freq: Two times a day (BID) | INTRAVENOUS | Status: DC
  Administered 2015-09-10 – 2015-09-12 (×5): 1000 mg via INTRAVENOUS
  Filled 2015-09-10 (×6): qty 200

## 2015-09-10 MED ORDER — METHOCARBAMOL 1000 MG/10ML IJ SOLN
500.0000 mg | Freq: Four times a day (QID) | INTRAVENOUS | Status: DC | PRN
  Filled 2015-09-10: qty 5

## 2015-09-10 MED ORDER — FENTANYL CITRATE (PF) 250 MCG/5ML IJ SOLN
INTRAMUSCULAR | Status: AC
  Filled 2015-09-10: qty 5

## 2015-09-10 MED ORDER — FENTANYL CITRATE (PF) 250 MCG/5ML IJ SOLN
INTRAMUSCULAR | Status: DC | PRN
  Administered 2015-09-10 (×3): 25 ug via INTRAVENOUS

## 2015-09-10 MED ORDER — ONDANSETRON HCL 4 MG/2ML IJ SOLN
INTRAMUSCULAR | Status: DC | PRN
  Administered 2015-09-10: 4 mg via INTRAVENOUS

## 2015-09-10 MED ORDER — FERROUS SULFATE 325 (65 FE) MG PO TABS
325.0000 mg | ORAL_TABLET | Freq: Every day | ORAL | Status: DC
  Administered 2015-09-11 – 2015-09-16 (×5): 325 mg via ORAL
  Filled 2015-09-10 (×6): qty 1

## 2015-09-10 SURGICAL SUPPLY — 48 items
BANDAGE ELASTIC 6 VELCRO ST LF (GAUZE/BANDAGES/DRESSINGS) IMPLANT
BLADE DERMATOME SS (BLADE) ×3 IMPLANT
BNDG COHESIVE 1X5 TAN STRL LF (GAUZE/BANDAGES/DRESSINGS) IMPLANT
BNDG COHESIVE 4X5 TAN STRL (GAUZE/BANDAGES/DRESSINGS) ×3 IMPLANT
BNDG CONFORM 3 STRL LF (GAUZE/BANDAGES/DRESSINGS) IMPLANT
CANISTER SUCTION 2500CC (MISCELLANEOUS) IMPLANT
CORDS BIPOLAR (ELECTRODE) IMPLANT
COVER SURGICAL LIGHT HANDLE (MISCELLANEOUS) ×3 IMPLANT
CUFF TOURNIQUET SINGLE 24IN (TOURNIQUET CUFF) IMPLANT
CUFF TOURNIQUET SINGLE 34IN LL (TOURNIQUET CUFF) ×6 IMPLANT
DERMACARRIERS GRAFT 1 TO 1.5 (DISPOSABLE) ×3
DRAPE INCISE IOBAN 66X45 STRL (DRAPES) ×6 IMPLANT
DRAPE SURG 17X23 STRL (DRAPES) IMPLANT
DRAPE U-SHAPE 47X51 STRL (DRAPES) ×3 IMPLANT
DRSG ADAPTIC 3X8 NADH LF (GAUZE/BANDAGES/DRESSINGS) ×2 IMPLANT
DRSG PAD ABDOMINAL 8X10 ST (GAUZE/BANDAGES/DRESSINGS) IMPLANT
DRSG VAC ATS SM SENSATRAC (GAUZE/BANDAGES/DRESSINGS) ×2 IMPLANT
ELECT REM PT RETURN 9FT ADLT (ELECTROSURGICAL) ×3
ELECTRODE REM PT RTRN 9FT ADLT (ELECTROSURGICAL) IMPLANT
GAUZE SPONGE 4X4 12PLY STRL (GAUZE/BANDAGES/DRESSINGS) ×6 IMPLANT
GAUZE XEROFORM 1X8 LF (GAUZE/BANDAGES/DRESSINGS) ×3 IMPLANT
GLOVE NEODERM STRL 7.5 LF PF (GLOVE) ×2 IMPLANT
GLOVE ORTHO TXT STRL SZ7.5 (GLOVE) ×3 IMPLANT
GLOVE SURG NEODERM 7.5  LF PF (GLOVE) ×4
GOWN STRL REIN XL XLG (GOWN DISPOSABLE) ×6 IMPLANT
GRAFT DERMACARRIERS 1 TO 1.5 (DISPOSABLE) IMPLANT
HANDPIECE INTERPULSE COAX TIP (DISPOSABLE) ×3
KIT BASIN OR (CUSTOM PROCEDURE TRAY) ×3 IMPLANT
KIT ROOM TURNOVER OR (KITS) ×3 IMPLANT
MANIFOLD NEPTUNE II (INSTRUMENTS) ×3 IMPLANT
NS IRRIG 1000ML POUR BTL (IV SOLUTION) ×6 IMPLANT
PACK ORTHO EXTREMITY (CUSTOM PROCEDURE TRAY) ×3 IMPLANT
PAD ABD 8X10 STRL (GAUZE/BANDAGES/DRESSINGS) ×3 IMPLANT
PAD ARMBOARD 7.5X6 YLW CONV (MISCELLANEOUS) ×6 IMPLANT
PAD NEG PRESSURE SENSATRAC (MISCELLANEOUS) ×2 IMPLANT
PADDING CAST ABS 4INX4YD NS (CAST SUPPLIES) ×4
PADDING CAST ABS COTTON 4X4 ST (CAST SUPPLIES) ×2 IMPLANT
PADDING CAST COTTON 6X4 STRL (CAST SUPPLIES) ×3 IMPLANT
SET HNDPC FAN SPRY TIP SCT (DISPOSABLE) IMPLANT
SUT ETHILON 2 0 FS 18 (SUTURE) ×9 IMPLANT
SUT ETHILON 2 0 PSLX (SUTURE) ×3 IMPLANT
SUT ETHILON 3 0 PS 1 (SUTURE) ×6 IMPLANT
SUT MNCRL AB 4-0 PS2 18 (SUTURE) ×3 IMPLANT
SUT VIC AB 2-0 CT1 36 (SUTURE) ×3 IMPLANT
SUT VIC AB 2-0 FS1 27 (SUTURE) ×6 IMPLANT
TOWEL OR 17X26 10 PK STRL BLUE (TOWEL DISPOSABLE) ×3 IMPLANT
UNDERPAD 30X30 INCONTINENT (UNDERPADS AND DIAPERS) ×6 IMPLANT
WATER STERILE IRR 1000ML POUR (IV SOLUTION) ×3 IMPLANT

## 2015-09-10 NOTE — Anesthesia Postprocedure Evaluation (Signed)
  Anesthesia Post-op Note  Patient: Lindsay Cobb  Procedure(s) Performed: Procedure(s): LEFT KNEE SPLIT THICKNESS SKIN GRAFT, IRRIGATION AND DEBRIDEMENT, WOUND VAC (Left) SKIN GRAFT SPLIT THICKNESS (Left) APPLICATION OF WOUND VAC (Left)  Patient Location: PACU  Anesthesia Type:General  Level of Consciousness: awake and alert   Airway and Oxygen Therapy: Patient Spontanous Breathing  Post-op Pain: mild  Post-op Assessment: Post-op Vital signs reviewed, Patient's Cardiovascular Status Stable, Respiratory Function Stable and Patent Airway              Post-op Vital Signs: Reviewed and stable  Last Vitals:  Filed Vitals:   09/10/15 1409  BP:   Pulse: 64  Temp: 37 C  Resp: 14    Complications: No apparent anesthesia complications

## 2015-09-10 NOTE — Op Note (Signed)
   Date of surgery: 09/10/2015  Preoperative diagnosis: Left open knee wound 5 x 5 cm  Postoperative diagnosis: Same  Operative findings: Left open knee wound 5 x 5 cm with good granulation tissue and exposed patellar tendon with rerupture of her patellar tendon repair.  Procedure: 1. Irrigation and debridement of bone, tendon, subcutaneous tissue 5 x 5 cm 2. Application of negative pressure wound therapy less than 50 cm  Surgeon: Glee Arvin, M.D.  Anesthesia: General  Estimated blood loss: Minimal  Drains: None  Condition is: None  Condition to PACU: Stable  Indications for procedure: Lindsay Cobb is a 74 year old female who presents today for debridement of her knee wound with possible split thickness skin grafting. She was aware of the risks benefits alternatives to surgery she wished to proceed.  Description of procedure: The patient was identified in the preoperative holding area. The operative site was marked by the surgeon confirmed with the patient. She was brought back to the operating room. General anesthesia was administered. Left lower extremity was prepped and draped in standard sterile fashion.  The wound was examined and there was good granulation tissue and good acceptance of the Integra. The top layer of the Integra was removed. He did find that there was a rerupture of her patellar tendon repair likely do to noncompliance with the brace and continued flexion of her knee. Sharp excisional debridement of the bone, tendon, subcutaneous tissue measuring 5 x 5 cm was carried out. This was done with a rongeur and curette. We then thoroughly irrigated the wound with 3 L of normal saline using pulse lavage. We then placed a wound VAC over the wound and turned the suction to -75 mmHg. The patient tolerated the procedure well.  Disposition: Patient will be nonweightbearing to the left lower extremity. She will return back to the operating room for repeat I&D. She does have a  difficult issue with her patellar tendon repair as she has reruptured the repair and now there is a soft tissue defect that measures approximately 3-4 cm.  Lindsay Reel, MD [facility] [phone] 2:10 PM

## 2015-09-10 NOTE — Transfer of Care (Signed)
Immediate Anesthesia Transfer of Care Note  Patient: THU BAGGETT  Procedure(s) Performed: Procedure(s): LEFT KNEE SPLIT THICKNESS SKIN GRAFT, IRRIGATION AND DEBRIDEMENT, WOUND VAC (Left) SKIN GRAFT SPLIT THICKNESS (Left) APPLICATION OF WOUND VAC (Left)  Patient Location: PACU  Anesthesia Type:General  Level of Consciousness: awake and alert   Airway & Oxygen Therapy: Patient Spontanous Breathing and Patient connected to nasal cannula oxygen  Post-op Assessment: Report given to RN and Post -op Vital signs reviewed and stable  Post vital signs: Reviewed and stable  Last Vitals:  Filed Vitals:   09/10/15 1033  BP: 115/48  Pulse: 77  Temp: 36.5 C  Resp: 20    Complications: No apparent anesthesia complications

## 2015-09-10 NOTE — Anesthesia Procedure Notes (Signed)
Procedure Name: LMA Insertion Date/Time: 09/10/2015 1:26 PM Performed by: Glo Herring B Pre-anesthesia Checklist: Patient identified, Emergency Drugs available, Suction available, Patient being monitored and Timeout performed Patient Re-evaluated:Patient Re-evaluated prior to inductionOxygen Delivery Method: Circle system utilized Preoxygenation: Pre-oxygenation with 100% oxygen Intubation Type: IV induction LMA: LMA inserted LMA Size: 4.0 Number of attempts: 1 Placement Confirmation: positive ETCO2,  CO2 detector and breath sounds checked- equal and bilateral Tube secured with: Tape Dental Injury: Teeth and Oropharynx as per pre-operative assessment

## 2015-09-10 NOTE — H&P (Signed)
PREOPERATIVE H&P  Chief Complaint: Left knee wound  HPI: Lindsay Cobb is a 74 y.o. female who presents for surgical treatment of Left knee wound.  She denies any changes in medical history.  Past Medical History  Diagnosis Date  . Diabetes mellitus     takes Januvia,Metformin,and Glipizide daily  . Hyperlipidemia     takes Lovastatin nightly  . Anemia     takes Ferrous Gluconate daily  . Peripheral edema     takes Furosemide daily as needed  . Depression     takes Tofranil daily  . Hypertension     takes Amlodipine,Atenolol,and Benazepril daily  . Arthritis   . Joint pain   . Joint swelling   . Cataracts, bilateral     immature  . Infection of skin of knee     left   Past Surgical History  Procedure Laterality Date  . Breast biopsy Left   . Colonoscopy    . Esophagogastroduodenoscopy    . Ercp    . Patellectomy Left 07/11/2015    Procedure: Left Partial PATELLECTOMY;  Surgeon: Tarry Kos, MD;  [facility];  Service: Orthopedics;  Laterality: Left;  . Irrigation and debridement knee Left 08/15/2015    Procedure: IRRIGATION AND DEBRIDEMENT LEFT KNEE;  Surgeon: Tarry Kos, MD;  [facility];  Service: Orthopedics;  Laterality: Left;  . Patellectomy Left 08/15/2015    Procedure: Revision PATELLECTOMY;  Surgeon: Tarry Kos, MD;  [facility];  Service: Orthopedics;  Laterality: Left;  . I&d extremity Left 08/18/2015    Procedure: IRRIGATION AND DEBRIDEMENT EXTREMITY;  Surgeon: Tarry Kos, MD;  [facility];  Service: Orthopedics;  Laterality: Left;  Marland Kitchen Minor application of wound vac  08/18/2015    Procedure: MINOR APPLICATION OF WOUND VAC;  Surgeon: Tarry Kos, MD;  [facility]: [facility];  Service: Orthopedics;;  . Irrigation and debridement knee Left 08/20/2015    Procedure: IRRIGATION AND DEBRIDEMENT KNEE WITH PLACEMENT OF INTEGRA;  Surgeon: Tarry Kos, MD;  [facility];  Service: Orthopedics;  Laterality: Left;   Social History   Social History    . Marital Status: Married    Spouse Name: N/A  . Number of Children: N/A  . Years of Education: N/A   Social History Main Topics  . Smoking status: Never Smoker   . Smokeless tobacco: Never Used  . Alcohol Use: No  . Drug Use: No  . Sexual Activity: Not Asked   Other Topics Concern  . None   Social History Narrative   Family History  Problem Relation Age of Onset  . Hypertension Mother   . Diabetes Mother   . Hypertension Father    No Known Allergies Prior to Admission medications   Medication Sig Start Date End Date Taking? Authorizing Provider  amLODipine (NORVASC) 10 MG tablet TAKE 1 TABLET EVERY DAY 03/26/15   Salley Scarlet, MD  aspirin EC 325 MG tablet Take 1 tablet (325 mg total) by mouth daily. 07/11/15   Tarry Kos, MD  atenolol (TENORMIN) 50 MG tablet Take 1 tablet (50 mg total) by mouth daily. 03/14/14   Salley Scarlet, MD  benazepril (LOTENSIN) 20 MG tablet TAKE 1 TABLET EVERY DAY 03/26/15   Salley Scarlet, MD  Black Cohosh (REMIFEMIN) 20 MG TABS Take 20 mg by mouth 2 (two) times daily.    Historical Provider, MD  Calcium Citrate (CITRACAL PO) Take 2 tablets by mouth daily.  Historical Provider, MD  clindamycin (CLEOCIN) 300 MG capsule Take 1 capsule (300 mg total) by mouth 4 (four) times daily. X 7 days 08/14/15   Geoffery Lyons, MD  ferrous sulfate 325 (65 FE) MG tablet Take 325 mg by mouth daily with breakfast.  08/07/15   Historical Provider, MD  furosemide (LASIX) 20 MG tablet Take 1-2 tablets once a day as needed for leg swelling Patient taking differently: Take 20-40 mg by mouth daily. Take 1 tablet (20 mg) by mouth daily, may take an additional tablet (20 mg) as needed for feet swelling 04/21/15   Salley Scarlet, MD  glipiZIDE (GLUCOTROL) 10 MG tablet TAKE 1 TABLET TWICE DAILY BEFORE A MEAL 02/27/15   Salley Scarlet, MD  glucose blood (ACCU-CHEK AVIVA) test strip Use to monitor FSBS 1x daily. Dx: E11.9 12/31/14   Salley Scarlet, MD   HYDROcodone-acetaminophen (NORCO/VICODIN) 5-325 MG per tablet Take 0.5-1 tablets by mouth every 4 (four) hours as needed for moderate pain. Patient not taking: Reported on 08/15/2015 07/04/15   Burgess Amor, PA-C  imipramine (TOFRANIL) 50 MG tablet Take 1 tablet (50 mg total) by mouth 3 (three) times daily. 04/21/15   Salley Scarlet, MD  JANUVIA 25 MG tablet TAKE ONE TABLET BY MOUTH ONCE DAILY Patient taking differently: TAKE ONE TABLET BY MOUTH ONCE DAILY WITH BREAKFAST 06/04/15   Salley Scarlet, MD  lovastatin (MEVACOR) 10 MG tablet Take 1 tablet (10 mg total) by mouth at bedtime. Patient not taking: Reported on 08/15/2015 05/21/14   Salley Scarlet, MD  lovastatin (MEVACOR) 20 MG tablet Take 10 mg by mouth at bedtime.    Historical Provider, MD  metFORMIN (GLUCOPHAGE) 1000 MG tablet TAKE 1 TABLET TWICE DAILY WITH A MEAL Patient taking differently: Take 1,000 mg by mouth 2 (two) times daily with a meal.  04/21/15   Salley Scarlet, MD  oxyCODONE (OXY IR/ROXICODONE) 5 MG immediate release tablet Take 1-3 tablets (5-15 mg total) by mouth every 4 (four) hours as needed. Patient taking differently: Take 5-15 mg by mouth every 4 (four) hours as needed (pain).  07/11/15   Tarry Kos, MD  oxyCODONE (OXY IR/ROXICODONE) 5 MG immediate release tablet Take 1-3 tablets (5-15 mg total) by mouth every 4 (four) hours as needed. 08/21/15   Naiping Donnelly Stager, MD  vancomycin (VANCOCIN) 1 GM/200ML SOLN Inject 200 mLs (1,000 mg total) into the vein every 12 (twelve) hours. 08/21/15 09/26/15  Tarry Kos, MD     Positive ROS: All other systems have been reviewed and were otherwise negative with the exception of those mentioned in the HPI and as above.  Physical Exam: General: Alert, no acute distress Cardiovascular: No pedal edema Respiratory: No cyanosis, no use of accessory musculature GI: abdomen soft Skin: No lesions in the area of chief complaint Neurologic: Sensation intact distally Psychiatric: Patient is  competent for consent with normal mood and affect Lymphatic: no lymphedema  MUSCULOSKELETAL: exam stable  Assessment: Left knee wound  Plan: Plan for Procedure(s): LEFT KNEE SPLIT THICKNESS SKIN GRAFT, IRRIGATION AND DEBRIDEMENT, WOUND VAC SKIN GRAFT SPLIT THICKNESS APPLICATION OF WOUND VAC  The risks benefits and alternatives were discussed with the patient including but not limited to the risks of nonoperative treatment, versus surgical intervention including infection, bleeding, nerve injury,  blood clots, cardiopulmonary complications, morbidity, mortality, among others, and they were willing to proceed.   Cheral Almas, MD   09/10/2015 7:47 AM

## 2015-09-10 NOTE — Anesthesia Preprocedure Evaluation (Addendum)
Anesthesia Evaluation  Patient identified by MRN, date of birth, ID band Patient awake    Airway Mallampati: II   Neck ROM: Full    Dental  (+) Edentulous Upper, Edentulous Lower   Pulmonary    breath sounds clear to auscultation       Cardiovascular hypertension, Pt. on medications  Rhythm:Regular     Neuro/Psych    GI/Hepatic   Endo/Other  diabetes, Type 2  Renal/GU      Musculoskeletal  (+) Arthritis ,   Abdominal (+)  Abdomen: soft.    Peds  Hematology  (+) anemia ,   Anesthesia Other Findings   Reproductive/Obstetrics                            Anesthesia Physical Anesthesia Plan  ASA: III  Anesthesia Plan: General   Post-op Pain Management:    Induction: Intravenous  Airway Management Planned: LMA and Oral ETT  Additional Equipment:   Intra-op Plan:   Post-operative Plan: Extubation in OR  Informed Consent: I have reviewed the patients History and Physical, chart, labs and discussed the procedure including the risks, benefits and alternatives for the proposed anesthesia with the patient or authorized representative who has indicated his/her understanding and acceptance.     Plan Discussed with:   Anesthesia Plan Comments:         Anesthesia Quick Evaluation

## 2015-09-11 ENCOUNTER — Encounter (HOSPITAL_COMMUNITY): Payer: Self-pay | Admitting: Orthopaedic Surgery

## 2015-09-11 LAB — BASIC METABOLIC PANEL
Anion gap: 6 (ref 5–15)
BUN: 11 mg/dL (ref 6–20)
CALCIUM: 8.7 mg/dL — AB (ref 8.9–10.3)
CO2: 26 mmol/L (ref 22–32)
CREATININE: 1.07 mg/dL — AB (ref 0.44–1.00)
Chloride: 101 mmol/L (ref 101–111)
GFR calc non Af Amer: 51 mL/min — ABNORMAL LOW (ref >60)
GFR, EST AFRICAN AMERICAN: 59 mL/min — AB (ref >60)
GLUCOSE: 227 mg/dL — AB (ref 65–99)
Potassium: 4.4 mmol/L (ref 3.5–5.1)
Sodium: 133 mmol/L — ABNORMAL LOW (ref 135–145)

## 2015-09-11 LAB — GLUCOSE, CAPILLARY
Glucose-Capillary: 155 mg/dL — ABNORMAL HIGH (ref 65–99)
Glucose-Capillary: 123 mg/dL — ABNORMAL HIGH (ref 65–99)
Glucose-Capillary: 76 mg/dL (ref 65–99)
Glucose-Capillary: 135 mg/dL — ABNORMAL HIGH (ref 65–99)

## 2015-09-11 MED ORDER — INSULIN ASPART 100 UNIT/ML ~~LOC~~ SOLN
0.0000 [IU] | Freq: Three times a day (TID) | SUBCUTANEOUS | Status: DC
  Administered 2015-09-11: 4 [IU] via SUBCUTANEOUS
  Administered 2015-09-11 – 2015-09-12 (×2): 3 [IU] via SUBCUTANEOUS
  Administered 2015-09-12: 4 [IU] via SUBCUTANEOUS
  Administered 2015-09-12 – 2015-09-14 (×2): 3 [IU] via SUBCUTANEOUS
  Administered 2015-09-14: 7 [IU] via SUBCUTANEOUS
  Administered 2015-09-15 – 2015-09-16 (×2): 3 [IU] via SUBCUTANEOUS

## 2015-09-11 MED ORDER — INSULIN ASPART 100 UNIT/ML ~~LOC~~ SOLN: 0.0000 [IU] | Freq: Every day | SUBCUTANEOUS | Status: DC

## 2015-09-11 NOTE — Consult Note (Signed)
Reason for Consult: open wound left knee, history deep joint infection, patellar tendon rupture recurrent Referring Physician: Dr. Jacklynn Barnacle Location: [name] - inpatient Date: 09/11/2015  Lindsay Cobb is an 74 y.o. female.   HPI: Patient with fall on 07/04/2015 with closed left patellar fracture. Underwent operative repair 07/11/15 and readmitted 08/2015 with wound infection, noted to be both superficial and deep knee joint with rupture of tendon repair. 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 Underwent tendon advancement and repair on 9.9.16. This was followed by Integra placement 08/20/2015. She was readmitted 09/10/15 for possible skin graft. She was noted intraoperatively to have re-rupture tendon. Following debridement there is now defect tendon 3-4 cm.  PMH significant for DM, obesity, HTN. Last Hb A1c 7.3. Prior to this injury patient independent living.   Past Medical History  Diagnosis Date  . Diabetes mellitus     takes Januvia,Metformin,and Glipizide daily  . Hyperlipidemia     takes Lovastatin nightly  . Anemia     takes Ferrous Gluconate daily  . Peripheral edema     takes Furosemide daily as needed  . Depression     takes Tofranil daily  . Hypertension     takes Amlodipine,Atenolol,and Benazepril daily  . Arthritis   . Joint pain   . Joint swelling   . Cataracts, bilateral     immature  . Infection of skin of knee     left    Past Surgical History  Procedure Laterality Date  . Breast biopsy Left   . Colonoscopy    . Esophagogastroduodenoscopy    . Ercp    . Patellectomy Left 07/11/2015    Procedure: Left Partial PATELLECTOMY;  Surgeon: Tarry Kos, MD;  [facility];  Service: Orthopedics;  Laterality: Left;  . Irrigation and debridement knee Left 08/15/2015    Procedure: IRRIGATION AND DEBRIDEMENT LEFT KNEE;  Surgeon: Tarry Kos, MD;  [facility]: [facility];  Service: Orthopedics;  Laterality: Left;  . Patellectomy Left 08/15/2015    Procedure: Revision  PATELLECTOMY;  Surgeon: Tarry Kos, MD;  [facility]: [facility];  Service: Orthopedics;  Laterality: Left;  . I&d extremity Left 08/18/2015    Procedure: IRRIGATION AND DEBRIDEMENT EXTREMITY;  Surgeon: Tarry Kos, MD;  [facility];  Service: Orthopedics;  Laterality: Left;  Marland Kitchen Minor application of wound vac  08/18/2015    Procedure: MINOR APPLICATION OF WOUND VAC;  Surgeon: Tarry Kos, MD;  [facility]: [facility];  Service: Orthopedics;;  . Irrigation and debridement knee Left 08/20/2015    Procedure: IRRIGATION AND DEBRIDEMENT KNEE WITH PLACEMENT OF INTEGRA;  Surgeon: Tarry Kos, MD;  [facility];  Service: Orthopedics;  Laterality: Left;  . I&d extremity Left 09/10/2015    Procedure: LEFT KNEE SPLIT THICKNESS SKIN GRAFT, IRRIGATION AND DEBRIDEMENT, WOUND VAC;  Surgeon: Tarry Kos, MD;  [facility];  Service: Orthopedics;  Laterality: Left;  . Skin split graft Left 09/10/2015    Procedure: SKIN GRAFT SPLIT THICKNESS;  Surgeon: Tarry Kos, MD;  [facility];  Service: Orthopedics;  Laterality: Left;  . Application of wound vac Left 09/10/2015    Procedure: APPLICATION OF WOUND VAC;  Surgeon: Tarry Kos, MD;  [facility];  Service: Orthopedics;  Laterality: Left;    Family History  Problem Relation Age of Onset  . Hypertension Mother   . Diabetes Mother   . Hypertension Father     Social History:  reports that she has  never smoked. She has never used smokeless tobacco. She reports that she does not drink alcohol or use illicit drugs.  Allergies: No Known Allergies  Medications: I have reviewed the patient's current medications.     ROS Blood pressure 117/39, pulse 67, temperature 98.2 F (36.8 C), temperature source Axillary, resp. rate 18, height  (1.778 m), weight 252 lb (114.306 kg), SpO2 100 %. Physical Exam Reviewed intraoperative pictures: absence skin and subcutaneous tissue just distal to remaining patella, patellar tendon visible in caudal wound, defect  noted by Dr [name] to be 3-4 cm in tendon. Base of wound not visualized, no cellulits  Assessment/Plan: Open left knee joint, defect in patellar tendon.  Discussed with patient and husband that given above history, unlikely to regain all strength and motion of left knee. She is is risk needing AKA. Discussed with patient that there is now a defect in tendon and cannot be brought together again. Not able to use prosthetic material to bridge gap given soft tissue defect. Also has history of recent infection that would preclude this.   Offered medial gastrocnemius flap to coverage of wound. Counseled would have drain in calf, posterior calf scar. This would be used to cover defect, would need skin graft for coverage. Counseled she would not be able to bend knee from this surgery alone for 4-6 weeks. Counseled this will not solve problem of instability knee, would still require further surgery. May have to remain NWB for months.  Both patient and husband had many questions regarding why tendon cannot be fixed and felt wound looked good prior to surgery. Counseled it appears these were findings noted intraoperatively. They both stated they did not really understand what the purpose of flap surgery. I will return in few days to try again.   At this point, if wound clean, and patient unable to decide on how to proceed, could discharge with [facility] and can plan elective surgery.   Glenna Fellows, MD [facility] [phone]

## 2015-09-11 NOTE — Progress Notes (Signed)
Orthopedic Tech Progress Note Patient Details:  Lindsay Cobb [birth year] [id number] Brace order completed by [facility] Patient ID: Lindsay Cobb, female   DOB: October 12, 1941, 74 y.o.   MRN: [id number]   Lindsay Cobb 09/11/2015, 3:15 PM

## 2015-09-11 NOTE — Care Management (Signed)
Utilization review completed. Erving Sassano, RN Case Manager [phone]. 

## 2015-09-11 NOTE — Evaluation (Signed)
Occupational Therapy Evaluation & Discharge Patient Details Name: Lindsay Cobb MRN: [id number] DOB: 12-07-1940 Today's Date: 09/11/2015    History of Present Illness 74 y.o. female with L patellar fx sustained in a fall. She underwent partial patellectomy 07-11-15.  Returning for infection of L knee and thigh, with irrigation and debridement to excise necrotic tissue with L knee NWB and in immobilizer with no ROM.  Wound vac in place L thigh.      Clinical Impression   Pt admitted to hospital due to reason stated above. Pt currently with functional limitiations due to the deficits listed below (see OT problem list). Prior to admission pt was at [facility] SNF receiving assistance for ADLs. Pt currently requires set up to total assistance with ADLs. Deferring pt back to SNF for further treatment.    Follow Up Recommendations  SNF    Equipment Recommendations  Other (comment) TBD next venue   Recommendations for Other Services       Precautions / Restrictions Precautions Precautions: Fall Precaution Comments: Lt knee wound Required Braces or Orthoses: Other Brace/Splint Knee Immobilizer - Left: On at all times Other Brace/Splint: Locked bledsoe brace-LLE (has not arrived) Pt with KI on at present Restrictions Weight Bearing Restrictions: Yes LLE Weight Bearing: Non weight bearing      Mobility Bed Mobility Overal bed mobility: Needs Assistance Bed Mobility: Supine to Sit     Supine to sit: +2 for physical assistance;Min assist;HOB elevated     General bed mobility comments: Use of rail with HOB elevated.  Transfers Overall transfer level: Needs assistance Equipment used: Rolling walker (2 wheeled) Transfers: Sit to/from Stand Sit to Stand: +2 physical assistance;Mod assist         General transfer comment: Continous verbal cues for hand placement and required constant encouragement due to fear of falling. Pt stood x 2 reps and on 2nd rep, bed was moved and recliner  brought behind pt.  Unable to attempt any steps due to fear and NWB L LE.    Balance Overall balance assessment: Needs assistance Sitting-balance support: No upper extremity supported;Feet supported Sitting balance-Leahy Scale: Fair   Postural control: Posterior lean Standing balance support: Bilateral upper extremity supported;During functional activity Standing balance-Leahy Scale: Zero Standing balance comment: Pt requires +2 mod assist for standing and bil UE from rolling walker, fearful of falling                            ADL Overall ADL's : Needs assistance/impaired Eating/Feeding: Set up;Sitting   Grooming: Set up;Sitting   Upper Body Bathing: Set up;Sitting   Lower Body Bathing: Maximal assistance;Sit to/from stand (with +2 mod A sit<>stand) Lower Body Bathing Details (indicate cue type and reason): Pt only able to reach just below her knees when sitting. Pt very anxious with standing requiring +33mod assist and verbal cues for hand placement and hand placement Upper Body Dressing : Set up;Sitting   Lower Body Dressing: Maximal assistance;Sit to/from stand (with +2 mod assist sit<>stand) Lower Body Dressing Details (indicate cue type and reason): Pt requires +2 mod assist to stand, seemed very fearful of falling stood for ~15-20seconds before requesting to sit down Toilet Transfer: +2 for physical assistance;Moderate assistance;Stand-pivot   Toileting- Clothing Manipulation and Hygiene: Total assistance (with +2 mod assist sit<>stand)         General ADL Comments: Pt fearful of falling limiting ability to perform ADLs     Vision  Perception     Praxis      Pertinent Vitals/Pain Pain Assessment: 0-10 Pain Score: 4  Pain Location: L leg Pain Descriptors / Indicators: Operative site guarding Pain Intervention(s): Monitored during session;Repositioned     Hand Dominance Right   Extremity/Trunk Assessment Upper Extremity Assessment Upper  Extremity Assessment: Defer to OT evaluation   Lower Extremity Assessment Lower Extremity Assessment: LLE deficits/detail LLE Deficits / Details: KI intact with orders for Bledsoe brace which has not arrived LLE: Unable to fully assess due to immobilization   Cervical / Trunk Assessment Cervical / Trunk Assessment: Normal   Communication Communication Communication: No difficulties;HOH   Cognition Arousal/Alertness: Awake/alert Behavior During Therapy: WFL for tasks assessed/performed Overall Cognitive Status: History of cognitive impairments - at baseline                     General Comments       Exercises       Shoulder Instructions      Home Living Family/patient expects to be discharged to:: Skilled nursing facility                                        Prior Functioning/Environment Level of Independence: Independent             OT Diagnosis: Generalized weakness;Acute pain   OT Problem List: Decreased strength;Decreased range of motion;Impaired balance (sitting and/or standing);Pain;Obesity;Decreased knowledge of use of DME or AE   OT Treatment/Interventions:      OT Goals(Current goals can be found in the care plan section) Acute Rehab OT Goals Patient Stated Goal: to rehab then home  OT Frequency:     Barriers to D/C:            Co-evaluation PT/OT/SLP Co-Evaluation/Treatment: Yes Reason for Co-Treatment: Complexity of the patient's impairments (multi-system involvement);For patient/therapist safety PT goals addressed during session: Balance;Mobility/safety with mobility OT goals addressed during session: ADL's and self-care      End of Session Equipment Utilized During Treatment: Gait belt;Rolling walker Nurse Communication: Mobility status (Pt stands well, however pivoting is hard thus move furniture)  Activity Tolerance: Patient tolerated treatment well Patient left: in chair;with call bell/phone within reach;with  family/visitor present   Time: 1610-9604 OT Time Calculation (min): 40 min Charges:  OT General Charges $OT Visit: 1 Procedure OT Evaluation $Initial OT Evaluation Tier I: 1 Procedure OT Treatments $Self Care/Home Management : 8-22 mins G-Codes:    Smiley Houseman 10/01/15, 12:22 PM

## 2015-09-11 NOTE — H&P (Signed)
H&P update  The surgical history has been reviewed and remains accurate without interval change.  The patient was re-examined and patient's physiologic condition has not changed significantly in the last 30 days. The condition still exists that makes this procedure necessary. The treatment plan remains the same, without new options for care.  No new pharmacological allergies or types of therapy has been initiated that would change the plan or the appropriateness of the plan.  The patient and/or family understand the potential benefits and risks.  Mayra Reel, MD 09/11/2015 5:10 PM

## 2015-09-11 NOTE — Evaluation (Signed)
Physical Therapy Evaluation Patient Details Name: Lindsay Cobb MRN: [id number] DOB: 01/15/1941 Today's Date: 09/11/2015   History of Present Illness  74 y.o. female with L patellar fx sustained in a fall. She underwent partial patellectomy 07-11-15.  Returning for infection of L knee and thigh, with irrigation and debridement to excise necrotic tissue with L knee NWB and in immobilizer with no ROM.  Wound vac in place L thigh.     Clinical Impression  Pt admitted with above diagnosis. Pt currently with functional limitations due to the deficits listed below (see PT Problem List).  Pt will benefit from skilled PT to increase their independence and safety with mobility to allow discharge to the venue listed below.  Recommend returning to SNF.     Follow Up Recommendations SNF    Equipment Recommendations  None recommended by PT    Recommendations for Other Services       Precautions / Restrictions Precautions Precautions: Fall Precaution Comments: Lt knee wound Required Braces or Orthoses: Other Brace/Splint Knee Immobilizer - Left: On at all times Other Brace/Splint: Locked bledsoe brace-LLE (has not arrived) Pt with KI on at present Restrictions Weight Bearing Restrictions: Yes LLE Weight Bearing: Non weight bearing      Mobility  Bed Mobility Overal bed mobility: Needs Assistance Bed Mobility: Supine to Sit     Supine to sit: +2 for physical assistance;Min assist;HOB elevated     General bed mobility comments: Use of rail with HOB elevated.  Transfers Overall transfer level: Needs assistance Equipment used: Rolling walker (2 wheeled) Transfers: Sit to/from Stand Sit to Stand: +2 physical assistance;Mod assist         General transfer comment: Continous verbal cues for hand placement and required constant encouragement due to fear of falling. Pt stood x 2 reps and on 2nd rep, bed was moved and recliner brought behind pt.  Unable to attempt any steps due to fear  and NWB L LE.  Ambulation/Gait                Stairs            Wheelchair Mobility    Modified Rankin (Stroke Patients Only)       Balance Overall balance assessment: Needs assistance Sitting-balance support: Feet supported Sitting balance-Leahy Scale: Fair   Postural control: Posterior lean Standing balance support: Bilateral upper extremity supported Standing balance-Leahy Scale: Zero Standing balance comment: Requires RW and +2 A due to NWB                             Pertinent Vitals/Pain Pain Assessment: 0-10 Pain Score: 4  Pain Location: L leg Pain Descriptors / Indicators: Operative site guarding Pain Intervention(s): Monitored during session;Repositioned    Home Living Family/patient expects to be discharged to:: Skilled nursing facility                      Prior Function Level of Independence: Needs assistance   Gait / Transfers Assistance Needed: A with transfers at SNF due to NWB           Hand Dominance   Dominant Hand: Right    Extremity/Trunk Assessment   Upper Extremity Assessment: Defer to OT evaluation           Lower Extremity Assessment: LLE deficits/detail;Generalized weakness   LLE Deficits / Details: KI intact with orders for Bledsoe brace which has not arrived  Cervical /  Trunk Assessment: Normal  Communication   Communication: No difficulties;HOH  Cognition Arousal/Alertness: Awake/alert Behavior During Therapy: WFL for tasks assessed/performed Overall Cognitive Status: History of cognitive impairments - at baseline                      General Comments General comments (skin integrity, edema, etc.): Pt requires constant encouragement for sit to stand due to fear of falling. Pt required verbal cues for hand placement.    Exercises        Assessment/Plan    PT Assessment Patient needs continued PT services  PT Diagnosis Generalized weakness   PT Problem List Decreased  strength;Decreased activity tolerance;Decreased balance;Decreased mobility;Decreased knowledge of precautions  PT Treatment Interventions DME instruction;Functional mobility training;Therapeutic activities;Therapeutic exercise;Balance training   PT Goals (Current goals can be found in the Care Plan section) Acute Rehab PT Goals Patient Stated Goal: to rehab then home PT Goal Formulation: With patient/family Time For Goal Achievement: 09/25/15 Potential to Achieve Goals: Good    Frequency Min 3X/week   Barriers to discharge        Co-evaluation PT/OT/SLP Co-Evaluation/Treatment: Yes Reason for Co-Treatment: Complexity of the patient's impairments (multi-system involvement);For patient/therapist safety PT goals addressed during session: Balance;Mobility/safety with mobility OT goals addressed during session: ADL's and self-care       End of Session Equipment Utilized During Treatment: Gait belt;Oxygen;Left knee immobilizer Activity Tolerance: Patient tolerated treatment well Patient left: in chair;with call bell/phone within reach;with family/visitor present Nurse Communication: Mobility status         Time: 1036-1100 PT Time Calculation (min) (ACUTE ONLY): 24 min   Charges:   PT Evaluation $Initial PT Evaluation Tier I: 1 Procedure PT Treatments $Therapeutic Activity: 8-22 mins   PT G Codes:        Lindsay Cobb 09/11/2015, 12:26 PM

## 2015-09-11 NOTE — Progress Notes (Signed)
   Subjective:  Patient reports pain as mild.  Objective:   VITALS:   Filed Vitals:   09/10/15 1645 09/10/15 2145 09/11/15 0203 09/11/15 0527  BP: 135/56 125/39 120/46 111/53  Pulse: 65 62 64 65  Temp: 98.6 F (37 C) 98.4 F (36.9 C) 98.6 F (37 C) 98.4 F (36.9 C)  TempSrc: Oral Oral Axillary Axillary  Resp: Height:      Weight:      SpO2: 97% 95% 92% 90%    KI and VAC in place Patient is not completely understanding the gravity of the situation Mild dementia   Lab Results  Component Value Date   WBC 6.8 09/10/2015   HGB 8.8* 09/10/2015   HCT 26.7* 09/10/2015   MCV 80.7 09/10/2015   PLT 263 09/10/2015     Assessment/Plan:  1 Day Post-Op   - Expected postop acute blood loss anemia - will monitor for symptoms - Up with PT/OT - DVT ppx - SCDs, ambulation - NWB operative extremity - continue VAC - back to OR tomorrow for repeat I&D - she has significant patellar tendon defect  Cheral Almas 09/11/2015, 7:51 AM [phone]

## 2015-09-11 NOTE — Progress Notes (Signed)
Orthopedic Tech Progress Note Patient Details:  Lindsay Cobb [birth year] [id number]  Patient ID: [name], female   DOB: 04-Mar-1941, 74 y.o.   MRN: [id number] Called in advanced brace order; spoke with Ferdinand Lango, [name] 09/11/2015, 9:49 AM

## 2015-09-12 ENCOUNTER — Inpatient Hospital Stay (HOSPITAL_COMMUNITY): Payer: Commercial Managed Care - HMO | Admitting: Anesthesiology

## 2015-09-12 ENCOUNTER — Encounter (HOSPITAL_COMMUNITY)
Admission: RE | Disposition: A | Discharge status: Skilled Nursing Facility | Payer: Medicare Other | Source: Ambulatory Visit | Attending: Orthopaedic Surgery

## 2015-09-12 ENCOUNTER — Encounter (HOSPITAL_COMMUNITY): Payer: Self-pay | Admitting: Certified Registered Nurse Anesthetist

## 2015-09-12 ENCOUNTER — Telehealth: Payer: Self-pay | Admitting: *Deleted

## 2015-09-12 HISTORY — PX: I & D EXTREMITY: SHX5045

## 2015-09-12 LAB — BASIC METABOLIC PANEL
ANION GAP: 9 (ref 5–15)
BUN: 11 mg/dL (ref 6–20)
CALCIUM: 8.6 mg/dL — AB (ref 8.9–10.3)
CO2: 25 mmol/L (ref 22–32)
Chloride: 99 mmol/L — ABNORMAL LOW (ref 101–111)
Creatinine, Ser: 1.09 mg/dL — ABNORMAL HIGH (ref 0.44–1.00)
GFR calc non Af Amer: 49 mL/min — ABNORMAL LOW (ref >60)
GFR, EST AFRICAN AMERICAN: 57 mL/min — AB (ref >60)
Glucose, Bld: 126 mg/dL — ABNORMAL HIGH (ref 65–99)
Potassium: 4.3 mmol/L (ref 3.5–5.1)
Sodium: 133 mmol/L — ABNORMAL LOW (ref 135–145)

## 2015-09-12 LAB — GLUCOSE, CAPILLARY
GLUCOSE-CAPILLARY: 137 mg/dL — AB (ref 65–99)
GLUCOSE-CAPILLARY: 143 mg/dL — AB (ref 65–99)
GLUCOSE-CAPILLARY: 145 mg/dL — AB (ref 65–99)
GLUCOSE-CAPILLARY: 146 mg/dL — AB (ref 65–99)
Glucose-Capillary: 151 mg/dL — ABNORMAL HIGH (ref 65–99)
Glucose-Capillary: 128 mg/dL — ABNORMAL HIGH (ref 65–99)

## 2015-09-12 SURGERY — IRRIGATION AND DEBRIDEMENT EXTREMITY
Anesthesia: General | Site: Knee | Laterality: Left

## 2015-09-12 MED ORDER — LIDOCAINE HCL (CARDIAC) 20 MG/ML IV SOLN
INTRAVENOUS | Status: AC
  Filled 2015-09-12: qty 5

## 2015-09-12 MED ORDER — PROPOFOL 10 MG/ML IV BOLUS
INTRAVENOUS | Status: DC | PRN
  Administered 2015-09-12: 150 mg via INTRAVENOUS

## 2015-09-12 MED ORDER — SODIUM CHLORIDE 0.9 % IR SOLN
Status: DC | PRN
  Administered 2015-09-12: 3000 mL

## 2015-09-12 MED ORDER — CEFAZOLIN SODIUM-DEXTROSE 2-3 GM-% IV SOLR
INTRAVENOUS | Status: DC | PRN
  Administered 2015-09-12: 2 g via INTRAVENOUS

## 2015-09-12 MED ORDER — FENTANYL CITRATE (PF) 100 MCG/2ML IJ SOLN
INTRAMUSCULAR | Status: DC | PRN
  Administered 2015-09-12: 25 ug via INTRAVENOUS
  Administered 2015-09-12: 75 ug via INTRAVENOUS

## 2015-09-12 MED ORDER — CEFAZOLIN SODIUM-DEXTROSE 2-3 GM-% IV SOLR
INTRAVENOUS | Status: AC
  Filled 2015-09-12: qty 50

## 2015-09-12 MED ORDER — PHENYLEPHRINE HCL 10 MG/ML IJ SOLN
INTRAMUSCULAR | Status: DC | PRN
  Administered 2015-09-12: 80 ug via INTRAVENOUS

## 2015-09-12 MED ORDER — ASPIRIN EC 325 MG PO TBEC
325.0000 mg | DELAYED_RELEASE_TABLET | Freq: Every day | ORAL | Status: DC
Start: 2015-09-12 — End: 2015-09-16
  Administered 2015-09-12 – 2015-09-16 (×5): 325 mg via ORAL
  Filled 2015-09-12 (×5): qty 1

## 2015-09-12 MED ORDER — 0.9 % SODIUM CHLORIDE (POUR BTL) OPTIME
TOPICAL | Status: DC | PRN
  Administered 2015-09-12: 1000 mL

## 2015-09-12 MED ORDER — ONDANSETRON HCL 4 MG/2ML IJ SOLN
INTRAMUSCULAR | Status: AC
  Filled 2015-09-12: qty 2

## 2015-09-12 MED ORDER — PROPOFOL 10 MG/ML IV BOLUS
INTRAVENOUS | Status: AC
  Filled 2015-09-12: qty 20

## 2015-09-12 MED ORDER — LIDOCAINE HCL (CARDIAC) 20 MG/ML IV SOLN
INTRAVENOUS | Status: DC | PRN
  Administered 2015-09-12: 80 mg via INTRAVENOUS

## 2015-09-12 MED ORDER — FENTANYL CITRATE (PF) 250 MCG/5ML IJ SOLN
INTRAMUSCULAR | Status: AC
  Filled 2015-09-12: qty 5

## 2015-09-12 MED ORDER — PHENYLEPHRINE 40 MCG/ML (10ML) SYRINGE FOR IV PUSH (FOR BLOOD PRESSURE SUPPORT)
PREFILLED_SYRINGE | INTRAVENOUS | Status: AC
  Filled 2015-09-12: qty 10

## 2015-09-12 SURGICAL SUPPLY — 61 items
BANDAGE ELASTIC 3 VELCRO ST LF (GAUZE/BANDAGES/DRESSINGS) IMPLANT
BLADE SURG 10 STRL SS (BLADE) ×3 IMPLANT
BNDG COHESIVE 1X5 TAN STRL LF (GAUZE/BANDAGES/DRESSINGS) IMPLANT
BNDG COHESIVE 4X5 TAN STRL (GAUZE/BANDAGES/DRESSINGS) ×3 IMPLANT
BNDG COHESIVE 6X5 TAN STRL LF (GAUZE/BANDAGES/DRESSINGS) ×6 IMPLANT
BNDG CONFORM 3 STRL LF (GAUZE/BANDAGES/DRESSINGS) IMPLANT
BNDG GAUZE STRTCH 6 (GAUZE/BANDAGES/DRESSINGS) ×9 IMPLANT
CANISTER WOUND CARE 500ML ATS (WOUND CARE) ×2 IMPLANT
CORDS BIPOLAR (ELECTRODE) IMPLANT
COVER SURGICAL LIGHT HANDLE (MISCELLANEOUS) ×3 IMPLANT
CUFF TOURNIQUET SINGLE 24IN (TOURNIQUET CUFF) IMPLANT
CUFF TOURNIQUET SINGLE 34IN LL (TOURNIQUET CUFF) ×2 IMPLANT
CUFF TOURNIQUET SINGLE 44IN (TOURNIQUET CUFF) IMPLANT
DRAPE EXTREMITY BILATERAL (DRAPE) IMPLANT
DRAPE IMP U-DRAPE 54X76 (DRAPES) IMPLANT
DRAPE INCISE IOBAN 66X45 STRL (DRAPES) ×12 IMPLANT
DRAPE SURG 17X23 STRL (DRAPES) IMPLANT
DRAPE U-SHAPE 47X51 STRL (DRAPES) ×3 IMPLANT
DRSG VAC ATS SM SENSATRAC (GAUZE/BANDAGES/DRESSINGS) ×2 IMPLANT
DURAPREP 26ML APPLICATOR (WOUND CARE) ×3 IMPLANT
ELECT CAUTERY BLADE 6.4 (BLADE) ×3 IMPLANT
ELECT REM PT RETURN 9FT ADLT (ELECTROSURGICAL)
ELECTRODE REM PT RTRN 9FT ADLT (ELECTROSURGICAL) IMPLANT
FACESHIELD WRAPAROUND (MASK) IMPLANT
FACESHIELD WRAPAROUND OR TEAM (MASK) IMPLANT
GAUZE SPONGE 4X4 12PLY STRL (GAUZE/BANDAGES/DRESSINGS) ×2 IMPLANT
GAUZE XEROFORM 1X8 LF (GAUZE/BANDAGES/DRESSINGS) ×3 IMPLANT
GAUZE XEROFORM 5X9 LF (GAUZE/BANDAGES/DRESSINGS) ×3 IMPLANT
GLOVE NEODERM STRL 7.5 LF PF (GLOVE) ×2 IMPLANT
GLOVE SURG NEODERM 7.5  LF PF (GLOVE) ×4
GOWN STRL REIN XL XLG (GOWN DISPOSABLE) ×6 IMPLANT
HANDPIECE INTERPULSE COAX TIP (DISPOSABLE) ×3
KIT BASIN OR (CUSTOM PROCEDURE TRAY) ×3 IMPLANT
KIT ROOM TURNOVER OR (KITS) ×3 IMPLANT
MANIFOLD NEPTUNE II (INSTRUMENTS) ×3 IMPLANT
NS IRRIG 1000ML POUR BTL (IV SOLUTION) ×6 IMPLANT
PACK ORTHO EXTREMITY (CUSTOM PROCEDURE TRAY) ×3 IMPLANT
PAD ABD 8X10 STRL (GAUZE/BANDAGES/DRESSINGS) ×3 IMPLANT
PAD ARMBOARD 7.5X6 YLW CONV (MISCELLANEOUS) ×6 IMPLANT
PADDING CAST ABS 4INX4YD NS (CAST SUPPLIES) ×4
PADDING CAST ABS COTTON 4X4 ST (CAST SUPPLIES) ×2 IMPLANT
PADDING CAST COTTON 6X4 STRL (CAST SUPPLIES) ×3 IMPLANT
SET HNDPC FAN SPRY TIP SCT (DISPOSABLE) IMPLANT
SPONGE LAP 18X18 X RAY DECT (DISPOSABLE) ×6 IMPLANT
STOCKINETTE IMPERVIOUS 9X36 MD (GAUZE/BANDAGES/DRESSINGS) ×3 IMPLANT
SUT ETHILON 2 0 FS 18 (SUTURE) ×3 IMPLANT
SUT ETHILON 2 0 PSLX (SUTURE) ×1 IMPLANT
SUT ETHILON 3 0 PS 1 (SUTURE) ×2 IMPLANT
SUT VIC AB 2-0 CT1 36 (SUTURE) ×1 IMPLANT
SUT VIC AB 2-0 FS1 27 (SUTURE) ×2 IMPLANT
SYR CONTROL 10ML LL (SYRINGE) IMPLANT
TOWEL OR 17X24 6PK STRL BLUE (TOWEL DISPOSABLE) ×3 IMPLANT
TOWEL OR 17X26 10 PK STRL BLUE (TOWEL DISPOSABLE) ×3 IMPLANT
TUBE ANAEROBIC SPECIMEN COL (MISCELLANEOUS) IMPLANT
TUBE CONNECTING 12'X1/4 (SUCTIONS) ×1
TUBE CONNECTING 12X1/4 (SUCTIONS) ×2 IMPLANT
TUBE FEEDING 5FR 15 INCH (TUBING) IMPLANT
TUBING CYSTO DISP (UROLOGICAL SUPPLIES) ×1 IMPLANT
UNDERPAD 30X30 INCONTINENT (UNDERPADS AND DIAPERS) ×2 IMPLANT
WATER STERILE IRR 1000ML POUR (IV SOLUTION) ×1 IMPLANT
YANKAUER SUCT BULB TIP NO VENT (SUCTIONS) ×3 IMPLANT

## 2015-09-12 NOTE — Progress Notes (Addendum)
I had a long discussion with the patient and husband at bedside in the presence of her RN, Selena Batten.  I stressed the importance of wound coverage by a gastroc flap performed by a plastic surgeon in order to prevent deep infection.  I used layman's terms in order to clearly convey the message.  They refused plastic surgery intervention.  We therefore talked about the alternative which is allowing the wound to healing by secondary intention and granulation with every other day VAC changes.  I told them that this was not a good option but the best one available besides gastroc flap.  They understand that by deciding to refuse plastic surgery and going forth with VAC changes, they are at extremely high risk of deep knee joint infection and osteomyelitis and potential for sepsis and loss of limb.  At this time point, we will have CM/SW assist [place] with getting her transferred back to her facility to undergo VAC changes every MWF.  She is to follow up with me in the office in 1-2 weeks for a wound check.  She's currently on 6 weeks of vanc.  We will keep her through the weekend and discharge back to facility on Monday  N. Glee Arvin, MD [facility] [phone] 5:17 PM

## 2015-09-12 NOTE — Clinical Social Work Note (Signed)
Clinical Social Worker has assessed patient and pt's family at bedside. Full psychosocial assessment to follow.  Derenda Fennel, MSW, LCSWA [phone] 09/12/2015 2:45 PM

## 2015-09-12 NOTE — Progress Notes (Signed)
Dr [name] had a conversation with Mr & Mrs Gordin regarding a Engineer, petroleum for Mrs Bango left leg, she refused the plastic surgeon and stated she is hoping it will heal itself and she did not want the plastic surgeon. Knowing if it did not heal she could loose her leg but Mr and Mrs Ledlow still stated no Engineer, petroleum.  I am a witness of this conversation. They both stated they understood Dr [name] conversation regarding her left leg.

## 2015-09-12 NOTE — Anesthesia Procedure Notes (Signed)
Procedure Name: LMA Insertion Date/Time: 09/12/2015 7:33 AM Performed by: Patrcia Dolly, Brandilyn Nanninga Pre-anesthesia Checklist: Patient identified, Patient being monitored, Timeout performed, Emergency Drugs available and Suction available Patient Re-evaluated:Patient Re-evaluated prior to inductionOxygen Delivery Method: Circle System Utilized Preoxygenation: Pre-oxygenation with 100% oxygen Intubation Type: IV induction Ventilation: Mask ventilation without difficulty LMA Size: 4.0 Number of attempts: 1 Placement Confirmation: positive ETCO2 and breath sounds checked- equal and bilateral Tube secured with: Tape Dental Injury: Teeth and Oropharynx as per pre-operative assessment

## 2015-09-12 NOTE — Telephone Encounter (Signed)
Received fax from [facility] care mgmt [facility] stating that this patient has been admitted to acute care hospital  Hospital: [facility]   Admit Date: 09/10/15  Dx:M25.562- pain in left knee  Admitting physician: Glee Arvin  Pending authorization:[id number]

## 2015-09-12 NOTE — Op Note (Signed)
   Date of surgery: 09/12/2015  Preoperative diagnosis: Left knee wound 5 x 5 cm  Postoperative diagnosis: Same  Procedure:  1. Irrigation and debridement of muscle, tendon, skin 5 x 5 cm 2. Application of wound VAC less than 50 cm  Surgeon: Glee Arvin, M.D.   Anesthesia: Gen.  Estimated blood loss: Minimal  Condition to PACU: Stable  Indications for procedure: Lindsay Cobb returns today for serial I&D of her knee wound. She was aware the risks benefits alternatives to surgery and she wished to proceed. Consent was obtained.  Description of procedure: Patient was identified in the preoperative holding area. She was brought back to the operating room. She was placed supine on table. Gen. anesthesia was administered. Left lower extremity was prepped and draped in standard sterile fashion. Timeout was performed. Preoperative and backed given. The wound was examined again. We performed sharp excisional debridement of her patellar tendon, subcutaneous tissue, skin with a rongeur and knife. There was good bleeding of the tissue. The wound did track all the way down into the knee joint. After debridement I irrigated the wound with pulse lavage with 3 L of normal saline. The wound VAC was then placed back on the wound with Adaptic. This was turned to -75 mmHg. The patient tolerated the procedure well was extubated and transferred to the PACU in stable condition.   Postoperative plan: Patient will remain nonweightbearing to the left lower extremity. She will remain in her locked Bledsoe brace. Plastic surgery will see her this weekend for further discussion of a muscular flap.  Lindsay Reel, MD [facility] [phone] 8:10 AM

## 2015-09-12 NOTE — Transfer of Care (Signed)
Immediate Anesthesia Transfer of Care Note  Patient: Lindsay Cobb  Procedure(s) Performed: Procedure(s): IRRIGATION AND DEBRIDEMENT LEFT KNEE WITH PLACEMENT OF WOUND VAC (Left)  Patient Location: PACU  Anesthesia Type:General  Level of Consciousness: sedated, patient cooperative and responds to stimulation  Airway & Oxygen Therapy: Patient Spontanous Breathing and Patient connected to face mask oxygen  Post-op Assessment: Report given to RN, Post -op Vital signs reviewed and stable, Patient moving all extremities and Patient moving all extremities X 4  Post vital signs: Reviewed and stable  Last Vitals:  Filed Vitals:   09/12/15 0610  BP: 113/42  Pulse: 74  Temp: 36.7 C  Resp: 18    Complications: No apparent anesthesia complications

## 2015-09-12 NOTE — Anesthesia Preprocedure Evaluation (Signed)
Anesthesia Evaluation  Patient identified by MRN, date of birth, ID band Patient awake    History of Anesthesia Complications Negative for: history of anesthetic complications  Airway Mallampati: II   Neck ROM: Full    Dental  (+) Edentulous Upper, Edentulous Lower   Pulmonary neg pulmonary ROS,    breath sounds clear to auscultation       Cardiovascular hypertension, Pt. on medications  Rhythm:Regular Rate:Normal     Neuro/Psych negative neurological ROS     GI/Hepatic   Endo/Other  diabetes, Type 2  Renal/GU      Musculoskeletal  (+) Arthritis ,   Abdominal   Peds  Hematology  (+) anemia ,   Anesthesia Other Findings   Reproductive/Obstetrics                             Anesthesia Physical Anesthesia Plan  ASA: III  Anesthesia Plan: General LMA   Post-op Pain Management:    Induction: Intravenous  Airway Management Planned:   Additional Equipment:   Intra-op Plan:   Post-operative Plan: Extubation in OR  Informed Consent: I have reviewed the patients History and Physical, chart, labs and discussed the procedure including the risks, benefits and alternatives for the proposed anesthesia with the patient or authorized representative who has indicated his/her understanding and acceptance.     Plan Discussed with: CRNA and Surgeon  Anesthesia Plan Comments:         Anesthesia Quick Evaluation

## 2015-09-12 NOTE — Anesthesia Postprocedure Evaluation (Signed)
  Anesthesia Post-op Note  Patient: Lindsay Cobb  Procedure(s) Performed: Procedure(s): IRRIGATION AND DEBRIDEMENT LEFT KNEE WITH PLACEMENT OF WOUND VAC (Left)  Patient Location: PACU  Anesthesia Type: General LMA   Level of Consciousness: awake, alert  and oriented  Airway and Oxygen Therapy: Patient Spontanous Breathing  Post-op Pain: mild  Post-op Assessment: Post-op Vital signs reviewed  Post-op Vital Signs: Reviewed  Last Vitals:  Filed Vitals:   09/12/15 2041  BP: 103/37  Pulse: 74  Temp: 37.1 C  Resp: 16    Complications: No apparent anesthesia complications

## 2015-09-12 NOTE — Progress Notes (Signed)
   Plastic Surgery Follow Up  Patient post repeat wash out this, VAC change. Feeling well, husband at bedside.  Reviewed defect in tendon, open wound. Reviewed purpose of muscle flap for coverage of wound, tendon; provide soft tissue for any further repairs.   They stated that they wanted to keep trying to fix the tendon. I counseled this may mean something like muscle flap surgery to allow fixing of tendon. At this time the patient and husband state they do not want any muscles cut or moved.  Difficult situation, alternatives to allow wound to granulate, place skin substitutes, all would be poor options for future tendon reconstruction.  Patient has my contact info, will be available as needed if patient agrees to procedure.  Glenna Fellows, MD [facility] [phone]

## 2015-09-13 LAB — BASIC METABOLIC PANEL
ANION GAP: 8 (ref 5–15)
BUN: 10 mg/dL (ref 6–20)
CHLORIDE: 101 mmol/L (ref 101–111)
CO2: 25 mmol/L (ref 22–32)
Calcium: 8.5 mg/dL — ABNORMAL LOW (ref 8.9–10.3)
Creatinine, Ser: 1 mg/dL (ref 0.44–1.00)
GFR calc non Af Amer: 55 mL/min — ABNORMAL LOW (ref >60)
GLUCOSE: 156 mg/dL — AB (ref 65–99)
Potassium: 4.1 mmol/L (ref 3.5–5.1)
Sodium: 134 mmol/L — ABNORMAL LOW (ref 135–145)

## 2015-09-13 LAB — GLUCOSE, CAPILLARY
GLUCOSE-CAPILLARY: 118 mg/dL — AB (ref 65–99)
Glucose-Capillary: 96 mg/dL (ref 65–99)
Glucose-Capillary: 162 mg/dL — ABNORMAL HIGH (ref 65–99)

## 2015-09-13 LAB — VANCOMYCIN, TROUGH: Vancomycin Tr: 27 ug/mL (ref 10.0–20.0)

## 2015-09-13 MED ORDER — SODIUM CHLORIDE 0.9 % IJ SOLN
10.0000 mL | INTRAMUSCULAR | Status: DC | PRN
Start: 2015-09-13 — End: 2015-09-16
  Administered 2015-09-16 (×2): 10 mL
  Filled 2015-09-13 (×2): qty 40

## 2015-09-13 MED ORDER — VANCOMYCIN HCL IN DEXTROSE 750-5 MG/150ML-% IV SOLN
750.0000 mg | Freq: Two times a day (BID) | INTRAVENOUS | Status: DC
  Administered 2015-09-13 – 2015-09-16 (×6): 750 mg via INTRAVENOUS
  Filled 2015-09-13 (×8): qty 150

## 2015-09-13 MED ORDER — SODIUM CHLORIDE 0.9 % IJ SOLN: 10.0000 mL | INTRAMUSCULAR | Status: DC | PRN

## 2015-09-13 MED ORDER — SODIUM CHLORIDE 0.9 % IJ SOLN
10.0000 mL | Freq: Two times a day (BID) | INTRAMUSCULAR | Status: DC
  Administered 2015-09-16: 10 mL

## 2015-09-13 NOTE — Progress Notes (Addendum)
Patient accidentally pulled out PICC line. MD paged (left message with secretary at [phone]) regarding new order for PICC. Awaiting for call back.   Sim Boast, RN

## 2015-09-13 NOTE — Clinical Social Work Placement (Signed)
   CLINICAL SOCIAL WORK PLACEMENT  NOTE  Date:  09/13/2015  Patient Details  Name: Lindsay Cobb MRN: [id number] Date of Birth: 11-16-1941  Clinical Social Work is seeking post-discharge placement for this patient at the Skilled  Nursing Facility level of care (*CSW will initial, date and re-position this form in  chart as items are completed):  Yes   Patient/family provided with [facility] Clinical Social Work Department's list of facilities offering this level of care within the geographic area requested by the patient (or if unable, by the patient's family).  Yes   Patient/family informed of their freedom to choose among providers that offer the needed level of care, that participate in Medicare, Medicaid or managed care program needed by the patient, have an available bed and are willing to accept the patient.  Yes   Patient/family informed of [facility]'s ownership interest in [facility] and [facility], as well as of the fact that they are under no obligation to receive care at these facilities.  PASRR submitted to EDS on       PASRR number received on       Existing PASRR number confirmed on 09/12/15     FL2 transmitted to all facilities in geographic area requested by pt/family on 09/12/15     FL2 transmitted to all facilities within larger geographic area on       Patient informed that his/her managed care company has contracts with or will negotiate with certain facilities, including the following:            Patient/family informed of bed offers received.  Patient chooses bed at       Physician recommends and patient chooses bed at      Patient to be transferred to   on  .  Patient to be transferred to facility by       Patient family notified on   of transfer.  Name of family member notified:        PHYSICIAN Please sign FL2     Additional Comment:    _______________________________________________ Derenda Fennel, MSW, LCSWA [phone] 09/13/2015 10:18 AM

## 2015-09-13 NOTE — Progress Notes (Signed)
Peripherally Inserted Central Catheter/Midline Placement  The IV Nurse has discussed with the patient and/or persons authorized to consent for the patient, the purpose of this procedure and the potential benefits and risks involved with this procedure.  The benefits include less needle sticks, lab draws from the catheter and patient may be discharged home with the catheter.  Risks include, but not limited to, infection, bleeding, blood clot (thrombus formation), and puncture of an artery; nerve damage and irregular heat beat.  Alternatives to this procedure were also discussed.  PICC/Midline Placement Documentation  PICC / Midline Single Lumen 09/13/15 PICC Right Brachial 41 cm 0 cm (Active)  Indication for Insertion or Continuance of Line Home intravenous therapies (PICC only) 09/13/2015  3:25 PM  Exposed Catheter (cm) 0 cm 09/13/2015  3:25 PM  Site Assessment Clean;Dry;Intact 09/13/2015  3:25 PM  Line Status Flushed;Saline locked;Blood return noted 09/13/2015  3:25 PM  Dressing Type Transparent 09/13/2015  3:25 PM  Dressing Change Due 09/20/15 09/13/2015  3:25 PM       Ethelda Chick 09/13/2015, 3:27 PM

## 2015-09-13 NOTE — Progress Notes (Addendum)
Subjective: 1 Day Post-Op Procedure(s) (LRB): IRRIGATION AND DEBRIDEMENT LEFT KNEE WITH PLACEMENT OF WOUND VAC (Left) Patient reports pain as mild.    Objective: Vital signs in last 24 hours: Temp:  [98.2 F (36.8 C)-99.7 F (37.6 C)] 98.2 F (36.8 C) (10/08 0917) Pulse Rate:  [68-81] 73 (10/08 0917) Resp:  [16-18] 18 (10/08 0917) BP: (103-125)/(37-52) 120/51 mmHg (10/08 0917) SpO2:  [90 %-98 %] 93 % (10/08 0917)  Intake/Output from previous day: 10/07 0701 - 10/08 0700 In: 840 [P.O.:240; I.V.:600] Out: 500 [Urine:500] Intake/Output this shift:     Recent Labs  09/10/15 1158  HGB 8.8*    Recent Labs  09/10/15 1158  WBC 6.8  RBC 3.31*  HCT 26.7*  PLT 263    Recent Labs  09/12/15 0449 09/13/15 0505  NA 133* 134*  K 4.3 4.1  CL 99* 101  CO2 25 25  BUN 11 10  CREATININE 1.09* 1.00  GLUCOSE 126* 156*  CALCIUM 8.6* 8.5*   No results for input(s): LABPT, INR in the last 72 hours.  Neurologically intact  Assessment/Plan: 1 Day Post-Op Procedure(s) (LRB): IRRIGATION AND DEBRIDEMENT LEFT KNEE WITH PLACEMENT OF WOUND VAC (Left) Up with therapy Continue ABX therapy due to Post-op infection  Jashae Wiggs C 09/13/2015, 9:32 AM Vanc reduced to  due to elevated Vanc level. Recheck in a few days. Creatinine good.

## 2015-09-13 NOTE — Clinical Social Work Note (Signed)
Clinical Social Work Assessment  Patient Details  Name: Lindsay Cobb MRN: [id number] Date of Birth: Oct 22, 1941  Date of referral:  09/12/15               Reason for consult:  Discharge Planning                Permission sought to share information with:  Family Supports, Customer service manager, Case Optician, dispensing granted to share information::  Yes, Verbal Permission Granted  Name::      Lindsay Cobb)  Agency::   (SNF's )  Relationship::   (Spouse )  Contact Information:   [phone])  Housing/Transportation Living arrangements for the past 2 months:  [place], [place] of Information:  Patient Patient Interpreter Needed:  None Criminal Activity/Legal Involvement Pertinent to Current Situation/Hospitalization:  No - Comment as needed Significant Relationships:  Spouse Lives with:  Spouse Do you feel safe going back to the place where you live?  Yes Need for family participation in patient care:  No (Coment)  Care giving concerns:  Patient requiring continued skilled nursing care.   Social Worker assessment / plan:  CSW met with patient and husband at bedside in reference to post-acute placement for SNF. CSW introduced CSW role and SNF process. CSW also reviewed and provided SNF list. Pt is familiar with SNF process as she was admitted from [facility] however does NOT wish to return to that location. Pt stated she is interested in [place] area. CSW has completed FL-2 and faxed clinicals to [facility] area. No further concerns reported at this time. CSW remains available.  Employment status:  Retired Nurse, adult PT Recommendations:  [place] / Referral to community resources:  [place]  Patient/Family's Response to care:  Pt alert and oriented and at bedside with husband. Pt and husband agreeable to SNF placement in [facility]. Pt's  husband supportive and involved in care. Pt and family pleasant and appreciated social work intervention.  Patient/Family's Understanding of and Emotional Response to Diagnosis, Current Treatment, and Prognosis:  Pt and family understanding and knowledgeable of surgical procedure.   Emotional Assessment Appearance:  Appears stated age Attitude/Demeanor/Rapport:   (Pleasant ) Affect (typically observed):  Accepting, Pleasant, Hopeful Orientation:  Oriented to Self, Oriented to Place, Oriented to  Time, Oriented to Situation Alcohol / Substance use:  Not Applicable Psych involvement (Current and /or in the community):  No (Comment)  Discharge Needs  Concerns to be addressed:  Care Coordination Readmission within the last 30 days:  No Current discharge risk:  Dependent with Mobility Barriers to Discharge:  Continued Medical Work up   [employer], MSW, LCSWA [phone] 09/13/2015 10:17 AM

## 2015-09-13 NOTE — Care Management Important Message (Signed)
Important Message  Patient Details  Name: Lindsay Cobb MRN: [id number] Date of Birth: 1941/08/30   Medicare Important Message Given:  Yes-second notification given    Isaias Cowman, RN 09/13/2015, 10:31 AM

## 2015-09-13 NOTE — Progress Notes (Signed)
Lab called and informed this RN about patient's critical lab value of Vanco trough @ 27. Pharmacy then called and instructed this RN to hold the patient's morning dose of Vanco until they notify the physician about the critical lab value result.

## 2015-09-14 LAB — GLUCOSE, CAPILLARY
GLUCOSE-CAPILLARY: 108 mg/dL — AB (ref 65–99)
GLUCOSE-CAPILLARY: 212 mg/dL — AB (ref 65–99)
GLUCOSE-CAPILLARY: 109 mg/dL — AB (ref 65–99)
Glucose-Capillary: 138 mg/dL — ABNORMAL HIGH (ref 65–99)

## 2015-09-14 MED ORDER — ALUM & MAG HYDROXIDE-SIMETH 200-200-20 MG/5ML PO SUSP: 30.0000 mL | ORAL | Status: DC | PRN

## 2015-09-14 NOTE — Progress Notes (Signed)
Physical Therapy Treatment Patient Details Name: Lindsay Cobb MRN: [id number] DOB: May 01, 1941 Today's Date: 09/14/2015    History of Present Illness 74 y.o. female with L patellar fx sustained in a fall. She underwent partial patellectomy 07-11-15.  Returning for infection of L knee and thigh, with irrigation and debridement to excise necrotic tissue with L knee NWB and in immobilizer with no ROM.  Wound vac in place L thigh. Additonal I&D 09/12/15.    PT Comments    Slow progress. Required max assist to stand and pivot to chair. Requires assist to maintain NWB through LLE with Bledsoe in place. Eager to work with therapy and requests transferring to [facility] in [place] at discharge. Of note, patient states that she declined plastic surgeon consult due to fear of payment and would be willing to consider this option if she could afford the procedure. RN notified. Patient will continue to benefit from skilled physical therapy services to further improve independence with functional mobility.   Follow Up Recommendations  SNF     Equipment Recommendations  None recommended by PT    Recommendations for Other Services       Precautions / Restrictions Precautions Precautions: Fall Required Braces or Orthoses: Other Brace/Splint Knee Immobilizer - Left: On at all times Other Brace/Splint: Locked bledsoe brace-LLE  Restrictions Weight Bearing Restrictions: Yes LLE Weight Bearing: Non weight bearing    Mobility  Bed Mobility Overal bed mobility: Needs Assistance Bed Mobility: Supine to Sit     Supine to sit: Min assist;HOB elevated     General bed mobility comments: Min assist for LLE support and for patient to pull through PTs hand. HOB elevated and used rail.  Transfers Overall transfer level: Needs assistance Equipment used: Rolling walker (2 wheeled) Transfers: Sit to/from [employer] Sit to Stand: Max assist;From elevated surface Stand pivot  transfers: Max assist       General transfer comment: Max assist for boost to stand, VC for hand placement and to keep LLE NWB. Difficulty with pivot and fatigued quickly after standing. Attempted to sit prematurely into chair. Cues throughout for safety and technique.  Ambulation/Gait                 Stairs            Wheelchair Mobility    Modified Rankin (Stroke Patients Only)       Balance                                    Cognition Arousal/Alertness: Awake/alert Behavior During Therapy: WFL for tasks assessed/performed Overall Cognitive Status: History of cognitive impairments - at baseline       Memory: Decreased recall of precautions              Exercises General Exercises - Lower Extremity Ankle Circles/Pumps: AROM;Both;20 reps;Seated    General Comments General comments (skin integrity, edema, etc.): Eager to work with therapy. Wants to transfer to new SNF and continue with aggressive rehab. During my interview she reports that she is most worried about paying for procedures. States that she declined Engineer, petroleum consult due to fear of payment.      Pertinent Vitals/Pain Pain Assessment: 0-10 Pain Score:  ("I'm doing okay right now" No numerical value given) Pain Location: Lt knee Pain Descriptors / Indicators: Grimacing (with movement.) Pain Intervention(s): Monitored during session;Repositioned    Home Living  Prior Function            PT Goals (current goals can now be found in the care plan section) Acute Rehab PT Goals Patient Stated Goal: to rehab then home PT Goal Formulation: With patient/family Time For Goal Achievement: 09/25/15 Potential to Achieve Goals: Good Progress towards PT goals: Progressing toward goals    Frequency  Min 3X/week    PT Plan Current plan remains appropriate    Co-evaluation             End of Session Equipment Utilized During Treatment:  Gait belt;Left knee immobilizer Activity Tolerance: Patient limited by fatigue Patient left: in chair;with call bell/phone within reach;with SCD's reapplied     Time: 1610-9604 PT Time Calculation (min) (ACUTE ONLY): 27 min  Charges:  $Therapeutic Activity: 23-37 mins                    G CodesBerton Mount 10-11-15, 4:27 PM [place], [place] [phone]

## 2015-09-14 NOTE — Progress Notes (Signed)
Pt communicated to physical therapist that she would have foot surgery if she would get help from her insurance. Currently pt thinks that if she was to have surgery, she would have to pay out of pocket

## 2015-09-14 NOTE — Progress Notes (Signed)
Prn zofran administered for c/o nausea 

## 2015-09-14 NOTE — Progress Notes (Signed)
Subjective: 2 Days Post-Op Procedure(s) (LRB): IRRIGATION AND DEBRIDEMENT LEFT KNEE WITH PLACEMENT OF WOUND VAC (Left) Patient reports pain as mild.    Objective: Vital signs in last 24 hours: Temp:  [98.2 F (36.8 C)-98.7 F (37.1 C)] 98.7 F (37.1 C) (10/08 1959) Pulse Rate:  [73-75] 75 (10/08 1959) Resp:  [18] 18 (10/08 1959) BP: (120-123)/(51-64) 123/64 mmHg (10/08 1959) SpO2:  [93 %-95 %] 95 % (10/08 1959)  Intake/Output from previous day: 10/08 0701 - 10/09 0700 In: 480 [P.O.:480] Out: 300 [Urine:300] Intake/Output this shift:    No results for input(s): HGB in the last 72 hours. No results for input(s): WBC, RBC, HCT, PLT in the last 72 hours.  Recent Labs  09/12/15 0449 09/13/15 0505  NA 133* 134*  K 4.3 4.1  CL 99* 101  CO2 25 25  BUN 11 10  CREATININE 1.09* 1.00  GLUCOSE 126* 156*  CALCIUM 8.6* 8.5*   No results for input(s): LABPT, INR in the last 72 hours.  Neurologically intact  Assessment/Plan: 2 Days Post-Op Procedure(s) (LRB): IRRIGATION AND DEBRIDEMENT LEFT KNEE WITH PLACEMENT OF WOUND VAC (Left) Up with therapy  NWB with brace on.   YATES,MARK C 09/14/2015, 7:33 AM

## 2015-09-14 NOTE — Progress Notes (Signed)
Pt c/o feeling bloated. No prn meds ordered for gas. MD paged

## 2015-09-14 NOTE — Care Management Note (Signed)
Case Management Note  Patient Details  Name: Lindsay Cobb MRN: [id number] Date of Birth: 04/05/1941  Subjective/Objective:                  IRRIGATION AND DEBRIDEMENT LEFT KNEE WITH PLACEMENT OF WOUND VAC (Left)  Action/Plan: CM notes that SW consulted due to need for SNF. Per note, pt agreeable. CM remains available for any further discharge planning needs.   Expected Discharge Date:                  Expected Discharge Plan:  Skilled Nursing Facility  In-House Referral:  Clinical Social Work  Discharge planning Services  CM Consult  Post Acute Care Choice:    Choice offered to:     DME Arranged:    DME Agency:     HH Arranged:    HH Agency:     Status of Service:  In process, will continue to follow  Medicare Important Message Given:   Date Medicare IM Given:    Medicare IM give by:    Date Additional Medicare IM Given:    Additional Medicare Important Message give by:     If discussed at Long Length of Stay Meetings, dates discussed:    Additional Comments:  Darcel Smalling, RN 09/14/2015, 10:02 AM

## 2015-09-15 ENCOUNTER — Encounter (HOSPITAL_COMMUNITY): Payer: Self-pay | Admitting: Orthopaedic Surgery

## 2015-09-15 LAB — GLUCOSE, CAPILLARY
GLUCOSE-CAPILLARY: 149 mg/dL — AB (ref 65–99)
GLUCOSE-CAPILLARY: 132 mg/dL — AB (ref 65–99)
Glucose-Capillary: 80 mg/dL (ref 65–99)
Glucose-Capillary: 126 mg/dL — ABNORMAL HIGH (ref 65–99)

## 2015-09-15 NOTE — Progress Notes (Signed)
   Plastic Surgery Follow Up  Asked to see patient again. Patient and husband present. Patient states that after she spoke with Dr. [name] this am, she has changed her mind and does not want any plastic surgery.   Reviewed defect in tendon, open wound. I counseled patient that Dr. [name] would not have consulted Plastic Surgery if he thought she would do well with some other procedure or option. Counseled allowing wound to heal secondarily is poor option for future ambulation, preventing infection. She had questions if this is common procedure and counseled it is a common procedure for wounds of knee, complications from knee infection or prosthetic. Cannot assure her that she will not have problems with healing, but feel it is worth the risk for her current situation.  They would like to think about it more. I let them know I will be out of town from  10/14-10/22/16. Patient given my contact info again and recommend follow up in clinic in 2 weeks to see if she has made any decisions.   Glenna Fellows, MD [facility] Plastic & Reconstructive Surgery [phone]

## 2015-09-15 NOTE — Discharge Summary (Addendum)
Physician Discharge Summary      Patient ID: Lindsay Cobb MRN: [id number] DOB/AGE: 1941-11-16 74 y.o.  Admit date: 09/10/2015 Discharge date: 09/16/2015  Admission Diagnoses:  Left knee wound  Discharge Diagnoses:  Active Problems:   Open knee wound   Past Medical History  Diagnosis Date  . Diabetes mellitus     takes Januvia,Metformin,and Glipizide daily  . Hyperlipidemia     takes Lovastatin nightly  . Anemia     takes Ferrous Gluconate daily  . Peripheral edema     takes Furosemide daily as needed  . Depression     takes Tofranil daily  . Hypertension     takes Amlodipine,Atenolol,and Benazepril daily  . Arthritis   . Joint pain   . Joint swelling   . Cataracts, bilateral     immature  . Infection of skin of knee     left    Surgeries: Procedure(s): IRRIGATION AND DEBRIDEMENT LEFT KNEE WITH PLACEMENT OF WOUND VAC on 09/10/2015 - 09/12/2015   Consultants (if any):    Discharged Condition: Improved  Hospital Course: Lindsay Cobb is an 74 y.o. female who was admitted 09/10/2015 with a diagnosis of <principal problem not specified> and went to the operating room on 09/10/2015 - 09/12/2015 and underwent the above named procedures.    She was given perioperative antibiotics:      Anti-infectives    Start     Dose/Rate Route Frequency Ordered Stop   09/13/15 1800  vancomycin (VANCOCIN) IVPB 750 mg/150 ml premix     750 mg 150 mL/hr over 60 Minutes Intravenous Every 12 hours 09/13/15 0800     09/10/15 1800  vancomycin (VANCOCIN) IVPB 1000 mg/200 mL premix  Status:  Discontinued     1,000 mg 200 mL/hr over 60 Minutes Intravenous Every 12 hours 09/10/15 1702 09/13/15 0800   09/10/15 1300  ceFAZolin (ANCEF) IVPB 2 g/50 mL premix     2 g 100 mL/hr over 30 Minutes Intravenous  Once 09/10/15 1259 09/10/15 1329    .  She was given sequential compression devices, early ambulation, and aspirin for DVT prophylaxis.  She benefited maximally from the hospital stay  and there were no complications.    Recent vital signs:  Filed Vitals:   09/16/15 0503  BP: 138/58  Pulse: 72  Temp: 98.2 F (36.8 C)  Resp: 18    Recent laboratory studies:  Lab Results  Component Value Date   HGB 8.8* 09/10/2015   HGB 9.2* 08/16/2015   HGB 9.5* 08/14/2015   Lab Results  Component Value Date   WBC 6.8 09/10/2015   PLT 263 09/10/2015   No results found for: INR Lab Results  Component Value Date   NA 134* 09/13/2015   K 4.1 09/13/2015   CL 101 09/13/2015   CO2 25 09/13/2015   BUN 10 09/13/2015   CREATININE 1.00 09/13/2015   GLUCOSE 156* 09/13/2015    Discharge Medications:     Medication List    ASK your doctor about these medications        amLODipine 10 MG tablet  Commonly known as:  NORVASC  TAKE 1 TABLET EVERY DAY     aspirin EC 325 MG tablet  Take 1 tablet (325 mg total) by mouth daily.     atenolol 50 MG tablet  Commonly known as:  TENORMIN  Take 1 tablet (50 mg total) by mouth daily.     benazepril 20 MG tablet  Commonly known as:  LOTENSIN  TAKE 1 TABLET EVERY DAY     CITRACAL PO  Take 2 tablets by mouth daily.     ferrous sulfate 325 (65 FE) MG tablet  Take 325 mg by mouth daily with breakfast.     furosemide 20 MG tablet  Commonly known as:  LASIX  Take 1-2 tablets once a day as needed for leg swelling     glipiZIDE 10 MG tablet  Commonly known as:  GLUCOTROL  TAKE 1 TABLET TWICE DAILY BEFORE A MEAL     glucose blood test strip  Commonly known as:  ACCU-CHEK AVIVA  Use to monitor FSBS 1x daily. Dx: E11.9     imipramine 50 MG tablet  Commonly known as:  TOFRANIL  Take 1 tablet (50 mg total) by mouth 3 (three) times daily.     JANUVIA 25 MG tablet  Generic drug:  sitaGLIPtin  TAKE ONE TABLET BY MOUTH ONCE DAILY     lovastatin 10 MG tablet  Commonly known as:  MEVACOR  Take 1 tablet (10 mg total) by mouth at bedtime.     metFORMIN 1000 MG tablet  Commonly known as:  GLUCOPHAGE  TAKE 1 TABLET TWICE DAILY  WITH A MEAL     oxyCODONE 5 MG immediate release tablet  Commonly known as:  Oxy IR/ROXICODONE  Take 1-3 tablets (5-15 mg total) by mouth every 4 (four) hours as needed.     vancomycin 1 GM/200ML Soln  Commonly known as:  VANCOCIN  Inject 200 mLs (1,000 mg total) into the vein every 12 (twelve) hours.        Diagnostic Studies: No results found.  Disposition: 03-Skilled Nursing Facility   She will need VAC changes every Monday, Wednesday, Friday until otherwise notified by orthopedic surgeon.    Follow-up Information    Follow up with [facility], BRINDA, MD. Schedule an appointment as soon as possible for a visit in 2 weeks.   Specialty:  Plastic Surgery   Contact information:   [address] [place] [address] [phone]       Follow up with Cheral Almas, MD In 2 weeks.   Specialty:  Orthopedic Surgery   Why:  For wound re-check   Contact information:   [address] [place] [address] [phone]        Signed: Cheral Almas 09/16/2015, 6:51 AM

## 2015-09-15 NOTE — Clinical Social Work Note (Signed)
CSW spoke with patient and her husband, and patient decided she would like to go to [facility] in [place] for short term rehab.  CSW contacted SNF and they said they can take patient on Tuesday morning.  CSW to continue to follow patient's progress.  Ervin Knack. Francis Doenges, MSW, Theresia Majors [phone] 09/15/2015 5:39 PM

## 2015-09-15 NOTE — Progress Notes (Signed)
   Subjective:  Patient is stable  Objective:   VITALS:   Filed Vitals:   09/13/15 1959 09/14/15 1245 09/14/15 2017 09/15/15 0445  BP: 123/64 157/52 125/44 115/97  Pulse: 75 73 61 64  Temp: 98.7 F (37.1 C) 98.8 F (37.1 C) 98.6 F (37 C) 98.4 F (36.9 C)  TempSrc: Oral Oral Oral Oral  Resp: Height:      Weight:      SpO2: 95% 64% 97% 97%    Neurovascular intact Sensation intact distally Intact pulses distally Dorsiflexion/Plantar flexion intact Incision: dressing C/D/I and no drainage No cellulitis present Compartment soft   Lab Results  Component Value Date   WBC 6.8 09/10/2015   HGB 8.8* 09/10/2015   HCT 26.7* 09/10/2015   MCV 80.7 09/10/2015   PLT 263 09/10/2015     Assessment/Plan:  3 Days Post-Op   - Expected postop acute blood loss anemia - will monitor for symptoms - Up with PT/OT - DVT ppx - SCDs, ambulation, aspirin - NWB operative extremity - patient and husband wish to discuss gastroc flap again with Dr. Leta Baptist.  She has agreed to see her again today. - discharge pending decision to proceed with gastroc flap or not  Cheral Almas 09/15/2015, 8:25 AM [phone]

## 2015-09-16 ENCOUNTER — Inpatient Hospital Stay
Admission: RE | Admit: 2015-09-16 | Discharge: 2015-09-21 | Disposition: A | Discharge status: Other Acute Inpatient Hospital | Payer: Commercial Managed Care - HMO | Source: Ambulatory Visit | Attending: Internal Medicine | Admitting: Internal Medicine

## 2015-09-16 LAB — GLUCOSE, CAPILLARY
GLUCOSE-CAPILLARY: 114 mg/dL — AB (ref 65–99)
Glucose-Capillary: 148 mg/dL — ABNORMAL HIGH (ref 65–99)

## 2015-09-16 LAB — VANCOMYCIN, TROUGH: VANCOMYCIN TR: 19 ug/mL (ref 10.0–20.0)

## 2015-09-16 MED ORDER — HEPARIN SOD (PORK) LOCK FLUSH 100 UNIT/ML IV SOLN
250.0000 [IU] | INTRAVENOUS | Status: AC | PRN
  Administered 2015-09-16: 250 [IU]

## 2015-09-16 NOTE — Clinical Social Work Note (Signed)
CSW met with patient and informed her that [facility] has a bed available which is the SNF that she wanted.  Patient expressed that she was very excited that she is able to go to facility that she wants.  CSW contacted SNF to confirm they can take patient today which they said they can.  Patient agreed to go to SNF for short term rehab, patient to be d/c'ed today to [facility].  Patient and family agreeable to plans will transport via ems RN to call report.  Evette Cristal, MSW, [place]

## 2015-09-16 NOTE — Progress Notes (Signed)
Patient stable for dc to SNF today. Will f/u in 2 weeks with me and plastic surgery.

## 2015-09-16 NOTE — Care Management Important Message (Signed)
Important Message  Patient Details  Name: Lindsay Cobb MRN: [id number] Date of Birth: 1941/03/20   Medicare Important Message Given:  Yes-third notification given    Orson Aloe 09/16/2015, 11:21 AM

## 2015-09-17 ENCOUNTER — Encounter: Payer: Self-pay | Admitting: *Deleted

## 2015-09-17 ENCOUNTER — Non-Acute Institutional Stay (SKILLED_NURSING_FACILITY): Payer: Commercial Managed Care - HMO | Admitting: Internal Medicine

## 2015-09-17 ENCOUNTER — Other Ambulatory Visit: Payer: Self-pay

## 2015-09-17 ENCOUNTER — Other Ambulatory Visit: Payer: Self-pay | Admitting: *Deleted

## 2015-09-17 DIAGNOSIS — T8189XA Other complications of procedures, not elsewhere classified, initial encounter: Secondary | ICD-10-CM

## 2015-09-17 DIAGNOSIS — E0821 Diabetes mellitus due to underlying condition with diabetic nephropathy: Secondary | ICD-10-CM | POA: Diagnosis not present

## 2015-09-17 DIAGNOSIS — A4902 Methicillin resistant Staphylococcus aureus infection, unspecified site: Secondary | ICD-10-CM | POA: Diagnosis not present

## 2015-09-17 DIAGNOSIS — I10 Essential (primary) hypertension: Secondary | ICD-10-CM | POA: Diagnosis not present

## 2015-09-17 MED ORDER — OXYCODONE HCL 5 MG PO TABS: 5.0000 mg | ORAL_TABLET | ORAL | Status: DC | PRN

## 2015-09-17 NOTE — Progress Notes (Signed)
Patient ID: Lindsay Cobb, female   DOB: 03-25-1941, 74 y.o.   MRN: [id number]    Facility; [facility] Chief complaint; admission to SNF post admit to [facility] from 10/5 to 09/16/2015  History; this is a 74 year old woman who fell in July of this year suffering a patellar fracture. This was repaired surgically. She is readmitted in September with a wound dehiscence and clear indication of wound infection. She has been back to the OR on several occasions for debridement. 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 Patient was seen by plastic surgery during this hospitalization and was offered a flap closure of the wound however she declined this surgery for reasons that are not totally clear. She is a type II diabetic on oral agents with her last hemoglobin A1c on 08/17/15 at 7.3.  she arrives here with an order for a wound VAC to the left knee   BMP Latest Ref Rng 09/13/2015 09/12/2015 09/11/2015  Glucose 65 - 99 mg/dL 621(H) 086(V) 784(O)  BUN 6 - 20 mg/dL Creatinine 0.44 - 1.00 mg/dL 9.62 9.52(W) 4.13(K)  Sodium 135 - 145 mmol/L 134(L) 133(L) 133(L)  Potassium 3.5 - 5.1 mmol/L 4.1 4.3 4.4  Chloride 101 - 111 mmol/L 101 99(L) 101  CO2 22 - 32 mmol/L Calcium 8.9 - 10.3 mg/dL 4.4(W) 1.0(U) 7.2(Z)   CBC Latest Ref Rng 09/10/2015 08/16/2015 08/14/2015  WBC 4.0 - 10.5 K/uL 6.8 10.2 11.6(H)  Hemoglobin 12.0 - 15.0 g/dL 3.6(U) 4.4(I) 3.4(V)  Hematocrit 36.0 - 46.0 % 26.7(L) 27.7(L) 27.7(L)  Platelets 150 - 400 K/uL 263 406(H) 339      Past Medical History  Diagnosis Date  . Diabetes mellitus     takes Januvia,Metformin,and Glipizide daily  . Hyperlipidemia     takes Lovastatin nightly  . Anemia     takes Ferrous Gluconate daily  . Peripheral edema     takes Furosemide daily as needed  . Depression     takes Tofranil daily  . Hypertension     takes Amlodipine,Atenolol,and Benazepril daily  . Arthritis   . Joint pain   . Joint  swelling   . Cataracts, bilateral     immature  . Infection of skin of knee     left   Past Surgical History  Procedure Laterality Date  . Breast biopsy Left   . Colonoscopy    . Esophagogastroduodenoscopy    . Ercp    . Patellectomy Left 07/11/2015    Procedure: Left Partial PATELLECTOMY;  Surgeon: Tarry Kos, MD;  [facility];  Service: Orthopedics;  Laterality: Left;  . Irrigation and debridement knee Left 08/15/2015    Procedure: IRRIGATION AND DEBRIDEMENT LEFT KNEE;  Surgeon: Tarry Kos, MD;  [facility]: [facility];  Service: Orthopedics;  Laterality: Left;  . Patellectomy Left 08/15/2015    Procedure: Revision PATELLECTOMY;  Surgeon: Tarry Kos, MD;  [facility]: [facility];  Service: Orthopedics;  Laterality: Left;  . I&d extremity Left 08/18/2015    Procedure: IRRIGATION AND DEBRIDEMENT EXTREMITY;  Surgeon: Tarry Kos, MD;  Location: [facility];  Service: Orthopedics;  Laterality: Left;  Marland Kitchen Minor application of wound vac  08/18/2015    Procedure: MINOR APPLICATION OF WOUND VAC;  Surgeon: Tarry Kos, MD;  [facility]: [facility];  Service: Orthopedics;;  . Irrigation and debridement knee Left 08/20/2015    Procedure: IRRIGATION AND DEBRIDEMENT KNEE WITH PLACEMENT OF  INTEGRA;  Surgeon: Tarry KosNaiping M Xu, MD;  [facility];  Service: Orthopedics;  Laterality: Left;  . I&d extremity Left 09/10/2015    Procedure: LEFT KNEE SPLIT THICKNESS SKIN GRAFT, IRRIGATION AND DEBRIDEMENT, WOUND VAC;  Surgeon: Tarry KosNaiping M Xu, MD;  [facility]: [facility];  Service: Orthopedics;  Laterality: Left;  . Skin split graft Left 09/10/2015    Procedure: SKIN GRAFT SPLIT THICKNESS;  Surgeon: Tarry KosNaiping M Xu, MD;  [facility];  Service: Orthopedics;  Laterality: Left;  . Application of wound vac Left 09/10/2015    Procedure: APPLICATION OF WOUND VAC;  Surgeon: Tarry KosNaiping M Xu, MD;  [facility];  Service: Orthopedics;  Laterality: Left;  . I&d extremity Left 09/12/2015    Procedure: IRRIGATION AND DEBRIDEMENT LEFT KNEE WITH PLACEMENT OF WOUND  VAC;  Surgeon: Tarry KosNaiping M Xu, MD;  [facility];  Service: Orthopedics;  Laterality: Left;   Current Outpatient Prescriptions on File Prior to Visit  Medication Sig Dispense Refill  . amLODipine (NORVASC) 10 MG tablet TAKE 1 TABLET EVERY DAY 90 tablet 1  . aspirin EC 325 MG tablet Take 1 tablet (325 mg total) by mouth daily. 84 tablet 0  . atenolol (TENORMIN) 50 MG tablet Take 1 tablet (50 mg total) by mouth daily. 90 tablet 1  . benazepril (LOTENSIN) 20 MG tablet TAKE 1 TABLET EVERY DAY 90 tablet 1  . Calcium Citrate (CITRACAL PO) Take 2 tablets by mouth daily.     . ferrous sulfate 325 (65 FE) MG tablet Take 325 mg by mouth daily with breakfast.   0  . furosemide (LASIX) 20 MG tablet Take 1-2 tablets once a day as needed for leg swelling (Patient taking differently: Take 20-40 mg by mouth daily. Take 1 tablet (20 mg) by mouth daily, may take an additional tablet (20 mg) as needed for feet swelling) 180 tablet 1  . glipiZIDE (GLUCOTROL) 10 MG tablet TAKE 1 TABLET TWICE DAILY BEFORE A MEAL 180 tablet 1  . glucose blood (ACCU-CHEK AVIVA) test strip Use to monitor FSBS 1x daily. Dx: E11.9 50 each 6  . imipramine (TOFRANIL) 50 MG tablet Take 1 tablet (50 mg total) by mouth 3 (three) times daily. 270 tablet 1  . JANUVIA 25 MG tablet TAKE ONE TABLET BY MOUTH ONCE DAILY (Patient taking differently: TAKE ONE TABLET BY MOUTH ONCE DAILY WITH BREAKFAST) 30 tablet 3  . lovastatin (MEVACOR) 10 MG tablet Take 1 tablet (10 mg total) by mouth at bedtime. 90 tablet 2  . metFORMIN (GLUCOPHAGE) 1000 MG tablet TAKE 1 TABLET TWICE DAILY WITH A MEAL (Patient taking differently: Take 1,000 mg by mouth 2 (two) times daily with a meal. ) 180 tablet 1  . oxyCODONE (OXY IR/ROXICODONE) 5 MG immediate release tablet Take 1-3 tablets (5-15 mg total) by mouth every 4 (four) hours as needed. 180 tablet 0  . vancomycin (VANCOCIN) 1 GM/200ML SOLN Inject 200 mLs (1,000 mg total) into the vein every 12 (twelve) hours. 200 mL 41     Social; the patient lives in [place] with her husband in their own home. He was not using ambulatory assist devices. Before the fall and the patellar fracture in July she states she was independent with ADLs and I ADLs  reports that she has never smoked. She has never used smokeless tobacco. She reports that she does not drink alcohol or use illicit drugs.  Family History  Problem Relation Age of Onset  . Hypertension Mother   . Diabetes Mother   .  Hypertension Father     Review of systems Gen. patient does not describe a lot of pain fever chills HEENT partial dentures Respiratory no shortness of breath Cardiac no chest pain GI; normal formed bowel movement yesterday no abdominal pain noted no diarrhea GU no dysuria Musculoskeletal no other issues other than the left knee no particular pain Neurologic; does not describe neuropathy Vascular does not describe claudication Mental status; no obvious depression or depressive symptoms  Physical examination Gen. the patient looks remarkably stable. HEENT no oral lesions are seen Respiratory clear entry bilaterally Cardiac heart sounds are normal no murmurs no bruits she appears to be euvolemic Abdomen; slightly distended but soft bowel sounds are positive no liver no spleen GU no suprapubic or costovertebral angle tenderness Vascular; peripheral pulses are palpable at the dorsalis pedis. There is some evidence of venous stasis however her edema is controlled. Musculoskeletal; the only abnormality is in the left knee. There is swelling and warmth in the joint but no obvious effusion and not much tenderness. The wound itself looks surprisingly good given everything she's been through healthy granulation. No obvious periwound soft tissue infection. There are 2 small divots in the superior aspect of the wound but this does not probe to bone. Neurologic; the patient has mild weakness in the hip flexors and abductors at 4 out of 5  distally her strength is intact in the right knee her knee jerk is in tact. Both plantar responses downgoing. Mental status bright alert cognizant seems aware of her history. I did not see any abnormalities here Skin; on the left buttock is a fairly substantial unstageable pressure ulcer. This has an odor but no tenderness no crepitus. It has a high to adhere and eschar that is going to need debridement probably mechanically at some point to.  Impression/plan #1 surgical wound nonhealing with an underlying MRSA infection. Apparently the depth of this infection was at one point the felt to be down to the joint itself . While there is swelling and warmth in the knee some of this is to be expected. There was not a lot of pain. The wound looks really quite good. We'll continue the wound VAC for now. Continued follow-up with orthopedics Dr. [name]. She is nonweightbearing and wears a brace to keep the knee extended. #2 type 2 diabetes on 3 oral agents. Her hemoglobin A1c is satisfactory. #3 declining hemoglobin which I think is probably secondary to multiple surgeries. #4 unstageable wound over her left buttock. I'm concerned about this vis--vis the possibility of infection. Santyl-based dressings for now mechanical debridement could quite possibly be necessary. The extent of this is really unclear at this point there may be a component of deep tissue injury is well. No antibiotics in addition to the vancomycin for now but careful follow-up will be necessary. #5 hypertension this will need to be monitored while she is here on Norvasc, atenolol and benazepril. #6 on Tofranil 50 mg 3 times a day apparently for a history of depression. Will need to review this with her at a later date. Tricyclic antidepressants are often not well tolerated in older people. I am also not certain about a 3 time a day dose. Her depression does however appear controlled #7 hyperlipidemia on Mevacor.   I have left orders for the  buttock wound which will need careful attention over the next several days. I am not completely certain about the duration of vancomycin. The patient follows with orthopedics in 2 weeks it will definitely be  and told that appointment.

## 2015-09-17 NOTE — Telephone Encounter (Signed)
RX faxed to [facility] @ [phone]. Phone number [phone]  

## 2015-09-18 ENCOUNTER — Encounter (HOSPITAL_COMMUNITY)
Admission: RE | Admit: 2015-09-18 | Discharge: 2015-09-18 | Disposition: A | Discharge status: Home / Self Care | Payer: Commercial Managed Care - HMO | Source: Skilled Nursing Facility | Attending: Internal Medicine | Admitting: Internal Medicine

## 2015-09-18 LAB — CREATININE, SERUM
CREATININE: 0.88 mg/dL (ref 0.44–1.00)
GFR calc Af Amer: >60 mL/min (ref >60)

## 2015-09-18 LAB — VANCOMYCIN, TROUGH: VANCOMYCIN TR: 16 ug/mL (ref 10.0–20.0)

## 2015-09-18 LAB — BUN: BUN: 14 mg/dL (ref 6–20)

## 2015-09-19 ENCOUNTER — Encounter (HOSPITAL_COMMUNITY)
Admission: RE | Admit: 2015-09-19 | Discharge: 2015-09-19 | Disposition: A | Discharge status: Home / Self Care | Payer: Commercial Managed Care - HMO | Source: Skilled Nursing Facility | Attending: Internal Medicine | Admitting: Internal Medicine

## 2015-09-19 LAB — CREATININE, SERUM
Creatinine, Ser: 0.91 mg/dL (ref 0.44–1.00)
GFR calc Af Amer: >60 mL/min (ref >60)
GFR calc non Af Amer: >60 mL/min (ref >60)

## 2015-09-19 LAB — VANCOMYCIN, TROUGH: Vancomycin Tr: 19 ug/mL (ref 10.0–20.0)

## 2015-09-19 LAB — BUN: BUN: 16 mg/dL (ref 6–20)

## 2015-09-21 ENCOUNTER — Inpatient Hospital Stay (HOSPITAL_COMMUNITY)
Admission: EM | Admit: 2015-09-21 | Discharge: 2015-10-08 | DRG: 981 | Disposition: A | Discharge status: Skilled Nursing Facility | Payer: Commercial Managed Care - HMO | Attending: Internal Medicine | Admitting: Internal Medicine

## 2015-09-21 ENCOUNTER — Emergency Department (HOSPITAL_COMMUNITY): Payer: Commercial Managed Care - HMO

## 2015-09-21 ENCOUNTER — Encounter (HOSPITAL_COMMUNITY): Payer: Self-pay | Admitting: *Deleted

## 2015-09-21 DIAGNOSIS — M86261 Subacute osteomyelitis, right tibia and fibula: Secondary | ICD-10-CM | POA: Insufficient documentation

## 2015-09-21 DIAGNOSIS — E871 Hypo-osmolality and hyponatremia: Secondary | ICD-10-CM | POA: Diagnosis not present

## 2015-09-21 DIAGNOSIS — N179 Acute kidney failure, unspecified: Secondary | ICD-10-CM | POA: Insufficient documentation

## 2015-09-21 DIAGNOSIS — T8131XA Disruption of external operation (surgical) wound, not elsewhere classified, initial encounter: Secondary | ICD-10-CM | POA: Diagnosis present

## 2015-09-21 DIAGNOSIS — R7881 Bacteremia: Secondary | ICD-10-CM | POA: Insufficient documentation

## 2015-09-21 DIAGNOSIS — E119 Type 2 diabetes mellitus without complications: Secondary | ICD-10-CM

## 2015-09-21 DIAGNOSIS — M86651 Other chronic osteomyelitis, right thigh: Secondary | ICD-10-CM | POA: Insufficient documentation

## 2015-09-21 DIAGNOSIS — E785 Hyperlipidemia, unspecified: Secondary | ICD-10-CM | POA: Diagnosis present

## 2015-09-21 DIAGNOSIS — Z6841 Body Mass Index (BMI) 40.0 and over, adult: Secondary | ICD-10-CM

## 2015-09-21 DIAGNOSIS — J189 Pneumonia, unspecified organism: Principal | ICD-10-CM | POA: Diagnosis present

## 2015-09-21 DIAGNOSIS — E876 Hypokalemia: Secondary | ICD-10-CM | POA: Diagnosis not present

## 2015-09-21 DIAGNOSIS — M00869 Arthritis due to other bacteria, unspecified knee: Secondary | ICD-10-CM | POA: Diagnosis present

## 2015-09-21 DIAGNOSIS — G9340 Encephalopathy, unspecified: Secondary | ICD-10-CM | POA: Insufficient documentation

## 2015-09-21 DIAGNOSIS — B9562 Methicillin resistant Staphylococcus aureus infection as the cause of diseases classified elsewhere: Secondary | ICD-10-CM | POA: Diagnosis present

## 2015-09-21 DIAGNOSIS — Z7982 Long term (current) use of aspirin: Secondary | ICD-10-CM

## 2015-09-21 DIAGNOSIS — A4101 Sepsis due to Methicillin susceptible Staphylococcus aureus: Secondary | ICD-10-CM | POA: Insufficient documentation

## 2015-09-21 DIAGNOSIS — T8189XA Other complications of procedures, not elsewhere classified, initial encounter: Secondary | ICD-10-CM | POA: Diagnosis present

## 2015-09-21 DIAGNOSIS — E11628 Type 2 diabetes mellitus with other skin complications: Secondary | ICD-10-CM

## 2015-09-21 DIAGNOSIS — L89329 Pressure ulcer of left buttock, unspecified stage: Secondary | ICD-10-CM | POA: Diagnosis present

## 2015-09-21 DIAGNOSIS — E875 Hyperkalemia: Secondary | ICD-10-CM | POA: Insufficient documentation

## 2015-09-21 DIAGNOSIS — E669 Obesity, unspecified: Secondary | ICD-10-CM | POA: Diagnosis present

## 2015-09-21 DIAGNOSIS — G9341 Metabolic encephalopathy: Secondary | ICD-10-CM | POA: Diagnosis not present

## 2015-09-21 DIAGNOSIS — E46 Unspecified protein-calorie malnutrition: Secondary | ICD-10-CM | POA: Diagnosis present

## 2015-09-21 DIAGNOSIS — Y95 Nosocomial condition: Secondary | ICD-10-CM | POA: Diagnosis present

## 2015-09-21 DIAGNOSIS — R509 Fever, unspecified: Secondary | ICD-10-CM

## 2015-09-21 DIAGNOSIS — I10 Essential (primary) hypertension: Secondary | ICD-10-CM | POA: Diagnosis present

## 2015-09-21 DIAGNOSIS — L8994 Pressure ulcer of unspecified site, stage 4: Secondary | ICD-10-CM | POA: Diagnosis present

## 2015-09-21 DIAGNOSIS — B9561 Methicillin susceptible Staphylococcus aureus infection as the cause of diseases classified elsewhere: Secondary | ICD-10-CM | POA: Diagnosis present

## 2015-09-21 DIAGNOSIS — D638 Anemia in other chronic diseases classified elsewhere: Secondary | ICD-10-CM | POA: Diagnosis present

## 2015-09-21 DIAGNOSIS — I950 Idiopathic hypotension: Secondary | ICD-10-CM | POA: Insufficient documentation

## 2015-09-21 DIAGNOSIS — E8770 Fluid overload, unspecified: Secondary | ICD-10-CM | POA: Diagnosis not present

## 2015-09-21 DIAGNOSIS — R0602 Shortness of breath: Secondary | ICD-10-CM

## 2015-09-21 DIAGNOSIS — E1140 Type 2 diabetes mellitus with diabetic neuropathy, unspecified: Secondary | ICD-10-CM | POA: Diagnosis present

## 2015-09-21 DIAGNOSIS — D649 Anemia, unspecified: Secondary | ICD-10-CM | POA: Diagnosis present

## 2015-09-21 DIAGNOSIS — E1169 Type 2 diabetes mellitus with other specified complication: Secondary | ICD-10-CM | POA: Diagnosis present

## 2015-09-21 DIAGNOSIS — J9811 Atelectasis: Secondary | ICD-10-CM | POA: Diagnosis not present

## 2015-09-21 DIAGNOSIS — F329 Major depressive disorder, single episode, unspecified: Secondary | ICD-10-CM | POA: Diagnosis present

## 2015-09-21 LAB — BASIC METABOLIC PANEL
ANION GAP: 9 (ref 5–15)
BUN: 15 mg/dL (ref 6–20)
CHLORIDE: 101 mmol/L (ref 101–111)
CO2: 23 mmol/L (ref 22–32)
Calcium: 8.1 mg/dL — ABNORMAL LOW (ref 8.9–10.3)
Creatinine, Ser: 0.97 mg/dL (ref 0.44–1.00)
GFR, EST NON AFRICAN AMERICAN: 57 mL/min — AB (ref >60)
Glucose, Bld: 194 mg/dL — ABNORMAL HIGH (ref 65–99)
POTASSIUM: 2.9 mmol/L — AB (ref 3.5–5.1)
SODIUM: 133 mmol/L — AB (ref 135–145)

## 2015-09-21 LAB — URINALYSIS, ROUTINE W REFLEX MICROSCOPIC
Glucose, UA: 100 mg/dL — AB
LEUKOCYTES UA: NEGATIVE
Nitrite: NEGATIVE
PROTEIN: 30 mg/dL — AB
SPECIFIC GRAVITY, URINE: 1.025 (ref 1.005–1.030)
UROBILINOGEN UA: 1 mg/dL (ref 0.0–1.0)
pH: 6 (ref 5.0–8.0)

## 2015-09-21 LAB — CBC WITH DIFFERENTIAL/PLATELET
BASOS PCT: 1 %
Basophils Absolute: 0.1 10*3/uL (ref 0.0–0.1)
Eosinophils Absolute: 2.1 10*3/uL — ABNORMAL HIGH (ref 0.0–0.7)
Eosinophils Relative: 22 %
HEMATOCRIT: 25.4 % — AB (ref 36.0–46.0)
HEMOGLOBIN: 8.3 g/dL — AB (ref 12.0–15.0)
LYMPHS ABS: 0.9 10*3/uL (ref 0.7–4.0)
Lymphocytes Relative: 9 %
MCH: 25.5 pg — ABNORMAL LOW (ref 26.0–34.0)
MCHC: 32.7 g/dL (ref 30.0–36.0)
MCV: 78.2 fL (ref 78.0–100.0)
MONOS PCT: 9 %
Monocytes Absolute: 0.9 10*3/uL (ref 0.1–1.0)
NEUTROS ABS: 6 10*3/uL (ref 1.7–7.7)
NEUTROS PCT: 61 %
Platelets: 349 10*3/uL (ref 150–400)
RBC: 3.25 MIL/uL — AB (ref 3.87–5.11)
RDW: 15.7 % — ABNORMAL HIGH (ref 11.5–15.5)
WBC: 10 10*3/uL (ref 4.0–10.5)

## 2015-09-21 LAB — I-STAT CG4 LACTIC ACID, ED: LACTIC ACID, VENOUS: 1.23 mmol/L (ref 0.5–2.0)

## 2015-09-21 LAB — TROPONIN I

## 2015-09-21 LAB — URINE MICROSCOPIC-ADD ON

## 2015-09-21 MED ORDER — PIPERACILLIN-TAZOBACTAM 3.375 G IVPB 30 MIN
3.3750 g | Freq: Three times a day (TID) | INTRAVENOUS | Status: DC
  Administered 2015-09-21: 3.375 g via INTRAVENOUS
  Filled 2015-09-21 (×3): qty 50

## 2015-09-21 MED ORDER — HEPARIN SOD (PORK) LOCK FLUSH 100 UNIT/ML IV SOLN
INTRAVENOUS | Status: AC
  Filled 2015-09-21: qty 5

## 2015-09-21 MED ORDER — HEPARIN SOD (PORK) LOCK FLUSH 100 UNIT/ML IV SOLN
500.0000 [IU] | Freq: Once | INTRAVENOUS | Status: AC
  Administered 2015-09-21: 500 [IU]

## 2015-09-21 MED ORDER — VANCOMYCIN HCL IN DEXTROSE 1-5 GM/200ML-% IV SOLN
1000.0000 mg | INTRAVENOUS | Status: AC
  Administered 2015-09-22: 1000 mg via INTRAVENOUS
  Filled 2015-09-21: qty 200

## 2015-09-21 MED ORDER — POTASSIUM CHLORIDE CRYS ER 20 MEQ PO TBCR
40.0000 meq | EXTENDED_RELEASE_TABLET | Freq: Once | ORAL | Status: AC
  Administered 2015-09-21: 40 meq via ORAL
  Filled 2015-09-21: qty 2

## 2015-09-21 NOTE — ED Notes (Signed)
Blood was drawn form PICC line-upper Rt arm -- Biopatch in place No redness or signs of infection at site - Dressing clean , dry and intact

## 2015-09-21 NOTE — ED Provider Notes (Signed)
CSN: [id number]     Arrival date & time 09/21/15  1923 History   First MD Initiated Contact with Patient 09/21/15 2111     Chief Complaint  Patient presents with  . Fever      HPI Pt was seen at 2120. Per NH report and pt: Pt c/o gradual onset and persistence of constant "fever" that began today. NH temp was "101.6" PTA. Pt was then sent to the ED for further evaluation. Pt has hx of PICC line placement for IV abx after recent left knee wound infection s/p patellectomy. Pt herself states she has been "coughing a little" but denies any other complaints. Denies CP/SOB, no abd pain, no N/V/D, no back pain, no dysuria.    Past Medical History  Diagnosis Date  . Diabetes mellitus     takes Januvia,Metformin,and Glipizide daily  . Hyperlipidemia     takes Lovastatin nightly  . Anemia     takes Ferrous Gluconate daily  . Peripheral edema     takes Furosemide daily as needed  . Depression     takes Tofranil daily  . Hypertension     takes Amlodipine,Atenolol,and Benazepril daily  . Arthritis   . Joint pain   . Joint swelling   . Cataracts, bilateral     immature  . Infection of skin of knee     left   Past Surgical History  Procedure Laterality Date  . Breast biopsy Left   . Colonoscopy    . Esophagogastroduodenoscopy    . Ercp    . Patellectomy Left 07/11/2015    Procedure: Left Partial PATELLECTOMY;  Surgeon: Tarry Kos, MD;  [facility];  Service: Orthopedics;  Laterality: Left;  . Irrigation and debridement knee Left 08/15/2015    Procedure: IRRIGATION AND DEBRIDEMENT LEFT KNEE;  Surgeon: Tarry Kos, MD;  [facility];  Service: Orthopedics;  Laterality: Left;  . Patellectomy Left 08/15/2015    Procedure: Revision PATELLECTOMY;  Surgeon: Tarry Kos, MD;  [facility]: [facility];  Service: Orthopedics;  Laterality: Left;  . I&d extremity Left 08/18/2015    Procedure: IRRIGATION AND DEBRIDEMENT EXTREMITY;  Surgeon: Tarry Kos, MD;  Location: [facility];  Service: Orthopedics;   Laterality: Left;  Marland Kitchen Minor application of wound vac  08/18/2015    Procedure: MINOR APPLICATION OF WOUND VAC;  Surgeon: Tarry Kos, MD;  [facility]: [facility];  Service: Orthopedics;;  . Irrigation and debridement knee Left 08/20/2015    Procedure: IRRIGATION AND DEBRIDEMENT KNEE WITH PLACEMENT OF INTEGRA;  Surgeon: Tarry Kos, MD;  [facility];  Service: Orthopedics;  Laterality: Left;  . I&d extremity Left 09/10/2015    Procedure: LEFT KNEE SPLIT THICKNESS SKIN GRAFT, IRRIGATION AND DEBRIDEMENT, WOUND VAC;  Surgeon: Tarry Kos, MD;  [facility];  Service: Orthopedics;  Laterality: Left;  . Skin split graft Left 09/10/2015    Procedure: SKIN GRAFT SPLIT THICKNESS;  Surgeon: Tarry Kos, MD;  [facility];  Service: Orthopedics;  Laterality: Left;  . Application of wound vac Left 09/10/2015    Procedure: APPLICATION OF WOUND VAC;  Surgeon: Tarry Kos, MD;  [facility];  Service: Orthopedics;  Laterality: Left;  . I&d extremity Left 09/12/2015    Procedure: IRRIGATION AND DEBRIDEMENT LEFT KNEE WITH PLACEMENT OF WOUND VAC;  Surgeon: Tarry Kos, MD;  [facility];  Service: Orthopedics;  Laterality: Left;   Family History  Problem Relation Age of Onset  . Hypertension  Mother   . Diabetes Mother   . Hypertension Father    Social History  Substance Use Topics  . Smoking status: Never Smoker   . Smokeless tobacco: Never Used  . Alcohol Use: No    Review of Systems ROS: Statement: All systems negative except as marked or noted in the HPI; Constitutional: +fever and chills. ; ; Eyes: Negative for eye pain, redness and discharge. ; ; ENMT: Negative for ear pain, hoarseness, nasal congestion, sinus pressure and sore throat. ; ; Cardiovascular: Negative for chest pain, palpitations, diaphoresis, dyspnea and peripheral edema. ; ; Respiratory: +cough. Negative for wheezing and stridor. ; ; Gastrointestinal: Negative for nausea, vomiting, diarrhea, abdominal pain, blood in stool,  hematemesis, jaundice and rectal bleeding. . ; ; Genitourinary: Negative for dysuria, flank pain and hematuria. ; ; Musculoskeletal: Negative for back pain and neck pain. Negative for swelling and trauma.; ; Skin: Negative for pruritus, rash, abrasions, blisters, bruising and skin lesion.; ; Neuro: Negative for headache, lightheadedness and neck stiffness. Negative for weakness, altered level of consciousness , altered mental status, extremity weakness, paresthesias, involuntary movement, seizure and syncope.      Allergies  Review of patient's allergies indicates no known allergies.  Home Medications   Prior to Admission medications   Medication Sig Start Date End Date Taking? Authorizing Provider  amLODipine (NORVASC) 10 MG tablet TAKE 1 TABLET EVERY DAY 03/26/15  Yes Salley Scarlet, MD  aspirin EC 325 MG tablet Take 1 tablet (325 mg total) by mouth daily. 07/11/15  Yes Tarry Kos, MD  atenolol (TENORMIN) 50 MG tablet Take 1 tablet (50 mg total) by mouth daily. 03/14/14  Yes Salley Scarlet, MD  benazepril (LOTENSIN) 20 MG tablet TAKE 1 TABLET EVERY DAY 03/26/15  Yes Salley Scarlet, MD  ferrous sulfate 325 (65 FE) MG tablet Take 325 mg by mouth daily with breakfast.  08/07/15  Yes Historical Provider, MD  furosemide (LASIX) 20 MG tablet Take 1-2 tablets once a day as needed for leg swelling Patient taking differently: Take 20-40 mg by mouth daily. Take 1 tablet (20 mg) by mouth daily, may take an additional tablet (20 mg) as needed for feet swelling 04/21/15  Yes Salley Scarlet, MD  glipiZIDE (GLUCOTROL) 10 MG tablet TAKE 1 TABLET TWICE DAILY BEFORE A MEAL 02/27/15  Yes Salley Scarlet, MD  imipramine (TOFRANIL) 50 MG tablet Take 1 tablet (50 mg total) by mouth 3 (three) times daily. 04/21/15  Yes Salley Scarlet, MD  JANUVIA 25 MG tablet TAKE ONE TABLET BY MOUTH ONCE DAILY Patient taking differently: TAKE ONE TABLET BY MOUTH ONCE DAILY WITH BREAKFAST 06/04/15  Yes Salley Scarlet, MD   lovastatin (MEVACOR) 10 MG tablet Take 1 tablet (10 mg total) by mouth at bedtime. 05/21/14  Yes Salley Scarlet, MD  metFORMIN (GLUCOPHAGE) 1000 MG tablet TAKE 1 TABLET TWICE DAILY WITH A MEAL Patient taking differently: Take 1,000 mg by mouth 2 (two) times daily with a meal.  04/21/15  Yes Salley Scarlet, MD  oxyCODONE (OXY IR/ROXICODONE) 5 MG immediate release tablet Take 1-3 tablets (5-15 mg total) by mouth every 4 (four) hours as needed. Patient taking differently: Take 10 mg by mouth every 4 (four) hours as needed for moderate pain or severe pain.  09/17/15  Yes Kirt Boys, DO  vancomycin (VANCOCIN) 1 GM/200ML SOLN Inject 200 mLs (1,000 mg total) into the vein every 12 (twelve) hours. Patient taking differently: Inject 750 mg into the vein  every 12 (twelve) hours.  08/21/15 09/26/15 Yes Naiping Donnelly Stager, MD  glucose blood (ACCU-CHEK AVIVA) test strip Use to monitor FSBS 1x daily. Dx: E11.9 12/31/14   Salley Scarlet, MD   BP 137/79 mmHg  Pulse 64  Temp(Src) 99.7 F (37.6 C) (Rectal)  Resp 26  Ht  (1.676 m)  Wt 250 lb (113.399 kg)  BMI 40.37 kg/m2  SpO2 93% Physical Exam  2125: Physical examination:  Nursing notes reviewed; Vital signs and O2 SAT reviewed;  Constitutional: Well developed, Well nourished, Well hydrated, In no acute distress; Head:  Normocephalic, atraumatic; Eyes: EOMI, PERRL, No scleral icterus; ENMT: Mouth and pharynx normal, Mucous membranes moist; Neck: Supple, Full range of motion, No lymphadenopathy; Cardiovascular: Regular rate and rhythm, No gallop; Respiratory: Breath sounds coarse & equal bilaterally, No wheezes.  Speaking full sentences with ease, Normal respiratory effort/excursion; Chest: Nontender, Movement normal; Abdomen: Soft, Nontender, Nondistended, Normal bowel sounds; Genitourinary: No CVA tenderness; Extremities: Pulses normal, +wound vac left knee with serosanguinous drainage in tubing, no gross purulence, dressing appears C/D/I.  +knee  immobilizer left leg. No tenderness, No edema, No calf edema or asymmetry.; Neuro: AA&Ox3, vague historian. Major CN grossly intact. No facial droop. Speech clear. Moves all extremities spontaneously.; Skin: Color normal, Warm, Dry.   ED Course  Procedures (including critical care time) Labs Review   Imaging Review  I have personally reviewed and evaluated these images and lab results as part of my medical decision-making.   EKG Interpretation   Date/Time:  Sunday September 21 2015 21:30:00 EDT Ventricular Rate:  67 PR Interval:  151 QRS Duration: 138 QT Interval:  468 QTC Calculation: 494 R Axis:   15 Text Interpretation:  Sinus rhythm Right bundle branch block When compared  with ECG of 07/10/2015 No significant change was found Confirmed by [facility]   MD, Nicholos Johns [id number]) on 09/21/2015 9:58:00 PM      MDM  MDM Reviewed: previous chart, nursing note and vitals Reviewed previous: labs and ECG Interpretation: labs, x-ray and ECG     Results for orders placed or performed during the hospital encounter of 09/21/15  CBC with Differential  Result Value Ref Range   WBC 10.0 4.0 - 10.5 K/uL   RBC 3.25 (L) 3.87 - 5.11 MIL/uL   Hemoglobin 8.3 (L) 12.0 - 15.0 g/dL   HCT 81.1 (L) 91.4 - 78.2 %   MCV 78.2 78.0 - 100.0 fL   MCH 25.5 (L) 26.0 - 34.0 pg   MCHC 32.7 30.0 - 36.0 g/dL   RDW 95.6 (H) 21.3 - 08.6 %   Platelets 349 150 - 400 K/uL   Neutrophils Relative % 61 %   Neutro Abs 6.0 1.7 - 7.7 K/uL   Lymphocytes Relative 9 %   Lymphs Abs 0.9 0.7 - 4.0 K/uL   Monocytes Relative 9 %   Monocytes Absolute 0.9 0.1 - 1.0 K/uL   Eosinophils Relative 22 %   Eosinophils Absolute 2.1 (H) 0.0 - 0.7 K/uL   Basophils Relative 1 %   Basophils Absolute 0.1 0.0 - 0.1 K/uL  Urinalysis, Routine w reflex microscopic (not at [facility])  Result Value Ref Range   Color, Urine YELLOW YELLOW   APPearance CLOUDY (A) CLEAR   Specific Gravity, Urine 1.025 1.005 - 1.030   pH 6.0 5.0 - 8.0   Glucose,  UA 100 (A) NEGATIVE mg/dL   Hgb urine dipstick TRACE (A) NEGATIVE   Bilirubin Urine SMALL (A) NEGATIVE   Ketones, ur TRACE (A)  NEGATIVE mg/dL   Protein, ur 30 (A) NEGATIVE mg/dL   Urobilinogen, UA 1.0 0.0 - 1.0 mg/dL   Nitrite NEGATIVE NEGATIVE   Leukocytes, UA NEGATIVE NEGATIVE  Basic metabolic panel  Result Value Ref Range   Sodium 133 (L) 135 - 145 mmol/L   Potassium 2.9 (L) 3.5 - 5.1 mmol/L   Chloride 101 101 - 111 mmol/L   CO2 23 22 - 32 mmol/L   Glucose, Bld 194 (H) 65 - 99 mg/dL   BUN 15 6 - 20 mg/dL   Creatinine, Ser 1.610.97 0.44 - 1.00 mg/dL   Calcium 8.1 (L) 8.9 - 10.3 mg/dL   GFR calc non Af Amer 57 (L) >60 mL/min   GFR calc Af Amer >60 >60 mL/min   Anion gap 9 5 - 15  Troponin I  Result Value Ref Range   Troponin I <0.03 <0.031 ng/mL  Urine microscopic-add on  Result Value Ref Range   Squamous Epithelial / LPF RARE RARE   WBC, UA 3-6 <3 WBC/hpf   RBC / HPF 0-2 <3 RBC/hpf   Bacteria, UA FEW (A) RARE  I-Stat CG4 Lactic Acid, ED  Result Value Ref Range   Lactic Acid, Venous 1.23 0.5 - 2.0 mmol/L   Dg Chest Portable 1 View 09/21/2015  CLINICAL DATA:  Fever. EXAM: PORTABLE CHEST 1 VIEW COMPARISON:  None. FINDINGS: Airspace opacity at the right base concerning for pneumonia given the history. Right upper extremity PICC, tip at the SVC level. Normal heart size and aortic contours for technique and mild left rotation. Remote posterior right sixth rib fracture. IMPRESSION: Right base opacity concerning for pneumonia. Electronically Signed   By: Marnee SpringJonathon  Watts M.D.   On: 09/21/2015 20:47   Results for Nani SkillernFARRISH, Wilhemina M (MRN [id number]) as of 09/21/2015 23:45  Ref. Range 07/10/2015 13:53 08/14/2015 00:10 08/16/2015 04:59 09/10/2015 11:58 09/21/2015 20:31  Hemoglobin Latest Ref Range: 12.0-15.0 g/dL 81.112.6 9.5 (L) 9.2 (L) 8.8 (L) 8.3 (L)  HCT Latest Ref Range: 36.0-46.0 % 37.8 27.7 (L) 27.7 (L) 26.7 (L) 25.4 (L)     0005:  Potassium repleted PO. Pt with complicated PMHx, including  multiple OR I&D for left knee infection s/p patellectomy, also has PICC line; both could also be potential sites for infection/cause for fever. BC x2 are pending. IV abx for HCAP started.  Dx and testing d/w pt and family.  Questions answered.  Verb understanding, agreeable to admit. T/C to [facility] Dr. Conley RollsLe, case discussed, including:  HPI, pertinent PM/SHx, VS/PE, dx testing, ED course and treatment:  Agreeable to admit, requests he will come to the ED for evaluation.   Samuel JesterKathleen Nahum Sherrer, DO 09/24/15 2228

## 2015-09-21 NOTE — ED Notes (Signed)
[facility] patient with a wound vac on her knee- Brought over with fever

## 2015-09-21 NOTE — ED Notes (Signed)
[facility] sent [facility] for c/o rectal temp 101.6; Dr wants pt to have CXR and labs drawn; pt has wound vac to left knee and is confused, refusing to get in bed; [facility] and [facility] with [facility]; [facility] Mr. [name] and coaxed pt to move to bed

## 2015-09-22 ENCOUNTER — Telehealth: Payer: Self-pay | Admitting: *Deleted

## 2015-09-22 ENCOUNTER — Encounter (HOSPITAL_COMMUNITY): Payer: Self-pay | Admitting: Internal Medicine

## 2015-09-22 DIAGNOSIS — D552 Anemia due to disorders of glycolytic enzymes: Secondary | ICD-10-CM | POA: Diagnosis not present

## 2015-09-22 DIAGNOSIS — E1169 Type 2 diabetes mellitus with other specified complication: Secondary | ICD-10-CM | POA: Diagnosis present

## 2015-09-22 DIAGNOSIS — Z89612 Acquired absence of left leg above knee: Secondary | ICD-10-CM | POA: Diagnosis not present

## 2015-09-22 DIAGNOSIS — R509 Fever, unspecified: Secondary | ICD-10-CM | POA: Diagnosis not present

## 2015-09-22 DIAGNOSIS — B9562 Methicillin resistant Staphylococcus aureus infection as the cause of diseases classified elsewhere: Secondary | ICD-10-CM | POA: Diagnosis present

## 2015-09-22 DIAGNOSIS — D509 Iron deficiency anemia, unspecified: Secondary | ICD-10-CM | POA: Diagnosis not present

## 2015-09-22 DIAGNOSIS — T8189XA Other complications of procedures, not elsewhere classified, initial encounter: Secondary | ICD-10-CM | POA: Diagnosis present

## 2015-09-22 DIAGNOSIS — M8618 Other acute osteomyelitis, other site: Secondary | ICD-10-CM | POA: Diagnosis not present

## 2015-09-22 DIAGNOSIS — M869 Osteomyelitis, unspecified: Secondary | ICD-10-CM | POA: Diagnosis not present

## 2015-09-22 DIAGNOSIS — E871 Hypo-osmolality and hyponatremia: Secondary | ICD-10-CM | POA: Diagnosis not present

## 2015-09-22 DIAGNOSIS — Y95 Nosocomial condition: Secondary | ICD-10-CM | POA: Diagnosis present

## 2015-09-22 DIAGNOSIS — E876 Hypokalemia: Secondary | ICD-10-CM | POA: Diagnosis present

## 2015-09-22 DIAGNOSIS — Z7982 Long term (current) use of aspirin: Secondary | ICD-10-CM | POA: Diagnosis not present

## 2015-09-22 DIAGNOSIS — L89329 Pressure ulcer of left buttock, unspecified stage: Secondary | ICD-10-CM | POA: Diagnosis present

## 2015-09-22 DIAGNOSIS — Z6841 Body Mass Index (BMI) 40.0 and over, adult: Secondary | ICD-10-CM | POA: Diagnosis not present

## 2015-09-22 DIAGNOSIS — M009 Pyogenic arthritis, unspecified: Secondary | ICD-10-CM | POA: Diagnosis not present

## 2015-09-22 DIAGNOSIS — F329 Major depressive disorder, single episode, unspecified: Secondary | ICD-10-CM | POA: Diagnosis present

## 2015-09-22 DIAGNOSIS — E1140 Type 2 diabetes mellitus with diabetic neuropathy, unspecified: Secondary | ICD-10-CM | POA: Diagnosis present

## 2015-09-22 DIAGNOSIS — A4101 Sepsis due to Methicillin susceptible Staphylococcus aureus: Secondary | ICD-10-CM | POA: Diagnosis not present

## 2015-09-22 DIAGNOSIS — G9341 Metabolic encephalopathy: Secondary | ICD-10-CM | POA: Diagnosis not present

## 2015-09-22 DIAGNOSIS — R7881 Bacteremia: Secondary | ICD-10-CM | POA: Diagnosis not present

## 2015-09-22 DIAGNOSIS — E669 Obesity, unspecified: Secondary | ICD-10-CM | POA: Diagnosis not present

## 2015-09-22 DIAGNOSIS — N179 Acute kidney failure, unspecified: Secondary | ICD-10-CM | POA: Diagnosis not present

## 2015-09-22 DIAGNOSIS — T8131XD Disruption of external operation (surgical) wound, not elsewhere classified, subsequent encounter: Secondary | ICD-10-CM | POA: Diagnosis not present

## 2015-09-22 DIAGNOSIS — D649 Anemia, unspecified: Secondary | ICD-10-CM | POA: Diagnosis not present

## 2015-09-22 DIAGNOSIS — E785 Hyperlipidemia, unspecified: Secondary | ICD-10-CM | POA: Diagnosis present

## 2015-09-22 DIAGNOSIS — J189 Pneumonia, unspecified organism: Secondary | ICD-10-CM | POA: Diagnosis present

## 2015-09-22 DIAGNOSIS — I950 Idiopathic hypotension: Secondary | ICD-10-CM | POA: Diagnosis not present

## 2015-09-22 DIAGNOSIS — E875 Hyperkalemia: Secondary | ICD-10-CM | POA: Diagnosis not present

## 2015-09-22 DIAGNOSIS — I10 Essential (primary) hypertension: Secondary | ICD-10-CM | POA: Diagnosis not present

## 2015-09-22 DIAGNOSIS — E8770 Fluid overload, unspecified: Secondary | ICD-10-CM | POA: Diagnosis not present

## 2015-09-22 DIAGNOSIS — E46 Unspecified protein-calorie malnutrition: Secondary | ICD-10-CM | POA: Diagnosis present

## 2015-09-22 DIAGNOSIS — L89320 Pressure ulcer of left buttock, unstageable: Secondary | ICD-10-CM | POA: Diagnosis not present

## 2015-09-22 DIAGNOSIS — M86651 Other chronic osteomyelitis, right thigh: Secondary | ICD-10-CM | POA: Diagnosis present

## 2015-09-22 LAB — MAGNESIUM: Magnesium: 1.3 mg/dL — ABNORMAL LOW (ref 1.7–2.4)

## 2015-09-22 LAB — MRSA PCR SCREENING: MRSA by PCR: NEGATIVE

## 2015-09-22 MED ORDER — ACETAMINOPHEN 325 MG PO TABS
650.0000 mg | ORAL_TABLET | ORAL | Status: DC | PRN
  Administered 2015-09-22 – 2015-09-26 (×2): 650 mg via ORAL
  Filled 2015-09-22 (×2): qty 2

## 2015-09-22 MED ORDER — PRAVASTATIN SODIUM 20 MG PO TABS
10.0000 mg | ORAL_TABLET | Freq: Every day | ORAL | Status: DC
  Administered 2015-09-22 – 2015-09-28 (×7): 10 mg via ORAL
  Filled 2015-09-22 (×7): qty 1

## 2015-09-22 MED ORDER — HEPARIN SODIUM (PORCINE) 5000 UNIT/ML IJ SOLN
5000.0000 [IU] | Freq: Three times a day (TID) | INTRAMUSCULAR | Status: DC
  Administered 2015-09-22 – 2015-10-08 (×38): 5000 [IU] via SUBCUTANEOUS
  Filled 2015-09-22 (×36): qty 1

## 2015-09-22 MED ORDER — BENAZEPRIL HCL 10 MG PO TABS
20.0000 mg | ORAL_TABLET | Freq: Every day | ORAL | Status: DC
  Administered 2015-09-22 – 2015-09-24 (×3): 20 mg via ORAL
  Filled 2015-09-22 (×3): qty 2

## 2015-09-22 MED ORDER — VANCOMYCIN HCL IN DEXTROSE 1-5 GM/200ML-% IV SOLN
1000.0000 mg | Freq: Two times a day (BID) | INTRAVENOUS | Status: DC
  Administered 2015-09-22 – 2015-09-23 (×2): 1000 mg via INTRAVENOUS
  Filled 2015-09-22 (×5): qty 200

## 2015-09-22 MED ORDER — MAGNESIUM SULFATE 50 % IJ SOLN
2.0000 g | Freq: Once | INTRAVENOUS | Status: AC
  Administered 2015-09-22: 2 g via INTRAVENOUS
  Filled 2015-09-22: qty 4

## 2015-09-22 MED ORDER — POTASSIUM CHLORIDE CRYS ER 20 MEQ PO TBCR
20.0000 meq | EXTENDED_RELEASE_TABLET | Freq: Three times a day (TID) | ORAL | Status: DC
  Administered 2015-09-22 – 2015-09-23 (×4): 20 meq via ORAL
  Filled 2015-09-22 (×4): qty 1

## 2015-09-22 MED ORDER — IMIPRAMINE HCL 50 MG PO TABS
50.0000 mg | ORAL_TABLET | Freq: Three times a day (TID) | ORAL | Status: DC
  Administered 2015-09-22 – 2015-09-29 (×20): 50 mg via ORAL
  Filled 2015-09-22 (×12): qty 2
  Filled 2015-09-22: qty 1
  Filled 2015-09-22 (×2): qty 2
  Filled 2015-09-22 (×6): qty 1
  Filled 2015-09-22: qty 2
  Filled 2015-09-22: qty 1
  Filled 2015-09-22: qty 2
  Filled 2015-09-22: qty 1

## 2015-09-22 MED ORDER — FERROUS SULFATE 325 (65 FE) MG PO TABS
325.0000 mg | ORAL_TABLET | Freq: Every day | ORAL | Status: DC
  Administered 2015-09-22 – 2015-10-08 (×17): 325 mg via ORAL
  Filled 2015-09-22 (×17): qty 1

## 2015-09-22 MED ORDER — GLIPIZIDE 5 MG PO TABS
10.0000 mg | ORAL_TABLET | Freq: Every day | ORAL | Status: DC
Start: 2015-09-22 — End: 2015-09-22
  Administered 2015-09-22: 10 mg via ORAL
  Filled 2015-09-22: qty 2

## 2015-09-22 MED ORDER — ONDANSETRON HCL 4 MG/2ML IJ SOLN: 4.0000 mg | Freq: Four times a day (QID) | INTRAMUSCULAR | Status: DC | PRN

## 2015-09-22 MED ORDER — LINAGLIPTIN 5 MG PO TABS
5.0000 mg | ORAL_TABLET | Freq: Every day | ORAL | Status: DC
Start: 2015-09-22 — End: 2015-10-08
  Administered 2015-09-22 – 2015-10-08 (×17): 5 mg via ORAL
  Filled 2015-09-22 (×17): qty 1

## 2015-09-22 MED ORDER — METFORMIN HCL 500 MG PO TABS
1000.0000 mg | ORAL_TABLET | Freq: Two times a day (BID) | ORAL | Status: DC
  Administered 2015-09-22 – 2015-09-24 (×6): 1000 mg via ORAL
  Filled 2015-09-22 (×6): qty 2

## 2015-09-22 MED ORDER — INSULIN ASPART 100 UNIT/ML ~~LOC~~ SOLN
0.0000 [IU] | Freq: Three times a day (TID) | SUBCUTANEOUS | Status: DC
  Administered 2015-09-23: 1 [IU] via SUBCUTANEOUS
  Administered 2015-09-23: 2 [IU] via SUBCUTANEOUS
  Administered 2015-09-24 – 2015-09-25 (×4): 1 [IU] via SUBCUTANEOUS
  Administered 2015-09-26 (×2): 2 [IU] via SUBCUTANEOUS
  Administered 2015-09-26: 1 [IU] via SUBCUTANEOUS
  Administered 2015-09-27 – 2015-09-28 (×5): 2 [IU] via SUBCUTANEOUS
  Administered 2015-09-29: 3 [IU] via SUBCUTANEOUS
  Administered 2015-09-29: 1 [IU] via SUBCUTANEOUS
  Administered 2015-09-30 (×2): 2 [IU] via SUBCUTANEOUS
  Administered 2015-09-30: 3 [IU] via SUBCUTANEOUS
  Administered 2015-10-01: 1 [IU] via SUBCUTANEOUS
  Administered 2015-10-01 (×2): 2 [IU] via SUBCUTANEOUS
  Administered 2015-10-02 (×3): 1 [IU] via SUBCUTANEOUS
  Administered 2015-10-03 (×2): 2 [IU] via SUBCUTANEOUS
  Administered 2015-10-04: 1 [IU] via SUBCUTANEOUS
  Administered 2015-10-04 (×2): 2 [IU] via SUBCUTANEOUS
  Administered 2015-10-05 – 2015-10-06 (×3): 1 [IU] via SUBCUTANEOUS

## 2015-09-22 MED ORDER — ONDANSETRON HCL 4 MG PO TABS: 4.0000 mg | ORAL_TABLET | Freq: Four times a day (QID) | ORAL | Status: DC | PRN

## 2015-09-22 MED ORDER — MORPHINE SULFATE (PF) 2 MG/ML IV SOLN: 2.0000 mg | INTRAVENOUS | Status: DC | PRN

## 2015-09-22 MED ORDER — INSULIN ASPART 100 UNIT/ML ~~LOC~~ SOLN
0.0000 [IU] | Freq: Every day | SUBCUTANEOUS | Status: DC
  Administered 2015-09-27 – 2015-09-29 (×2): 2 [IU] via SUBCUTANEOUS

## 2015-09-22 MED ORDER — MUPIROCIN 2 % EX OINT
1.0000 "application " | TOPICAL_OINTMENT | Freq: Two times a day (BID) | CUTANEOUS | Status: AC
  Administered 2015-09-22 – 2015-09-26 (×10): 1 via NASAL
  Filled 2015-09-22 (×5): qty 22

## 2015-09-22 MED ORDER — CHLORHEXIDINE GLUCONATE CLOTH 2 % EX PADS
6.0000 | MEDICATED_PAD | Freq: Every day | CUTANEOUS | Status: AC
  Administered 2015-09-23 – 2015-09-26 (×4): 6 via TOPICAL

## 2015-09-22 MED ORDER — ASPIRIN EC 325 MG PO TBEC
325.0000 mg | DELAYED_RELEASE_TABLET | Freq: Every day | ORAL | Status: DC
  Administered 2015-09-22 – 2015-09-23 (×2): 325 mg via ORAL
  Filled 2015-09-22 (×3): qty 1

## 2015-09-22 MED ORDER — ATENOLOL 50 MG PO TABS
50.0000 mg | ORAL_TABLET | Freq: Every day | ORAL | Status: DC
  Administered 2015-09-22 – 2015-09-27 (×6): 50 mg via ORAL
  Filled 2015-09-22 (×3): qty 2
  Filled 2015-09-22: qty 1
  Filled 2015-09-22 (×3): qty 2

## 2015-09-22 MED ORDER — AMLODIPINE BESYLATE 10 MG PO TABS
10.0000 mg | ORAL_TABLET | Freq: Every day | ORAL | Status: DC
  Administered 2015-09-22 – 2015-09-27 (×6): 10 mg via ORAL
  Filled 2015-09-22 (×3): qty 2
  Filled 2015-09-22: qty 1
  Filled 2015-09-22 (×3): qty 2

## 2015-09-22 MED ORDER — PIPERACILLIN-TAZOBACTAM 3.375 G IVPB
3.3750 g | Freq: Three times a day (TID) | INTRAVENOUS | Status: DC
  Administered 2015-09-22 – 2015-09-25 (×10): 3.375 g via INTRAVENOUS
  Filled 2015-09-22 (×13): qty 50

## 2015-09-22 NOTE — Telephone Encounter (Signed)
Received fax from [facility] on 09/12/15 stating patient has been discharged from SNF  Date of admit:08/21/15  Date of discharge:09/10/15  Level of Care: Skilled Nursing Facility  Discharging facility:[facility]  Attending physician:XU,Michael,MD  PCP: Kingsley Spittle[place],MD  Discharge Dispostion: Discharged/transferred to short term hospital for inpatient care   DX:T81.4XXD- infection following a procedure,subsequent encounter

## 2015-09-22 NOTE — Progress Notes (Signed)
ANTIBIOTIC CONSULT NOTE-Preliminary  Pharmacy Consult for vancomycin Indication: pneumonia  No Known Allergies  Patient Measurements: Height: 5\' 6"  (167.6 cm) Weight: 250 lb (113.399 kg) IBW/kg (Calculated) : 59.3  Vital Signs: Temp: 99.7 F (37.6 C) (10/16 2222) Temp Source: Rectal (10/16 2222) BP: 137/79 mmHg (10/16 2230) Pulse Rate: 64 (10/16 2230)  Labs:  Recent Labs  09/19/15 0832 09/21/15 2031  WBC  --  10.0  HGB  --  8.3*  PLT  --  349  CREATININE 0.91 0.97    Estimated Creatinine Clearance: 66 mL/min (by C-G formula based on Cr of 0.97).   Recent Labs  09/19/15 0832  [facility]     Microbiology: No results found for this or any previous visit (from the past 720 hour(s)).  Medical History: Past Medical History  Diagnosis Date  . Diabetes mellitus     takes Januvia,Metformin,and Glipizide daily  . Hyperlipidemia     takes Lovastatin nightly  . Anemia     takes Ferrous Gluconate daily  . Peripheral edema     takes Furosemide daily as needed  . Depression     takes Tofranil daily  . Hypertension     takes Amlodipine,Atenolol,and Benazepril daily  . Arthritis   . Joint pain   . Joint swelling   . Cataracts, bilateral     immature  . Infection of skin of knee     left    Medications:  Scheduled:    Assessment: 74 yo female being treated for PNA.   Goal of Therapy:  Vancomycin trough level 15-20 mcg/ml  Plan:  Preliminary review of pertinent patient information completed.  Protocol will be initiated with a one-time dose(s) of vancomycin 1000mg  IV x 2 for loading dose of 2 grams.  Jeani HawkingAnnie Penn clinical pharmacist will complete review during morning rounds to assess patient and finalize treatment regimen.  Sofia Vanmeter Scarlett, RPH 09/22/2015,12:10 AM

## 2015-09-22 NOTE — Care Management Note (Signed)
Case Management Note  Patient Details  Name: Lindsay Cobb MRN: [id number] Date of Birth: 12/22/1940  Subjective/Objective:                  [facility] with [facility]. Anticipate discharge back to facility at discharge.  Action/Plan: CSW is aware and will arrange discharge to facility when medically stable.  Expected Discharge Date:                  Expected Discharge Plan:  Skilled Nursing Facility  In-House Referral:  Clinical Social Work  Discharge planning Services  CM Consult  Post Acute Care Choice:  NA Choice offered to:  NA  DME Arranged:    DME Agency:     HH Arranged:    HH Agency:     Status of Service:  Completed, signed off  Medicare Important Message Given:    Date Medicare IM Given:    Medicare IM give by:    Date Additional Medicare IM Given:    Additional Medicare Important Message give by:     If discussed at Long Length of Stay Meetings, dates discussed:    Additional Comments:  Cheryl FlashBlackwell, Coran Dipaola Crowder, RN 09/22/2015, 11:07 AM

## 2015-09-22 NOTE — Progress Notes (Signed)
Patient is a 74 year old woman with a history of DM, HTN, recent left knee surgery complicated by wound dehiscence and infection-treated with outpatient vancomycin and wound VAC, presented from the [facility] with fever. She was found to have right basilar pneumonia. Patient was briefly seen and examined. Her medical record, laboratory studies, and vital signs were reviewed. Agree with current management with additions below.   -We'll order wound care consult for reported left buttock wound and continued evaluation and monitoring of left knee VAC -We'll continue potassium chloride repletion orally. Magnesium level ordered and was found to be 1.3. We'll therefore, give her 2 g of magnesium sulfate IV. -We'll order an anemia panel for progressive anemia, but likely secondary to subacute blood loss from recent surgeries and wound VAC. -We'll hold glipizide to avoid symptomatic hypoglycemia and start sliding scale NovoLog. Continue metformin and Tradjenta

## 2015-09-22 NOTE — H&P (Signed)
[facility] History and Physical  Lindsay Cobb:[id number] DOB: 01/14/1941    PCP:   [name], MD   Chief Complaint: fever.  HPI: Lindsay Cobb is an 74 y.o. female with hx of DM, HLD, anemia, HTN, s/p knee surgery, unfortunately with dehiscence and infected, currently on Vancomycin through PICC line, with wound vac, presented to the ER as she was experiencing chills, and having subjective fever.  No SOB, CP, but she had some productive coughs.  Evaluation in the ER included no leukocytosis, normal renal Fx, and K of 2.9.  Her CXR showed new infiltrate suggestive of PNA.  She was given IV Zosyn, and hospitalist was asked to admit her for HCAP.    Rewiew of Systems:  Constitutional: Negative for malaise, fever and chills. No significant weight loss or weight gain Eyes: Negative for eye pain, redness and discharge, diplopia, visual changes, or flashes of light. ENMT: Negative for ear pain, hoarseness, nasal congestion, sinus pressure and sore throat. No headaches; tinnitus, drooling, or problem swallowing. Cardiovascular: Negative for chest pain, palpitations, diaphoresis, dyspnea and peripheral edema. ; No orthopnea, PND Respiratory: Negative for cough, hemoptysis, wheezing and stridor. No pleuritic chestpain. Gastrointestinal: Negative for nausea, vomiting, diarrhea, constipation, abdominal pain, melena, blood in stool, hematemesis, jaundice and rectal bleeding.    Genitourinary: Negative for frequency, dysuria, incontinence,flank pain and hematuria; Musculoskeletal: Negative for back pain and neck pain. Negative for swelling and trauma.;  Skin: . Negative for pruritus, rash, abrasions, bruising and skin lesion.; ulcerations. Wound vac on knee without evidence of infection.  Right PICC line site looks OK.  Neuro: Negative for headache, lightheadedness and neck stiffness. Negative for weakness, altered level of consciousness , altered mental status, extremity weakness,  burning feet, involuntary movement, seizure and syncope.  Psych: negative for anxiety, depression, insomnia, tearfulness, panic attacks, hallucinations, paranoia, suicidal or homicidal ideation    Past Medical History  Diagnosis Date  . Diabetes mellitus     takes Januvia,Metformin,and Glipizide daily  . Hyperlipidemia     takes Lovastatin nightly  . Anemia     takes Ferrous Gluconate daily  . Peripheral edema     takes Furosemide daily as needed  . Depression     takes Tofranil daily  . Hypertension     takes Amlodipine,Atenolol,and Benazepril daily  . Arthritis   . Joint pain   . Joint swelling   . Cataracts, bilateral     immature  . Infection of skin of knee     left    Past Surgical History  Procedure Laterality Date  . Breast biopsy Left   . Colonoscopy    . Esophagogastroduodenoscopy    . Ercp    . Patellectomy Left 07/11/2015    Procedure: Left Partial PATELLECTOMY;  Surgeon: Tarry Kos, MD;  Location: [facility] OR;  Service: Orthopedics;  Laterality: Left;  . Irrigation and debridement knee Left 08/15/2015    Procedure: IRRIGATION AND DEBRIDEMENT LEFT KNEE;  Surgeon: Tarry Kos, MD;  Location: [facility] OR;  Service: Orthopedics;  Laterality: Left;  . Patellectomy Left 08/15/2015    Procedure: Revision PATELLECTOMY;  Surgeon: Tarry Kos, MD;  Location: [facility] OR;  Service: Orthopedics;  Laterality: Left;  . I&d extremity Left 08/18/2015    Procedure: IRRIGATION AND DEBRIDEMENT EXTREMITY;  Surgeon: Tarry Kos, MD;  Location: [facility] OR;  Service: Orthopedics;  Laterality: Left;  Marland Kitchen Minor application of wound vac  08/18/2015    Procedure: MINOR APPLICATION OF WOUND VAC;  Surgeon: Tarry Kos, MD;  Location[facility] OR;  Service: Orthopedics;;  . Irrigation and debridement knee Left 08/20/2015    Procedure: IRRIGATION AND DEBRIDEMENT KNEE WITH PLACEMENT OF INTEGRA;  Surgeon: Tarry Kos, MD;  Location: [facility] OR;  Service: Orthopedics;  Laterality: Left;  . I&d extremity Left 09/10/2015     Procedure: LEFT KNEE SPLIT THICKNESS SKIN GRAFT, IRRIGATION AND DEBRIDEMENT, WOUND VAC;  Surgeon: Tarry Kos, MD;  Location: [facility] OR;  Service: Orthopedics;  Laterality: Left;  . Skin split graft Left 09/10/2015    Procedure: SKIN GRAFT SPLIT THICKNESS;  Surgeon: Tarry Kos, MD;  Location: [facility] OR;  Service: Orthopedics;  Laterality: Left;  . Application of wound vac Left 09/10/2015    Procedure: APPLICATION OF WOUND VAC;  Surgeon: Tarry Kos, MD;  Location: [facility] OR;  Service: Orthopedics;  Laterality: Left;  . I&d extremity Left 09/12/2015    Procedure: IRRIGATION AND DEBRIDEMENT LEFT KNEE WITH PLACEMENT OF WOUND VAC;  Surgeon: Tarry Kos, MD;  Location: [facility] OR;  Service: Orthopedics;  Laterality: Left;    Medications:  HOME MEDS: Prior to Admission medications   Medication Sig Start Date End Date Taking? Authorizing Provider  amLODipine (NORVASC) 10 MG tablet TAKE 1 TABLET EVERY DAY 03/26/15  Yes Salley Scarlet, MD  aspirin EC 325 MG tablet Take 1 tablet (325 mg total) by mouth daily. 07/11/15  Yes Tarry Kos, MD  atenolol (TENORMIN) 50 MG tablet Take 1 tablet (50 mg total) by mouth daily. 03/14/14  Yes Salley Scarlet, MD  benazepril (LOTENSIN) 20 MG tablet TAKE 1 TABLET EVERY DAY 03/26/15  Yes Salley Scarlet, MD  ferrous sulfate 325 (65 FE) MG tablet Take 325 mg by mouth daily with breakfast.  08/07/15  Yes Historical Provider, MD  furosemide (LASIX) 20 MG tablet Take 1-2 tablets once a day as needed for leg swelling Patient taking differently: Take 20-40 mg by mouth daily. Take 1 tablet (20 mg) by mouth daily, may take an additional tablet (20 mg) as needed for feet swelling 04/21/15  Yes Salley Scarlet, MD  glipiZIDE (GLUCOTROL) 10 MG tablet TAKE 1 TABLET TWICE DAILY BEFORE A MEAL 02/27/15  Yes Salley Scarlet, MD  imipramine (TOFRANIL) 50 MG tablet Take 1 tablet (50 mg total) by mouth 3 (three) times daily. 04/21/15  Yes Salley Scarlet, MD  JANUVIA 25 MG tablet TAKE ONE TABLET BY MOUTH  ONCE DAILY Patient taking differently: TAKE ONE TABLET BY MOUTH ONCE DAILY WITH BREAKFAST 06/04/15  Yes Salley Scarlet, MD  lovastatin (MEVACOR) 10 MG tablet Take 1 tablet (10 mg total) by mouth at bedtime. 05/21/14  Yes Salley Scarlet, MD  metFORMIN (GLUCOPHAGE) 1000 MG tablet TAKE 1 TABLET TWICE DAILY WITH A MEAL Patient taking differently: Take 1,000 mg by mouth 2 (two) times daily with a meal.  04/21/15  Yes Salley Scarlet, MD  oxyCODONE (OXY IR/ROXICODONE) 5 MG immediate release tablet Take 1-3 tablets (5-15 mg total) by mouth every 4 (four) hours as needed. Patient taking differently: Take 10 mg by mouth every 4 (four) hours as needed for moderate pain or severe pain.  09/17/15  Yes Kirt Boys, DO  vancomycin (VANCOCIN) 1 GM/200ML SOLN Inject 200 mLs (1,000 mg total) into the vein every 12 (twelve) hours. Patient taking differently: Inject 750 mg into the vein every 12 (twelve) hours.  08/21/15 09/26/15 Yes Naiping Donnelly Stager, MD  glucose blood (ACCU-CHEK AVIVA) test strip Use to  monitor FSBS 1x daily. Dx: E11.9 12/31/14   Salley Scarlet, MD     Allergies:  No Known Allergies  Social History:   reports that she has never smoked. She has never used smokeless tobacco. She reports that she does not drink alcohol or use illicit drugs.  Family History: Family History  Problem Relation Age of Onset  . Hypertension Mother   . Diabetes Mother   . Hypertension Father      Physical Exam: Filed Vitals:   09/21/15 1932 09/21/15 2200 09/21/15 2222 09/21/15 2230  BP: 149/57 115/64  137/79  Pulse: 83 69  64  Temp: 98.5 F (36.9 C)  99.7 F (37.6 C)   TempSrc: Oral  Rectal   Resp: Height:  (1.676 m)     Weight: 113.399 kg (250 lb)     SpO2: 94% 94%  93%   Blood pressure 137/79, pulse 64, temperature 99.7 F (37.6 C), temperature source Rectal, resp. rate 26, height  (1.676 m), weight 113.399 kg (250 lb), SpO2 93 %.  GEN:  Pleasant  patient lying in the  stretcher in no acute distress; cooperative with exam. PSYCH:  alert and oriented x4; does not appear anxious or depressed; affect is appropriate. HEENT: Mucous membranes pink and anicteric; PERRLA; EOM intact; no cervical lymphadenopathy nor thyromegaly or carotid bruit; no JVD; There were no stridor. Neck is very supple. Breasts:: Not examined CHEST WALL: No tenderness CHEST: Normal respiration, clear to auscultation bilaterally.  HEART: Regular rate and rhythm.  There are no murmur, rub, or gallops.   BACK: No kyphosis or scoliosis; no CVA tenderness ABDOMEN: soft and non-tender; no masses, no organomegaly, normal abdominal bowel sounds; no pannus; no intertriginous candida. There is no rebound and no distention. Rectal Exam: Not done EXTREMITIES: No bone or joint deformity; age-appropriate arthropathy of the hands and knees; no edema; no ulcerations.  There is no calf tenderness. Genitalia: not examined PULSES: 2+ and symmetric SKIN: Normal hydration no rash or ulceration. Wound vac looks OK.  PICC Site looks non infected.  CNS: Cranial nerves 2-12 grossly intact no focal lateralizing neurologic deficit.  Speech is fluent; uvula elevated with phonation, facial symmetry and tongue midline. DTR are normal bilaterally, cerebella exam is intact, barbinski is negative and strengths are equaled bilaterally.  No sensory loss.   Labs on Admission:  Basic Metabolic Panel:  Recent Labs Lab 09/18/15 0851 09/19/15 0832 09/21/15 2031  NA  --   --  133*  K  --   --  2.9*  CL  --   --  101  CO2  --   --  23  GLUCOSE  --   --  194*  BUN CREATININE 0.88 0.91 0.97  CALCIUM  --   --  8.1*   CBC:  Recent Labs Lab 09/21/15 2031  WBC 10.0  NEUTROABS 6.0  HGB 8.3*  HCT 25.4*  MCV 78.2  PLT 349   Cardiac Enzymes:  Recent Labs Lab 09/21/15 2031  TROPONINI <0.03    CBG:  Recent Labs Lab 09/15/15 1238 09/15/15 1635 09/15/15 2152 09/16/15 0654 09/16/15 1202  GLUCAP  132* 80 126* 148* 114*     Radiological Exams on Admission: Dg Chest Portable 1 View  09/21/2015  CLINICAL DATA:  Fever. EXAM: PORTABLE CHEST 1 VIEW COMPARISON:  None. FINDINGS: Airspace opacity at the right base concerning for pneumonia given the history. Right upper extremity PICC, tip at  the SVC level. Normal heart size and aortic contours for technique and mild left rotation. Remote posterior right sixth rib fracture. IMPRESSION: Right base opacity concerning for pneumonia. Electronically Signed   By: Marnee SpringJonathon  Watts M.D.   On: 09/21/2015 20:47    Assessment/Plan Present on Admission:  . Obesity . Wound dehiscence, surgical . Anemia . HCAP (healthcare-associated pneumonia)  PLAN:  Will admit her for HCAP.  Will continue with Van/Zosyn IV thru the PICC Line.  Will obtain a set of BC.  For her DM, will continue with her meds.  Her anemia is stable and will follow.  I dont' think she has a line sepsis, but we will be cognizant of it.  She is stable full code, [facility]  Thank you and Good Day.   Other plans as per orders.  Code Status:FULL CODE.   Houston SirenLE,Rosealie Reach, MD. [facility] Pager [phone] 7pm to 7am.  09/22/2015, 12:28 AM

## 2015-09-22 NOTE — Progress Notes (Signed)
ANTIBIOTIC CONSULT NOTE-Preliminary  Pharmacy Consult for vancomycin Indication: pneumonia  No Known Allergies  Patient Measurements: Height: 5\' 6"  (167.6 cm) Weight: 250 lb (113.399 kg) IBW/kg (Calculated) : 59.3  Vital Signs: Temp: 98.1 F (36.7 C) (10/17 0310) Temp Source: Oral (10/17 0310) BP: 149/64 mmHg (10/17 0310) Pulse Rate: 79 (10/17 0310)  Labs:  Recent Labs  09/21/15 2031  WBC 10.0  HGB 8.3*  PLT 349  CREATININE 0.97    Estimated Creatinine Clearance: 66 mL/min (by C-G formula based on Cr of 0.97).  No results for input(s): VANCOTROUGH, VANCOPEAK, VANCORANDOM, GENTTROUGH, GENTPEAK, GENTRANDOM, TOBRATROUGH, TOBRAPEAK, TOBRARND, AMIKACINPEAK, AMIKACINTROU, AMIKACIN in the last 72 hours.   Microbiology: Recent Results (from the past 720 hour(s))  Culture, blood (routine x 2)     Status: None (Preliminary result)   Collection Time: 09/21/15  8:16 PM  Result Value Ref Range Status   Specimen Description A-LINE  Final   Special Requests   Final    BOTTLES DRAWN AEROBIC AND ANAEROBIC 6CC DRAWN BY RN   Culture PENDING  Incomplete   Report Status PENDING  Incomplete  Culture, blood (routine x 2)     Status: None (Preliminary result)   Collection Time: 09/21/15 11:30 PM  Result Value Ref Range Status   Specimen Description RIGHT ANTECUBITAL  Final   Special Requests BOTTLES DRAWN AEROBIC AND ANAEROBIC 6CC  Final   Culture PENDING  Incomplete   Report Status PENDING  Incomplete    Medical History: Past Medical History  Diagnosis Date  . Diabetes mellitus     takes Januvia,Metformin,and Glipizide daily  . Hyperlipidemia     takes Lovastatin nightly  . Anemia     takes Ferrous Gluconate daily  . Peripheral edema     takes Furosemide daily as needed  . Depression     takes Tofranil daily  . Hypertension     takes Amlodipine,Atenolol,and Benazepril daily  . Arthritis   . Joint pain   . Joint swelling   . Cataracts, bilateral     immature  .  Infection of skin of knee     left    Medications:  Scheduled:  . amLODipine  10 mg Oral Daily  . aspirin EC  325 mg Oral Daily  . atenolol  50 mg Oral Daily  . benazepril  20 mg Oral Daily  . Chlorhexidine Gluconate Cloth  6 each Topical Q0600  . ferrous sulfate  325 mg Oral Q breakfast  . glipiZIDE  10 mg Oral QAC breakfast  . heparin  5,000 Units Subcutaneous 3 times per day  . imipramine  50 mg Oral TID  . linagliptin  5 mg Oral Daily  . metFORMIN  1,000 mg Oral BID WC  . mupirocin ointment  1 application Nasal BID  . piperacillin-tazobactam  3.375 g Intravenous Q8H  . potassium chloride SA  20 mEq Oral TID  . pravastatin  10 mg Oral q1800    Assessment: 74 yo female being admitted  for HCAP(Cxray showed new infiltrate). Patient presented to ED with chills, and subjective fever. In addition, she is s/p knee surgery which she had a wound dehiscence and was infected with MRSA. Has been on Vancomycin since 08/21/15 and was scheduled to end 09/26/15. She has had debridements and wound vac in place and sent to skilled nursing facility 09/16/15.  Goal of Therapy:  Vancomycin trough level 15-20 mcg/ml  Plan:  Vancomycin 2gm loading dose given. Will continue with Vancomycin 1gm IV q12h Monitor  renal function and vital signs Monitor labs and vancomycin levels prn  Elder Cyphers, BS Loura Back, [place] Clinical Pharmacist Pager [phone]  09/22/2015,9:21 AM

## 2015-09-22 NOTE — Clinical Social Work Note (Signed)
Clinical Social Work Assessment  Patient Details  Name: Lindsay Cobb MRN: [id number] Date of Birth: Nov 16, 1941  Date of referral:  09/22/15               Reason for consult:  Facility Placement                Permission sought to share information with:  Family Supports Permission granted to share information::  Yes, Verbal Permission Granted  Name::        Agency::     Relationship::  husband  Contact Information:     Housing/Transportation Living arrangements for the past 2 months:  [place] of Information:  Patient, Spouse Patient Interpreter Needed:  None Criminal Activity/Legal Involvement Pertinent to Current Situation/Hospitalization:  No - Comment as needed Significant Relationships:  Spouse Lives with:  Facility Resident Do you feel safe going back to the place where you live?  Yes Need for family participation in patient care:  Yes (Comment)  Care giving concerns:  Pt is resident at [facility].    Social Worker assessment / plan:  CSW met with pt and pt's husband at bedside. Pt alert, but somewhat confused regarding details of admission/SNF stay. Pt's husband shared she has been at [facility] for about a week now. Prior to this, pt was at [facility], but family requested she be placed closer to home when she was admitted to [facility]. Pt has a wound vac to knee. She has also been receiving physical therapy at SNF. Pt and husband request return to [facility] when medically stable. Per Marianna Fuss at facility, pt is skilled level of care and okay to return. No FL2 needed. Pt has [employer] and will seek re-authorization as pt approaches d/c.   Employment status:  Retired Nurse, adult PT Recommendations:  Not assessed at this time Information / Referral to community resources:  Other (Comment Required) (return to [facility])  Patient/Family's Response to care:  Pt and family request return to [facility] at d/c.   Patient/Family's Understanding of  and Emotional Response to Diagnosis, Current Treatment, and Prognosis:  Pt unsure of reason for hospital admission, but husband shares that he was made aware she was admitted due to pneumonia. [facility] plans to discuss possibly applying for Medicaid with husband soon.   Emotional Assessment Appearance:  Appears stated age Attitude/Demeanor/Rapport:  Other (Cooperative) Affect (typically observed):  Appropriate Orientation:  Oriented to Self, Oriented to Place Alcohol / Substance use:  Not Applicable Psych involvement (Current and /or in the community):  No (Comment)  Discharge Needs  Concerns to be addressed:  Discharge Planning Concerns Readmission within the last 30 days:  Yes Current discharge risk:  None Barriers to Discharge:  Continued Medical Work up   [employer], [employer], [place] 09/22/2015, 8:57 AM [phone]

## 2015-09-22 NOTE — Consult Note (Signed)
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  NPWT (VAC) therapy in place.  Wound type:Nonhealing surgical wound Unstageable pressure injury Pressure Ulcer POA: Yes Measurement:Left knee 8 cm x 4 cm x 1 cm  LEft buttocks 4 cm x 3.2 cm 100% adherent thin yellow slough to wound bed Wound ZOX:WRUEbed:LEft knee pale pink with visible tendon in distal wound bed.  Sacrum 100% yellow slough Drainage (amount, consistency, odor) Minimal serosanguious left knee Scant serosanguinous to left buttocks Periwound:Intact Dressing procedure/placement/frequency:Cleanse left knee wound with NS and pat gently dry.  Granufoam to wound bed, cover with VAC drape.  Seal immediately achieved at 125 mmHg continuous pressure.  Knee brace back in place.  Cleanse left buttock wound with NS and pat gently dry.  Apply Santyl to wound bed.  Cover with NS moist gauze.  Secure with ABD pad and tape.  Change daily.  Will not follow at this time.  Please re-consult if needed.  Maple HudsonKaren Itzamara Casas RN BSN CWON Pager [phone]

## 2015-09-23 DIAGNOSIS — T8131XS Disruption of external operation (surgical) wound, not elsewhere classified, sequela: Secondary | ICD-10-CM

## 2015-09-23 DIAGNOSIS — D509 Iron deficiency anemia, unspecified: Secondary | ICD-10-CM

## 2015-09-23 DIAGNOSIS — E876 Hypokalemia: Secondary | ICD-10-CM

## 2015-09-23 DIAGNOSIS — L89320 Pressure ulcer of left buttock, unstageable: Secondary | ICD-10-CM

## 2015-09-23 DIAGNOSIS — L8994 Pressure ulcer of unspecified site, stage 4: Secondary | ICD-10-CM | POA: Diagnosis present

## 2015-09-23 DIAGNOSIS — I10 Essential (primary) hypertension: Secondary | ICD-10-CM

## 2015-09-23 LAB — CBC
HEMATOCRIT: 27 % — AB (ref 36.0–46.0)
Hemoglobin: 8.8 g/dL — ABNORMAL LOW (ref 12.0–15.0)
MCH: 25.4 pg — AB (ref 26.0–34.0)
MCHC: 32.6 g/dL (ref 30.0–36.0)
MCV: 78 fL (ref 78.0–100.0)
PLATELETS: 380 10*3/uL (ref 150–400)
RBC: 3.46 MIL/uL — AB (ref 3.87–5.11)
RDW: 15.8 % — ABNORMAL HIGH (ref 11.5–15.5)
WBC: 10.6 10*3/uL — ABNORMAL HIGH (ref 4.0–10.5)

## 2015-09-23 LAB — COMPREHENSIVE METABOLIC PANEL
ALT: 15 U/L (ref 14–54)
AST: 18 U/L (ref 15–41)
Albumin: 2.2 g/dL — ABNORMAL LOW (ref 3.5–5.0)
Alkaline Phosphatase: 59 U/L (ref 38–126)
Anion gap: 7 (ref 5–15)
BUN: 13 mg/dL (ref 6–20)
CHLORIDE: 104 mmol/L (ref 101–111)
CO2: 26 mmol/L (ref 22–32)
CREATININE: 1.01 mg/dL — AB (ref 0.44–1.00)
Calcium: 8.3 mg/dL — ABNORMAL LOW (ref 8.9–10.3)
GFR calc non Af Amer: 54 mL/min — ABNORMAL LOW (ref >60)
Glucose, Bld: 152 mg/dL — ABNORMAL HIGH (ref 65–99)
POTASSIUM: 3.5 mmol/L (ref 3.5–5.1)
SODIUM: 137 mmol/L (ref 135–145)
Total Bilirubin: 0.4 mg/dL (ref 0.3–1.2)
Total Protein: 6.4 g/dL — ABNORMAL LOW (ref 6.5–8.1)

## 2015-09-23 LAB — VANCOMYCIN, TROUGH: VANCOMYCIN TR: 25 ug/mL — AB (ref 10.0–20.0)

## 2015-09-23 LAB — IRON AND TIBC
IRON: 17 ug/dL — AB (ref 28–170)
Saturation Ratios: 11 % (ref 10.4–31.8)
TIBC: 160 ug/dL — ABNORMAL LOW (ref 250–450)
UIBC: 143 ug/dL

## 2015-09-23 LAB — GLUCOSE, CAPILLARY
GLUCOSE-CAPILLARY: 166 mg/dL — AB (ref 65–99)
GLUCOSE-CAPILLARY: 149 mg/dL — AB (ref 65–99)
GLUCOSE-CAPILLARY: 170 mg/dL — AB (ref 65–99)
GLUCOSE-CAPILLARY: 99 mg/dL (ref 65–99)
Glucose-Capillary: 117 mg/dL — ABNORMAL HIGH (ref 65–99)

## 2015-09-23 LAB — VITAMIN B12: Vitamin B-12: 625 pg/mL (ref 180–914)

## 2015-09-23 LAB — URINE CULTURE

## 2015-09-23 LAB — FOLATE: Folate: 16.6 ng/mL (ref >5.9)

## 2015-09-23 LAB — FERRITIN: Ferritin: 158 ng/mL (ref 11–307)

## 2015-09-23 LAB — TSH: TSH: 1.111 u[IU]/mL (ref 0.350–4.500)

## 2015-09-23 MED ORDER — COLLAGENASE 250 UNIT/GM EX OINT
TOPICAL_OINTMENT | Freq: Every day | CUTANEOUS | Status: AC
  Administered 2015-09-23 – 2015-10-06 (×11): via TOPICAL
  Filled 2015-09-23 (×2): qty 30

## 2015-09-23 MED ORDER — SODIUM CHLORIDE 0.9 % IV SOLN
1750.0000 mg | INTRAVENOUS | Status: DC
  Administered 2015-09-23 – 2015-09-24 (×2): 1750 mg via INTRAVENOUS
  Filled 2015-09-23 (×3): qty 1750

## 2015-09-23 MED ORDER — ALBUTEROL SULFATE (2.5 MG/3ML) 0.083% IN NEBU
2.5000 mg | INHALATION_SOLUTION | Freq: Three times a day (TID) | RESPIRATORY_TRACT | Status: DC
  Administered 2015-09-23 (×2): 2.5 mg via RESPIRATORY_TRACT
  Filled 2015-09-23 (×2): qty 3

## 2015-09-23 MED ORDER — POTASSIUM CHLORIDE CRYS ER 20 MEQ PO TBCR
20.0000 meq | EXTENDED_RELEASE_TABLET | Freq: Two times a day (BID) | ORAL | Status: DC
  Administered 2015-09-23 – 2015-09-27 (×9): 20 meq via ORAL
  Filled 2015-09-23 (×10): qty 1

## 2015-09-23 NOTE — Progress Notes (Addendum)
[facility] PROGRESS NOTE  Nani SkillernHelen M Stigger EXB:[id number] DOB: 02/27/1941 DOA: 09/21/2015 PCP: Milinda AntisURHAM, KAWANTA, MD    Code Status: Full code Family Communication: Discussed with her husband. Disposition Plan: Discharge back to his skilled nursing facility with clinically appropriate, possibly in the next couple days.   Consultants:  Wound care nurse.  Procedures:  Continuing left lower extremity VAC  Antibiotics:  IV Vancomycin, started doing previous hospitalization on 09/10/15 and continued outpatient; restarted 09/22/15>>  Zosyn 10/17>>  HPI/Subjective: Patient's cough has improved. She denies shortness of breath at rest. She denies chest pain.  Objective: Filed Vitals:   09/23/15 0545  BP: 144/61  Pulse: 76  Temp: 97.7 F (36.5 C)  Resp: 20   oxygen saturation 99%.  Intake/Output Summary (Last 24 hours) at 09/23/15 1301 Last data filed at 09/23/15 0800  Gross per 24 hour  Intake    610 ml  Output      0 ml  Net    610 ml   Filed Weights   09/21/15 1932  Weight: 113.399 kg (250 lb)    Exam:   General:  Pleasant obese [age] old woman in no acute distress.  Cardiovascular: S1, S2, with a soft systolic murmur.  Respiratory: Decreased breath sounds at bases and clear anteriorly.  Abdomen: Obese, positive bowel sounds, soft, nontender, nondistended.  Musculoskeletal/extremities: Left knee brace in place; left knee mildly edematous, wound VAC apparatus in place, trace of bilateral pedal edema. Serosanguineous fluid in the wound VAC device.  Skin: Unstageable left buttock ulcer without drainage or malodor; pink base.  Data Reviewed: Basic Metabolic Panel:  Recent Labs Lab 09/18/15 0851 09/19/15 0832 09/21/15 2031 09/21/15 2330 09/23/15 0634  NA  --   --  133*  --  137  K  --   --  2.9*  --  3.5  CL  --   --  101  --  104  CO2  --   --  23  --  26  GLUCOSE  --   --  194*  --  152*  BUN 14 16 15   --  13  CREATININE 0.88 0.91 0.97  --   1.01*  CALCIUM  --   --  8.1*  --  8.3*  MG  --   --   --  1.3*  --    Liver Function Tests:  Recent Labs Lab 09/23/15 0634  AST 18  ALT 15  ALKPHOS 59  BILITOT 0.4  PROT 6.4*  ALBUMIN 2.2*   No results for input(s): LIPASE, AMYLASE in the last 168 hours. No results for input(s): AMMONIA in the last 168 hours. CBC:  Recent Labs Lab 09/21/15 2031 09/23/15 0634  WBC 10.0 10.6*  NEUTROABS 6.0  --   HGB 8.3* 8.8*  HCT 25.4* 27.0*  MCV 78.2 78.0  PLT 349 380   Cardiac Enzymes:  Recent Labs Lab 09/21/15 2031  TROPONINI <0.03   BNP (last 3 results) No results for input(s): BNP in the last 8760 hours.  ProBNP (last 3 results) No results for input(s): PROBNP in the last 8760 hours.  CBG:  Recent Labs Lab 09/22/15 2027 09/23/15 0747 09/23/15 1135  GLUCAP 166* 149* 170*    Recent Results (from the past 240 hour(s))  Culture, blood (routine x 2)     Status: None (Preliminary result)   Collection Time: 09/21/15  8:16 PM  Result Value Ref Range Status   Specimen Description BLOOD A-LINE DRAW DRAWN BY RN  Final  Special Requests BOTTLES DRAWN AEROBIC AND ANAEROBIC 6CC  Final   Culture NO GROWTH 2 DAYS  Final   Report Status PENDING  Incomplete  Culture, blood (routine x 2)     Status: None (Preliminary result)   Collection Time: 09/21/15 11:30 PM  Result Value Ref Range Status   Specimen Description BLOOD RIGHT ANTECUBITAL  Final   Special Requests BOTTLES DRAWN AEROBIC AND ANAEROBIC 6CC  Final   Culture NO GROWTH 2 DAYS  Final   Report Status PENDING  Incomplete  Urine culture     Status: None   Collection Time: 09/21/15 11:53 PM  Result Value Ref Range Status   Specimen Description URINE, CLEAN CATCH  Final   Special Requests NONE  Final   Culture   Final    MULTIPLE SPECIES PRESENT, SUGGEST RECOLLECTION Performed at [facility]    Report Status 09/23/2015 FINAL  Final  MRSA PCR Screening     Status: None   Collection Time: 09/22/15  1:52 PM   Result Value Ref Range Status   MRSA by PCR NEGATIVE NEGATIVE Final    Comment:        The GeneXpert MRSA Assay (FDA approved for NASAL specimens only), is one component of a comprehensive MRSA colonization surveillance program. It is not intended to diagnose MRSA infection nor to guide or monitor treatment for MRSA infections.      Studies: Dg Chest Portable 1 View  09/21/2015  CLINICAL DATA:  Fever. EXAM: PORTABLE CHEST 1 VIEW COMPARISON:  None. FINDINGS: Airspace opacity at the right base concerning for pneumonia given the history. Right upper extremity PICC, tip at the SVC level. Normal heart size and aortic contours for technique and mild left rotation. Remote posterior right sixth rib fracture. IMPRESSION: Right base opacity concerning for pneumonia. Electronically Signed   By: Marnee Spring M.D.   On: 09/21/2015 20:47    Scheduled Meds: . amLODipine  10 mg Oral Daily  . aspirin EC  325 mg Oral Daily  . atenolol  50 mg Oral Daily  . benazepril  20 mg Oral Daily  . Chlorhexidine Gluconate Cloth  6 each Topical Q0600  . collagenase   Topical Daily  . ferrous sulfate  325 mg Oral Q breakfast  . heparin  5,000 Units Subcutaneous 3 times per day  . imipramine  50 mg Oral TID  . insulin aspart  0-5 Units Subcutaneous QHS  . insulin aspart  0-9 Units Subcutaneous TID WC  . linagliptin  5 mg Oral Daily  . metFORMIN  1,000 mg Oral BID WC  . mupirocin ointment  1 application Nasal BID  . piperacillin-tazobactam  3.375 g Intravenous Q8H  . potassium chloride SA  20 mEq Oral TID  . pravastatin  10 mg Oral q1800   Continuous Infusions:   Assessment and plan:  Principal Problem:   HCAP (healthcare-associated pneumonia) Active Problems:   Wound dehiscence, surgical   Decubitus ulcer of left buttock, unstageable (HCC)   Type II diabetes mellitus (HCC)   Essential hypertension, benign   Obesity   Microcytic anemia   Hypokalemia   1. Healthcare acquired pneumonia. The  patient was admitted from the [facility] after spending one week at [facility] following wound dehiscence surgery of the left knee. On admission, her temperature was 99.7. She was hemodynamically stable. Her white blood cell count was within normal limits. Her chest x-ray revealed right basilar pneumonia. -She was started on broad spectrum antibiotic treatment with Zosyn and vancomycin. -Blood  cultures are negative to date. -She has some mild wheezing on exam today, so will start albuterol nebulizer. -She is clinically improving otherwise and could likely be discharged in the next 48 hours.  Wound dehiscence/infection right knee. The patient was hospitalized at [facility] from 09/10/15 through 09/16/15 for left knee wound and infection. She underwent surgical debridement by Dr. [name]. Wound VAC was placed and she was discharged to the [facility] for rehabilitation and IV antibiotics. The patient was started on antibiotics on 09/10/15 during hospitalization and is to continue IV vancomycin for a total of 6 weeks per Dr. Warren Danes note. Per his note, from 09/16/15, the patient is to follow-up with him in 2 weeks and subsequently with plastic surgery;, apparently the patient refused gastro flap recommended. -Wound care nurse was consulted. She added granufoam to the wound bed and reapplied the wound VAC drape; it was sealed immediately to achieve 125 mmHg continuous pressure; knee brace back in place. -Patient will return to SNF for ongoing wound therapy when medically improved and appropriate for discharge.  Microcytic anemia, likely from subacute perioperative blood loss/blood loss from the wound VAC.  Patient's hemoglobin 2 months ago was within normal limits at 12.6. However following the surgical debridements and application of the wound VAC, her hemoglobin has drifted downward since then. It was 8.82 weeks ago. She presented with a hemoglobin of 8.3 during this admission. She is on 325 mg of aspirin, possibly forward DVT  prophylaxis at the SNF. We'll discontinue this as she is on subcutaneous heparin in the hospital. -Anemia panel has been ordered and is pending. Her TSH is within normal limit at 1.1.  -Packed red blood cell transfusion not indicated yet. We'll continue for sulfate. -Continue to monitor.  Type 2 diabetes mellitus. Patient is treated with  glipizide, metformin and Tradjenta chronically. Glipizide was held to avoid symptomatic hypoglycemia during hospitalization. Her other 2 medications were continued. She was started on sliding scale NovoLog. Her CBGs have been reasonable. -Hemoglobin A1c pending.  Hypertension. Patient's blood pressure is reasonable on amlodipine and benazepril. Lasix is on hold.  Hypokalemia and mild hypomagnesemia. The patient was treated with 2 g of magnesium sulfate for a serum potassium of 1.3. She was given potassium chloride supplementation for serum potassium of 2.9. -We'll continue to monitor her serum potassium and magnesium. We'll continue potassium chloride supplementation and magnesium supplementation when necessary.    Time spent: 35 minutes.    [facility]  [facility] Pager [phone]. If 7PM-7AM, please contact night-coverage at [email or web address], password [email or web address] 09/23/2015, 1:01 PM  LOS: 1 day

## 2015-09-23 NOTE — Progress Notes (Signed)
ANTIBIOTIC CONSULT NOTE-  Pharmacy Consult for vancomycin and zosyn Indication: pneumonia  No Known Allergies  Patient Measurements: Height: 5\' 6"  (167.6 cm) Weight: 250 lb (113.399 kg) IBW/kg (Calculated) : 59.3  Vital Signs: Temp: 97.7 F (36.5 C) (10/18 0545) Temp Source: Oral (10/18 0545) BP: 144/61 mmHg (10/18 0545) Pulse Rate: 76 (10/18 0545)  Labs:  Recent Labs  09/21/15 2031 09/23/15 0634  WBC 10.0 10.6*  HGB 8.3* 8.8*  PLT 349 380  CREATININE 0.97 1.01*    Estimated Creatinine Clearance: 63.4 mL/min (by C-G formula based on Cr of 1.01).   Recent Labs  09/23/15 1030  VANCOTROUGH 25*    Microbiology: Recent Results (from the past 720 hour(s))  Culture, blood (routine x 2)     Status: None (Preliminary result)   Collection Time: 09/21/15  8:16 PM  Result Value Ref Range Status   Specimen Description BLOOD A-LINE DRAW DRAWN BY RN  Final   Special Requests BOTTLES DRAWN AEROBIC AND ANAEROBIC 6CC  Final   Culture NO GROWTH 2 DAYS  Final   Report Status PENDING  Incomplete  Culture, blood (routine x 2)     Status: None (Preliminary result)   Collection Time: 09/21/15 11:30 PM  Result Value Ref Range Status   Specimen Description BLOOD RIGHT ANTECUBITAL  Final   Special Requests BOTTLES DRAWN AEROBIC AND ANAEROBIC 6CC  Final   Culture NO GROWTH 2 DAYS  Final   Report Status PENDING  Incomplete  Urine culture     Status: None   Collection Time: 09/21/15 11:53 PM  Result Value Ref Range Status   Specimen Description URINE, CLEAN CATCH  Final   Special Requests NONE  Final   Culture   Final    MULTIPLE SPECIES PRESENT, SUGGEST RECOLLECTION Performed at [facility]    Report Status 09/23/2015 FINAL  Final  MRSA PCR Screening     Status: None   Collection Time: 09/22/15  1:52 PM  Result Value Ref Range Status   MRSA by PCR NEGATIVE NEGATIVE Final    Comment:        The GeneXpert MRSA Assay (FDA approved for NASAL specimens only), is one  component of a comprehensive MRSA colonization surveillance program. It is not intended to diagnose MRSA infection nor to guide or monitor treatment for MRSA infections.     Medical History: Past Medical History  Diagnosis Date  . Diabetes mellitus     takes Januvia,Metformin,and Glipizide daily  . Hyperlipidemia     takes Lovastatin nightly  . Anemia     takes Ferrous Gluconate daily  . Peripheral edema     takes Furosemide daily as needed  . Depression     takes Tofranil daily  . Hypertension     takes Amlodipine,Atenolol,and Benazepril daily  . Arthritis   . Joint pain   . Joint swelling   . Cataracts, bilateral     immature  . Infection of skin of knee     left    Medications:  Scheduled:  . amLODipine  10 mg Oral Daily  . aspirin EC  325 mg Oral Daily  . atenolol  50 mg Oral Daily  . benazepril  20 mg Oral Daily  . Chlorhexidine Gluconate Cloth  6 each Topical Q0600  . collagenase   Topical Daily  . ferrous sulfate  325 mg Oral Q breakfast  . heparin  5,000 Units Subcutaneous 3 times per day  . imipramine  50 mg Oral TID  .  insulin aspart  0-5 Units Subcutaneous QHS  . insulin aspart  0-9 Units Subcutaneous TID WC  . linagliptin  5 mg Oral Daily  . metFORMIN  1,000 mg Oral BID WC  . mupirocin ointment  1 application Nasal BID  . piperacillin-tazobactam  3.375 g Intravenous Q8H  . potassium chloride SA  20 mEq Oral TID  . pravastatin  10 mg Oral q1800   Assessment: 74 yo female being admitted  for HCAP(Cxray showed new infiltrate). Patient presented to ED with chills, and subjective fever. In addition, she is s/p knee surgery which she had a wound dehiscence and was infected with MRSA. Has been on Vancomycin since 08/21/15 and was scheduled to end 09/26/15. She has had debridements and wound vac in place and sent to skilled nursing facility 09/16/15.  Trough level is above goal.  Pharmacokinetic dosing service       Vancomycin single level  analysis: Current dose being given: 1000 mg Current dosing interval:  12 hrs Current infusion time (hrs): 1  Single level Trough Data:  Trough level obtained: 25 mcg/ml Timing of trough - Number of hours since last dose:  10 Hrs Desired peak:  35 mcg/ml  Desired trough: 15 mcg/ml  Estimated PK Parameters: --------------------------- New rate constant (kel): 0.041 hr-1 New half-life: 16.91 Hours New Vd from levels: 79.38  Liters  (0.7 L/kg)  Recommendations: ==================== Give Vancomycin 1750 mg  q 24 hrs. Infuse over 2 hrs Expected Cpeak: 33.8 mcg/ml    Expected Ctrough: 13.7 mcg/ml  Recommended labs and intervals: Measure Bun and Scr 3 times/week.   Thank you for the consult, will continue to follow.  Goal of Therapy:  Vancomycin trough level 15-20 mcg/ml  Plan:  Vancomycin  IV q24hrs (dose modification) Zosyn 3.375gm IV q8hrs Recheck Vancomycin trough level at steady state or sooner if warranted Monitor labs, renal fxn, progress and c/s  Valrie Hart, PharmD 09/23/2015,1:16 PM

## 2015-09-24 LAB — BASIC METABOLIC PANEL
Anion gap: 9 (ref 5–15)
BUN: 15 mg/dL (ref 6–20)
CHLORIDE: 105 mmol/L (ref 101–111)
CO2: 24 mmol/L (ref 22–32)
CREATININE: 1.23 mg/dL — AB (ref 0.44–1.00)
Calcium: 8.5 mg/dL — ABNORMAL LOW (ref 8.9–10.3)
GFR, EST AFRICAN AMERICAN: 49 mL/min — AB (ref >60)
GFR, EST NON AFRICAN AMERICAN: 42 mL/min — AB (ref >60)
Glucose, Bld: 142 mg/dL — ABNORMAL HIGH (ref 65–99)
POTASSIUM: 4.3 mmol/L (ref 3.5–5.1)
SODIUM: 138 mmol/L (ref 135–145)

## 2015-09-24 LAB — CBC
HCT: 30.4 % — ABNORMAL LOW (ref 36.0–46.0)
Hemoglobin: 9.6 g/dL — ABNORMAL LOW (ref 12.0–15.0)
MCH: 24.9 pg — ABNORMAL LOW (ref 26.0–34.0)
MCHC: 31.6 g/dL (ref 30.0–36.0)
MCV: 78.8 fL (ref 78.0–100.0)
PLATELETS: 451 10*3/uL — AB (ref 150–400)
RBC: 3.86 MIL/uL — AB (ref 3.87–5.11)
RDW: 16 % — AB (ref 11.5–15.5)
WBC: 13.9 10*3/uL — AB (ref 4.0–10.5)

## 2015-09-24 LAB — GLUCOSE, CAPILLARY
GLUCOSE-CAPILLARY: 132 mg/dL — AB (ref 65–99)
GLUCOSE-CAPILLARY: 144 mg/dL — AB (ref 65–99)
Glucose-Capillary: 138 mg/dL — ABNORMAL HIGH (ref 65–99)
Glucose-Capillary: 144 mg/dL — ABNORMAL HIGH (ref 65–99)

## 2015-09-24 LAB — MAGNESIUM: Magnesium: 1.7 mg/dL (ref 1.7–2.4)

## 2015-09-24 LAB — HEMOGLOBIN A1C
HEMOGLOBIN A1C: 7.4 % — AB (ref 4.8–5.6)
MEAN PLASMA GLUCOSE: 166 mg/dL

## 2015-09-24 MED ORDER — ALBUTEROL SULFATE (2.5 MG/3ML) 0.083% IN NEBU
2.5000 mg | INHALATION_SOLUTION | RESPIRATORY_TRACT | Status: DC | PRN
  Administered 2015-09-27: 2.5 mg via RESPIRATORY_TRACT
  Filled 2015-09-24 (×2): qty 3

## 2015-09-24 MED ORDER — SODIUM CHLORIDE 0.45 % IV SOLN
INTRAVENOUS | Status: DC
  Administered 2015-09-24 – 2015-09-25 (×2): via INTRAVENOUS

## 2015-09-24 NOTE — Progress Notes (Signed)
[facility] PROGRESS NOTE  Lindsay Cobb WUJ:[id number] DOB: 09/07/1941 DOA: 09/21/2015 PCP: [name], MD  Assessment/Plan: 1. HCAP, revealed on CXR 10/16. Afebrile. WBC 13.9. BC pending but reveals no growth to date. Continue IV abx. Likely transition to oral abx tomorrow.  2. Wound dehiscence/infection left knee with recent hospitalization from 10/5-10/11 for similar. Surgical debridement performed by Dr. [name] on 10/7 with subsequent wound vac (patient refused gastro flap) . Wound care nurse changed dressing on 10/17. Recommendations on last discharge were for 6 weeks of IV abx.  3. Microcytic anemia, likely from subacute perioperative blood loss/blood loss from wound VAC. Stable. Hgb 9.6 4. AKI. Creatinine 1.23 today. Continue IVF and monitor. If creatinine continues to rise, may need to discontinue vancomycin/ACE 5. DM type 2, stable. Continue SSI 6. Essential HTN, stable. Continue Amlodipine and Benazepril.  7. Hypokalemia, repleted. 8. Hypomagnesemia, repleted.    Code Status: Full DVT prophylaxis Heparin Family Communication: Husband at bedside. Discussed with patient who understands and has no concerns at this time. Disposition Plan: Anticipate discharge to rehab facility within 1-2 days.    Consultants:  Wound Care Nurse  Procedures:    Antibiotics:  Zosyn 10/16>>  Vancomycin 10/16>>  HPI/Subjective: Denies any SOB or wheezing. Has a mild cough but denies any production. Does not have much of an appetite.   Objective: Filed Vitals:   09/24/15 0500  BP: 129/53  Pulse: 80  Temp: 98.1 F (36.7 C)  Resp: 22    Intake/Output Summary (Last 24 hours) at 09/24/15 0747 Last data filed at 09/24/15 0600  Gross per 24 hour  Intake   1010 ml  Output    220 ml  Net    790 ml   Filed Weights   09/21/15 1932  Weight: 113.399 kg (250 lb)    Exam:   General: NAD, looks comfortable  Cardiovascular: RRR, S1, S2   Respiratory: Diminished breath  sounds bilaterally, No wheezing, rales or rhonchi  Abdomen: soft, non tender, no distention , bowel sounds normal  Musculoskeletal: Wound VAC over left knee and LE in brace, no pedal edema.  Data Reviewed: Basic Metabolic Panel:  Recent Labs Lab 09/18/15 0851 09/19/15 0832 09/21/15 2031 09/21/15 2330 09/23/15 0634 09/24/15 0629  NA  --   --  133*  --  137 138  K  --   --  2.9*  --  3.5 4.3  CL  --   --  101  --  104 105  CO2  --   --  23  --  26 24  GLUCOSE  --   --  194*  --  152* 142*  BUN --  13 15  CREATININE 0.88 0.91 0.97  --  1.01* 1.23*  CALCIUM  --   --  8.1*  --  8.3* 8.5*  MG  --   --   --  1.3*  --  1.7   Liver Function Tests:  Recent Labs Lab 09/23/15 0634  AST 18  ALT 15  ALKPHOS 59  BILITOT 0.4  PROT 6.4*  ALBUMIN 2.2*    CBC:  Recent Labs Lab 09/21/15 2031 09/23/15 0634 09/24/15 0629  WBC 10.0 10.6* 13.9*  NEUTROABS 6.0  --   --   HGB 8.3* 8.8* 9.6*  HCT 25.4* 27.0* 30.4*  MCV 78.2 78.0 78.8  PLT 349 380 451*   Cardiac Enzymes:  Recent Labs Lab 09/21/15 2031  TROPONINI <0.03     CBG:  Recent Labs Lab 09/23/15 0747 09/23/15 1135 09/23/15 1637 09/23/15 2041 09/24/15 0725  GLUCAP 149* 170* 99 117* 132*    Recent Results (from the past 240 hour(s))  Culture, blood (routine x 2)     Status: None (Preliminary result)   Collection Time: 09/21/15  8:16 PM  Result Value Ref Range Status   Specimen Description BLOOD A-LINE DRAW DRAWN BY RN  Final   Special Requests BOTTLES DRAWN AEROBIC AND ANAEROBIC 6CC  Final   Culture NO GROWTH 2 DAYS  Final   Report Status PENDING  Incomplete  Culture, blood (routine x 2)     Status: None (Preliminary result)   Collection Time: 09/21/15 11:30 PM  Result Value Ref Range Status   Specimen Description BLOOD RIGHT ANTECUBITAL  Final   Special Requests BOTTLES DRAWN AEROBIC AND ANAEROBIC 6CC  Final   Culture NO GROWTH 2 DAYS  Final   Report Status PENDING  Incomplete  Urine culture      Status: None   Collection Time: 09/21/15 11:53 PM  Result Value Ref Range Status   Specimen Description URINE, CLEAN CATCH  Final   Special Requests NONE  Final   Culture   Final    MULTIPLE SPECIES PRESENT, SUGGEST RECOLLECTION Performed at [facility]    Report Status 09/23/2015 FINAL  Final  MRSA PCR Screening     Status: None   Collection Time: 09/22/15  1:52 PM  Result Value Ref Range Status   MRSA by PCR NEGATIVE NEGATIVE Final    Comment:        The GeneXpert MRSA Assay (FDA approved for NASAL specimens only), is one component of a comprehensive MRSA colonization surveillance program. It is not intended to diagnose MRSA infection nor to guide or monitor treatment for MRSA infections.      Studies: No results found.  Scheduled Meds: . amLODipine  10 mg Oral Daily  . atenolol  50 mg Oral Daily  . benazepril  20 mg Oral Daily  . Chlorhexidine Gluconate Cloth  6 each Topical Q0600  . collagenase   Topical Daily  . ferrous sulfate  325 mg Oral Q breakfast  . heparin  5,000 Units Subcutaneous 3 times per day  . imipramine  50 mg Oral TID  . insulin aspart  0-5 Units Subcutaneous QHS  . insulin aspart  0-9 Units Subcutaneous TID WC  . linagliptin  5 mg Oral Daily  . metFORMIN  1,000 mg Oral BID WC  . mupirocin ointment  1 application Nasal BID  . piperacillin-tazobactam  3.375 g Intravenous Q8H  . potassium chloride SA  20 mEq Oral BID  . pravastatin  10 mg Oral q1800  . vancomycin  1,750 mg Intravenous Q24H   Continuous Infusions:   Principal Problem:   HCAP (healthcare-associated pneumonia) Active Problems:   Type II diabetes mellitus (HCC)   Essential hypertension, benign   Obesity   Microcytic anemia   Wound dehiscence, surgical   Hypokalemia   Decubitus ulcer of left buttock, unstageable (HCC)    Time spent: 20 minutes  Jehanzeb Memon. MD  [facility] Pager [phone]. If 7PM-7AM, please contact night-coverage at [email or web address],  password [email or web address] 09/24/2015, 7:47 AM  LOS: 2 days     By signing my name below, I, Burnett Harry, attest that this documentation has been prepared under the direction and in the presence of [facility]. MD Electronically Signed: Burnett Harry, Scribe. 09/24/2015 10:48am  I, Dr. Erick Blinks, personally performed the services described  in this documentaiton. All medical record entries made by the scribe were at my direction and in my presence. I have reviewed the chart and agree that the record reflects my personal performance and is accurate and complete  Erick BlinksJehanzeb Memon, MD, 09/24/2015 11:04 AM

## 2015-09-25 DIAGNOSIS — D649 Anemia, unspecified: Secondary | ICD-10-CM | POA: Diagnosis present

## 2015-09-25 LAB — BASIC METABOLIC PANEL
ANION GAP: 9 (ref 5–15)
BUN: 20 mg/dL (ref 6–20)
CALCIUM: 8.1 mg/dL — AB (ref 8.9–10.3)
CO2: 21 mmol/L — ABNORMAL LOW (ref 22–32)
Chloride: 104 mmol/L (ref 101–111)
Creatinine, Ser: 1.46 mg/dL — ABNORMAL HIGH (ref 0.44–1.00)
GFR, EST AFRICAN AMERICAN: 40 mL/min — AB (ref >60)
GFR, EST NON AFRICAN AMERICAN: 34 mL/min — AB (ref >60)
GLUCOSE: 154 mg/dL — AB (ref 65–99)
POTASSIUM: 4.4 mmol/L (ref 3.5–5.1)
SODIUM: 134 mmol/L — AB (ref 135–145)

## 2015-09-25 LAB — GLUCOSE, CAPILLARY
GLUCOSE-CAPILLARY: 148 mg/dL — AB (ref 65–99)
GLUCOSE-CAPILLARY: 149 mg/dL — AB (ref 65–99)
GLUCOSE-CAPILLARY: 187 mg/dL — AB (ref 65–99)
GLUCOSE-CAPILLARY: 162 mg/dL — AB (ref 65–99)

## 2015-09-25 LAB — CBC
HEMATOCRIT: 30.1 % — AB (ref 36.0–46.0)
HEMOGLOBIN: 9.6 g/dL — AB (ref 12.0–15.0)
MCH: 25.2 pg — ABNORMAL LOW (ref 26.0–34.0)
MCHC: 31.9 g/dL (ref 30.0–36.0)
MCV: 79 fL (ref 78.0–100.0)
Platelets: 450 10*3/uL — ABNORMAL HIGH (ref 150–400)
RBC: 3.81 MIL/uL — AB (ref 3.87–5.11)
RDW: 16.3 % — ABNORMAL HIGH (ref 11.5–15.5)
WBC: 15.9 10*3/uL — AB (ref 4.0–10.5)

## 2015-09-25 MED ORDER — DAPTOMYCIN 500 MG IV SOLR
INTRAVENOUS | Status: AC
  Filled 2015-09-25: qty 20

## 2015-09-25 MED ORDER — DIPHENHYDRAMINE HCL 25 MG PO CAPS
25.0000 mg | ORAL_CAPSULE | Freq: Three times a day (TID) | ORAL | Status: DC | PRN
  Administered 2015-09-25 (×2): 25 mg via ORAL
  Filled 2015-09-25 (×3): qty 1

## 2015-09-25 MED ORDER — SODIUM CHLORIDE 0.9 % IV SOLN
700.0000 mg | INTRAVENOUS | Status: AC
  Administered 2015-09-25 – 2015-10-02 (×8): 700 mg via INTRAVENOUS
  Filled 2015-09-25 (×10): qty 14

## 2015-09-25 NOTE — Progress Notes (Signed)
Pt was complaining of some itching on her back. I noticed that pt has redness and small bumps on her back, upper arms, and chest. Dr.Lama made aware. No new orders at this time. Will continue to monitor

## 2015-09-25 NOTE — Progress Notes (Signed)
ANTIBIOTIC CONSULT NOTE-  Pharmacy Consult for Daptomycin Indication: pneumonia / bacteremia  No Known Allergies  Patient Measurements: Height:  (167.6 cm) Weight: 250 lb (113.399 kg) IBW/kg (Calculated) : 59.3  Vital Signs: Temp: 98.4 F (36.9 C) (10/20 1441) Temp Source: Oral (10/20 1441) BP: 130/46 mmHg (10/20 1441) Pulse Rate: 71 (10/20 1441)  Labs:  Recent Labs  09/23/15 0634 09/24/15 0629 09/25/15 0601  WBC 10.6* 13.9* 15.9*  HGB 8.8* 9.6* 9.6*  PLT 380 451* 450*  CREATININE 1.01* 1.23* 1.46*   Estimated Creatinine Clearance: 43.8 mL/min (by C-G formula based on Cr of 1.46).   Recent Labs  09/23/15 1030  VANCOTROUGH 25*    Microbiology: Recent Results (from the past 720 hour(s))  Culture, blood (routine x 2)     Status: None (Preliminary result)   Collection Time: 09/21/15  8:16 PM  Result Value Ref Range Status   Specimen Description BLOOD A-LINE DRAW DRAWN BY RN  Final   Special Requests BOTTLES DRAWN AEROBIC AND ANAEROBIC 6CC  Final   Culture  Setup Time   Final    GRAM POSITIVE COCCI IN CLUSTERS RECOVERED FROM THE ANAEROBIC BOTTLE Gram Stain Report Called to,Read Back By and Verified With: COVINGTON,L. AT 1414 ON 09/24/2015 BY BAUGHAM,M. Performed at [facility]    Culture   Final    GRAM POSITIVE COCCI CULTURE REINCUBATED FOR BETTER GROWTH Performed at [facility]    Report Status PENDING  Incomplete  Culture, blood (routine x 2)     Status: None (Preliminary result)   Collection Time: 09/21/15 11:30 PM  Result Value Ref Range Status   Specimen Description BLOOD RIGHT ANTECUBITAL  Final   Special Requests BOTTLES DRAWN AEROBIC AND ANAEROBIC 6CC  Final   Culture NO GROWTH 4 DAYS  Final   Report Status PENDING  Incomplete  Urine culture     Status: None   Collection Time: 09/21/15 11:53 PM  Result Value Ref Range Status   Specimen Description URINE, CLEAN CATCH  Final   Special Requests NONE  Final   Culture   Final    MULTIPLE SPECIES PRESENT, SUGGEST RECOLLECTION Performed at [facility]    Report Status 09/23/2015 FINAL  Final  MRSA PCR Screening     Status: None   Collection Time: 09/22/15  1:52 PM  Result Value Ref Range Status   MRSA by PCR NEGATIVE NEGATIVE Final    Comment:        The GeneXpert MRSA Assay (FDA approved for NASAL specimens only), is one component of a comprehensive MRSA colonization surveillance program. It is not intended to diagnose MRSA infection nor to guide or monitor treatment for MRSA infections.    Medical History: Past Medical History  Diagnosis Date  . Diabetes mellitus     takes Januvia,Metformin,and Glipizide daily  . Hyperlipidemia     takes Lovastatin nightly  . Anemia     takes Ferrous Gluconate daily  . Peripheral edema     takes Furosemide daily as needed  . Depression     takes Tofranil daily  . Hypertension     takes Amlodipine,Atenolol,and Benazepril daily  . Arthritis   . Joint pain   . Joint swelling   . Cataracts, bilateral     immature  . Infection of skin of knee     left   Medications:  Scheduled:  . amLODipine  10 mg Oral Daily  . atenolol  50 mg Oral Daily  . Chlorhexidine  Gluconate Cloth  6 each Topical [id number]  . collagenase   Topical Daily  . DAPTOmycin (CUBICIN)  IV  700 mg Intravenous Q24H  . ferrous sulfate  325 mg Oral Q breakfast  . heparin  5,000 Units Subcutaneous 3 times per day  . imipramine  50 mg Oral TID  . insulin aspart  0-5 Units Subcutaneous QHS  . insulin aspart  0-9 Units Subcutaneous TID WC  . linagliptin  5 mg Oral Daily  . mupirocin ointment  1 application Nasal BID  . potassium chloride SA  20 mEq Oral BID  . pravastatin  10 mg Oral q1800   Assessment: 74 yo female being admitted  for HCAP(Cxray showed new infiltrate). Patient presented to ED with chills, and subjective fever. In addition, she is s/p knee surgery which she had a wound dehiscence and was infected with MRSA. Has been on  Vancomycin since 08/21/15 and was scheduled to end 09/26/15. She has had debridements and wound vac in place and sent to skilled nursing facility 09/16/15.  During this admission pt has been on Vancomycin and Zosyn.  Now switching to Daptomycin per Dr's Memon / Daiva EvesVan Dam (ID svc). SCr elevated, rising since admission,  Normalized clcr ~ 938ml/min  Goal of Therapy:  Eradicate infection  Plan:  Daptomycin 700mg  IV q24hrs (~ 6mg /Kg per dose) Monitor labs, renal fxn, progress and c/s If renal fxn worsens (clcr < 30) will switch to q48hr dosing.   Valrie HartScott Elodia Haviland, PharmD 09/25/2015,4:50 PM

## 2015-09-25 NOTE — Progress Notes (Addendum)
[facility] PROGRESS NOTE  Lindsay Cobb ZOX:[id number] DOB: 1941-11-18 DOA: 09/21/2015 PCP: [name], MD  Assessment/Plan: 1. HCAP, revealed on CXR 10/16. Remains afebrile. WBC trending up 15.9. Blood cultures show one out of 2 positive for gram-positive cocci in clusters. We will discontinue Zosyn and vancomycin. Start the patient on Cubicin. 2. Wound dehiscence/infection left knee with recent hospitalization from 10/5-10/11 for similar. Surgical debridement performed by Dr. [name] on 10/7 with subsequent wound vac. Wound cultures at that time were positive for MRSA. Wound care nurse changed dressing on 10/17. Recommendations on last discharge were for 6 weeks of IV abx.  3. Microcytic anemia, likely from subacute perioperative blood loss/blood loss from wound VAC. Remains stable with Hgb 9.6. 4. AKI. Creatinine 1.46 today. Continue IVF and monitor. Since creatinine continues to rise, discontinue ACE-I and Vancomycin. Discussed with Dr. Algis Liming, infectious disease who recommended starting the patient on Cubicin until blood culture sensitivities can be further identified.  5. DM type 2, stable. Continue SSI 6. Essential HTN, stable. Continue Amlodipine and atenolol. Dsicontinued Benazepril.  7. Hypokalemia, repleted. Remains stable. 8. Hypomagnesemia, repleted. Remains stable.    Code Status: Full DVT prophylaxis Heparin Family Communication: Husband at bedside. Discussed with patient who understands and has no concerns at this time. Disposition Plan: Anticipate discharge to rehab facility within 1-2 days.    Consultants:  Wound Care Nurse  Procedures:    Antibiotics:  Zosyn 10/16>>10/20  Vancomycin 10/16>>10/20  Cubicin 10/20>>  HPI/Subjective: Feels pretty good. Leg is feeling good also. Moved bowels today.  Nurse reports that the leg is healing well and looks clean.   Objective: Filed Vitals:   09/25/15 0444  BP: 130/54  Pulse: 84  Temp: 98.1 F (36.7 C)   Resp: 15    Intake/Output Summary (Last 24 hours) at 09/25/15 0715 Last data filed at 09/25/15 0646  Gross per 24 hour  Intake   1660 ml  Output      0 ml  Net   1660 ml   Filed Weights   09/21/15 1932  Weight: 113.399 kg (250 lb)    Exam:  General: NAD. Lying in bed appears calm and looks comfortable. Cardiovascular: RRR, S1, S2  Respiratory: clear bilaterally, No wheezing, rales or rhonchi Abdomen: soft, non tender, no distention , bowel sounds normal Musculoskeletal: Wound VAC over left knee and LE in brace, no pedal edema.  Data Reviewed: Basic Metabolic Panel:  Recent Labs Lab 09/19/15 0832 09/21/15 2031 09/21/15 2330 09/23/15 0634 09/24/15 0629 09/25/15 0601  NA  --  133*  --  137 138 134*  K  --  2.9*  --  3.5 4.3 4.4  CL  --  101  --  104 105 104  CO2  --  23  --  26 24 21*  GLUCOSE  --  194*  --  152* 142* 154*  BUN 16 15  --  CREATININE 0.91 0.97  --  1.01* 1.23* 1.46*  CALCIUM  --  8.1*  --  8.3* 8.5* 8.1*  MG  --   --  1.3*  --  1.7  --    Liver Function Tests:  Recent Labs Lab 09/23/15 0634  AST 18  ALT 15  ALKPHOS 59  BILITOT 0.4  PROT 6.4*  ALBUMIN 2.2*    CBC:  Recent Labs Lab 09/21/15 2031 09/23/15 0634 09/24/15 0629 09/25/15 0601  WBC 10.0 10.6* 13.9* 15.9*  NEUTROABS 6.0  --   --   --  HGB 8.3* 8.8* 9.6* 9.6*  HCT 25.4* 27.0* 30.4* 30.1*  MCV 78.2 78.0 78.8 79.0  PLT 349 380 451* 450*   Cardiac Enzymes:  Recent Labs Lab 09/21/15 2031  TROPONINI <0.03     CBG:  Recent Labs Lab 09/23/15 2041 09/24/15 0725 09/24/15 1117 09/24/15 1638 09/24/15 2107  GLUCAP 117* 132* 144* 138* 144*    Recent Results (from the past 240 hour(s))  Culture, blood (routine x 2)     Status: None (Preliminary result)   Collection Time: 09/21/15  8:16 PM  Result Value Ref Range Status   Specimen Description BLOOD A-LINE DRAW DRAWN BY RN  Final   Special Requests BOTTLES DRAWN AEROBIC AND ANAEROBIC 6CC  Final   Culture   Setup Time   Final    GRAM POSITIVE COCCI IN CLUSTERS RECOVERED FROM THE ANAEROBIC BOTTLE Gram Stain Report Called to,Read Back By and Verified With: COVINGTON,L. AT 1414 ON 09/24/2015 BY BAUGHAM,M. Performed at [facility]    Culture PENDING  Incomplete   Report Status PENDING  Incomplete  Culture, blood (routine x 2)     Status: None (Preliminary result)   Collection Time: 09/21/15 11:30 PM  Result Value Ref Range Status   Specimen Description BLOOD RIGHT ANTECUBITAL  Final   Special Requests BOTTLES DRAWN AEROBIC AND ANAEROBIC 6CC  Final   Culture NO GROWTH 3 DAYS  Final   Report Status PENDING  Incomplete  Urine culture     Status: None   Collection Time: 09/21/15 11:53 PM  Result Value Ref Range Status   Specimen Description URINE, CLEAN CATCH  Final   Special Requests NONE  Final   Culture   Final    MULTIPLE SPECIES PRESENT, SUGGEST RECOLLECTION [facility]formed [facility]    Report Status 09/23/2015 FINAL  Final  MRSA PCR Screening     Status: None   Collection Time: 09/22/15  1:52 PM  Result Value Ref Range Status   MRSA by PCR NEGATIVE NEGATIVE Final    Comment:        The GeneXpert MRSA Assay (FDA approved for NASAL specimens only), is one component of a comprehensive MRSA colonization surveillance program. It is not intended to diagnose MRSA infection nor to guide or monitor treatment for MRSA infections.      Studies: No results found.  Scheduled Meds: . amLODipine  10 mg Oral Daily  . atenolol  50 mg Oral Daily  . benazepril  20 mg Oral Daily  . Chlorhexidine Gluconate Cloth  6 each Topical Q0600  . collagenase   Topical Daily  . ferrous sulfate  325 mg Oral Q breakfast  . heparin  5,000 Units Subcutaneous 3 times per day  . imipramine  50 mg Oral TID  . insulin aspart  0-5 Units Subcutaneous QHS  . insulin aspart  0-9 Units Subcutaneous TID WC  . linagliptin  5 mg Oral Daily  . metFORMIN  1,000 mg Oral BID WC  . mupirocin ointment   1 application Nasal BID  . piperacillin-tazobactam  3.375 g Intravenous Q8H  . potassium chloride SA  20 mEq Oral BID  . pravastatin  10 mg Oral q1800  . vancomycin  1,750 mg Intravenous Q24H   Continuous Infusions: . sodium chloride 100 mL/hr at 09/24/15 1212    Principal Problem:   HCAP (healthcare-associated pneumonia) Active Problems:   Type II diabetes mellitus (HCC)   Essential hypertension, benign   Obesity   Microcytic anemia   Wound  dehiscence, surgical   Hypokalemia   Decubitus ulcer of left buttock, unstageable (HCC)    Time spent: 25 minutes  Matthewjames Petrasek. MD  [facility] Pager [phone]. If 7PM-7AM, please contact night-coverage at [email or web address], password [facility] 09/25/2015, 7:15 AM  LOS: 3 days      By signing my name below, I, Zadie Cleverly, attest that this documentation has been prepared under the direction and in the presence of Erick Blinks, MD. Electronically signed: Zadie Cleverly, Scribe. 09/25/2015 1:32 PM  I, Dr. Erick Blinks, personally performed the services described in this documentaiton. All medical record entries made by the scribe were at my direction and in my presence. I have reviewed the chart and agree that the record reflects my personal performance and is accurate and complete  Erick Blinks, MD, 09/25/2015 1:57 PM

## 2015-09-26 ENCOUNTER — Inpatient Hospital Stay (HOSPITAL_COMMUNITY): Payer: Commercial Managed Care - HMO

## 2015-09-26 DIAGNOSIS — N179 Acute kidney failure, unspecified: Secondary | ICD-10-CM | POA: Diagnosis not present

## 2015-09-26 DIAGNOSIS — R7881 Bacteremia: Secondary | ICD-10-CM | POA: Diagnosis present

## 2015-09-26 DIAGNOSIS — R509 Fever, unspecified: Secondary | ICD-10-CM

## 2015-09-26 LAB — BASIC METABOLIC PANEL
ANION GAP: 11 (ref 5–15)
BUN: 23 mg/dL — ABNORMAL HIGH (ref 6–20)
CHLORIDE: 104 mmol/L (ref 101–111)
CO2: 19 mmol/L — AB (ref 22–32)
Calcium: 8.1 mg/dL — ABNORMAL LOW (ref 8.9–10.3)
Creatinine, Ser: 1.63 mg/dL — ABNORMAL HIGH (ref 0.44–1.00)
GFR calc Af Amer: 35 mL/min — ABNORMAL LOW (ref >60)
GFR calc non Af Amer: 30 mL/min — ABNORMAL LOW (ref >60)
GLUCOSE: 158 mg/dL — AB (ref 65–99)
POTASSIUM: 4.7 mmol/L (ref 3.5–5.1)
Sodium: 134 mmol/L — ABNORMAL LOW (ref 135–145)

## 2015-09-26 LAB — CBC
HEMATOCRIT: 28.4 % — AB (ref 36.0–46.0)
HEMOGLOBIN: 9.1 g/dL — AB (ref 12.0–15.0)
MCH: 25.5 pg — AB (ref 26.0–34.0)
MCHC: 32 g/dL (ref 30.0–36.0)
MCV: 79.6 fL (ref 78.0–100.0)
Platelets: 430 10*3/uL — ABNORMAL HIGH (ref 150–400)
RBC: 3.57 MIL/uL — AB (ref 3.87–5.11)
RDW: 16.4 % — ABNORMAL HIGH (ref 11.5–15.5)
WBC: 17.7 10*3/uL — ABNORMAL HIGH (ref 4.0–10.5)

## 2015-09-26 LAB — BRAIN NATRIURETIC PEPTIDE: B Natriuretic Peptide: 48 pg/mL (ref 0.0–100.0)

## 2015-09-26 LAB — GLUCOSE, CAPILLARY
GLUCOSE-CAPILLARY: 179 mg/dL — AB (ref 65–99)
GLUCOSE-CAPILLARY: 131 mg/dL — AB (ref 65–99)
Glucose-Capillary: 146 mg/dL — ABNORMAL HIGH (ref 65–99)
Glucose-Capillary: 191 mg/dL — ABNORMAL HIGH (ref 65–99)

## 2015-09-26 MED ORDER — SODIUM CHLORIDE 0.9 % IV SOLN
INTRAVENOUS | Status: DC
Start: 2015-09-26 — End: 2015-09-26
  Administered 2015-09-26: 07:00:00 via INTRAVENOUS

## 2015-09-26 MED ORDER — CEFAZOLIN SODIUM-DEXTROSE 2-3 GM-% IV SOLR
2.0000 g | Freq: Three times a day (TID) | INTRAVENOUS | Status: DC
  Administered 2015-09-26 – 2015-09-27 (×3): 2 g via INTRAVENOUS
  Filled 2015-09-26 (×10): qty 50

## 2015-09-26 MED ORDER — FUROSEMIDE 10 MG/ML IJ SOLN
20.0000 mg | Freq: Once | INTRAMUSCULAR | Status: AC
  Administered 2015-09-26: 20 mg via INTRAVENOUS
  Filled 2015-09-26: qty 2

## 2015-09-26 NOTE — Progress Notes (Signed)
ANTIBIOTIC CONSULT NOTE- follow up  Pharmacy Consult for Daptomycin and Cefazolin Indication: pneumonia / bacteremia  No Known Allergies  Patient Measurements: Height:  (167.6 cm) Weight: 250 lb (113.399 kg) IBW/kg (Calculated) : 59.3  Vital Signs: Temp: 98 F (36.7 C) (10/21 0618) Temp Source: Oral (10/21 0618) BP: 126/50 mmHg (10/21 1018) Pulse Rate: 83 (10/21 0956)  Labs:  Recent Labs  09/24/15 0629 09/25/15 0601 09/26/15 0644  WBC 13.9* 15.9* 17.7*  HGB 9.6* 9.6* 9.1*  PLT 451* 450* 430*  CREATININE 1.23* 1.46* 1.63*   Estimated Creatinine Clearance: 39.3 mL/min (by C-G formula based on Cr of 1.63).  No results for input(s): VANCOTROUGH, VANCOPEAK, VANCORANDOM, GENTTROUGH, GENTPEAK, GENTRANDOM, TOBRATROUGH, TOBRAPEAK, TOBRARND, AMIKACINPEAK, AMIKACINTROU, AMIKACIN in the last 72 hours.  Microbiology: Recent Results (from the past 720 hour(s))  Culture, blood (routine x 2)     Status: None (Preliminary result)   Collection Time: 09/21/15  8:16 PM  Result Value Ref Range Status   Specimen Description BLOOD A-LINE DRAW DRAWN BY RN  Final   Special Requests BOTTLES DRAWN AEROBIC AND ANAEROBIC 6CC  Final   Culture  Setup Time   Final    GRAM POSITIVE COCCI IN CLUSTERS RECOVERED FROM THE ANAEROBIC BOTTLE Gram Stain Report Called to,Read Back By and Verified With: COVINGTON,L. AT 1414 ON 09/24/2015 BY BAUGHAM,M. Performed at [facility]    Culture   Final    STAPHYLOCOCCUS AUREUS Performed at [facility]    Report Status PENDING  Incomplete  Culture, blood (routine x 2)     Status: None   Collection Time: 09/21/15 11:30 PM  Result Value Ref Range Status   Specimen Description BLOOD RIGHT ANTECUBITAL  Final   Special Requests BOTTLES DRAWN AEROBIC AND ANAEROBIC 6CC  Final   Culture  Setup Time   Final    GRAM POSITIVE COCCI IN CLUSTERS RECOVERED FROM THE AEROBIC BOTTLE Gram Stain Report Called to,Read Back By and Verified With: DAVIS,L. AT  1822 ON 09/25/2015 BY BAUGHAM,M. Performed at [facility]    Culture NO GROWTH 5 DAYS  Final   Report Status 09/26/2015 FINAL  Final  Urine culture     Status: None   Collection Time: 09/21/15 11:53 PM  Result Value Ref Range Status   Specimen Description URINE, CLEAN CATCH  Final   Special Requests NONE  Final   Culture   Final    MULTIPLE SPECIES PRESENT, SUGGEST RECOLLECTION Performed at [facility]    Report Status 09/23/2015 FINAL  Final  MRSA PCR Screening     Status: None   Collection Time: 09/22/15  1:52 PM  Result Value Ref Range Status   MRSA by PCR NEGATIVE NEGATIVE Final    Comment:        The GeneXpert MRSA Assay (FDA approved for NASAL specimens only), is one component of a comprehensive MRSA colonization surveillance program. It is not intended to diagnose MRSA infection nor to guide or monitor treatment for MRSA infections.    Medical History: Past Medical History  Diagnosis Date  . Diabetes mellitus     takes Januvia,Metformin,and Glipizide daily  . Hyperlipidemia     takes Lovastatin nightly  . Anemia     takes Ferrous Gluconate daily  . Peripheral edema     takes Furosemide daily as needed  . Depression     takes Tofranil daily  . Hypertension     takes Amlodipine,Atenolol,and Benazepril daily  . Arthritis   .  Joint pain   . Joint swelling   . Cataracts, bilateral     immature  . Infection of skin of knee     left   Medications:  Scheduled:  . amLODipine  10 mg Oral Daily  . atenolol  50 mg Oral Daily  .  ceFAZolin (ANCEF) IV  2 g Intravenous 3 times per day  . Chlorhexidine Gluconate Cloth  6 each Topical Q0600  . collagenase   Topical Daily  . DAPTOmycin (CUBICIN)  IV  700 mg Intravenous Q24H  . ferrous sulfate  325 mg Oral Q breakfast  . heparin  5,000 Units Subcutaneous 3 times per day  . imipramine  50 mg Oral TID  . insulin aspart  0-5 Units Subcutaneous QHS  . insulin aspart  0-9 Units Subcutaneous TID WC  .  linagliptin  5 mg Oral Daily  . potassium chloride SA  20 mEq Oral BID  . pravastatin  10 mg Oral q1800   Assessment: 74 yo female being admitted for HCAP (Cxray showed new infiltrate). Patient presented to ED with chills, and subjective fever. In addition, she is s/p knee surgery which she had a wound dehiscence and was infected with MRSA. Has been on Vancomycin since 08/21/15 and was scheduled to end 09/26/15. She has had debridements and wound vac in place and sent to skilled nursing facility 09/16/15.  During this admission pt has been on Vancomycin and Zosyn.  Now switching to Daptomycin and Ancef per Dr's Memon / Daiva EvesVan Dam (ID svc). SCr elevated, rising since admission,  Normalized clcr ~ 35-40 ml/min  Goal of Therapy:  Eradicate infection  Plan:  Daptomycin 700mg  IV q24hrs (~ 6mg /Kg per dose) Ancef 2gm IV q8h Monitor labs, renal fxn, progress and f/u ID sensitivities If renal fxn worsens (clcr < 30) will switch Dapto to q48hr dosing.   Valrie HartScott Stella Encarnacion, PharmD 09/26/2015,1:06 PM

## 2015-09-26 NOTE — Progress Notes (Signed)
Patient has orders to be transferred to [facility] tonight for further evaluation. Both patient and her husband have some concerns and have decided that they do not want to be transferred to [facility]. Contacted Dr. Sharl MaLama who came up to the floor to speak with the patient and her husband. After Dr. Sharl MaLama spoke with them, they told him that they would be willing to transfer tonight but once Dr. Sharl MaLama left the floor, the patient and her husband decided that they do not want to be transferred tonight and would rather transfer tomorrow. Paged Dr. Sharl MaLama to inform him of this.

## 2015-09-26 NOTE — Care Management Important Message (Signed)
Important Message  Patient Details  Name: Nani SkillernHelen M Ferber MRN: [id number] Date of Birth: 05/05/1941   Medicare Important Message Given:  Yes-second notification given    Cheryl FlashBlackwell, Doraine Schexnider Crowder, RN 09/26/2015, 11:05 AM

## 2015-09-26 NOTE — Progress Notes (Signed)
Called by RN that patient and her husband did not want to transfer tonight. Talked to patient and her husband at bedside, and explained that she needs to be evaluated by orthopedic surgeon and infectious disease to find the source of infection. Both patient and husband agreed for transfer tonight, later RN called me and told that they want to transfer tomorrow.

## 2015-09-26 NOTE — Progress Notes (Signed)
Pt BP 103/30. Spoke with Dr.Lama who stated the change the .45 normal saline to .9 normal saline. Will continue to monitor

## 2015-09-26 NOTE — Clinical Social Work Note (Signed)
CSW updated [facility] on pt. Possible weekend d/c. CSW spoke with Michelle with [facility]([employer]). Will need to contact weekend line for authorization if ready.   Derenda FennelKara Hollis Tuller, LCSW [phone]

## 2015-09-26 NOTE — Care Management Note (Signed)
Case Management Note  Patient Details  Name: Nani SkillernHelen M Lowdermilk MRN: [id number] Date of Birth: 08/03/1941  Subjective/Objective:                    Action/Plan:   Expected Discharge Date:                  Expected Discharge Plan:  Skilled Nursing Facility  In-House Referral:  Clinical Social Work  Discharge planning Services  CM Consult  Post Acute Care Choice:  NA Choice offered to:  NA  DME Arranged:    DME Agency:     HH Arranged:    HH Agency:     Status of Service:  Completed, signed off  Medicare Important Message Given:    Date Medicare IM Given:    Medicare IM give by:    Date Additional Medicare IM Given:    Additional Medicare Important Message give by:     If discussed at Long Length of Stay Meetings, dates discussed:    Additional Comments: Pt kidney function declining. Pts IVF changed today. Anticipate pt to discharge over the weekend back to [facility] when kidney function is stable. Arlyss QueenBlackwell, Jaymason Ledesma Sleepy Eyerowder, RN 09/26/2015, 11:04 AM

## 2015-09-26 NOTE — Progress Notes (Signed)
[facility] PROGRESS NOTE  Lindsay SkillernHelen M Sefcik ZOX:[id number] DOB: 11/27/1941 DOA: 09/21/2015 PCP: Milinda AntisURHAM, KAWANTA, MD  Summary:  74 year old patient who was recently discharged from [facility] on 10/11 after undergoing incision and drainage of left knee by Dr. Roda ShuttersXu. Wound cultures at that time indicated MRSA. She was discharged with a PICC line on vancomycin to skilled nursing facility. She'll return to the hospital with fevers and questionable pneumonia. She was continued on vancomycin and started on Zosyn. Blood cultures returned positive for Staphylococcus aureus. Discussed with infectious disease who recommended transfer to [facility] for orthopedic evaluation of left knee since it is felt that this is the likely source of her bacteremia. PICC line has been removed and echocardiogram has been ordered. Vancomycin was discontinued in favor of daptomycin due to rising creatinine. She will need a total of 6-8 weeks of antibiotics. Discussed with Dr. Lajoyce Cornersuda who agreed with transfer. May need official infectious disease consult if she fails to improve.  Assessment/Plan: 1. HCAP, revealed on CXR 10/16. Remains afebrile. WBC trending up to 17.7. Blood cultures show 2/2 staphylococcal aureus.  Continue abx.  Overall, she has had minimal cough. She is more short of breath today, but appears to be volume overloaded. Checking chest xray and bnp.  2. Staphylococcal bacteremia, possibly due to infection in left knee.  Blood cultures show 2/2 staphylococcal aureus.  Discussed with Dr. Algis LimingVanDam, infectious disease, who recommended changing vancomycin to daptomycin in light of worsening creatinine. She has also been started on Ancef until culture sensitivities are available. Repeat cultures pending.  Will remove PICC line and order ECHO. She will need 6-8 weeks of antibiotics. Dr. Algis LimingVanDam also recommended orthopedic evaluation of left knee to see if further debridement is necessary, since he strongly feels that this is  the source of bacteremia. Discussed with Dr. Lajoyce Cornersuda who agrees with transfer. If she fails to improve, may need official ID consult .  3. Wound dehiscence/infection left knee with recent hospitalization from 10/5-10/11 for similar. Surgical debridement performed by Dr. Roda ShuttersXu on 10/7 with subsequent wound vac. Wound cultures at that time were positive for MRSA. Wound care nurse changed dressing on 10/17. Recommendations on last discharge were for 6 weeks of IV vancomycin. Her knee will need to be further evaluated in light of bacteremia.   4. Microcytic anemia, likely from subacute perioperative blood loss/blood loss from wound VAC. Remains stable with Hgb 9.1. 5. AKI. Creatinine 1.63 today. Since she is showing signs of volume overload, will discontinue IVF and give one dose of lasix. Continue to monitor UOP. Discontinued ACE-I and Vancomycin given creatinine continues to rise. Continue to follow renal function closely  6. DM type 2, stable. Continue SSI 7. Essential HTN, stable. Continue Amlodipine and atenolol. Discontinued Benazepril.  8. Hypokalemia, repleted. Remains stable. 9. Hypomagnesemia, repleted. Remains stable.    Code Status: Full DVT prophylaxis Heparin Family Communication: Husband at bedside.  Discussed with patient who understands and has no concerns at this time. Disposition Plan: Transfer to [facility] for further evaluation.    Consultants:  Wound Care Nurse  Procedures:    Antibiotics:  Zosyn 10/16>>10/20  Vancomycin 10/16>>10/20  Cubicin 10/20>>  Ancef 10/21>>  HPI/Subjective: Feels okay. Denies any SOB or CP. Left knee is sore.   Objective: Filed Vitals:   09/26/15 0618  BP: 103/30  Pulse: 80  Temp: 98 F (36.7 C)  Resp: 20    Intake/Output Summary (Last 24 hours) at 09/26/15 0745 Last data filed at 09/26/15 0700  Gross per 24 hour  Intake 2017.33 ml  Output    165 ml  Net 1852.33 ml   Filed Weights   09/21/15 1932  Weight: 113.399 kg (250  lb)    Exam:  General: NAD. Appears mildly SOB Cardiovascular: RRR, S1, S2  Respiratory: Diminished breath sounds with crackles at the bases. No wheezing  or rhonchi Skin: 3cmx5cm stage 3 sacral decubitus ulcer without surrounding erythema or foul smelling discharge Abdomen: soft, non tender, no distention , bowel sounds normal Musculoskeletal: Wound VAC over left knee. Surrounding skin is warm. No clear erythema. 1+ edema BLE.  Data Reviewed: Basic Metabolic Panel:  Recent Labs Lab 09/19/15 0832 09/21/15 2031 09/21/15 2330 09/23/15 0634 09/24/15 0629 09/25/15 0601  NA  --  133*  --  137 138 134*  K  --  2.9*  --  3.5 4.3 4.4  CL  --  101  --  104 105 104  CO2  --  23  --  26 24 21*  GLUCOSE  --  194*  --  152* 142* 154*  BUN 16 15  --  CREATININE 0.91 0.97  --  1.01* 1.23* 1.46*  CALCIUM  --  8.1*  --  8.3* 8.5* 8.1*  MG  --   --  1.3*  --  1.7  --    Liver Function Tests:  Recent Labs Lab 09/23/15 0634  AST 18  ALT 15  ALKPHOS 59  BILITOT 0.4  PROT 6.4*  ALBUMIN 2.2*    CBC:  Recent Labs Lab 09/21/15 2031 09/23/15 0634 09/24/15 0629 09/25/15 0601 09/26/15 0644  WBC 10.0 10.6* 13.9* 15.9* 17.7*  NEUTROABS 6.0  --   --   --   --   HGB 8.3* 8.8* 9.6* 9.6* 9.1*  HCT 25.4* 27.0* 30.4* 30.1* 28.4*  MCV 78.2 78.0 78.8 79.0 79.6  PLT 349 380 451* 450* 430*   Cardiac Enzymes:  Recent Labs Lab 09/21/15 2031  TROPONINI <0.03     CBG:  Recent Labs Lab 09/24/15 2107 09/25/15 0732 09/25/15 1113 09/25/15 1618 09/25/15 2028  GLUCAP 144* 148* 149* 187* 162*    Recent Results (from the past 240 hour(s))  Culture, blood (routine x 2)     Status: None (Preliminary result)   Collection Time: 09/21/15  8:16 PM  Result Value Ref Range Status   Specimen Description BLOOD A-LINE DRAW DRAWN BY RN  Final   Special Requests BOTTLES DRAWN AEROBIC AND ANAEROBIC 6CC  Final   Culture  Setup Time   Final    GRAM POSITIVE COCCI IN CLUSTERS RECOVERED  FROM THE ANAEROBIC BOTTLE Gram Stain Report Called to,Read Back By and Verified With: COVINGTON,L. AT 1414 ON 09/24/2015 BY BAUGHAM,M. Performed at [facility]    Culture   Final    GRAM POSITIVE COCCI CULTURE REINCUBATED FOR BETTER GROWTH Performed at [facility]    Report Status PENDING  Incomplete  Culture, blood (routine x 2)     Status: None   Collection Time: 09/21/15 11:30 PM  Result Value Ref Range Status   Specimen Description BLOOD RIGHT ANTECUBITAL  Final   Special Requests BOTTLES DRAWN AEROBIC AND ANAEROBIC 6CC  Final   Culture  Setup Time   Final    GRAM POSITIVE COCCI IN CLUSTERS RECOVERED FROM THE AEROBIC BOTTLE Gram Stain Report Called to,Read Back By and Verified With: DAVIS,L. AT 1822 ON 09/25/2015 BY BAUGHAM,M. Performed at [facility]  Culture NO GROWTH 5 DAYS  Final   Report Status 09/26/2015 FINAL  Final  Urine culture     Status: None   Collection Time: 09/21/15 11:53 PM  Result Value Ref Range Status   Specimen Description URINE, CLEAN CATCH  Final   Special Requests NONE  Final   Culture   Final    MULTIPLE SPECIES PRESENT, SUGGEST RECOLLECTION Performed at [facility]    Report Status 09/23/2015 FINAL  Final  MRSA PCR Screening     Status: None   Collection Time: 09/22/15  1:52 PM  Result Value Ref Range Status   MRSA by PCR NEGATIVE NEGATIVE Final    Comment:        The GeneXpert MRSA Assay (FDA approved for NASAL specimens only), is one component of a comprehensive MRSA colonization surveillance program. It is not intended to diagnose MRSA infection nor to guide or monitor treatment for MRSA infections.      Studies: No results found.  Scheduled Meds: . amLODipine  10 mg Oral Daily  . atenolol  50 mg Oral Daily  . Chlorhexidine Gluconate Cloth  6 each Topical Q0600  . collagenase   Topical Daily  . DAPTOmycin (CUBICIN)  IV  700 mg Intravenous Q24H  . ferrous sulfate  325 mg Oral Q breakfast  .  heparin  5,000 Units Subcutaneous 3 times per day  . imipramine  50 mg Oral TID  . insulin aspart  0-5 Units Subcutaneous QHS  . insulin aspart  0-9 Units Subcutaneous TID WC  . linagliptin  5 mg Oral Daily  . mupirocin ointment  1 application Nasal BID  . potassium chloride SA  20 mEq Oral BID  . pravastatin  10 mg Oral q1800   Continuous Infusions: . sodium chloride 100 mL/hr at 09/26/15 0630    Principal Problem:   HCAP (healthcare-associated pneumonia) Active Problems:   Type II diabetes mellitus (HCC)   Essential hypertension, benign   Obesity   Microcytic anemia   Wound dehiscence, surgical   Hypokalemia   Decubitus ulcer of left buttock, unstageable (HCC)   Absolute anemia    Time spent: 40 minutes  Jehanzeb Memon. MD  [facility] Pager [phone]. If 7PM-7AM, please contact night-coverage at [email or web address], password [email or web address] 09/26/2015, 7:45 AM  LOS: 4 days      By signing my name below, I, Burnett Harry, attest that this documentation has been prepared under the direction and in the presence of [facility] MD Electronically Signed: Burnett Harry, Scribe. 09/26/2015 1:48pm  I, Dr. Erick Blinks, personally performed the services described in this documentaiton. All medical record entries made by the scribe were at my direction and in my presence. I have reviewed the chart and agree that the record reflects my personal performance and is accurate and complete  Erick Blinks, MD, 09/26/2015 6:39 PM

## 2015-09-26 NOTE — Progress Notes (Addendum)
INFECTIOUS DISEASE ATTENDING ADDENDUM:           [facility]   Date: 09/26/2015  Patient name: Lindsay Cobb  Medical record number: [id number]  Date of birth: 03/20/1941    [facility] "AUTO CONSULT" NOTE FOR STAPHYLOCOCCUS AUREUS BACTEREMIA       [place] [facility] Staphylococcus aureus bacteremia   Staphylococcus aureus bacteremia (SAB) is associated with a high rate of complications and mortality.  Specific aspects of clinical management are critical to optimizing the outcome of patients with SAB.  Therefore, the [facility] Antimicrobial Management Team [facility]) has initiated an intervention aimed at improving the management of SAB at [facility].  To do so, Infectious Diseases physicians are providing an evidence-based consult for the management of all patients with SAB.     Yes No Comments  Perform follow-up blood cultures (even if the patient is afebrile) to ensure clearance of bacteremia [x]  []   ordered today but also need to be repeated after her central line has been removed   Remove vascular catheter and obtain follow-up blood cultures after the removal of the catheter [x]  []   DC PICC line   Perform echocardiography to evaluate for endocarditis (transthoracic ECHO is 40-50% sensitive, TEE is > 90% sensitive) [x]  []  Please keep in mind, that neither test can definitively EXCLUDE endocarditis, and that should clinical suspicion remain high for endocarditis the patient should then still be treated with an "endocarditis" duration of therapy = 6 weeks  Consult electrophysiologist to evaluate implanted cardiac device (pacemaker, ICD) []  []   NA   Ensure source control []  []  Have all abscesses been drained effectively? Have deep seeded infections (septic joints or osteomyelitis) had appropriate surgical debridement?  NO! She needs to be seen by Orthopedics and have surgery on her knee, it is undoubtedly the source of her bacteremia    Investigate for "metastatic" sites of infection []  []  Does the patient have ANY symptom or physical exam finding that would suggest a deeper infection (back or neck pain that may be suggestive of vertebral osteomyelitis or epidural abscess, muscle pain that could be a symptom of pyomyositis)?  Keep in mind that for deep seeded infections MRI imaging with contrast is preferred rather than other often insensitive tests such as plain x-rays, especially early in a patient's presentation.  Change antibiotic therapy to daptomycin and cefazolin pending sensis []  []  Beta-lactam antibiotics are preferred for MSSA due to higher cure rates.   If on Vancomycin, goal trough should be 15 - 20 mcg/mL  Estimated duration of IV antibiotic therapy:    6-8 weeks after she has had another surgery on her knee  []  []  Consult case management for probably prolonged outpatient IV antibiotic therapy     Case discussed with Dr. Kerry HoughMemon.  Acey LavCornelius Van Dam 09/26/2015, 5:17 PM

## 2015-09-27 ENCOUNTER — Telehealth: Payer: Self-pay | Admitting: Infectious Disease

## 2015-09-27 DIAGNOSIS — M86651 Other chronic osteomyelitis, right thigh: Secondary | ICD-10-CM | POA: Insufficient documentation

## 2015-09-27 DIAGNOSIS — R7881 Bacteremia: Secondary | ICD-10-CM | POA: Insufficient documentation

## 2015-09-27 DIAGNOSIS — M86261 Subacute osteomyelitis, right tibia and fibula: Secondary | ICD-10-CM | POA: Insufficient documentation

## 2015-09-27 DIAGNOSIS — M8618 Other acute osteomyelitis, other site: Secondary | ICD-10-CM

## 2015-09-27 DIAGNOSIS — M00869 Arthritis due to other bacteria, unspecified knee: Secondary | ICD-10-CM | POA: Diagnosis present

## 2015-09-27 DIAGNOSIS — B9562 Methicillin resistant Staphylococcus aureus infection as the cause of diseases classified elsewhere: Secondary | ICD-10-CM

## 2015-09-27 DIAGNOSIS — N179 Acute kidney failure, unspecified: Secondary | ICD-10-CM

## 2015-09-27 DIAGNOSIS — A4101 Sepsis due to Methicillin susceptible Staphylococcus aureus: Secondary | ICD-10-CM | POA: Insufficient documentation

## 2015-09-27 DIAGNOSIS — J9811 Atelectasis: Secondary | ICD-10-CM | POA: Diagnosis not present

## 2015-09-27 DIAGNOSIS — B9561 Methicillin susceptible Staphylococcus aureus infection as the cause of diseases classified elsewhere: Secondary | ICD-10-CM

## 2015-09-27 DIAGNOSIS — M009 Pyogenic arthritis, unspecified: Secondary | ICD-10-CM

## 2015-09-27 LAB — CBC WITH DIFFERENTIAL/PLATELET
BASOS PCT: 0 %
Basophils Absolute: 0 10*3/uL (ref 0.0–0.1)
EOS PCT: 30 %
Eosinophils Absolute: 6.6 10*3/uL — ABNORMAL HIGH (ref 0.0–0.7)
HCT: 28.7 % — ABNORMAL LOW (ref 36.0–46.0)
HEMOGLOBIN: 9.6 g/dL — AB (ref 12.0–15.0)
LYMPHS PCT: 7 %
Lymphs Abs: 1.5 10*3/uL (ref 0.7–4.0)
MCH: 25.9 pg — AB (ref 26.0–34.0)
MCHC: 33.4 g/dL (ref 30.0–36.0)
MCV: 77.6 fL — AB (ref 78.0–100.0)
MONO ABS: 1.1 10*3/uL — AB (ref 0.1–1.0)
Monocytes Relative: 5 %
NEUTROS ABS: 12.9 10*3/uL — AB (ref 1.7–7.7)
Neutrophils Relative %: 58 %
Platelets: 405 10*3/uL — ABNORMAL HIGH (ref 150–400)
RBC: 3.7 MIL/uL — ABNORMAL LOW (ref 3.87–5.11)
RDW: 16.7 % — ABNORMAL HIGH (ref 11.5–15.5)
WBC: 22.1 10*3/uL — AB (ref 4.0–10.5)

## 2015-09-27 LAB — GLUCOSE, CAPILLARY
GLUCOSE-CAPILLARY: 166 mg/dL — AB (ref 65–99)
GLUCOSE-CAPILLARY: 180 mg/dL — AB (ref 65–99)
GLUCOSE-CAPILLARY: 209 mg/dL — AB (ref 65–99)
Glucose-Capillary: 159 mg/dL — ABNORMAL HIGH (ref 65–99)

## 2015-09-27 LAB — COMPREHENSIVE METABOLIC PANEL
ALK PHOS: 68 U/L (ref 38–126)
ALT: 13 U/L — AB (ref 14–54)
ANION GAP: 7 (ref 5–15)
AST: 26 U/L (ref 15–41)
Albumin: 1.8 g/dL — ABNORMAL LOW (ref 3.5–5.0)
BUN: 27 mg/dL — ABNORMAL HIGH (ref 6–20)
CALCIUM: 8.2 mg/dL — AB (ref 8.9–10.3)
CO2: 19 mmol/L — ABNORMAL LOW (ref 22–32)
CREATININE: 1.87 mg/dL — AB (ref 0.44–1.00)
Chloride: 103 mmol/L (ref 101–111)
GFR, EST AFRICAN AMERICAN: 30 mL/min — AB (ref >60)
GFR, EST NON AFRICAN AMERICAN: 26 mL/min — AB (ref >60)
Glucose, Bld: 211 mg/dL — ABNORMAL HIGH (ref 65–99)
Potassium: 5.6 mmol/L — ABNORMAL HIGH (ref 3.5–5.1)
Sodium: 129 mmol/L — ABNORMAL LOW (ref 135–145)
TOTAL PROTEIN: 4.9 g/dL — AB (ref 6.5–8.1)
Total Bilirubin: 0.4 mg/dL (ref 0.3–1.2)

## 2015-09-27 LAB — BASIC METABOLIC PANEL
Anion gap: 9 (ref 5–15)
BUN: 27 mg/dL — ABNORMAL HIGH (ref 6–20)
CALCIUM: 8.2 mg/dL — AB (ref 8.9–10.3)
CO2: 19 mmol/L — AB (ref 22–32)
CREATININE: 1.76 mg/dL — AB (ref 0.44–1.00)
Chloride: 105 mmol/L (ref 101–111)
GFR calc non Af Amer: 28 mL/min — ABNORMAL LOW (ref >60)
GFR, EST AFRICAN AMERICAN: 32 mL/min — AB (ref >60)
Glucose, Bld: 170 mg/dL — ABNORMAL HIGH (ref 65–99)
Potassium: 4.8 mmol/L (ref 3.5–5.1)
Sodium: 133 mmol/L — ABNORMAL LOW (ref 135–145)

## 2015-09-27 LAB — CK: Total CK: 21 U/L — ABNORMAL LOW (ref 38–234)

## 2015-09-27 LAB — CULTURE, BLOOD (ROUTINE X 2)

## 2015-09-27 LAB — CBC
HCT: 30 % — ABNORMAL LOW (ref 36.0–46.0)
Hemoglobin: 9.5 g/dL — ABNORMAL LOW (ref 12.0–15.0)
MCH: 25.1 pg — AB (ref 26.0–34.0)
MCHC: 31.7 g/dL (ref 30.0–36.0)
MCV: 79.2 fL (ref 78.0–100.0)
PLATELETS: 419 10*3/uL — AB (ref 150–400)
RBC: 3.79 MIL/uL — AB (ref 3.87–5.11)
RDW: 16.7 % — ABNORMAL HIGH (ref 11.5–15.5)
WBC: 20.6 10*3/uL — ABNORMAL HIGH (ref 4.0–10.5)

## 2015-09-27 LAB — TYPE AND SCREEN
ABO/RH(D): O POS
Antibody Screen: NEGATIVE

## 2015-09-27 LAB — PROTIME-INR
INR: 1.47 (ref 0.00–1.49)
PROTHROMBIN TIME: 17.9 s — AB (ref 11.6–15.2)

## 2015-09-27 LAB — ABO/RH: ABO/RH(D): O POS

## 2015-09-27 MED ORDER — BENZONATATE 100 MG PO CAPS
100.0000 mg | ORAL_CAPSULE | Freq: Three times a day (TID) | ORAL | Status: DC
  Administered 2015-09-27 – 2015-09-29 (×6): 100 mg via ORAL
  Filled 2015-09-27 (×8): qty 1

## 2015-09-27 MED ORDER — ALBUTEROL SULFATE (2.5 MG/3ML) 0.083% IN NEBU
2.5000 mg | INHALATION_SOLUTION | Freq: Four times a day (QID) | RESPIRATORY_TRACT | Status: DC
  Administered 2015-09-27 (×3): 2.5 mg via RESPIRATORY_TRACT
  Filled 2015-09-27 (×2): qty 3

## 2015-09-27 MED ORDER — ALBUTEROL SULFATE (2.5 MG/3ML) 0.083% IN NEBU: 2.5000 mg | INHALATION_SOLUTION | Freq: Four times a day (QID) | RESPIRATORY_TRACT | Status: DC | PRN

## 2015-09-27 MED ORDER — LORATADINE 10 MG PO TABS
10.0000 mg | ORAL_TABLET | Freq: Every day | ORAL | Status: DC
  Administered 2015-09-27 – 2015-09-29 (×3): 10 mg via ORAL
  Filled 2015-09-27 (×4): qty 1

## 2015-09-27 MED ORDER — SODIUM CHLORIDE 0.45 % IV SOLN
INTRAVENOUS | Status: DC
  Administered 2015-09-27: 21:00:00 via INTRAVENOUS

## 2015-09-27 NOTE — Progress Notes (Addendum)
[facility] PROGRESS NOTE  Lindsay Cobb ZOX:[id number] DOB: 01/23/1941 DOA: 09/21/2015 PCP: Milinda AntisURHAM, KAWANTA, MD  Summary:  74 year old patient who was recently discharged from [facility] on 10/11 after undergoing incision and drainage of Nani Skillernleft knee by Dr. Roda ShuttersXu. Wound cultures at that time indicated MRSA. She was discharged with a PICC line on vancomycin to skilled nursing facility. She return to the hospital on 09/22/15 with fevers and questionable healthcare associated pneumonia. She was continued on vancomycin and started on Zosyn. Blood cultures returned positive for Staphylococcus aureus. On 09/26/15, Dr. Kerry HoughMemon discussed patient with infectious disease who recommended transfer to [facility] for orthopedic evaluation of left knee since it was felt that this is the likely source of her bacteremia. PICC line has been removed and echocardiogram has been ordered. Vancomycin was discontinued in favor of daptomycin due to her rising creatinine. She will need a total of 6-8 weeks of antibiotics. On 09/26/15, Dr. Kerry HoughMemon discussed the patient with Dr. Lajoyce Cornersuda who agreed with transfer. Patient will likely  need official infectious disease consult if she fails to improve.  -Patient discussed with colleague Dr. Gonzella Lexhungel. He will [facility] the [facility] physician at [facility].  Assessment/Plan: HCAP, revealed on CXR 10/16. Patient remains afebrile. WBC trending up to 20.6. Blood cultures show 2/2 staphylococcal aureus.  Patient has some bronchospasms and minimal cough. Yesterday, she was given IV Lasix for presumed volume overload. However, her BNP was only 48. Her follow-up chest x-ray revealed low lung volumes with vascular crowding and bibasilar atelectasis-cannot exclude right basilar infiltrate. Have scheduled albuterol nebulizer. Will add Tessalon Perles for cough.   Staphylococcal bacteremia, possibly due to infection in left knee.  Blood cultures show 2/2 staphylococcal aureus.  Dr. Kerry HoughMemon discussed with Dr. Algis LimingVanDam,  infectious disease, who recommended changing vancomycin to daptomycin in light of worsening creatinine. She had also been started on Ancef until culture sensitivities became available-will discontinue Ancef. Repeat cultures pending.   PICC line was removed.  ECHO ordered, results are pending. She will need 6-8 weeks of antibiotics. Dr. Algis LimingVanDam also recommended orthopedic evaluation of left knee to see if further debridement is necessary, since he strongly felt that it was the source of bacteremia. Dr. Kerry HoughMemon discussed with Dr. Lajoyce Cornersuda who agreed with transfer. If she fails to improve, may need official ID consult. -Note MRI of the knee on 10/21 revealed intense subcutaneous edema and cellulitis about the joint most consistent with infection; marrow edema throughout the patella and in the tibia and femur is most consistent with osteomyelitis; no abscess identified. Wound dehiscence/infection left knee with recent hospitalization from 10/5-10/11 for similar/radiographic evidence of osteomyelitis. Surgical debridement performed by Dr. Roda ShuttersXu on 10/7 with subsequent wound vac. Wound cultures at that time were positive for MRSA. Wound care nurse changed dressing on 10/17. Recommendations on last discharge were for 6 weeks of IV vancomycin. Her knee will need to be further evaluated in light of bacteremia.   Microcytic anemia, likely from subacute perioperative blood loss/blood loss from wound VAC. Remains stable with Hgb 9.109.5 AKI. Creatinine was WNL on admission. It is trending up to 1.76 today. Dr. Kerry HoughMemon felt that she was showing signs of volume overload on 10/21 and the IV fluids were discontinued and she was given 1 dose of Lasix. Her ACE inhibitor was discontinued as was vancomycin. BNP was found to be only 48.  Continue to monitor UOP. Will restart gentle IV fluids with normal saline. If her creatinine does not improve or continues to worsen, consider nephrology consultation.  Etiology unclear, but could be secondary  to ATN from vancomycin or infection.  DM type 2, stable. Continue SSI Essential HTN, stable. Continue Amlodipine and atenolol. Discontinued Benazepril.  Hypokalemia, repleted. Remains stable. Hypomagnesemia, repleted. Remains stable.    Code Status: Full DVT prophylaxis Heparin Family Communication: Discussed with patient and husband. Disposition Plan: Transfer to [name] for further evaluation.    Consultants:  Wound Care Nurse  Procedures:  Echocardiogram, pending  Antibiotics:  Zosyn 10/16>>10/20  Vancomycin 10/16>>10/20  Cubicin 10/20>>  Ancef 10/21>>10/22  HPI/Subjective: Patient denies left knee pain. She does acknowledge some chest congestion and cough, but no chest pain.   Objective: Filed Vitals:   09/27/15 0608  BP: 136/39  Pulse: 80  Temp: 97.8 F (36.6 C)  Resp:     Intake/Output Summary (Last 24 hours) at 09/27/15 0834 Last data filed at 09/26/15 1831  Gross per 24 hour  Intake    770 ml  Output      0 ml  Net    770 ml   Filed Weights   09/21/15 1932  Weight: 113.399 kg (250 lb)    Exam:  General: Obese alert 74 year old African[place] woman in no acute distress. Cardiovascular: RRR, S1, S2  Respiratory: Diminished breath sounds with crackles with occasional wheezes. Skin: (Not examined today). Per Dr. Aliene Altes note on 10/21-- 3cmx5cm stage 3 sacral decubitus ulcer without surrounding erythema or foul smelling  Abdomen: soft, non tender, no distention , bowel sounds normal Musculoskeletal: Wound VAC over left knee with yellow serous fluid in the VAC container. Surrounding skin is warm. No clear erythema. 1+ edema BLE.  Data Reviewed: Basic Metabolic Panel:  Recent Labs Lab 09/21/15 2330 09/23/15 1610 09/24/15 0629 09/25/15 0601 09/26/15 0644 09/27/15 0642  NA  --  137 138 134* 134* 133*  K  --  3.5 4.3 4.4 4.7 4.8  CL  --  104 105 104 104 105  CO2  --  26 24 21* 19* 19*  GLUCOSE  --  152* 142* 154* 158* 170*  BUN  --   13 15 20  23* 27*  CREATININE  --  1.01* 1.23* 1.46* 1.63* 1.76*  CALCIUM  --  8.3* 8.5* 8.1* 8.1* 8.2*  MG 1.3*  --  1.7  --   --   --    Liver Function Tests:  Recent Labs Lab 09/23/15 0634  AST 18  ALT 15  ALKPHOS 59  BILITOT 0.4  PROT 6.4*  ALBUMIN 2.2*    CBC:  Recent Labs Lab 09/21/15 2031 09/23/15 0634 09/24/15 0629 09/25/15 0601 09/26/15 0644 09/27/15 0642  WBC 10.0 10.6* 13.9* 15.9* 17.7* 20.6*  NEUTROABS 6.0  --   --   --   --   --   HGB 8.3* 8.8* 9.6* 9.6* 9.1* 9.5*  HCT 25.4* 27.0* 30.4* 30.1* 28.4* 30.0*  MCV 78.2 78.0 78.8 79.0 79.6 79.2  PLT 349 380 451* 450* 430* 419*   Cardiac Enzymes:  Recent Labs Lab 09/21/15 2031  TROPONINI <0.03     CBG:  Recent Labs Lab 09/26/15 0740 09/26/15 1117 09/26/15 1642 09/26/15 2236 09/27/15 0742  GLUCAP 146* 191* 179* 131* 166*    Recent Results (from the past 240 hour(s))  Culture, blood (routine x 2)     Status: None   Collection Time: 09/21/15  8:16 PM  Result Value Ref Range Status   Specimen Description BLOOD A-LINE DRAW DRAWN BY RN  Final   Special Requests BOTTLES DRAWN AEROBIC AND ANAEROBIC  6CC  Final   Culture  Setup Time   Final    GRAM POSITIVE COCCI IN CLUSTERS RECOVERED FROM THE ANAEROBIC BOTTLE Gram Stain Report Called to,Read Back By and Verified With: COVINGTON,L. AT 1414 ON 09/24/2015 BY BAUGHAM,M. Performed at [facility]    Culture   Final    METHICILLIN RESISTANT STAPHYLOCOCCUS AUREUS Performed at [facility]    Report Status 09/27/2015 FINAL  Final   Organism ID, Bacteria METHICILLIN RESISTANT STAPHYLOCOCCUS AUREUS  Final      Susceptibility   Methicillin resistant staphylococcus aureus - MIC*    CIPROFLOXACIN >=8 RESISTANT Resistant     ERYTHROMYCIN >=8 RESISTANT Resistant     GENTAMICIN <=0.5 SENSITIVE Sensitive     OXACILLIN >=4 RESISTANT Resistant     TETRACYCLINE <=1 SENSITIVE Sensitive     VANCOMYCIN 1 SENSITIVE Sensitive     TRIMETH/SULFA >=320  RESISTANT Resistant     CLINDAMYCIN <=0.25 SENSITIVE Sensitive     RIFAMPIN <=0.5 SENSITIVE Sensitive     Inducible Clindamycin NEGATIVE Sensitive     * METHICILLIN RESISTANT STAPHYLOCOCCUS AUREUS  Culture, blood (routine x 2)     Status: None (Preliminary result)   Collection Time: 09/21/15 11:30 PM  Result Value Ref Range Status   Specimen Description BLOOD RIGHT ANTECUBITAL  Final   Special Requests BOTTLES DRAWN AEROBIC AND ANAEROBIC 6CC  Final   Culture  Setup Time   Final    GRAM POSITIVE COCCI IN CLUSTERS RECOVERED FROM THE AEROBIC BOTTLE Gram Stain Report Called to,Read Back By and Verified With: DAVIS,L. AT 1822 ON 09/25/2015 BY BAUGHAM,M. Performed at [facility]    Culture   Final    GRAM POSITIVE COCCI CULTURE REINCUBATED FOR BETTER GROWTH Performed at [facility]    Report Status PENDING  Incomplete  Urine culture     Status: None   Collection Time: 09/21/15 11:53 PM  Result Value Ref Range Status   Specimen Description URINE, CLEAN CATCH  Final   Special Requests NONE  Final   Culture   Final    MULTIPLE SPECIES PRESENT, SUGGEST RECOLLECTION Performed at [facility]    Report Status 09/23/2015 FINAL  Final  MRSA PCR Screening     Status: None   Collection Time: 09/22/15  1:52 PM  Result Value Ref Range Status   MRSA by PCR NEGATIVE NEGATIVE Final    Comment:        The GeneXpert MRSA Assay (FDA approved for NASAL specimens only), is one component of a comprehensive MRSA colonization surveillance program. It is not intended to diagnose MRSA infection nor to guide or monitor treatment for MRSA infections.   Culture, blood (routine x 2)     Status: None (Preliminary result)   Collection Time: 09/26/15  1:53 PM  Result Value Ref Range Status   Specimen Description RIGHT ANTECUBITAL  Final   Special Requests BOTTLES DRAWN AEROBIC AND ANAEROBIC PICC 6CC EACH  Final   Culture NO GROWTH < 24 HOURS  Final   Report Status PENDING   Incomplete  Culture, blood (routine x 2)     Status: None (Preliminary result)   Collection Time: 09/26/15  1:54 PM  Result Value Ref Range Status   Specimen Description LEFT ANTECUBITAL  Final   Special Requests BOTTLES DRAWN AEROBIC AND ANAEROBIC 6CC EACH  Final   Culture NO GROWTH < 24 HOURS  Final   Report Status PENDING  Incomplete     Studies: Mr Knee  Left  Wo Contrast  09/27/2015  CLINICAL DATA:  Status post partial left patellectomy 07/11/2015 due to a fracture. The patient's surgical wound subsequently became infected and dehisced. Status post irrigation and debridement. Now with staphylococcal bacteremia. EXAM: MRI OF THE LEFT KNEE WITHOUT CONTRAST TECHNIQUE: Multiplanar, multisequence MR imaging of the knee was performed. No intravenous contrast was administered. COMPARISON:  Plain films left knee 07/04/2015. FINDINGS: Surgical wound is seen anteriorly off the inferior pole the patella. There is diffuse and intense subcutaneous edema about the knee. All imaged musculature demonstrates marked atrophy. MENISCI Medial meniscus:  Intact. Lateral meniscus: There is a horizontal tear in the posterior horn of the lateral meniscus reaching the meniscal undersurface. The body of the lateral meniscus is extruded peripherally. LIGAMENTS Cruciates:  Intact. Collaterals:  Intact. CARTILAGE Patellofemoral: Extensive hyaline cartilage loss is present diffusely. Medial:  Thinned throughout. Lateral:  Thinned throughout. Joint: A small amount of joint fluid is present. Synovium about the joint appears markedly thickened. Intense edema is seen in Hoffa's fat throughout. Popliteal Fossa:  No Baker's cyst. Extensor Mechanism: The patient is status post inferior patellectomy and reconstruction of the patellar tendon. The extensor mechanism appears intact. Bones: Marrow edema is seen throughout the patella and in the medial and lateral femoral condyles and tibial plateaus. The appearance is highly suspicious for  osteomyelitis. IMPRESSION: Intense subcutaneous edema and synovitis about the joint most consistent with infection. Marrow edema throughout the patella and in the tibia and femur is most consistent with osteomyelitis. No abscess is identified. Horizontal tear posterior horn lateral meniscus. Status post inferior patellectomy and reconstruction of patellar tendon. Marked atrophy of all musculature about the knee. Electronically Signed   By: Drusilla Kanner M.D.   On: 09/27/2015 08:22   Dg Chest Port 1 View  09/26/2015  CLINICAL DATA:  Shortness of breath. EXAM: PORTABLE CHEST 1 VIEW COMPARISON:  09/21/2015 FINDINGS: The cardiac silhouette, mediastinal and hilar contours are within normal limits and stable. Low lung volumes with vascular crowding and bibasilar atelectasis. Could not exclude a right lower lobe infiltrate. No definite pleural effusions. The right PICC line is stable. IMPRESSION: Low lung volumes with vascular crowding and bibasilar atelectasis. Could not exclude a right basilar infiltrate. Electronically Signed   By: Rudie Meyer M.D.   On: 09/26/2015 14:54    Scheduled Meds: . albuterol  2.5 mg Nebulization Q6H  . amLODipine  10 mg Oral Daily  . atenolol  50 mg Oral Daily  .  ceFAZolin (ANCEF) IV  2 g Intravenous 3 times per day  . collagenase   Topical Daily  . DAPTOmycin (CUBICIN)  IV  700 mg Intravenous Q24H  . ferrous sulfate  325 mg Oral Q breakfast  . heparin  5,000 Units Subcutaneous 3 times per day  . imipramine  50 mg Oral TID  . insulin aspart  0-5 Units Subcutaneous QHS  . insulin aspart  0-9 Units Subcutaneous TID WC  . linagliptin  5 mg Oral Daily  . loratadine  10 mg Oral Daily  . potassium chloride SA  20 mEq Oral BID  . pravastatin  10 mg Oral q1800   Continuous Infusions:    Principal Problem:   HCAP (healthcare-associated pneumonia) Active Problems:   Staphylococcus aureus bacteremia   Bacterial infection of knee joint (HCC)   Wound dehiscence,  surgical   Decubitus ulcer of left buttock, unstageable (HCC)   Type II diabetes mellitus (HCC)   Essential hypertension, benign   Obesity   Microcytic  anemia   Hypokalemia   Absolute anemia   Acute renal failure (HCC)   Atelectasis    Time spent: 30 minutes  Elliot Cousin, MD  [facility] Pager [phone] If 7PM-7AM, please contact night-coverage at [email or web address], password [email or web address] 09/27/2015, 8:34 AM  LOS: 5 days

## 2015-09-27 NOTE — Consult Note (Signed)
[place] for Infectious Disease    Date of Admission:  09/21/2015  Date of Consult:  09/27/2015  Reason for Consult: MRSA bacteremia with source being osteomyelitis from her knee that is going to require surgery (likely AKA)  Referring Physician: Dr. Clementeen Graham   HPI: Lindsay Cobb is an 74 y.o. female. , L partial patellectomy 07-11-15 after fall. She returned on 9-8 with dehiscence of her wound. She developed wound drainage but denies erythema or streaking. She was taken to OR on 9-9 and was felt to have deep infection of her knee joint as well as infection vs fat necrosis of her thigh, and ruptured tendon. Cultures grew MRSA and she was maintained on vancomycin, she required multiple other surgeries including repeat trip to the operating room with Dr. Erlinda Hong on 9/12 With:  1. Irrigation and debridement of left knee wound with debridement of bone, tendon, subcutaneous tissue 16 cm 2. Adjacent tissue rearrangement left knee 2 x 2 centimeters 3. Application of negative wound therapy less than 50 cm    9/14   1. Surgical preparation of left knee wound for integra placement 5 x 5 cm 2. Application of skin substitute graft, integra. 5 x 5 cm  09/10/15:  Procedure: 1. Irrigation and debridement of bone, tendon, subcutaneous tissue 5 x 5 cm 2. Application of negative pressure wound therapy less than 50 cm  09/11/15:  1. Irrigation and debridement of muscle, tendon, skin 5 x 5 cm 2. Application of wound VAC less than 50 cm  She was in a skilled nursing facility and then developed fevers. She was brought back to the emerge department at [facility] on October 16 and found to be febrile. There was concern for pneumonia and she was admitted placed on Zosyn and continued vancomycin. Prior to this blood cultures were obtained and these are now growing MRSA in 2/2 sites.   I was notified of her positive cultures via the [place] system and had an corresponding with  her hospitalist at [facility]. Her PICC line has been removed. An echocardiogram is been performed.  Repeat blood cultures have been obtained. The interim the patient was changed over to daptomycin due to renal failure.  I was very concerned about her knee and recommended transfer to [facility] and consideration of MRI which was performed prior to transfer.  MRI shows::  Intense subcutaneous edema and synovitis about the joint most consistent with infection. Marrow edema throughout the patella and in the tibia and femur is most consistent with osteomyelitis. No abscess is identified.  Past Medical History  Diagnosis Date  . Diabetes mellitus     takes Januvia,Metformin,and Glipizide daily  . Hyperlipidemia     takes Lovastatin nightly  . Anemia     takes Ferrous Gluconate daily  . Peripheral edema     takes Furosemide daily as needed  . Depression     takes Tofranil daily  . Hypertension     takes Amlodipine,Atenolol,and Benazepril daily  . Arthritis   . Joint pain   . Joint swelling   . Cataracts, bilateral     immature  . Infection of skin of knee     left    Past Surgical History  Procedure Laterality Date  . Breast biopsy Left   . Colonoscopy    . Esophagogastroduodenoscopy    . Ercp    . Patellectomy Left 07/11/2015    Procedure: Left Partial PATELLECTOMY;  Surgeon: Leandrew Koyanagi, MD;  Location: [place];  Service: Orthopedics;  Laterality: Left;  . Irrigation and debridement knee Left 08/15/2015    Procedure: IRRIGATION AND DEBRIDEMENT LEFT KNEE;  Surgeon: Leandrew Koyanagi, MD;  Location: [place];  Service: Orthopedics;  Laterality: Left;  . Patellectomy Left 08/15/2015    Procedure: Revision PATELLECTOMY;  Surgeon: Leandrew Koyanagi, MD;  Location: [place];  Service: Orthopedics;  Laterality: Left;  . I&d extremity Left 08/18/2015    Procedure: IRRIGATION AND DEBRIDEMENT EXTREMITY;  Surgeon: Leandrew Koyanagi, MD;  Location: [place];  Service: Orthopedics;  Laterality: Left;  Marland Kitchen Minor  application of wound vac  08/18/2015    Procedure: MINOR APPLICATION OF WOUND VAC;  Surgeon: Leandrew Koyanagi, MD;  Location: [place];  Service: Orthopedics;;  . Irrigation and debridement knee Left 08/20/2015    Procedure: IRRIGATION AND DEBRIDEMENT KNEE WITH PLACEMENT OF INTEGRA;  Surgeon: Leandrew Koyanagi, MD;  Location: [place];  Service: Orthopedics;  Laterality: Left;  . I&d extremity Left 09/10/2015    Procedure: LEFT KNEE SPLIT THICKNESS SKIN GRAFT, IRRIGATION AND DEBRIDEMENT, WOUND VAC;  Surgeon: Leandrew Koyanagi, MD;  Location: [place];  Service: Orthopedics;  Laterality: Left;  . Skin split graft Left 09/10/2015    Procedure: SKIN GRAFT SPLIT THICKNESS;  Surgeon: Leandrew Koyanagi, MD;  Location: [place];  Service: Orthopedics;  Laterality: Left;  . Application of wound vac Left 09/10/2015    Procedure: APPLICATION OF WOUND VAC;  Surgeon: Leandrew Koyanagi, MD;  Location: [place];  Service: Orthopedics;  Laterality: Left;  . I&d extremity Left 09/12/2015    Procedure: IRRIGATION AND DEBRIDEMENT LEFT KNEE WITH PLACEMENT OF WOUND VAC;  Surgeon: Leandrew Koyanagi, MD;  Location: [place];  Service: Orthopedics;  Laterality: Left;    Social History:  reports that she has never smoked. She has never used smokeless tobacco. She reports that she does not drink alcohol or use illicit drugs.   Family History  Problem Relation Age of Onset  . Hypertension Mother   . Diabetes Mother   . Hypertension Father     No Known Allergies   Medications: I have reviewed patients current medications as documented in Epic Anti-infectives    Start     Dose/Rate Route Frequency Ordered Stop   09/26/15 1400  ceFAZolin (ANCEF) IVPB 2 g/50 mL premix  Status:  Discontinued     2 g 100 mL/hr over 30 Minutes Intravenous 3 times per day 09/26/15 1306 09/27/15 0851   09/25/15 1800  DAPTOmycin (CUBICIN) 700 mg in sodium chloride 0.9 % IVPB     700 mg 228 mL/hr over 30 Minutes Intravenous Every 24 hours 09/25/15 1650     09/23/15 1800  vancomycin  (VANCOCIN) 1,750 mg in sodium chloride 0.9 % 500 mL IVPB  Status:  Discontinued     1,750 mg 250 mL/hr over 120 Minutes Intravenous Every 24 hours 09/23/15 1327 09/25/15 1644   09/22/15 1200  vancomycin (VANCOCIN) IVPB 1000 mg/200 mL premix  Status:  Discontinued     1,000 mg 200 mL/hr over 60 Minutes Intravenous Every 12 hours 09/22/15 0945 09/23/15 1130   09/22/15 0800  piperacillin-tazobactam (ZOSYN) IVPB 3.375 g  Status:  Discontinued     3.375 g 12.5 mL/hr over 240 Minutes Intravenous Every 8 hours 09/22/15 0159 09/25/15 1644   09/22/15 0000  vancomycin (VANCOCIN) IVPB 1000 mg/200 mL premix     1,000 mg 200 mL/hr over 60 Minutes Intravenous  Every 1 hr x 2 09/21/15 2336 09/22/15 0159   09/21/15 2330  piperacillin-tazobactam (ZOSYN) IVPB 3.375 g  Status:  Discontinued     3.375 g 100 mL/hr over 30 Minutes Intravenous 3 times per day 09/21/15 2321 09/22/15 0157         ROS: As in history present was otherwise 12 point review of systems pertinent for fevers confusion wound and knee and otherwise 12 point review systems negative   Blood pressure 117/47, pulse 74, temperature 98.3 F (36.8 C), temperature source Oral, resp. rate 20, height $RemoveBe'5\' 6"'PHwTiZrsG$  (1.676 m), weight 250 lb (113.399 kg), SpO2 98 %.   General: Alert and awake, oriented x3, not in any acute distress. HEENT: anicteric sclera,  EOMI, oropharynx clear and without exudate Cardiovascular: egular rate, normal r,  no murmur rubs or gallops Pulmonary: clear to auscultation bilaterally, no wheezing, rales or rhonchi Gastrointestinal: soft nontender, nondistended, normal bowel sounds, Musculoskeletal: Skin,: Left knee with wound vacuum in place Neuro: nonfocal, strength and sensation intact   Results for orders placed or performed during the hospital encounter of 09/21/15 (from the past 48 hour(s))  Glucose, capillary     Status: Abnormal   Collection Time: 09/25/15  4:18 PM  Result Value Ref Range   Glucose-Capillary 187 (H)  65 - 99 mg/dL   Comment 1 Notify RN   Glucose, capillary     Status: Abnormal   Collection Time: 09/25/15  8:28 PM  Result Value Ref Range   Glucose-Capillary 162 (H) 65 - 99 mg/dL  Basic metabolic panel     Status: Abnormal   Collection Time: 09/26/15  6:44 AM  Result Value Ref Range   Sodium 134 (L) 135 - 145 mmol/L   Potassium 4.7 3.5 - 5.1 mmol/L   Chloride 104 101 - 111 mmol/L   CO2 19 (L) 22 - 32 mmol/L   Glucose, Bld 158 (H) 65 - 99 mg/dL   BUN 23 (H) 6 - 20 mg/dL   Creatinine, Ser 1.63 (H) 0.44 - 1.00 mg/dL   Calcium 8.1 (L) 8.9 - 10.3 mg/dL   GFR calc non Af Amer 30 (L) >60 mL/min   GFR calc Af Amer 35 (L) >60 mL/min    Comment: (NOTE) The eGFR has been calculated using the CKD EPI equation. This calculation has not been validated in all clinical situations. eGFR's persistently <60 mL/min signify possible Chronic Kidney Disease.    Anion gap 11 5 - 15  CBC     Status: Abnormal   Collection Time: 09/26/15  6:44 AM  Result Value Ref Range   WBC 17.7 (H) 4.0 - 10.5 K/uL   RBC 3.57 (L) 3.87 - 5.11 MIL/uL   Hemoglobin 9.1 (L) 12.0 - 15.0 g/dL   HCT 28.4 (L) 36.0 - 46.0 %   MCV 79.6 78.0 - 100.0 fL   MCH 25.5 (L) 26.0 - 34.0 pg   MCHC 32.0 30.0 - 36.0 g/dL   RDW 16.4 (H) 11.5 - 15.5 %   Platelets 430 (H) 150 - 400 K/uL  Glucose, capillary     Status: Abnormal   Collection Time: 09/26/15  7:40 AM  Result Value Ref Range   Glucose-Capillary 146 (H) 65 - 99 mg/dL  Glucose, capillary     Status: Abnormal   Collection Time: 09/26/15 11:17 AM  Result Value Ref Range   Glucose-Capillary 191 (H) 65 - 99 mg/dL  Culture, blood (routine x 2)     Status: None (Preliminary result)   Collection  Time: 09/26/15  1:53 PM  Result Value Ref Range   Specimen Description RIGHT ANTECUBITAL    Special Requests BOTTLES DRAWN AEROBIC AND ANAEROBIC PICC 6CC EACH    Culture NO GROWTH < 24 HOURS    Report Status PENDING   Culture, blood (routine x 2)     Status: None (Preliminary result)     Collection Time: 09/26/15  1:54 PM  Result Value Ref Range   Specimen Description LEFT ANTECUBITAL    Special Requests BOTTLES DRAWN AEROBIC AND ANAEROBIC 6CC EACH    Culture NO GROWTH < 24 HOURS    Report Status PENDING   Brain natriuretic peptide     Status: None   Collection Time: 09/26/15  2:14 PM  Result Value Ref Range   B Natriuretic Peptide 48.0 0.0 - 100.0 pg/mL  Glucose, capillary     Status: Abnormal   Collection Time: 09/26/15  4:42 PM  Result Value Ref Range   Glucose-Capillary 179 (H) 65 - 99 mg/dL  Glucose, capillary     Status: Abnormal   Collection Time: 09/26/15 10:36 PM  Result Value Ref Range   Glucose-Capillary 131 (H) 65 - 99 mg/dL   Comment 1 Notify RN    Comment 2 Document in Chart   Basic metabolic panel     Status: Abnormal   Collection Time: 09/27/15  6:42 AM  Result Value Ref Range   Sodium 133 (L) 135 - 145 mmol/L   Potassium 4.8 3.5 - 5.1 mmol/L   Chloride 105 101 - 111 mmol/L   CO2 19 (L) 22 - 32 mmol/L   Glucose, Bld 170 (H) 65 - 99 mg/dL   BUN 27 (H) 6 - 20 mg/dL   Creatinine, Ser 1.76 (H) 0.44 - 1.00 mg/dL   Calcium 8.2 (L) 8.9 - 10.3 mg/dL   GFR calc non Af Amer 28 (L) >60 mL/min   GFR calc Af Amer 32 (L) >60 mL/min    Comment: (NOTE) The eGFR has been calculated using the CKD EPI equation. This calculation has not been validated in all clinical situations. eGFR's persistently <60 mL/min signify possible Chronic Kidney Disease.    Anion gap 9 5 - 15  CBC     Status: Abnormal   Collection Time: 09/27/15  6:42 AM  Result Value Ref Range   WBC 20.6 (H) 4.0 - 10.5 K/uL   RBC 3.79 (L) 3.87 - 5.11 MIL/uL   Hemoglobin 9.5 (L) 12.0 - 15.0 g/dL   HCT 30.0 (L) 36.0 - 46.0 %   MCV 79.2 78.0 - 100.0 fL   MCH 25.1 (L) 26.0 - 34.0 pg   MCHC 31.7 30.0 - 36.0 g/dL   RDW 16.7 (H) 11.5 - 15.5 %   Platelets 419 (H) 150 - 400 K/uL  Glucose, capillary     Status: Abnormal   Collection Time: 09/27/15  7:42 AM  Result Value Ref Range    Glucose-Capillary 166 (H) 65 - 99 mg/dL  CK     Status: Abnormal   Collection Time: 09/27/15 12:10 PM  Result Value Ref Range   Total CK 21 (L) 38 - 234 U/L  Glucose, capillary     Status: Abnormal   Collection Time: 09/27/15 12:58 PM  Result Value Ref Range   Glucose-Capillary 159 (H) 65 - 99 mg/dL   '@BRIEFLABTABLE'$ (sdes,specrequest,cult,reptstatus)   ) Recent Results (from the past 720 hour(s))  Culture, blood (routine x 2)     Status: None   Collection Time: 09/21/15  8:16  PM  Result Value Ref Range Status   Specimen Description BLOOD A-LINE DRAW DRAWN BY RN  Final   Special Requests BOTTLES DRAWN AEROBIC AND ANAEROBIC 6CC  Final   Culture  Setup Time   Final    GRAM POSITIVE COCCI IN CLUSTERS RECOVERED FROM THE ANAEROBIC BOTTLE Gram Stain Report Called to,Read Back By and Verified With: COVINGTON,L. AT 1610 ON 09/24/2015 BY BAUGHAM,M. Performed at [place] Performed at [facility]    Report Status 09/27/2015 FINAL  Final   Organism ID, Bacteria METHICILLIN RESISTANT STAPHYLOCOCCUS AUREUS  Final      Susceptibility   Methicillin resistant staphylococcus aureus - MIC*    CIPROFLOXACIN >=8 RESISTANT Resistant     ERYTHROMYCIN >=8 RESISTANT Resistant     GENTAMICIN <=0.5 SENSITIVE Sensitive     OXACILLIN >=4 RESISTANT Resistant     TETRACYCLINE <=1 SENSITIVE Sensitive     VANCOMYCIN 1 SENSITIVE Sensitive     TRIMETH/SULFA >=320 RESISTANT Resistant     CLINDAMYCIN <=0.25 SENSITIVE Sensitive     RIFAMPIN <=0.5 SENSITIVE Sensitive     Inducible Clindamycin NEGATIVE Sensitive     * METHICILLIN RESISTANT STAPHYLOCOCCUS AUREUS  Culture, blood (routine x 2)     Status: None (Preliminary result)   Collection Time: 09/21/15 11:30 PM  Result Value Ref Range Status   Specimen Description BLOOD RIGHT ANTECUBITAL  Final   Special Requests BOTTLES DRAWN AEROBIC AND ANAEROBIC 6CC  Final   Culture   Setup Time   Final    GRAM POSITIVE COCCI IN CLUSTERS RECOVERED FROM THE AEROBIC BOTTLE Gram Stain Report Called to,Read Back By and Verified With: DAVIS,L. AT 1822 ON 09/25/2015 BY BAUGHAM,M. Performed at [facility]    Culture   Final    [place] REINCUBATED FOR BETTER GROWTH Performed at [facility]    Report Status PENDING  Incomplete  Urine culture     Status: None   Collection Time: 09/21/15 11:53 PM  Result Value Ref Range Status   Specimen Description URINE, CLEAN CATCH  Final   Special Requests NONE  Final   Culture   Final    MULTIPLE SPECIES PRESENT, SUGGEST RECOLLECTION Performed at [facility]    Report Status 09/23/2015 FINAL  Final  MRSA PCR Screening     Status: None   Collection Time: 09/22/15  1:52 PM  Result Value Ref Range Status   MRSA by PCR NEGATIVE NEGATIVE Final    Comment:        The GeneXpert MRSA Assay (FDA approved for NASAL specimens only), is one component of a comprehensive MRSA colonization surveillance program. It is not intended to diagnose MRSA infection nor to guide or monitor treatment for MRSA infections.   Culture, blood (routine x 2)     Status: None (Preliminary result)   Collection Time: 09/26/15  1:53 PM  Result Value Ref Range Status   Specimen Description RIGHT ANTECUBITAL  Final   Special Requests BOTTLES DRAWN AEROBIC AND ANAEROBIC PICC [place]  Final   Culture NO GROWTH < 24 HOURS  Final   Report Status PENDING  Incomplete  Culture, blood (routine x 2)     Status: None (Preliminary result)   Collection Time: 09/26/15  1:54 PM  Result Value Ref Range Status   Specimen Description LEFT ANTECUBITAL  Final   Special Requests BOTTLES DRAWN AEROBIC AND ANAEROBIC 6CC  EACH  Final   Culture NO GROWTH < 24 HOURS  Final   Report Status PENDING  Incomplete     Impression/Recommendation  Principal Problem:   HCAP (healthcare-associated pneumonia) Active Problems:   Type II  diabetes mellitus ([place])   Essential hypertension, benign   Obesity   Microcytic anemia   Wound dehiscence, surgical   Hypokalemia   Decubitus ulcer of left buttock, unstageable (HCC)   Absolute anemia   Staphylococcus aureus bacteremia   Acute renal failure (HCC)   Atelectasis   Bacterial infection of knee joint ([place])   Lindsay Cobb is a 74 y.o. 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  See the SAB formr below but biggest issues are  She is going to [place] [facility] UNFORTUNATELY MAY HAVE TO BE AN AKA TO GET CONTROL OF THIS INFECTION  IT IS NOT WORTH TRYING A SURGERY THAT MAY FAIL AGAIN AND EXPOSE HER TO RISK OF SEPTIC SHOCK AGAIN FROM MRSA AND OTHER morbidities such as endocarditis which he may have now and will also be worked up.  Orthopedics is being consulted.       [place] Antimicrobial Management Team Staphylococcus aureus bacteremia   Staphylococcus aureus bacteremia (SAB) is associated with a high rate of complications and mortality.  Specific aspects of clinical management are critical to optimizing the outcome of patients with SAB.  Therefore, the [facility] Antimicrobial Management Team [facility]) has initiated an intervention aimed at improving the management of SAB at [facility].  To do so, Infectious Diseases physicians are providing an evidence-based consult for the management of all patients with SAB.     Yes No Comments  Perform follow-up blood cultures (even if the patient is afebrile) to ensure clearance of bacteremia $RemoveBefor'[x]'fvylqOQfVjVl$'[]'$  And repeat cultures after PICC removal  Remove vascular catheter and obtain follow-up blood cultures after the removal of the catheter $RemoveBefo'[x]'fMswQNBoxvX$'[]'$      Perform echocardiography to evaluate for endocarditis (transthoracic ECHO is 40-50% sensitive, TEE is > 90% sensitive) $RemoveBefore'[]'OhKiNhzrATTMO$'[]'$  Please keep in mind, that neither test can definitively EXCLUDE endocarditis, and that should clinical suspicion remain high for endocarditis the patient should then still be treated with an "endocarditis" duration of therapy = 6 weeks  Report some 2-D echo is not yet back would strongly consider transesophageal echocardiogram to look for endocarditis   Consult electrophysiologist to evaluate implanted cardiac device (pacemaker, ICD) $RemoveBeforeDE'[]'loBqMAEiPIFGzDc$'[]'$   NA   Ensure source control $RemoveBefore'[]'ygrJDjRbfRSxB$'[]'$  Have all abscesses been drained effectively? Have deep seeded infections (septic joints or osteomyelitis) had appropriate surgical debridement?  NO SHE NEEDS ANOTHER KNEE SURGERY AND THIS TIME I THINK IT NEEDS TO BE AN AKA  Investigate for "metastatic" sites of infection $RemoveBefo'[]'nNGVaLPVaJS$'[]'$  Does the patient have ANY symptom or physical exam finding that would suggest a deeper infection (back or neck pain that may be suggestive of vertebral osteomyelitis or epidural abscess, muscle pain that could be a symptom of pyomyositis)?  Keep in mind that for deep seeded infections MRI imaging with contrast is preferred rather than other often insensitive tests such as plain x-rays, especially early in a patient's presentation. Not clear any other sites at this time  Change  antibiotic therapy to daptomycin  $RemoveBef'[]'HdWwAngxie$'[]'$  Beta-lactam antibiotics are preferred for MSSA due to higher cure rates.   If on Vancomycin, goal trough should be 15 - 20 mcg/mL  Estimated duration of IV antibiotic therapy:   8 weeks,  $Remov'[]'LlOafV$'[]'$  Consult case management for probably prolonged outpatient IV antibiotic therapy   Screening: check HIV, HCV     09/27/2015, 2:01 PM   Thank you so much for this interesting consult  [place] for [place] [phone] (pager) [phone] (office) 09/27/2015, 2:01 PM  Rhina Brackett  [place] 09/27/2015, 2:01 PM

## 2015-09-27 NOTE — Consult Note (Signed)
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 female.  HPI: Patient is a 74 year old woman with diabetic insensate neuropathy who has undergone 6 surgeries for limb salvage status post patellar tendon rupture. Patient presents with elevated white white blood cell count and persistent deep infection in her left knee.  Past Medical History  Diagnosis Date  . Diabetes mellitus     takes Januvia,Metformin,and Glipizide daily  . Hyperlipidemia     takes Lovastatin nightly  . Anemia     takes Ferrous Gluconate daily  . Peripheral edema     takes Furosemide daily as needed  . Depression     takes Tofranil daily  . Hypertension     takes Amlodipine,Atenolol,and Benazepril daily  . Arthritis   . Joint pain   . Joint swelling   . Cataracts, bilateral     immature  . Infection of skin of knee     left    Past Surgical History  Procedure Laterality Date  . Breast biopsy Left   . Colonoscopy    . Esophagogastroduodenoscopy    . Ercp    . Patellectomy Left 07/11/2015    Procedure: Left Partial PATELLECTOMY;  Surgeon: Leandrew Koyanagi, MD;  Location: [place];  Service: Orthopedics;  Laterality: Left;  . Irrigation and debridement knee Left 08/15/2015    Procedure: IRRIGATION AND DEBRIDEMENT LEFT KNEE;  Surgeon: Leandrew Koyanagi, MD;  Location: [place];  Service: Orthopedics;  Laterality: Left;  . Patellectomy Left 08/15/2015    Procedure: Revision PATELLECTOMY;  Surgeon: Leandrew Koyanagi, MD;  Location: [place];  Service: Orthopedics;  Laterality: Left;  . I&d extremity Left 08/18/2015    Procedure: IRRIGATION AND DEBRIDEMENT EXTREMITY;  Surgeon: Leandrew Koyanagi, MD;  Location: [place];  Service: Orthopedics;  Laterality: Left;  Marland Kitchen Minor application of wound vac  08/18/2015    Procedure: MINOR APPLICATION OF WOUND VAC;  Surgeon: Leandrew Koyanagi, MD;  Location: [place];  Service: Orthopedics;;  .  Irrigation and debridement knee Left 08/20/2015    Procedure: IRRIGATION AND DEBRIDEMENT KNEE WITH PLACEMENT OF INTEGRA;  Surgeon: Leandrew Koyanagi, MD;  Location: [place];  Service: Orthopedics;  Laterality: Left;  . I&d extremity Left 09/10/2015    Procedure: LEFT KNEE SPLIT THICKNESS SKIN GRAFT, IRRIGATION AND DEBRIDEMENT, WOUND VAC;  Surgeon: Leandrew Koyanagi, MD;  Location: [place];  Service: Orthopedics;  Laterality: Left;  . Skin split graft Left 09/10/2015    Procedure: SKIN GRAFT SPLIT THICKNESS;  Surgeon: Leandrew Koyanagi, MD;  Location: [place];  Service: Orthopedics;  Laterality: Left;  . Application of wound vac Left 09/10/2015    Procedure: APPLICATION OF WOUND VAC;  Surgeon: Leandrew Koyanagi, MD;  Location: [place];  Service: Orthopedics;  Laterality: Left;  . I&d extremity Left 09/12/2015    Procedure: IRRIGATION AND DEBRIDEMENT LEFT KNEE WITH PLACEMENT OF WOUND VAC;  Surgeon: Leandrew Koyanagi, MD;  Location: [place];  Service: Orthopedics;  Laterality: Left;    Family History  Problem Relation Age of Onset  . Hypertension Mother   . Diabetes Mother   . Hypertension Father     Social History:  reports that she has never smoked. She has never used smokeless tobacco. She reports that she does not drink alcohol or use illicit drugs.  Allergies: No Known Allergies  Medications: I have reviewed the patient's current  medications.  Results for orders placed or performed during the hospital encounter of 09/21/15 (from the past 48 hour(s))  Glucose, capillary     Status: Abnormal   Collection Time: 09/25/15  8:28 PM  Result Value Ref Range   Glucose-Capillary 162 (H) 65 - 99 mg/dL  Basic metabolic panel     Status: Abnormal   Collection Time: 09/26/15  6:44 AM  Result Value Ref Range   Sodium 134 (L) 135 - 145 mmol/L   Potassium 4.7 3.5 - 5.1 mmol/L   Chloride 104 101 - 111 mmol/L   CO2 19 (L) 22 - 32 mmol/L   Glucose, Bld 158 (H) 65 - 99 mg/dL   BUN 23 (H) 6 - 20 mg/dL   Creatinine, Ser 1.63 (H) 0.44 -  1.00 mg/dL   Calcium 8.1 (L) 8.9 - 10.3 mg/dL   GFR calc non Af Amer 30 (L) >60 mL/min   GFR calc Af Amer 35 (L) >60 mL/min    Comment: (NOTE) The eGFR has been calculated using the CKD EPI equation. This calculation has not been validated in all clinical situations. eGFR's persistently <60 mL/min signify possible Chronic Kidney Disease.    Anion gap 11 5 - 15  CBC     Status: Abnormal   Collection Time: 09/26/15  6:44 AM  Result Value Ref Range   WBC 17.7 (H) 4.0 - 10.5 K/uL   RBC 3.57 (L) 3.87 - 5.11 MIL/uL   Hemoglobin 9.1 (L) 12.0 - 15.0 g/dL   HCT 28.4 (L) 36.0 - 46.0 %   MCV 79.6 78.0 - 100.0 fL   MCH 25.5 (L) 26.0 - 34.0 pg   MCHC 32.0 30.0 - 36.0 g/dL   RDW 16.4 (H) 11.5 - 15.5 %   Platelets 430 (H) 150 - 400 K/uL  Glucose, capillary     Status: Abnormal   Collection Time: 09/26/15  7:40 AM  Result Value Ref Range   Glucose-Capillary 146 (H) 65 - 99 mg/dL  Glucose, capillary     Status: Abnormal   Collection Time: 09/26/15 11:17 AM  Result Value Ref Range   Glucose-Capillary 191 (H) 65 - 99 mg/dL  Culture, blood (routine x 2)     Status: None (Preliminary result)   Collection Time: 09/26/15  1:53 PM  Result Value Ref Range   Specimen Description RIGHT ANTECUBITAL    Special Requests BOTTLES DRAWN AEROBIC AND ANAEROBIC PICC [place] EACH    Culture NO GROWTH < 24 HOURS    Report Status PENDING   Culture, blood (routine x 2)     Status: None (Preliminary result)   Collection Time: 09/26/15  1:54 PM  Result Value Ref Range   Specimen Description LEFT ANTECUBITAL    Special Requests BOTTLES DRAWN AEROBIC AND ANAEROBIC 6CC EACH    Culture NO GROWTH < 24 HOURS    Report Status PENDING   Brain natriuretic peptide     Status: None   Collection Time: 09/26/15  2:14 PM  Result Value Ref Range   B Natriuretic Peptide 48.0 0.0 - 100.0 pg/mL  Glucose, capillary     Status: Abnormal   Collection Time: 09/26/15  4:42 PM  Result Value Ref Range   Glucose-Capillary 179 (H) 65 -  99 mg/dL  Glucose, capillary     Status: Abnormal   Collection Time: 09/26/15 10:36 PM  Result Value Ref Range   Glucose-Capillary 131 (H) 65 - 99 mg/dL   Comment 1 Notify RN    Comment 2  Document in Chart   Basic metabolic panel     Status: Abnormal   Collection Time: 09/27/15  6:42 AM  Result Value Ref Range   Sodium 133 (L) 135 - 145 mmol/L   Potassium 4.8 3.5 - 5.1 mmol/L   Chloride 105 101 - 111 mmol/L   CO2 19 (L) 22 - 32 mmol/L   Glucose, Bld 170 (H) 65 - 99 mg/dL   BUN 27 (H) 6 - 20 mg/dL   Creatinine, Ser 1.76 (H) 0.44 - 1.00 mg/dL   Calcium 8.2 (L) 8.9 - 10.3 mg/dL   GFR calc non Af Amer 28 (L) >60 mL/min   GFR calc Af Amer 32 (L) >60 mL/min    Comment: (NOTE) The eGFR has been calculated using the CKD EPI equation. This calculation has not been validated in all clinical situations. eGFR's persistently <60 mL/min signify possible Chronic Kidney Disease.    Anion gap 9 5 - 15  CBC     Status: Abnormal   Collection Time: 09/27/15  6:42 AM  Result Value Ref Range   WBC 20.6 (H) 4.0 - 10.5 K/uL   RBC 3.79 (L) 3.87 - 5.11 MIL/uL   Hemoglobin 9.5 (L) 12.0 - 15.0 g/dL   HCT 30.0 (L) 36.0 - 46.0 %   MCV 79.2 78.0 - 100.0 fL   MCH 25.1 (L) 26.0 - 34.0 pg   MCHC 31.7 30.0 - 36.0 g/dL   RDW 16.7 (H) 11.5 - 15.5 %   Platelets 419 (H) 150 - 400 K/uL  Glucose, capillary     Status: Abnormal   Collection Time: 09/27/15  7:42 AM  Result Value Ref Range   Glucose-Capillary 166 (H) 65 - 99 mg/dL  CK     Status: Abnormal   Collection Time: 09/27/15 12:10 PM  Result Value Ref Range   Total CK 21 (L) 38 - 234 U/L  Glucose, capillary     Status: Abnormal   Collection Time: 09/27/15 12:58 PM  Result Value Ref Range   Glucose-Capillary 159 (H) 65 - 99 mg/dL    Mr Knee Left  Wo Contrast  09/27/2015  CLINICAL DATA:  Status post partial left patellectomy 07/11/2015 due to a fracture. The patient's surgical wound subsequently became infected and dehisced. Status post irrigation  and debridement. Now with staphylococcal bacteremia. EXAM: MRI OF THE LEFT KNEE WITHOUT CONTRAST TECHNIQUE: Multiplanar, multisequence MR imaging of the knee was performed. No intravenous contrast was administered. COMPARISON:  Plain films left knee 07/04/2015. FINDINGS: Surgical wound is seen anteriorly off the inferior pole the patella. There is diffuse and intense subcutaneous edema about the knee. All imaged musculature demonstrates marked atrophy. MENISCI Medial meniscus:  Intact. Lateral meniscus: There is a horizontal tear in the posterior horn of the lateral meniscus reaching the meniscal undersurface. The body of the lateral meniscus is extruded peripherally. LIGAMENTS Cruciates:  Intact. Collaterals:  Intact. CARTILAGE Patellofemoral: Extensive hyaline cartilage loss is present diffusely. Medial:  Thinned throughout. Lateral:  Thinned throughout. Joint: A small amount of joint fluid is present. Synovium about the joint appears markedly thickened. Intense edema is seen in Hoffa's fat throughout. Popliteal Fossa:  No Baker's cyst. Extensor Mechanism: The patient is status post inferior patellectomy and reconstruction of the patellar tendon. The extensor mechanism appears intact. Bones: Marrow edema is seen throughout the patella and in the medial and lateral femoral condyles and tibial plateaus. The appearance is highly suspicious for osteomyelitis. IMPRESSION: Intense subcutaneous edema and synovitis about the joint most consistent  with infection. Marrow edema throughout the patella and in the tibia and femur is most consistent with osteomyelitis. No abscess is identified. Horizontal tear posterior horn lateral meniscus. Status post inferior patellectomy and reconstruction of patellar tendon. Marked atrophy of all musculature about the knee. Electronically Signed   By: Inge Rise M.D.   On: 09/27/2015 08:22   Dg Chest Port 1 View  09/26/2015  CLINICAL DATA:  Shortness of breath. EXAM: PORTABLE  CHEST 1 VIEW COMPARISON:  09/21/2015 FINDINGS: The cardiac silhouette, mediastinal and hilar contours are within normal limits and stable. Low lung volumes with vascular crowding and bibasilar atelectasis. Could not exclude a right lower lobe infiltrate. No definite pleural effusions. The right PICC line is stable. IMPRESSION: Low lung volumes with vascular crowding and bibasilar atelectasis. Could not exclude a right basilar infiltrate. Electronically Signed   By: Marijo Sanes M.D.   On: 09/26/2015 14:54    Review of Systems  All other systems reviewed and are negative.  Blood pressure 125/45, pulse 72, temperature 99.3 F (37.4 C), temperature source Oral, resp. rate 18, height $RemoveBe'5\' 6"'[id number]$  (1.676 m), weight 113.399 kg (250 lb), SpO2 98 %. Physical Exam  On examination there is no cellulitis around the left knee the left knee is swollen the wound VAC is in place and there is clear serous drainage with no purulence and the wound VAC. There is no fluctuance around the knee no signs of a clinical abscess. Review of the MRI scan shows increased edema in the distal femur proximal tibia and patella consistent with osteomyelitis. There is no abscess. 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  Plan: Patient will most likely require an above-the-knee amputation. I will discuss this with Dr. Debby Freiberg V 09/27/2015, 4:22 PM

## 2015-09-27 NOTE — Telephone Encounter (Signed)
[facility] is NOT [facility] to [facility] after she had been asked to remove PICC line  She has been outside of town, I suspect there may be IVDU  We should send her a letter to ask that she come to ER for removal of PICC line

## 2015-09-27 NOTE — Progress Notes (Signed)
Patient respiratory assessment done and changed to PRN. NO history or reason for treatments. Some atelectasis being treated with IS and working on deep breathing techniques. Will reassess as needed.

## 2015-09-27 NOTE — Progress Notes (Signed)
Patient ID: Lindsay Cobb, female   DOB: 10/05/1941, 74 y.o.   MRN: [id number] I did discuss the patient's case with Dr. XU. I had a long discussion with the patient as well as separately with her husband. I discussed the only option with the extensive osteomyelitis of the femur patella and tibia would be an above-the-knee amputation. I recommended against delaying surgery. I have posted this surgery for tomorrow morning Sunday. Patient's husband will talk to the patient's sisters. 

## 2015-09-27 NOTE — Progress Notes (Signed)
RN called and [facility]  Pt transferred off the floor via carelink. VSS. Education and information provided to the patient and husband BS. PT demonstrates understanding. Pt left with all belongings.  Lesly Dukesachel J Everett, RN

## 2015-09-27 NOTE — Progress Notes (Signed)
Admission note:  Arrival Method: Stretcher Mental Orientation: alert and oriented to self  Telemetry: box #11 applied and CCMD notified  Assessment: completed Skin: stage 2 to left hip area; wound vac to right knee, immobilizer brace to left knee also  IV: left hand saline lock  Pain: pt denies pain  Tubes: N/A Safety Measures: Patient Handbook has been given, and discussed the Fall Prevention worksheet. Left at bedside  Admission: Completed and admission orders have been written  6E Orientation: Patient has been oriented to the unit, staff and to the room.     Orphia Mctigue Levi StraussHubbard BSN, RN [facility] Phone [phone]

## 2015-09-27 NOTE — Progress Notes (Signed)
[facility] PROGRESS NOTE  ALIZAY BRONKEMA GUY:[id number] DOB: 12-18-1940 DOA: 09/21/2015 PCP: [name], MD Please see progress note from 10/21 for detail, 74 year old patient who was recently discharged from [facility] on 10/11 after undergoing incision and drainage of left knee by Dr. [name]. Wound cultures at that time indicated MRSA. She was discharged with a PICC line on vancomycin to skilled nursing facility. She'll return to the hospital with fevers and questionable pneumonia. She was continued on vancomycin and started on Zosyn. Blood cultures returned positive for Staphylococcus aureus. Discussed with infectious disease who recommended transfer to [name] for orthopedic evaluation of left knee since it is felt that this is the likely source of her bacteremia. PICC line has been removed and echocardiogram has been ordered. Vancomycin was discontinued in favor of daptomycin due to rising creatinine. After discussion with ID and ortho ( Dr [name]) pt transferred to [facility] for further evaluation.    Assessment/Plan: Staph aureus bacteremia Possible source is infected left knee. IV daptomycin. Dr [name] and Dr [name] notified. Will follow with recs Worsening WBC. Afebrile. Monitor closely  HCAP Afebrile. sats stable. Not in distress. continue daptomycin.  Wound dehiscence/infection left knee with recent hospitalization from 10/5-10/11  . Surgical debridement performed by Dr. [name] on 10/7 with subsequent wound vac. Wound cultures at that time were positive for MRSA. Wound care nurse changed dressing on 10/17. Recommendations on last discharge were for 6 weeks of IV vancomycin. Dr [name] will see pt.   AKI Worsened renal fn. off vanco and ACEi. Received a dose of IV lasix on 10/21 for volue overload.  Monitor  Code Status: full  Family Communication:none at bedside Disposition Plan: inpt    Consultants:  Informed Dr [name] and Dr [name]     Antibiotics:  IV  daptomycin  HPI/Subjective: Seen and examined after arrival to floor. C/o some pain left knee  Objective: Filed Vitals:   09/27/15 1146  BP: 117/47  Pulse: 74  Temp: 98.3 F (36.8 C)  Resp: 20    Intake/Output Summary (Last 24 hours) at 09/27/15 1401 Last data filed at 09/27/15 0901  Gross per 24 hour  Intake    752 ml  Output    150 ml  Net    602 ml   Filed Weights   09/21/15 1932  Weight: 113.399 kg (250 lb)    Exam:   General:  NAD   Chest: clear b/l  CVS: NS1&S2  GI: soft, ND, NT  Musculoskeletal: knee brace with wound vac.  Data Reviewed: Basic Metabolic Panel:  Recent Labs Lab 09/21/15 2330 09/23/15 5638 09/24/15 0629 09/25/15 0601 09/26/15 0644 09/27/15 0642  NA  --  137 138 134* 134* 133*  K  --  3.5 4.3 4.4 4.7 4.8  CL  --  104 105 104 104 105  CO2  --  26 24 21* 19* 19*  GLUCOSE  --  152* 142* 154* 158* 170*  BUN  --  23* 27*  CREATININE  --  1.01* 1.23* 1.46* 1.63* 1.76*  CALCIUM  --  8.3* 8.5* 8.1* 8.1* 8.2*  MG 1.3*  --  1.7  --   --   --    Liver Function Tests:  Recent Labs Lab 09/23/15 0634  AST 18  ALT 15  ALKPHOS 59  BILITOT 0.4  PROT 6.4*  ALBUMIN 2.2*   No results for input(s): LIPASE, AMYLASE in the last 168 hours. No results for input(s): AMMONIA  in the last 168 hours. CBC:  Recent Labs Lab 09/21/15 2031 09/23/15 6270 09/24/15 0629 09/25/15 0601 09/26/15 0644 09/27/15 0642  WBC 10.0 10.6* 13.9* 15.9* 17.7* 20.6*  NEUTROABS 6.0  --   --   --   --   --   HGB 8.3* 8.8* 9.6* 9.6* 9.1* 9.5*  HCT 25.4* 27.0* 30.4* 30.1* 28.4* 30.0*  MCV 78.2 78.0 78.8 79.0 79.6 79.2  PLT 349 380 451* 450* 430* 419*   Cardiac Enzymes:  Recent Labs Lab 09/21/15 2031 09/27/15 1210  CKTOTAL  --  21*  TROPONINI <0.03  --    BNP (last 3 results)  Recent Labs  09/26/15 1414  BNP 48.0    ProBNP (last 3 results) No results for input(s): PROBNP in the last 8760 hours.  CBG:  Recent Labs Lab  09/26/15 1117 09/26/15 1642 09/26/15 2236 09/27/15 0742 09/27/15 1258  GLUCAP 191* 179* 131* 166* 159*    Recent Results (from the past 240 hour(s))  Culture, blood (routine x 2)     Status: None   Collection Time: 09/21/15  8:16 PM  Result Value Ref Range Status   Specimen Description BLOOD A-LINE DRAW DRAWN BY RN  Final   Special Requests BOTTLES DRAWN AEROBIC AND ANAEROBIC 6CC  Final   Culture  Setup Time   Final    GRAM POSITIVE COCCI IN CLUSTERS RECOVERED FROM THE ANAEROBIC BOTTLE Gram Stain Report Called to,Read Back By and Verified With: COVINGTON,L. AT 1414 ON 09/24/2015 BY BAUGHAM,M. Performed at [facility]    Culture   Final    METHICILLIN RESISTANT STAPHYLOCOCCUS AUREUS Performed at [facility]    Report Status 09/27/2015 FINAL  Final   Organism ID, Bacteria METHICILLIN RESISTANT STAPHYLOCOCCUS AUREUS  Final      Susceptibility   Methicillin resistant staphylococcus aureus - MIC*    CIPROFLOXACIN >=8 RESISTANT Resistant     ERYTHROMYCIN >=8 RESISTANT Resistant     GENTAMICIN <=0.5 SENSITIVE Sensitive     OXACILLIN >=4 RESISTANT Resistant     TETRACYCLINE <=1 SENSITIVE Sensitive     VANCOMYCIN 1 SENSITIVE Sensitive     TRIMETH/SULFA >=320 RESISTANT Resistant     CLINDAMYCIN <=0.25 SENSITIVE Sensitive     RIFAMPIN <=0.5 SENSITIVE Sensitive     Inducible Clindamycin NEGATIVE Sensitive     * METHICILLIN RESISTANT STAPHYLOCOCCUS AUREUS  Culture, blood (routine x 2)     Status: None (Preliminary result)   Collection Time: 09/21/15 11:30 PM  Result Value Ref Range Status   Specimen Description BLOOD RIGHT ANTECUBITAL  Final   Special Requests BOTTLES DRAWN AEROBIC AND ANAEROBIC 6CC  Final   Culture  Setup Time   Final    GRAM POSITIVE COCCI IN CLUSTERS RECOVERED FROM THE AEROBIC BOTTLE Gram Stain Report Called to,Read Back By and Verified With: DAVIS,L. AT 1822 ON 09/25/2015 BY BAUGHAM,M. Performed at [facility]    Culture   Final     GRAM POSITIVE COCCI CULTURE REINCUBATED FOR BETTER GROWTH Performed at [facility]    Report Status PENDING  Incomplete  Urine culture     Status: None   Collection Time: 09/21/15 11:53 PM  Result Value Ref Range Status   Specimen Description URINE, CLEAN CATCH  Final   Special Requests NONE  Final   Culture   Final    MULTIPLE SPECIES PRESENT, SUGGEST RECOLLECTION Performed at [facility]    Report Status 09/23/2015 FINAL  Final  MRSA PCR Screening  Status: None   Collection Time: 09/22/15  1:52 PM  Result Value Ref Range Status   MRSA by PCR NEGATIVE NEGATIVE Final    Comment:        The GeneXpert MRSA Assay (FDA approved for NASAL specimens only), is one component of a comprehensive MRSA colonization surveillance program. It is not intended to diagnose MRSA infection nor to guide or monitor treatment for MRSA infections.   Culture, blood (routine x 2)     Status: None (Preliminary result)   Collection Time: 09/26/15  1:53 PM  Result Value Ref Range Status   Specimen Description RIGHT ANTECUBITAL  Final   Special Requests BOTTLES DRAWN AEROBIC AND ANAEROBIC PICC 6CC EACH  Final   Culture NO GROWTH < 24 HOURS  Final   Report Status PENDING  Incomplete  Culture, blood (routine x 2)     Status: None (Preliminary result)   Collection Time: 09/26/15  1:54 PM  Result Value Ref Range Status   Specimen Description LEFT ANTECUBITAL  Final   Special Requests BOTTLES DRAWN AEROBIC AND ANAEROBIC 6CC EACH  Final   Culture NO GROWTH < 24 HOURS  Final   Report Status PENDING  Incomplete     Studies: Mr Knee Left  Wo Contrast  09/27/2015  CLINICAL DATA:  Status post partial left patellectomy 07/11/2015 due to a fracture. The patient's surgical wound subsequently became infected and dehisced. Status post irrigation and debridement. Now with staphylococcal bacteremia. EXAM: MRI OF THE LEFT KNEE WITHOUT CONTRAST TECHNIQUE: Multiplanar, multisequence MR imaging of  the knee was performed. No intravenous contrast was administered. COMPARISON:  Plain films left knee 07/04/2015. FINDINGS: Surgical wound is seen anteriorly off the inferior pole the patella. There is diffuse and intense subcutaneous edema about the knee. All imaged musculature demonstrates marked atrophy. MENISCI Medial meniscus:  Intact. Lateral meniscus: There is a horizontal tear in the posterior horn of the lateral meniscus reaching the meniscal undersurface. The body of the lateral meniscus is extruded peripherally. LIGAMENTS Cruciates:  Intact. Collaterals:  Intact. CARTILAGE Patellofemoral: Extensive hyaline cartilage loss is present diffusely. Medial:  Thinned throughout. Lateral:  Thinned throughout. Joint: A small amount of joint fluid is present. Synovium about the joint appears markedly thickened. Intense edema is seen in Hoffa's fat throughout. Popliteal Fossa:  No Baker's cyst. Extensor Mechanism: The patient is status post inferior patellectomy and reconstruction of the patellar tendon. The extensor mechanism appears intact. Bones: Marrow edema is seen throughout the patella and in the medial and lateral femoral condyles and tibial plateaus. The appearance is highly suspicious for osteomyelitis. IMPRESSION: Intense subcutaneous edema and synovitis about the joint most consistent with infection. Marrow edema throughout the patella and in the tibia and femur is most consistent with osteomyelitis. No abscess is identified. Horizontal tear posterior horn lateral meniscus. Status post inferior patellectomy and reconstruction of patellar tendon. Marked atrophy of all musculature about the knee. Electronically Signed   By: Drusilla Kannerhomas  Dalessio M.D.   On: 09/27/2015 08:22   Dg Chest Port 1 View  09/26/2015  CLINICAL DATA:  Shortness of breath. EXAM: PORTABLE CHEST 1 VIEW COMPARISON:  09/21/2015 FINDINGS: The cardiac silhouette, mediastinal and hilar contours are within normal limits and stable. Low lung  volumes with vascular crowding and bibasilar atelectasis. Could not exclude a right lower lobe infiltrate. No definite pleural effusions. The right PICC line is stable. IMPRESSION: Low lung volumes with vascular crowding and bibasilar atelectasis. Could not exclude a right basilar infiltrate. Electronically Signed  By: Rudie Meyer M.D.   On: 09/26/2015 14:54    Scheduled Meds: . albuterol  2.5 mg Nebulization Q6H  . amLODipine  10 mg Oral Daily  . atenolol  50 mg Oral Daily  . benzonatate  100 mg Oral TID  . collagenase   Topical Daily  . DAPTOmycin (CUBICIN)  IV  700 mg Intravenous Q24H  . ferrous sulfate  325 mg Oral Q breakfast  . heparin  5,000 Units Subcutaneous 3 times per day  . imipramine  50 mg Oral TID  . insulin aspart  0-5 Units Subcutaneous QHS  . insulin aspart  0-9 Units Subcutaneous TID WC  . linagliptin  5 mg Oral Daily  . loratadine  10 mg Oral Daily  . potassium chloride SA  20 mEq Oral BID  . pravastatin  10 mg Oral q1800   Continuous Infusions:     Time spent: 15 minutes    Taneisha Fuson  [facility] Pager [phone]. If 7PM-7AM, please contact night-coverage at [email or web address], password [facility] 09/27/2015, 2:01 PM  LOS: 5 days

## 2015-09-28 ENCOUNTER — Inpatient Hospital Stay (HOSPITAL_COMMUNITY): Payer: Commercial Managed Care - HMO | Admitting: Anesthesiology

## 2015-09-28 ENCOUNTER — Encounter (HOSPITAL_COMMUNITY)
Admission: EM | Disposition: A | Discharge status: Skilled Nursing Facility | Payer: Self-pay | Source: Home / Self Care | Attending: Family Medicine

## 2015-09-28 DIAGNOSIS — Z89612 Acquired absence of left leg above knee: Secondary | ICD-10-CM

## 2015-09-28 DIAGNOSIS — A4101 Sepsis due to Methicillin susceptible Staphylococcus aureus: Secondary | ICD-10-CM

## 2015-09-28 DIAGNOSIS — M869 Osteomyelitis, unspecified: Secondary | ICD-10-CM

## 2015-09-28 HISTORY — PX: AMPUTATION: SHX166

## 2015-09-28 LAB — CBC
HEMATOCRIT: 30.8 % — AB (ref 36.0–46.0)
HEMOGLOBIN: 9.6 g/dL — AB (ref 12.0–15.0)
MCH: 24.3 pg — ABNORMAL LOW (ref 26.0–34.0)
MCHC: 31.2 g/dL (ref 30.0–36.0)
MCV: 78 fL (ref 78.0–100.0)
Platelets: 428 10*3/uL — ABNORMAL HIGH (ref 150–400)
RBC: 3.95 MIL/uL (ref 3.87–5.11)
RDW: 16.9 % — ABNORMAL HIGH (ref 11.5–15.5)
WBC: 20.9 10*3/uL — ABNORMAL HIGH (ref 4.0–10.5)

## 2015-09-28 LAB — PREALBUMIN: Prealbumin: 5.6 mg/dL — ABNORMAL LOW (ref 18–38)

## 2015-09-28 LAB — GLUCOSE, CAPILLARY
GLUCOSE-CAPILLARY: 165 mg/dL — AB (ref 65–99)
GLUCOSE-CAPILLARY: 192 mg/dL — AB (ref 65–99)
GLUCOSE-CAPILLARY: 176 mg/dL — AB (ref 65–99)
Glucose-Capillary: 199 mg/dL — ABNORMAL HIGH (ref 65–99)
Glucose-Capillary: 189 mg/dL — ABNORMAL HIGH (ref 65–99)

## 2015-09-28 LAB — BASIC METABOLIC PANEL
Anion gap: 8 (ref 5–15)
BUN: 28 mg/dL — AB (ref 6–20)
CALCIUM: 8.1 mg/dL — AB (ref 8.9–10.3)
CHLORIDE: 104 mmol/L (ref 101–111)
CO2: 17 mmol/L — AB (ref 22–32)
CREATININE: 1.82 mg/dL — AB (ref 0.44–1.00)
GFR calc non Af Amer: 26 mL/min — ABNORMAL LOW (ref >60)
GFR, EST AFRICAN AMERICAN: 31 mL/min — AB (ref >60)
Glucose, Bld: 170 mg/dL — ABNORMAL HIGH (ref 65–99)
Potassium: 5.9 mmol/L — ABNORMAL HIGH (ref 3.5–5.1)
SODIUM: 129 mmol/L — AB (ref 135–145)

## 2015-09-28 LAB — SEDIMENTATION RATE: SED RATE: 23 mm/h — AB (ref 0–22)

## 2015-09-28 LAB — HIV ANTIBODY (ROUTINE TESTING W REFLEX): HIV Screen 4th Generation wRfx: NONREACTIVE

## 2015-09-28 LAB — SURGICAL PCR SCREEN
MRSA, PCR: NEGATIVE
STAPHYLOCOCCUS AUREUS: NEGATIVE

## 2015-09-28 LAB — C-REACTIVE PROTEIN: CRP: 11.1 mg/dL — ABNORMAL HIGH (ref <1.0)

## 2015-09-28 SURGERY — AMPUTATION, ABOVE KNEE
Anesthesia: General | Laterality: Left

## 2015-09-28 MED ORDER — ACETAMINOPHEN 650 MG RE SUPP: 650.0000 mg | Freq: Four times a day (QID) | RECTAL | Status: DC | PRN

## 2015-09-28 MED ORDER — ONDANSETRON HCL 4 MG/2ML IJ SOLN
INTRAMUSCULAR | Status: AC
  Filled 2015-09-28: qty 2

## 2015-09-28 MED ORDER — PHENYLEPHRINE HCL 10 MG/ML IJ SOLN
INTRAMUSCULAR | Status: DC | PRN
  Administered 2015-09-28 (×2): 80 ug via INTRAVENOUS
  Administered 2015-09-28: 120 ug via INTRAVENOUS
  Administered 2015-09-28: 80 ug via INTRAVENOUS

## 2015-09-28 MED ORDER — PHENYLEPHRINE 40 MCG/ML (10ML) SYRINGE FOR IV PUSH (FOR BLOOD PRESSURE SUPPORT)
PREFILLED_SYRINGE | INTRAVENOUS | Status: AC
  Filled 2015-09-28: qty 10

## 2015-09-28 MED ORDER — SODIUM CHLORIDE 0.9 % IV SOLN
INTRAVENOUS | Status: DC
  Administered 2015-09-28 – 2015-10-02 (×2): via INTRAVENOUS

## 2015-09-28 MED ORDER — ESMOLOL HCL 100 MG/10ML IV SOLN
INTRAVENOUS | Status: AC
  Filled 2015-09-28: qty 10

## 2015-09-28 MED ORDER — LIDOCAINE HCL (CARDIAC) 20 MG/ML IV SOLN
INTRAVENOUS | Status: AC
  Filled 2015-09-28: qty 5

## 2015-09-28 MED ORDER — PHENYLEPHRINE HCL 10 MG/ML IJ SOLN
10.0000 mg | INTRAMUSCULAR | Status: DC | PRN
  Administered 2015-09-28: 60 ug/min via INTRAVENOUS

## 2015-09-28 MED ORDER — SODIUM POLYSTYRENE SULFONATE 15 GM/60ML PO SUSP
30.0000 g | Freq: Once | ORAL | Status: AC
Start: 2015-09-28 — End: 2015-09-28
  Administered 2015-09-28: 30 g via ORAL
  Filled 2015-09-28: qty 120

## 2015-09-28 MED ORDER — SODIUM CHLORIDE 0.9 % IV SOLN
INTRAVENOUS | Status: AC
  Administered 2015-09-28 – 2015-09-29 (×3): via INTRAVENOUS

## 2015-09-28 MED ORDER — METOCLOPRAMIDE HCL 5 MG/ML IJ SOLN: 5.0000 mg | Freq: Three times a day (TID) | INTRAMUSCULAR | Status: DC | PRN

## 2015-09-28 MED ORDER — HYDROMORPHONE HCL 1 MG/ML IJ SOLN: 0.2500 mg | INTRAMUSCULAR | Status: DC | PRN

## 2015-09-28 MED ORDER — HYDROMORPHONE HCL 1 MG/ML IJ SOLN
2.0000 mg | INTRAMUSCULAR | Status: DC | PRN
  Administered 2015-09-28 – 2015-09-29 (×3): 2 mg via INTRAVENOUS
  Filled 2015-09-28 (×3): qty 2

## 2015-09-28 MED ORDER — SODIUM CHLORIDE 0.9 % IV SOLN
INTRAVENOUS | Status: DC | PRN
  Administered 2015-09-28: 07:00:00 via INTRAVENOUS

## 2015-09-28 MED ORDER — PROMETHAZINE HCL 25 MG/ML IJ SOLN: 6.2500 mg | INTRAMUSCULAR | Status: DC | PRN

## 2015-09-28 MED ORDER — ALBUTEROL SULFATE HFA 108 (90 BASE) MCG/ACT IN AERS
INHALATION_SPRAY | RESPIRATORY_TRACT | Status: DC | PRN
  Administered 2015-09-28 (×2): 3 via RESPIRATORY_TRACT

## 2015-09-28 MED ORDER — ESMOLOL HCL 100 MG/10ML IV SOLN
INTRAVENOUS | Status: DC | PRN
  Administered 2015-09-28: 20 mg via INTRAVENOUS

## 2015-09-28 MED ORDER — ONDANSETRON HCL 4 MG/2ML IJ SOLN: 4.0000 mg | Freq: Four times a day (QID) | INTRAMUSCULAR | Status: DC | PRN

## 2015-09-28 MED ORDER — PROPOFOL 10 MG/ML IV BOLUS
INTRAVENOUS | Status: DC | PRN
  Administered 2015-09-28: 200 mg via INTRAVENOUS

## 2015-09-28 MED ORDER — ONDANSETRON HCL 4 MG PO TABS: 4.0000 mg | ORAL_TABLET | Freq: Four times a day (QID) | ORAL | Status: DC | PRN

## 2015-09-28 MED ORDER — ONDANSETRON HCL 4 MG/2ML IJ SOLN
INTRAMUSCULAR | Status: DC | PRN
  Administered 2015-09-28: 4 mg via INTRAVENOUS

## 2015-09-28 MED ORDER — MIDAZOLAM HCL 2 MG/2ML IJ SOLN
INTRAMUSCULAR | Status: AC
  Filled 2015-09-28: qty 4

## 2015-09-28 MED ORDER — FENTANYL CITRATE (PF) 250 MCG/5ML IJ SOLN
INTRAMUSCULAR | Status: AC
  Filled 2015-09-28: qty 5

## 2015-09-28 MED ORDER — PROPOFOL 10 MG/ML IV BOLUS
INTRAVENOUS | Status: AC
  Filled 2015-09-28: qty 20

## 2015-09-28 MED ORDER — 0.9 % SODIUM CHLORIDE (POUR BTL) OPTIME
TOPICAL | Status: DC | PRN
  Administered 2015-09-28: 300 mL

## 2015-09-28 MED ORDER — METHOCARBAMOL 1000 MG/10ML IJ SOLN
500.0000 mg | Freq: Four times a day (QID) | INTRAVENOUS | Status: DC | PRN
  Filled 2015-09-28: qty 5

## 2015-09-28 MED ORDER — METHOCARBAMOL 500 MG PO TABS: 500.0000 mg | ORAL_TABLET | Freq: Four times a day (QID) | ORAL | Status: DC | PRN

## 2015-09-28 MED ORDER — ACETAMINOPHEN 325 MG PO TABS
650.0000 mg | ORAL_TABLET | Freq: Four times a day (QID) | ORAL | Status: DC | PRN
  Administered 2015-09-30 – 2015-10-07 (×4): 650 mg via ORAL
  Filled 2015-09-28 (×4): qty 2

## 2015-09-28 MED ORDER — FENTANYL CITRATE (PF) 250 MCG/5ML IJ SOLN
INTRAMUSCULAR | Status: DC | PRN
  Administered 2015-09-28 (×2): 50 ug via INTRAVENOUS

## 2015-09-28 MED ORDER — METOCLOPRAMIDE HCL 5 MG PO TABS: 5.0000 mg | ORAL_TABLET | Freq: Three times a day (TID) | ORAL | Status: DC | PRN

## 2015-09-28 MED ORDER — LIDOCAINE HCL (CARDIAC) 20 MG/ML IV SOLN
INTRAVENOUS | Status: DC | PRN
  Administered 2015-09-28: 100 mg via INTRAVENOUS

## 2015-09-28 SURGICAL SUPPLY — 43 items
BLADE SAW RECIP 87.9 MT (BLADE) ×2 IMPLANT
BNDG COHESIVE 6X5 TAN STRL LF (GAUZE/BANDAGES/DRESSINGS) ×3 IMPLANT
BNDG GAUZE ELAST 4 BULKY (GAUZE/BANDAGES/DRESSINGS) ×1 IMPLANT
COVER SURGICAL LIGHT HANDLE (MISCELLANEOUS) ×4 IMPLANT
CUFF TOURNIQUET SINGLE 34IN LL (TOURNIQUET CUFF) IMPLANT
CUFF TOURNIQUET SINGLE 44IN (TOURNIQUET CUFF) IMPLANT
DRAIN PENROSE 1/2X12 LTX STRL (WOUND CARE) IMPLANT
DRAPE EXTREMITY T 121X128X90 (DRAPE) ×2 IMPLANT
DRAPE PROXIMA HALF (DRAPES) ×2 IMPLANT
DRAPE U-SHAPE 47X51 STRL (DRAPES) ×2 IMPLANT
DRSG ADAPTIC 3X8 NADH LF (GAUZE/BANDAGES/DRESSINGS) ×2 IMPLANT
DRSG AQUACEL AG ADV 3.5X14 (GAUZE/BANDAGES/DRESSINGS) ×1 IMPLANT
DRSG PAD ABDOMINAL 8X10 ST (GAUZE/BANDAGES/DRESSINGS) ×2 IMPLANT
DURAPREP 26ML APPLICATOR (WOUND CARE) ×2 IMPLANT
ELECT CAUTERY BLADE 6.4 (BLADE) IMPLANT
ELECT REM PT RETURN 9FT ADLT (ELECTROSURGICAL) ×2
ELECTRODE REM PT RTRN 9FT ADLT (ELECTROSURGICAL) ×1 IMPLANT
EVACUATOR 1/8 PVC DRAIN (DRAIN) IMPLANT
GAUZE SPONGE 4X4 12PLY STRL (GAUZE/BANDAGES/DRESSINGS) ×2 IMPLANT
GLOVE BIOGEL PI IND STRL 9 (GLOVE) ×1 IMPLANT
GLOVE BIOGEL PI INDICATOR 9 (GLOVE) ×1
GLOVE SURG ORTHO 9.0 STRL STRW (GLOVE) ×2 IMPLANT
GOWN STRL REUS W/ TWL XL LVL3 (GOWN DISPOSABLE) ×2 IMPLANT
GOWN STRL REUS W/TWL XL LVL3 (GOWN DISPOSABLE) ×4
KIT BASIN OR (CUSTOM PROCEDURE TRAY) ×2 IMPLANT
KIT ROOM TURNOVER OR (KITS) ×2 IMPLANT
MANIFOLD NEPTUNE II (INSTRUMENTS) ×1 IMPLANT
NS IRRIG 1000ML POUR BTL (IV SOLUTION) ×2 IMPLANT
PACK GENERAL/GYN (CUSTOM PROCEDURE TRAY) ×2 IMPLANT
PAD ARMBOARD 7.5X6 YLW CONV (MISCELLANEOUS) ×2 IMPLANT
STAPLER VISISTAT 35W (STAPLE) ×1 IMPLANT
STOCKINETTE IMPERVIOUS LG (DRAPES) ×1 IMPLANT
SUT ETHILON 2 0 PSLX (SUTURE) ×3 IMPLANT
SUT PDS AB 1 CT  36 (SUTURE)
SUT PDS AB 1 CT 36 (SUTURE) IMPLANT
SUT SILK 2 0 (SUTURE) ×2
SUT SILK 2-0 18XBRD TIE 12 (SUTURE) ×1 IMPLANT
SUT VIC AB 1 CTX 27 (SUTURE) ×2 IMPLANT
SWAB COLLECTION DEVICE MRSA (MISCELLANEOUS) IMPLANT
TOWEL OR 17X24 6PK STRL BLUE (TOWEL DISPOSABLE) ×2 IMPLANT
TOWEL OR 17X26 10 PK STRL BLUE (TOWEL DISPOSABLE) ×2 IMPLANT
TUBE ANAEROBIC SPECIMEN COL (MISCELLANEOUS) IMPLANT
WATER STERILE IRR 1000ML POUR (IV SOLUTION) ×1 IMPLANT

## 2015-09-28 NOTE — Anesthesia Procedure Notes (Signed)
Procedure Name: Intubation Date/Time: 09/28/2015 7:56 AM Performed by: Alanda AmassFRIEDMAN, Sherlonda Flater A Pre-anesthesia Checklist: Patient identified, Emergency Drugs available, Suction available, Patient being monitored and Timeout performed Patient Re-evaluated:Patient Re-evaluated prior to inductionOxygen Delivery Method: Circle system utilized Preoxygenation: Pre-oxygenation with 100% oxygen Intubation Type: IV induction Ventilation: Mask ventilation without difficulty Laryngoscope Size: Mac and 3 Grade View: Grade I Tube type: Oral Tube size: 6.5 mm Number of attempts: 1 Airway Equipment and Method: Stylet Placement Confirmation: ETT inserted through vocal cords under direct vision,  positive ETCO2 and breath sounds checked- equal and bilateral Secured at: 21 cm Tube secured with: Tape Dental Injury: Teeth and Oropharynx as per pre-operative assessment

## 2015-09-28 NOTE — Progress Notes (Signed)
[facility]    Subjective: No new complaints   Antibiotics:  Anti-infectives    Start     Dose/Rate Route Frequency Ordered Stop   09/26/15 1400  ceFAZolin (ANCEF) IVPB 2 g/50 mL premix  Status:  Discontinued     2 g 100 mL/hr over 30 Minutes Intravenous 3 times per day 09/26/15 1306 09/27/15 0851   09/25/15 1800  DAPTOmycin (CUBICIN) 700 mg in sodium chloride 0.9 % IVPB     700 mg 228 mL/hr over 30 Minutes Intravenous Every 24 hours 09/25/15 1650     09/23/15 1800  vancomycin (VANCOCIN) 1,750 mg in sodium chloride 0.9 % 500 mL IVPB  Status:  Discontinued     1,750 mg 250 mL/hr over 120 Minutes Intravenous Every 24 hours 09/23/15 1327 09/25/15 1644   09/22/15 1200  vancomycin (VANCOCIN) IVPB 1000 mg/200 mL premix  Status:  Discontinued     1,000 mg 200 mL/hr over 60 Minutes Intravenous Every 12 hours 09/22/15 0945 09/23/15 1130   09/22/15 0800  piperacillin-tazobactam (ZOSYN) IVPB 3.375 g  Status:  Discontinued     3.375 g 12.5 mL/hr over 240 Minutes Intravenous Every 8 hours 09/22/15 0159 09/25/15 1644   09/22/15 0000  vancomycin (VANCOCIN) IVPB 1000 mg/200 mL premix     1,000 mg 200 mL/hr over 60 Minutes Intravenous Every 1 hr x 2 09/21/15 2336 09/22/15 0159   09/21/15 2330  piperacillin-tazobactam (ZOSYN) IVPB 3.375 g  Status:  Discontinued     3.375 g 100 mL/hr over 30 Minutes Intravenous 3 times per day 09/21/15 2321 09/22/15 0157      Medications: Scheduled Meds: . amLODipine  10 mg Oral Daily  . atenolol  50 mg Oral Daily  . benzonatate  100 mg Oral TID  . collagenase   Topical Daily  . DAPTOmycin (CUBICIN)  IV  700 mg Intravenous Q24H  . ferrous sulfate  325 mg Oral Q breakfast  . heparin  5,000 Units Subcutaneous 3 times per day  . imipramine  50 mg Oral TID  . insulin aspart  0-5 Units Subcutaneous QHS  . insulin aspart  0-9 Units Subcutaneous TID WC  . linagliptin  5 mg Oral Daily  . loratadine  10 mg Oral  Daily  . pravastatin  10 mg Oral q1800   Continuous Infusions: . sodium chloride 10 mL/hr at 09/28/15 1201  . sodium chloride 100 mL/hr at 09/28/15 1252   PRN Meds:.acetaminophen **OR** acetaminophen, albuterol, diphenhydrAMINE, HYDROmorphone (DILAUDID) injection, methocarbamol **OR** methocarbamol (ROBAXIN)  IV, metoCLOPramide **OR** metoCLOPramide (REGLAN) injection, [DISCONTINUED] ondansetron **OR** ondansetron (ZOFRAN) IV, ondansetron **OR** ondansetron (ZOFRAN) IV, promethazine    Objective: Weight change:   Intake/Output Summary (Last 24 hours) at 09/28/15 1607 Last data filed at 09/28/15 1500  Gross per 24 hour  Intake 1130.83 ml  Output     50 ml  Net 1080.83 ml   Blood pressure 106/29, pulse 82, temperature 98.1 F (36.7 C), temperature source Oral, resp. rate 18, height 5\' 6"  (1.676 m), weight 260 lb 2.3 oz (118 kg), SpO2 96 %. Temp:  [97.9 F (36.6 C)-99.7 F (37.6 C)] 98.1 F (36.7 C) (10/23 1019) Pulse Rate:  [74-90] 82 (10/23 1019) Resp:  [14-22] 18 (10/23 1019) BP: (70-125)/(29-74) 106/29 mmHg (10/23 1019) SpO2:  [93 %-100 %] 96 % (10/23 1019) Weight:  [260 lb 2.3 oz (118 kg)] 260 lb 2.3 oz (118 kg) (10/22 2112)  Physical  Exam: General: Sleepy but arousable HEENT: anicteric sclera, EOMI, oropharynx clear and without exudate Cardiovascular: egular rate, normal r, no murmur rubs or gallops Pulmonary: clear to auscultation bilaterally, no wheezing, rales or rhonchi Gastrointestinal: soft nontender, nondistended, normal bowel sounds, Musculoskeletal: Left above-the-knee of dictation site is dressed Neuro: nonfocal, strength and sensation intact  CBC:  CBC Latest Ref Rng 09/28/2015 09/27/2015 09/27/2015  WBC 4.0 - 10.5 K/uL 20.9(H) 22.1(H) 20.6(H)  Hemoglobin 12.0 - 15.0 g/dL 0.9(W9.6(L) 1.1(B9.6(L) 1.4(N9.5(L)  Hematocrit 36.0 - 46.0 % 30.8(L) 28.7(L) 30.0(L)  Platelets 150 - 400 K/uL 428(H) 405(H) 419(H)      BMET  Recent Labs  09/27/15 2115 09/28/15 0309  NA  129* 129*  K 5.6* 5.9*  CL 103 104  CO2 19* 17*  GLUCOSE 211* 170*  BUN 27* 28*  CREATININE 1.87* 1.82*  CALCIUM 8.2* 8.1*     Liver Panel   Recent Labs  09/27/15 2115  PROT 4.9*  ALBUMIN 1.8*  AST 26  ALT 13*  ALKPHOS 68  BILITOT 0.4       Sedimentation Rate  Recent Labs  09/28/15 0431  ESRSEDRATE 23*   C-Reactive Protein  Recent Labs  09/28/15 0431  CRP 11.1*    Micro Results: Recent Results (from the past 720 hour(s))  Culture, blood (routine x 2)     Status: None   Collection Time: 09/21/15  8:16 PM  Result Value Ref Range Status   Specimen Description BLOOD A-LINE DRAW DRAWN BY RN  Final   Special Requests BOTTLES DRAWN AEROBIC AND ANAEROBIC 6CC  Final   Culture  Setup Time   Final    GRAM POSITIVE COCCI IN CLUSTERS RECOVERED FROM THE ANAEROBIC BOTTLE Gram Stain Report Called to,Read Back By and Verified With: COVINGTON,L. AT 1414 ON 09/24/2015 BY BAUGHAM,M. Performed at [facility]    Culture   Final    METHICILLIN RESISTANT STAPHYLOCOCCUS AUREUS Performed [facility]    Report Status 09/27/2015 FINAL  Final   Organism ID, Bacteria METHICILLIN RESISTANT STAPHYLOCOCCUS AUREUS  Final      Susceptibility   Methicillin resistant staphylococcus aureus - MIC*    CIPROFLOXACIN >=8 RESISTANT Resistant     ERYTHROMYCIN >=8 RESISTANT Resistant     GENTAMICIN <=0.5 SENSITIVE Sensitive     OXACILLIN >=4 RESISTANT Resistant     TETRACYCLINE <=1 SENSITIVE Sensitive     VANCOMYCIN 1 SENSITIVE Sensitive     TRIMETH/SULFA >=320 RESISTANT Resistant     CLINDAMYCIN <=0.25 SENSITIVE Sensitive     RIFAMPIN <=0.5 SENSITIVE Sensitive     Inducible Clindamycin NEGATIVE Sensitive     * METHICILLIN RESISTANT STAPHYLOCOCCUS AUREUS  Culture, blood (routine x 2)     Status: None (Preliminary result)   Collection Time: 09/21/15 11:30 PM  Result Value Ref Range Status   Specimen Description BLOOD RIGHT ANTECUBITAL  Final   Special Requests  BOTTLES DRAWN AEROBIC AND ANAEROBIC 6CC  Final   Culture  Setup Time   Final    GRAM POSITIVE COCCI IN CLUSTERS RECOVERED FROM THE AEROBIC BOTTLE Gram Stain Report Called to,Read Back By and Verified With: DAVIS,L. AT 1822 ON 09/25/2015 BY BAUGHAM,M. Performed at [facility]    Culture   Final    GRAM POSITIVE COCCI CULTURE REINCUBATED FOR BETTER GROWTH Performed at [facility]    Report Status PENDING  Incomplete  Urine culture     Status: None   Collection Time: 09/21/15 11:53 PM  Result Value Ref  Range Status   Specimen Description URINE, CLEAN CATCH  Final   Special Requests NONE  Final   Culture   Final    MULTIPLE SPECIES PRESENT, SUGGEST RECOLLECTION Performed at [facility]    Report Status 09/23/2015 FINAL  Final  MRSA PCR Screening     Status: None   Collection Time: 09/22/15  1:52 PM  Result Value Ref Range Status   MRSA by PCR NEGATIVE NEGATIVE Final    Comment:        The GeneXpert MRSA Assay (FDA approved for NASAL specimens only), is one component of a comprehensive MRSA colonization surveillance program. It is not intended to diagnose MRSA infection nor to guide or monitor treatment for MRSA infections.   Culture, blood (routine x 2)     Status: None (Preliminary result)   Collection Time: 09/26/15  1:53 PM  Result Value Ref Range Status   Specimen Description RIGHT ANTECUBITAL  Final   Special Requests BOTTLES DRAWN AEROBIC AND ANAEROBIC PICC 6CC EACH  Final   Culture NO GROWTH 2 DAYS  Final   Report Status PENDING  Incomplete  Culture, blood (routine x 2)     Status: None (Preliminary result)   Collection Time: 09/26/15  1:54 PM  Result Value Ref Range Status   Specimen Description LEFT ANTECUBITAL  Final   Special Requests BOTTLES DRAWN AEROBIC AND ANAEROBIC 6CC EACH  Final   Culture NO GROWTH 2 DAYS  Final   Report Status PENDING  Incomplete  Culture, blood (routine x 2)     Status: None (Preliminary result)   Collection  Time: 09/27/15  6:18 PM  Result Value Ref Range Status   Specimen Description BLOOD RIGHT ANTECUBITAL  Final   Special Requests BOTTLES DRAWN AEROBIC ONLY 9CC  Final   Culture NO GROWTH < 24 HOURS  Final   Report Status PENDING  Incomplete  Culture, blood (routine x 2)     Status: None (Preliminary result)   Collection Time: 09/27/15  6:25 PM  Result Value Ref Range Status   Specimen Description BLOOD LEFT ANTECUBITAL  Final   Special Requests IN PEDIATRIC BOTTLE 3CC  Final   Culture NO GROWTH < 24 HOURS  Final   Report Status PENDING  Incomplete  Surgical pcr screen     Status: None   Collection Time: 09/27/15 11:53 PM  Result Value Ref Range Status   MRSA, PCR NEGATIVE NEGATIVE Final   Staphylococcus aureus NEGATIVE NEGATIVE Final    Comment:        The Xpert SA Assay (FDA approved for NASAL specimens in patients over 24 years of age), is one component of a comprehensive surveillance program.  Test performance has been validated by [facility] for patients greater than or equal to 64 year old. It is not intended to diagnose infection nor to guide or monitor treatment.     Studies/Results: Mr Knee Left  Wo Contrast  09/27/2015  CLINICAL DATA:  Status post partial left patellectomy 07/11/2015 due to a fracture. The patient's surgical wound subsequently became infected and dehisced. Status post irrigation and debridement. Now with staphylococcal bacteremia. EXAM: MRI OF THE LEFT KNEE WITHOUT CONTRAST TECHNIQUE: Multiplanar, multisequence MR imaging of the knee was performed. No intravenous contrast was administered. COMPARISON:  Plain films left knee 07/04/2015. FINDINGS: Surgical wound is seen anteriorly off the inferior pole the patella. There is diffuse and intense subcutaneous edema about the knee. All imaged musculature demonstrates marked atrophy. MENISCI Medial meniscus:  Intact.  Lateral meniscus: There is a horizontal tear in the posterior horn of the lateral meniscus  reaching the meniscal undersurface. The body of the lateral meniscus is extruded peripherally. LIGAMENTS Cruciates:  Intact. Collaterals:  Intact. CARTILAGE Patellofemoral: Extensive hyaline cartilage loss is present diffusely. Medial:  Thinned throughout. Lateral:  Thinned throughout. Joint: A small amount of joint fluid is present. Synovium about the joint appears markedly thickened. Intense edema is seen in Hoffa's fat throughout. Popliteal Fossa:  No Baker's cyst. Extensor Mechanism: The patient is status post inferior patellectomy and reconstruction of the patellar tendon. The extensor mechanism appears intact. Bones: Marrow edema is seen throughout the patella and in the medial and lateral femoral condyles and tibial plateaus. The appearance is highly suspicious for osteomyelitis. IMPRESSION: Intense subcutaneous edema and synovitis about the joint most consistent with infection. Marrow edema throughout the patella and in the tibia and femur is most consistent with osteomyelitis. No abscess is identified. Horizontal tear posterior horn lateral meniscus. Status post inferior patellectomy and reconstruction of patellar tendon. Marked atrophy of all musculature about the knee. Electronically Signed   By: Drusilla Kanner M.D.   On: 09/27/2015 08:22      Assessment/Plan:  INTERVAL HISTORY:  09/28/2015: Patient is status post above-the-knee imitation left   Principal Problem:   HCAP (healthcare-associated pneumonia) Active Problems:   Type II diabetes mellitus (HCC)   Essential hypertension, benign   Obesity   Microcytic anemia   Wound dehiscence, surgical   Hypokalemia   Decubitus ulcer of left buttock, unstageable (HCC)   Absolute anemia   Staphylococcus aureus bacteremia   Acute renal failure (HCC)   Atelectasis   Bacterial infection of knee joint (HCC)   Staphylococcus aureus bacteremia with sepsis (HCC)   MRSA bacteremia   Chronic osteomyelitis of right femur (HCC)   Subacute  osteomyelitis of right tibia (HCC)    ROSSI SILVESTRO is a 74 y.o. 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 Specific aspects of clinical management are critical to optimizing the outcome of patients with SAB. Therefore, the [facility] Management Team [facility]) has initiated an intervention aimed at improving the management of SAB at [facility]. To do so, Infectious Diseases physicians are providing an evidence-based consult for the management of all patients with SAB.     Yes No Comments  Perform follow-up blood cultures (even if the patient is afebrile) to ensure clearance of bacteremia   And repeated cultures after PICC removal10/22/16  Remove vascular catheter and obtain follow-up blood cultures after the removal of the catheter     Perform echocardiography to evaluate for endocarditis (transthoracic ECHO is 40-50% sensitive, TEE is > 90% sensitive)   Please keep in mind, that neither test can definitively EXCLUDE endocarditis, and that should clinical suspicion remain high for endocarditis the patient should then still be treated with an "endocarditis" duration of therapy = 6 weeks  TEE should be done unless contraindication   Consult electrophysiologist to evaluate implanted cardiac device (pacemaker, ICD)    NA   Ensure source control [ ]  [ ]  Have all abscesses been drained effectively? Have  deep seeded infections (septic joints or osteomyelitis) had appropriate surgical debridement?  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  Keep in mind that for deep seeded infections MRI imaging with contrast is preferred rather than other often insensitive tests such as plain x-rays, especially early in a patient's presentation. Not clear any other sites at this time  Change antibiotic therapy to daptomycin  [ ]  [ ]  Beta-lactam antibiotics are preferred for MSSA due to higher cure rates.  If on Vancomycin, goal trough should be 15 - 20 mcg/mL  Estimated duration of IV antibiotic therapy:  6-8 weeks,  [ ]  [ ]  Consult case management for probably prolonged outpatient IV antibiotic therapy   Screening:HIV negative, HCV pending     I spent greater than 40 minutes with the patient including greater than 50% of time in face to face counsel of the patient and her is been regarding the patient's methicillin-resistant staph coccus aureus bacteremia due to her persistently infected knee now status post above-the-knee amputation and in coordination of their care.    LOS: 6 days   Acey Lav 09/28/2015, 4:07 PM

## 2015-09-28 NOTE — H&P (View-Only) (Signed)
Patient ID: Lindsay Cobb, female   DOB: 02/15/1941, 74 y.o.   MRN: [id number] I did discuss the patient's case with Dr. Roda ShuttersXU. I had a long discussion with the patient as well as separately with her husband. I discussed the only option with the extensive osteomyelitis of the femur patella and tibia would be an above-the-knee amputation. I recommended against delaying surgery. I have posted this surgery for tomorrow morning Sunday. Patient's husband will talk to the patient's sisters.

## 2015-09-28 NOTE — Progress Notes (Signed)
[facility] PROGRESS NOTE  Lindsay Cobb ZOX:[id number] DOB: [birth year] DOA: 09/21/2015 PCP: [name], MD  brief narrative 74 year old patient who was recently discharged from [facility] on 10/11 after undergoing incision and drainage of left knee by Dr. [name]. Wound cultures at that time indicated MRSA. She was discharged with a PICC line on vancomycin to skilled nursing facility. She'll return to the hospital with fevers and questionable pneumonia. She was continued on vancomycin and started on Zosyn. Blood cultures returned positive for Staphylococcus aureus. Discussed with infectious disease who recommended transfer to [name] for orthopedic evaluation of left knee since it is felt that this is the likely source of her bacteremia. PICC line has been removed and echocardiogram has been ordered. Vancomycin was discontinued in favor of daptomycin due to rising creatinine. After discussion with ID and ortho ( Dr [name]) pt transferred to [facility] for further evaluation.  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   Assessment/Plan: Sepsis with Staph aureus bacteremia Possible source is infected left knee. On IV daptomycin. Dr [name] and Dr [name] following . 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 Patient was taken to OR today and underwent left AKA. Tolerated well. continue abxx for now. Further recs for ID.  Wound dehiscence/infection left knee with recent hospitalization from 10/5-10/11  Surgical debridement performed by Dr. [name] on 10/7 with subsequent wound vac. Wound cultures at that time were positive for MRSA. Wound care nurse changed dressing on 10/17. Recommendations on last discharge were for 6 weeks of IV vancomycin. Dr [name] will see pt.  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 PT eval in am.   HCAP Afebrile. sats stable. Not in distress. continue daptomycin.     AKI Worsened renal fn.  off vanco and ACEi. Received a dose of IV lasix on 10/21 for volum overload. 2d echo with normal EF. Seems to be dehydrated. Placed on gentle hydration and monitor.   Hypotension  monitor with IV fluids.   Hyperkalemia  pt receiving scheduled k supplements . dced and ordered kayexalate. Monitor on tele.   Hyponatremia  check urine lytes. monitor with gentle hydration.   Protein calorie malnutrition  dietician consult    Code Status: full  Family Communication:none at bedside Disposition Plan: inpt    Consultants:  Dr [name]   Dr [name]     Antibiotics:  IV daptomycin  HPI/Subjective: Seen and examined after returning from OR. Pain in amputation site.  Objective: Filed Vitals:   09/28/15 1019  BP: 106/29  Pulse: 82  Temp: 98.1 F (36.7 C)  Resp: 18    Intake/Output Summary (Last 24 hours) at 09/28/15 1348 Last data filed at 09/28/15 1241  Gross per 24 hour  Intake  917.5 ml  Output     50 ml  Net  867.5 ml   Filed Weights   09/21/15 1932 09/27/15 2112  Weight: 113.399 kg (250 lb) 118 kg (260 lb 2.3 oz)    Exam:   General:  NAD  HEENT: moist mucosa, supple neck   Chest: clear b/l  CVS: NS1&S2  GI: soft, ND, NT  Musculoskeletal: left AKA site clean.   CNS: alert and oriented  Data Reviewed: Basic Metabolic Panel:  Recent Labs Lab 09/21/15 2330  09/24/15 0629 09/25/15 0601 09/26/15 0644 09/27/15 0642 09/27/15 2115 09/28/15 0309  NA  --   < > 138 134* 134* 133* 129* 129*  K  --   < > 4.3 4.4 4.7 4.8 5.6* 5.9*  CL  --   < > 105 104 104 105 103 104  CO2  --   < > 24 21* 19* 19* 19* 17*  GLUCOSE  --   < > 142* 154* 158* 170* 211* 170*  BUN  --   < > 15 20 23* 27* 27* 28*  CREATININE  --   < > 1.23* 1.46* 1.63* 1.76* 1.87* 1.82*  CALCIUM  --   < > 8.5* 8.1* 8.1* 8.2* 8.2* 8.1*  MG 1.3*  --  1.7  --   --   --   --   --   < > = values in this interval not displayed. Liver Function Tests:  Recent Labs Lab 09/23/15 0634  09/27/15 2115  AST 18 26  ALT 15 13*  ALKPHOS 59 68  BILITOT 0.4 0.4  PROT 6.4* 4.9*  ALBUMIN 2.2* 1.8*   No results for input(s): LIPASE, AMYLASE in the last 168 hours. No results for input(s): AMMONIA in the last 168 hours. CBC:  Recent Labs Lab 09/21/15 2031  09/25/15 0601 09/26/15 0644 09/27/15 0642 09/27/15 2115 09/28/15 0431  WBC 10.0  < > 15.9* 17.7* 20.6* 22.1* 20.9*  NEUTROABS 6.0  --   --   --   --  12.9*  --   HGB 8.3*  < > 9.6* 9.1* 9.5* 9.6* 9.6*  HCT 25.4*  < > 30.1* 28.4* 30.0* 28.7* 30.8*  MCV 78.2  < > 79.0 79.6 79.2 77.6* 78.0  PLT 349  < > 450* 430* 419* 405* 428*  < > = values in this interval not displayed. Cardiac Enzymes:  Recent Labs Lab 09/21/15 2031 09/27/15 1210  CKTOTAL  --  21*  TROPONINI <0.03  --    BNP (last 3 results)  Recent Labs  09/26/15 1414  BNP 48.0    ProBNP (last 3 results) No results for input(s): PROBNP in the last 8760 hours.  CBG:  Recent Labs Lab 09/27/15 1819 09/27/15 2116 09/28/15 0645 09/28/15 1027 09/28/15 1152  GLUCAP 180* 209* 165* 199* 189*    Recent Results (from the past 240 hour(s))  Culture, blood (routine x 2)     Status: None   Collection Time: 09/21/15  8:16 PM  Result Value Ref Range Status   Specimen Description BLOOD A-LINE DRAW DRAWN BY RN  Final   Special Requests BOTTLES DRAWN AEROBIC AND ANAEROBIC 6CC  Final   Culture  Setup Time   Final    GRAM POSITIVE COCCI IN CLUSTERS RECOVERED FROM THE ANAEROBIC BOTTLE Gram Stain Report Called to,Read Back By and Verified With: COVINGTON,L. AT 1414 ON 09/24/2015 BY BAUGHAM,M. Performed at [facility]    Culture   Final    METHICILLIN RESISTANT STAPHYLOCOCCUS AUREUS Performed at [facility]    Report Status 09/27/2015 FINAL  Final   Organism ID, Bacteria METHICILLIN RESISTANT STAPHYLOCOCCUS AUREUS  Final      Susceptibility   Methicillin resistant staphylococcus aureus - MIC*    CIPROFLOXACIN >=8 RESISTANT Resistant      ERYTHROMYCIN >=8 RESISTANT Resistant     GENTAMICIN <=0.5 SENSITIVE Sensitive     OXACILLIN >=4 RESISTANT Resistant     TETRACYCLINE <=1 SENSITIVE Sensitive     VANCOMYCIN 1 SENSITIVE Sensitive     TRIMETH/SULFA >=320 RESISTANT Resistant     CLINDAMYCIN <=0.25 SENSITIVE Sensitive     RIFAMPIN <=0.5 SENSITIVE Sensitive  Inducible Clindamycin NEGATIVE Sensitive     * METHICILLIN RESISTANT STAPHYLOCOCCUS AUREUS  Culture, blood (routine x 2)     Status: None (Preliminary result)   Collection Time: 09/21/15 11:30 PM  Result Value Ref Range Status   Specimen Description BLOOD RIGHT ANTECUBITAL  Final   Special Requests BOTTLES DRAWN AEROBIC AND ANAEROBIC 6CC  Final   Culture  Setup Time   Final    GRAM POSITIVE COCCI IN CLUSTERS RECOVERED FROM THE AEROBIC BOTTLE Gram Stain Report Called to,Read Back By and Verified With: DAVIS,L. AT 1822 ON 09/25/2015 BY BAUGHAM,M. Performed at [facility]    Culture   Final    GRAM POSITIVE COCCI CULTURE REINCUBATED FOR BETTER GROWTH Performed at [facility]    Report Status PENDING  Incomplete  Urine culture     Status: None   Collection Time: 09/21/15 11:53 PM  Result Value Ref Range Status   Specimen Description URINE, CLEAN CATCH  Final   Special Requests NONE  Final   Culture   Final    MULTIPLE SPECIES PRESENT, SUGGEST RECOLLECTION Performed at [facility]    Report Status 09/23/2015 FINAL  Final  MRSA PCR Screening     Status: None   Collection Time: 09/22/15  1:52 PM  Result Value Ref Range Status   MRSA by PCR NEGATIVE NEGATIVE Final    Comment:        The GeneXpert MRSA Assay (FDA approved for NASAL specimens only), is one component of a comprehensive MRSA colonization surveillance program. It is not intended to diagnose MRSA infection nor to guide or monitor treatment for MRSA infections.   Culture, blood (routine x 2)     Status: None (Preliminary result)   Collection Time: 09/26/15  1:53 PM   Result Value Ref Range Status   Specimen Description RIGHT ANTECUBITAL  Final   Special Requests BOTTLES DRAWN AEROBIC AND ANAEROBIC PICC 6CC EACH  Final   Culture NO GROWTH 2 DAYS  Final   Report Status PENDING  Incomplete  Culture, blood (routine x 2)     Status: None (Preliminary result)   Collection Time: 09/26/15  1:54 PM  Result Value Ref Range Status   Specimen Description LEFT ANTECUBITAL  Final   Special Requests BOTTLES DRAWN AEROBIC AND ANAEROBIC 6CC EACH  Final   Culture NO GROWTH 2 DAYS  Final   Report Status PENDING  Incomplete  Culture, blood (routine x 2)     Status: None (Preliminary result)   Collection Time: 09/27/15  6:18 PM  Result Value Ref Range Status   Specimen Description BLOOD RIGHT ANTECUBITAL  Final   Special Requests BOTTLES DRAWN AEROBIC ONLY 9CC  Final   Culture NO GROWTH < 24 HOURS  Final   Report Status PENDING  Incomplete  Culture, blood (routine x 2)     Status: None (Preliminary result)   Collection Time: 09/27/15  6:25 PM  Result Value Ref Range Status   Specimen Description BLOOD LEFT ANTECUBITAL  Final   Special Requests IN PEDIATRIC BOTTLE 3CC  Final   Culture NO GROWTH < 24 HOURS  Final   Report Status PENDING  Incomplete  Surgical pcr screen     Status: None   Collection Time: 09/27/15 11:53 PM  Result Value Ref Range Status   MRSA, PCR NEGATIVE NEGATIVE Final   Staphylococcus aureus NEGATIVE NEGATIVE Final    Comment:        The Xpert SA Assay (FDA approved for NASAL  specimens in patients over 56 years of age), is one component of a comprehensive surveillance program.  Test performance has been validated by [facility] for patients greater than or equal to 37 year old. It is not intended to diagnose infection nor to guide or monitor treatment.      Studies: Mr Knee Left  Wo Contrast  09/27/2015  CLINICAL DATA:  Status post partial left patellectomy 07/11/2015 due to a fracture. The patient's surgical wound subsequently  became infected and dehisced. Status post irrigation and debridement. Now with staphylococcal bacteremia. EXAM: MRI OF THE LEFT KNEE WITHOUT CONTRAST TECHNIQUE: Multiplanar, multisequence MR imaging of the knee was performed. No intravenous contrast was administered. COMPARISON:  Plain films left knee 07/04/2015. FINDINGS: Surgical wound is seen anteriorly off the inferior pole the patella. There is diffuse and intense subcutaneous edema about the knee. All imaged musculature demonstrates marked atrophy. MENISCI Medial meniscus:  Intact. Lateral meniscus: There is a horizontal tear in the posterior horn of the lateral meniscus reaching the meniscal undersurface. The body of the lateral meniscus is extruded peripherally. LIGAMENTS Cruciates:  Intact. Collaterals:  Intact. CARTILAGE Patellofemoral: Extensive hyaline cartilage loss is present diffusely. Medial:  Thinned throughout. Lateral:  Thinned throughout. Joint: A small amount of joint fluid is present. Synovium about the joint appears markedly thickened. Intense edema is seen in Hoffa's fat throughout. Popliteal Fossa:  No Baker's cyst. Extensor Mechanism: The patient is status post inferior patellectomy and reconstruction of the patellar tendon. The extensor mechanism appears intact. Bones: Marrow edema is seen throughout the patella and in the medial and lateral femoral condyles and tibial plateaus. The appearance is highly suspicious for osteomyelitis. IMPRESSION: Intense subcutaneous edema and synovitis about the joint most consistent with infection. Marrow edema throughout the patella and in the tibia and femur is most consistent with osteomyelitis. No abscess is identified. Horizontal tear posterior horn lateral meniscus. Status post inferior patellectomy and reconstruction of patellar tendon. Marked atrophy of all musculature about the knee. Electronically Signed   By: Drusilla Kanner M.D.   On: 09/27/2015 08:22   Dg Chest Port 1 View  09/26/2015   CLINICAL DATA:  Shortness of breath. EXAM: PORTABLE CHEST 1 VIEW COMPARISON:  09/21/2015 FINDINGS: The cardiac silhouette, mediastinal and hilar contours are within normal limits and stable. Low lung volumes with vascular crowding and bibasilar atelectasis. Could not exclude a right lower lobe infiltrate. No definite pleural effusions. The right PICC line is stable. IMPRESSION: Low lung volumes with vascular crowding and bibasilar atelectasis. Could not exclude a right basilar infiltrate. Electronically Signed   By: Rudie Meyer M.D.   On: 09/26/2015 14:54    Scheduled Meds: . amLODipine  10 mg Oral Daily  . atenolol  50 mg Oral Daily  . benzonatate  100 mg Oral TID  . collagenase   Topical Daily  . DAPTOmycin (CUBICIN)  IV  700 mg Intravenous Q24H  . ferrous sulfate  325 mg Oral Q breakfast  . heparin  5,000 Units Subcutaneous 3 times per day  . imipramine  50 mg Oral TID  . insulin aspart  0-5 Units Subcutaneous QHS  . insulin aspart  0-9 Units Subcutaneous TID WC  . linagliptin  5 mg Oral Daily  . loratadine  10 mg Oral Daily  . pravastatin  10 mg Oral q1800   Continuous Infusions: . sodium chloride 10 mL/hr at 09/28/15 1201  . sodium chloride 100 mL/hr at 09/28/15 1252      Time spent: 35  minutes    Eddie North  [facility] Pager [phone]. If 7PM-7AM, please contact night-coverage at [email or web address], password [facility] 09/28/2015, 1:48 PM  LOS: 6 days

## 2015-09-28 NOTE — Op Note (Signed)
09/21/2015 - 09/28/2015  8:38 AM  PATIENT:  Lindsay Cobb    PRE-OPERATIVE DIAGNOSIS:  OSTEOMYLITIS LEFT KNEE  POST-OPERATIVE DIAGNOSIS:  Same  PROCEDURE:  AMPUTATION ABOVE KNEE left  SURGEON:  Nadara MustardUDA,Nochum Fenter V, MD  PHYSICIAN ASSISTANT:None ANESTHESIA:   General  PREOPERATIVE INDICATIONS:  Lindsay SkillernHelen M Divirgilio is a  74 y.o. female with a diagnosis of OSTEOMYLITIS LEFT KNEE who failed conservative measures and elected for surgical management.    The risks benefits and alternatives were discussed with the patient preoperatively including but not limited to the risks of infection, bleeding, nerve injury, cardiopulmonary complications, the need for revision surgery, among others, and the patient was willing to proceed.  OPERATIVE IMPLANTS: None  OPERATIVE FINDINGS: Contractile muscle, no signs of abscess or infection at the amputation site, poor microcirculation, poor muscle quality.  OPERATIVE PROCEDURE: Patient was brought to the operating room and underwent a general anesthetic. After adequate levels anesthesia were obtained patient's left lower extremity was prepped using DuraPrep draped into a sterile field the wound was draped out of the sterile field with impervious stockinette. A timeout was called. A fishmouth incision was made through the mid thigh. This was carried sharply down to bone electrocautery was used for hemostasis. The medial vascular bundle was clamped and suture ligated with 2-0 silk. The remainder the amputation was then performed the bone was cut. The deep and superficial fascial layers were closed using #1 Vicryl. The skin was closed using 2-0 nylon and staples. A Aquasol dressing was applied. Patient was extubated taken to the PACU in stable condition.

## 2015-09-28 NOTE — Anesthesia Preprocedure Evaluation (Addendum)
Anesthesia Evaluation  Patient identified by MRN, date of birth, ID band Patient awake    Reviewed: Allergy & Precautions, NPO status , Patient's Chart, lab work & pertinent test results, reviewed documented beta blocker date and time   Airway Mallampati: III  TM Distance: >3 FB Neck ROM: Full    Dental  (+) Edentulous Upper, Edentulous Lower   Pulmonary    breath sounds clear to auscultation       Cardiovascular hypertension, Pt. on medications and Pt. on home beta blockers  Rhythm:Regular Rate:Normal     Neuro/Psych Anxiety Depression negative neurological ROS     GI/Hepatic negative GI ROS, Neg liver ROS,   Endo/Other  diabetes, Type 2, Oral Hypoglycemic AgentsMorbid obesity  Renal/GU Renal InsufficiencyRenal disease     Musculoskeletal  (+) Arthritis ,   Abdominal   Peds  Hematology  (+) anemia ,   Anesthesia Other Findings   Reproductive/Obstetrics                           Lab Results  Component Value Date   WBC 20.9* 09/28/2015   HGB 9.6* 09/28/2015   HCT 30.8* 09/28/2015   MCV 78.0 09/28/2015   PLT 428* 09/28/2015   Lab Results  Component Value Date   CREATININE 1.82* 09/28/2015   BUN 28* 09/28/2015   NA 129* 09/28/2015   K 5.9* 09/28/2015   CL 104 09/28/2015   CO2 17* 09/28/2015    Anesthesia Physical Anesthesia Plan  ASA: III  Anesthesia Plan: General   Post-op Pain Management:    Induction: Intravenous  Airway Management Planned: Oral ETT  Additional Equipment:   Intra-op Plan:   Post-operative Plan: Extubation in OR  Informed Consent: I have reviewed the patients History and Physical, chart, labs and discussed the procedure including the risks, benefits and alternatives for the proposed anesthesia with the patient or authorized representative who has indicated his/her understanding and acceptance.   Dental advisory given  Plan Discussed with:  CRNA  Anesthesia Plan Comments:         Anesthesia Quick Evaluation

## 2015-09-28 NOTE — Interval H&P Note (Signed)
History and Physical Interval Note:  09/28/2015 7:38 AM  [name]  has presented today for surgery, with the diagnosis of osteomelitis left knee  The various methods of treatment have been discussed with the patient and family. After consideration of risks, benefits and other options for treatment, the patient has consented to  Procedure(s): AMPUTATION ABOVE KNEE (Left) as a surgical intervention .  The patient's history has been reviewed, patient examined, no change in status, stable for surgery.  I have reviewed the patient's chart and labs.  Questions were answered to the patient's satisfaction.     Kymari Nuon V

## 2015-09-28 NOTE — Transfer of Care (Signed)
Immediate Anesthesia Transfer of Care Note  Patient: Nani SkillernHelen M Heinle  Procedure(s) Performed: Procedure(s): AMPUTATION ABOVE KNEE (Left)  Patient Location: PACU  Anesthesia Type:General  Level of Consciousness: awake and confused  Airway & Oxygen Therapy: Patient Spontanous Breathing and Patient connected to nasal cannula oxygen  Post-op Assessment: Report given to RN and Post -op Vital signs reviewed and unstable, Anesthesiologist notified  Post vital signs: Reviewed and unstable  Last Vitals:  Filed Vitals:   09/28/15 0516  BP: 123/74  Pulse: 74  Temp: 37.1 C  Resp: 18    Complications: No apparent anesthesia complications

## 2015-09-28 NOTE — Anesthesia Postprocedure Evaluation (Signed)
  Anesthesia Post-op Note  Patient: Lindsay Cobb  Procedure(s) Performed: Procedure(s): AMPUTATION ABOVE KNEE (Left)  Patient Location: PACU  Anesthesia Type:General  Level of Consciousness: awake and alert   Airway and Oxygen Therapy: Patient Spontanous Breathing  Post-op Pain: mild  Post-op Assessment: Post-op Vital signs reviewed   LLE Sensation: Full sensation          Post-op Vital Signs: Reviewed  Last Vitals:  Filed Vitals:   09/28/15 1002  BP:   Pulse: 84  Temp: 36.6 C  Resp: 16    Complications: No apparent anesthesia complications

## 2015-09-29 ENCOUNTER — Encounter (HOSPITAL_COMMUNITY): Payer: Self-pay | Admitting: Orthopedic Surgery

## 2015-09-29 ENCOUNTER — Inpatient Hospital Stay (HOSPITAL_COMMUNITY): Payer: Commercial Managed Care - HMO

## 2015-09-29 DIAGNOSIS — I950 Idiopathic hypotension: Secondary | ICD-10-CM

## 2015-09-29 DIAGNOSIS — N179 Acute kidney failure, unspecified: Secondary | ICD-10-CM | POA: Insufficient documentation

## 2015-09-29 DIAGNOSIS — M86651 Other chronic osteomyelitis, right thigh: Secondary | ICD-10-CM

## 2015-09-29 DIAGNOSIS — G9340 Encephalopathy, unspecified: Secondary | ICD-10-CM | POA: Insufficient documentation

## 2015-09-29 DIAGNOSIS — E875 Hyperkalemia: Secondary | ICD-10-CM

## 2015-09-29 LAB — GLUCOSE, CAPILLARY
GLUCOSE-CAPILLARY: 208 mg/dL — AB (ref 65–99)
GLUCOSE-CAPILLARY: 146 mg/dL — AB (ref 65–99)
GLUCOSE-CAPILLARY: 203 mg/dL — AB (ref 65–99)
Glucose-Capillary: 183 mg/dL — ABNORMAL HIGH (ref 65–99)
Glucose-Capillary: 214 mg/dL — ABNORMAL HIGH (ref 65–99)

## 2015-09-29 LAB — URINE MICROSCOPIC-ADD ON

## 2015-09-29 LAB — BASIC METABOLIC PANEL
ANION GAP: 8 (ref 5–15)
BUN: 32 mg/dL — AB (ref 6–20)
CALCIUM: 7.7 mg/dL — AB (ref 8.9–10.3)
CO2: 19 mmol/L — AB (ref 22–32)
Chloride: 105 mmol/L (ref 101–111)
Creatinine, Ser: 2.27 mg/dL — ABNORMAL HIGH (ref 0.44–1.00)
GFR calc Af Amer: 23 mL/min — ABNORMAL LOW (ref >60)
GFR, EST NON AFRICAN AMERICAN: 20 mL/min — AB (ref >60)
Glucose, Bld: 211 mg/dL — ABNORMAL HIGH (ref 65–99)
POTASSIUM: 5.7 mmol/L — AB (ref 3.5–5.1)
Sodium: 132 mmol/L — ABNORMAL LOW (ref 135–145)

## 2015-09-29 LAB — CBC
HEMATOCRIT: 30.4 % — AB (ref 36.0–46.0)
HEMOGLOBIN: 9.6 g/dL — AB (ref 12.0–15.0)
MCH: 24.7 pg — AB (ref 26.0–34.0)
MCHC: 31.6 g/dL (ref 30.0–36.0)
MCV: 78.4 fL (ref 78.0–100.0)
Platelets: 435 10*3/uL — ABNORMAL HIGH (ref 150–400)
RBC: 3.88 MIL/uL (ref 3.87–5.11)
RDW: 17.3 % — AB (ref 11.5–15.5)
WBC: 22.9 10*3/uL — ABNORMAL HIGH (ref 4.0–10.5)

## 2015-09-29 LAB — SODIUM, URINE, RANDOM: Sodium, Ur: 10 mmol/L

## 2015-09-29 LAB — HEMOGLOBIN A1C
Hgb A1c MFr Bld: 7.1 % — ABNORMAL HIGH (ref 4.8–5.6)
MEAN PLASMA GLUCOSE: 157 mg/dL

## 2015-09-29 LAB — URINALYSIS, ROUTINE W REFLEX MICROSCOPIC
Glucose, UA: NEGATIVE mg/dL
Ketones, ur: NEGATIVE mg/dL
Nitrite: NEGATIVE
PH: 5 (ref 5.0–8.0)
Protein, ur: NEGATIVE mg/dL
Specific Gravity, Urine: 1.018 (ref 1.005–1.030)
Urobilinogen, UA: 0.2 mg/dL (ref 0.0–1.0)

## 2015-09-29 LAB — OSMOLALITY, URINE: OSMOLALITY UR: 443 mosm/kg (ref 390–1090)

## 2015-09-29 LAB — CREATININE, URINE, RANDOM: CREATININE, URINE: 168.54 mg/dL

## 2015-09-29 MED ORDER — SODIUM CHLORIDE 0.9 % IV BOLUS (SEPSIS)
1000.0000 mL | Freq: Once | INTRAVENOUS | Status: AC
  Administered 2015-09-29: 1000 mL via INTRAVENOUS

## 2015-09-29 MED ORDER — HYDROMORPHONE HCL 1 MG/ML IJ SOLN
0.5000 mg | INTRAMUSCULAR | Status: DC | PRN
  Administered 2015-09-29 – 2015-09-30 (×3): 0.5 mg via INTRAVENOUS
  Filled 2015-09-29 (×3): qty 1

## 2015-09-29 MED ORDER — SODIUM POLYSTYRENE SULFONATE 15 GM/60ML PO SUSP
30.0000 g | Freq: Once | ORAL | Status: AC
Start: 2015-09-29 — End: 2015-09-29
  Administered 2015-09-29: 30 g via ORAL
  Filled 2015-09-29: qty 120

## 2015-09-29 MED ORDER — GLUCERNA SHAKE PO LIQD
237.0000 mL | Freq: Two times a day (BID) | ORAL | Status: DC
  Administered 2015-09-29 – 2015-10-04 (×9): 237 mL via ORAL

## 2015-09-29 MED ORDER — PRO-STAT SUGAR FREE PO LIQD
30.0000 mL | Freq: Two times a day (BID) | ORAL | Status: DC
  Administered 2015-09-29 – 2015-10-08 (×12): 30 mL via ORAL
  Filled 2015-09-29 (×16): qty 30

## 2015-09-29 MED ORDER — SODIUM CHLORIDE 0.9 % IV SOLN
INTRAVENOUS | Status: DC
  Administered 2015-09-29: 17:00:00 via INTRAVENOUS

## 2015-09-29 NOTE — Progress Notes (Signed)
Pharmacy Antibiotic Time-Out Note  Lindsay Cobb is a 74 y.o. year-old female admitted on 09/21/2015.  The patient is currently on daptomycin for MRSA bacteremia.  Assessment/Plan: Lindsay Cobb was on Vancomycin and Zosyn - Zosyn was stopped given culture results.  Vancomycin was changed to daptomycin due to rising creatinine. She has a baseline CK of 21 - recommend checking this weekly while on daptomycin.  Also, will stop pravastatin after discussion with Dr. Daiva EvesVan Cobb to avoid overlapping toxicities. Continue daptomycin 700mg  IV q24.   Recent Labs Lab 09/25/15 0601 09/26/15 0644 09/27/15 0642 09/27/15 2115 09/28/15 0431  WBC 15.9* 17.7* 20.6* 22.1* 20.9*    Recent Labs Lab 09/25/15 0601 09/26/15 0644 09/27/15 0642 09/27/15 2115 09/28/15 0309  CREATININE 1.46* 1.63* 1.76* 1.87* 1.82*   Estimated Creatinine Clearance: 34.9 mL/min (by C-G formula based on Cr of 1.82). Tmax/24h 98.2  Antimicrobial allergies: none  Antimicrobials: Zosyn  10/16 >> 10/20 Vancomycin  10/5 >> 10/19 (started on previous admission and continued at Lindsay Cobb center after discharge) Daptomycin 10/20>>  Microbiology Results: 10/16: blood x2 > MRSA 10/21: blood x 2 >  10/22: Blood x 2> gpc in clusters in one bottle  Thank you for allowing pharmacy to be a part of this patient's care.  Lindsay Cobb, [name] Cobb PharmD 09/29/2015 8:57 AM

## 2015-09-29 NOTE — Progress Notes (Signed)
Patient said to have gram positive cocci and clusters in pedi bottle for the blood cultures. Night coverage to be paged

## 2015-09-29 NOTE — Progress Notes (Signed)
RN made aware that 3 phlebotomist attempted to get pt's labs and unsuccessful. MD made aware. Will continue to monitor pt.

## 2015-09-29 NOTE — Progress Notes (Signed)
Patient being combative and hitting staff and cursing at husband. Foley placed and patient tried to pull it out. Wrist restraints order placed. Patient has been safety checked and offered fluids. Soft mittens applied. Husband at bedside. Orders to check morning vitals later.

## 2015-09-29 NOTE — Evaluation (Signed)
Physical Therapy Evaluation Patient Details Name: Lindsay Cobb MRN: [id number] DOB: 12-06-1941 Today's Date: 09/29/2015   History of Present Illness  74 y.o. female with L patellar fx sustained in a fall. She underwent partial patellectomy 07-11-15. Returned for infection of L knee and thigh, with irrigation and debridement before being d/c to nursing facility. 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  Clinical Impression  Patient is s/p above surgery presenting with functional limitations due to the deficits listed below (see PT Problem List). Tolerated EOB activities x 12 minutes today, focusing on progression to unsupported sitting. Needs mod-max assist for bed mobility. Limited by confusion and weakness. Patient will benefit from skilled PT to increase their independence and safety with mobility to allow discharge to the venue listed below.       Follow Up Recommendations SNF (Family requests [facility])    Equipment Recommendations  None recommended by PT    Recommendations for Other Services       Precautions / Restrictions Precautions Precautions: Fall Restrictions Weight Bearing Restrictions: Yes LLE Weight Bearing: Non weight bearing      Mobility  Bed Mobility Overal bed mobility: Needs Assistance Bed Mobility: Supine to Sit;Sit to Supine     Supine to sit: Max assist;+2 for physical assistance;HOB elevated Sit to supine: Mod assist;+2 for physical assistance   General bed mobility comments: Max assist +2 for truncal support and RLE control to bring pt to EOB, HOB was slightly elevated. Pt able to pull with LUE on rail to assist. Mod assist for LE support and +2 assist for trunk to return safely to supine.  Transfers                 General transfer comment: Not assessed due to poor cognition, safety awareness, confusion, and weakness at this time.  Ambulation/Gait                Stairs            Wheelchair Mobility     Modified Rankin (Stroke Patients Only)       Balance Overall balance assessment: Needs assistance Sitting-balance support: Single extremity supported Sitting balance-Leahy Scale: Poor Sitting balance - Comments: Tolerated sitting x12 minutes EOB with RLE supported on floor and LUE holding rail. Initially needed trunk support for posterior lean however progressed to unsupported sitting majority of time with single UE use for support on rail.                                     Pertinent Vitals/Pain Pain Assessment:  (CPOT 4/8) Pain Location: Not stated, CPOT 4/8 Pain Descriptors / Indicators: Guarding;Grimacing Pain Intervention(s): Monitored during session;Repositioned    Home Living Family/patient expects to be discharged to:: Skilled nursing facility [facility]) Living Arrangements: Spouse/significant other Available Help at Discharge: Family;Available 24 hours/day Type of Home: House Home Access: Stairs to enter Entrance Stairs-Rails: Right;Left;Can reach both Entrance Stairs-Number of Steps: 4 Home Layout: One level Home Equipment: Walker - 2 wheels;Bedside commode;Adaptive equipment Additional Comments: Patient is a poor historian. At this time, most information was pulled from previous admission.    Prior Function Level of Independence: Needs assistance   Gait / Transfers Assistance Needed: Pt does not state. Reports she does not walk or transfer [facility]     Comments: Pt is a poor historian at this time. States she was not working with  therapy at [facility].     Hand Dominance   Dominant Hand: Right    Extremity/Trunk Assessment   Upper Extremity Assessment: Defer to OT evaluation           Lower Extremity Assessment: RLE deficits/detail;LLE deficits/detail;Difficult to assess due to impaired cognition RLE Deficits / Details: Significant RLE weakness, 3+/5 knee extension, unable to flex hip against gravity in seated  position. LLE Deficits / Details: Limited assessment.      Communication   Communication: No difficulties;HOH  Cognition Arousal/Alertness: Awake/alert Behavior During Therapy: Impulsive Overall Cognitive Status: Impaired/Different from baseline Area of Impairment: Orientation;Following commands;Safety/judgement;Problem solving Orientation Level: Disoriented to;Place;Time;Situation   Memory: Decreased short-term memory Following Commands: Follows one step commands inconsistently Safety/Judgement: Decreased awareness of safety;Decreased awareness of deficits   Problem Solving: Slow processing;Difficulty sequencing;Requires verbal cues;Requires tactile cues      General Comments General comments (skin integrity, edema, etc.): Appears to have some small abrasions or skin tears on right upper and middle back. RN notified to assess.    Exercises General Exercises - Lower Extremity Long Arc Quad: Strengthening;Right;15 reps;Seated      Assessment/Plan    PT Assessment Patient needs continued PT services  PT Diagnosis Generalized weakness;Difficulty walking;Altered mental status;Acute pain   PT Problem List Decreased strength;Decreased range of motion;Decreased activity tolerance;Decreased balance;Decreased mobility;Decreased cognition;Decreased knowledge of use of DME;Decreased safety awareness;Decreased knowledge of precautions;Obesity;Pain;Decreased skin integrity  PT Treatment Interventions DME instruction;Functional mobility training;Therapeutic activities;Therapeutic exercise;Balance training;Neuromuscular re-education;Cognitive remediation;Patient/family education;Wheelchair mobility training;Modalities   PT Goals (Current goals can be found in the Care Plan section) Acute Rehab PT Goals Patient Stated Goal: to rehab then home PT Goal Formulation: Patient unable to participate in goal setting Time For Goal Achievement: 10/13/15 Potential to Achieve Goals: Fair    Frequency  Min 2X/week   Barriers to discharge Inaccessible home environment;Decreased caregiver support Requires higher level of care than family can appropriately provide.    Co-evaluation               End of Session   Activity Tolerance: Other (comment) (Limited by weakness and confusion) Patient left: in bed;with call bell/phone within reach;with bed alarm set;with family/visitor present;with SCD's reapplied Nurse Communication: Mobility status;Other (comment) (back abrasions)         Time: [id number] PT Time Calculation (min) (ACUTE ONLY): 24 min   Charges:   PT Evaluation $Initial PT Evaluation Tier I: 1 Procedure PT Treatments $Therapeutic Activity: 8-22 mins   PT G CodesBerton Mount:        Shavana Calder S 09/29/2015, 4:02 PM Sunday SpillersLogan [place]or [place], [place] [phone]

## 2015-09-29 NOTE — Progress Notes (Signed)
No urine output has been documented on patient even after surgery yesterday. Since this RN shift at 11pm, patient has not urinated. Patient was bladder scanned and found to have 481 in bladder on first scan and 641 on second scan. Night coverage has been paged- awaiting call for further actions.

## 2015-09-29 NOTE — Care Management Important Message (Addendum)
Important Message  Patient Details  Name: Nani SkillernHelen M Chaudhary MRN: [id number] Date of Birth: 02/14/1941   Medicare Important Message Given:  Yes-third notification given    RoyalAnnamarie Major, Vinnie Bobst U, RN 09/29/2015, 10:41 AM

## 2015-09-29 NOTE — Progress Notes (Addendum)
[facility] PROGRESS NOTE  Lindsay SkillernHelen M Cobb ZOX:[id number] DOB: 08/18/1941 DOA: 09/21/2015 PCP: Milinda AntisURHAM, KAWANTA, MD  brief narrative 74 year old patient who was [facility] after undergoing incision and drainage of left knee by Dr. Roda ShuttersXu. Wound cultures at that time indicated MRSA. She was discharged with a PICC line on vancomycin to skilled nursing facility. She'll return to the hospital with fevers and questionable pneumonia. She was continued on vancomycin and started on Zosyn. Blood cultures returned positive for Staphylococcus aureus. Discussed with infectious disease who recommended [facility] left knee since it is felt that this is the likely source of her bacteremia. PICC line  removed and echocardiogram done showing EF of 65-70% without wall motion abnormality or vegetation.. Vancomycin was discontinued in favor of daptomycin due to rising creatinine. After discussion with ID and ortho ( Dr Lajoyce Cornersuda) [facility].  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   Assessment/Plan: Sepsis with Staph aureus bacteremia/ wound dehiscence with extensive left knee osteomyelitis Possible source is infected left knee. On IV daptomycin. Dr Zenaida Niecevan dam and Dr Lajoyce Cornersuda following . 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 Patient underwent left AKA on 10/23. continue abx for now. Repeat Blood cultures 1/2 from 10/22 growing gram-positive cocci in clusters. Further recs for ID. Monitor leukocytosis. Check lactic acid.   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 PT eval recommend skilled nursing facility.  Ongoing sepsis with acute encephalopathy Patient very confused and combative during the night hitting the nursing staff and cursing at her husband. She had a Foley placed in that she wasn't able to void postop and  tried to pull it out. Very confused today as well. She is also on multiple medications including Dilaudid, Robaxin, Claritin and imipramine which I have all discontinued except for low dose Dilaudid. -Check UA, urine culture, ammonia, TSH and B12 level. Continue to monitor.   HCAP Afebrile. sats stable. Not in distress. continue daptomycin.   Acute kidney injury Renal function progressively worsening. Given low blood pressure suspect prerenal etiology. off vanco and ACEi. Received a dose of IV lasix on 10/21 for volum overload. 2d echo with normal EF.  Getting maintenance fluids. Will order 1 L normal saline bolus followed by continued maintenance normal saline. Check urine lites and urine osmolality. Check UA. Check renal ultrasound. Monitor urine output closely. Nephrology consulted. (Dr. Briant CedarMattingly will see patient in the morning)  Hypotension discontinued BP meds.  monitor with IV fluids.   Hyperkalemia  pt was receiving scheduled k supplements . dced and ordered kayexalate. Monitor on tele.   Hyponatremia  check urine lytes. monitor with gentle hydration.   Protein calorie malnutrition Added supplements.    Code Status: full  Family Communication: Sister at bedside Disposition Plan: Continue inpatient monitoring until sepsis and mental status resolved. Discharged to skilled nursing facility once stable.  Consultants:  Dr Zenaida NieceVan dam   Dr Lajoyce Cornersuda     Antibiotics:  IV vancomycin 10/11-10/18 ( was on 8 weeks IV vancomycin since hospital discharge on 10/11  IV Zosyn 10/17-10/20  Cefazolin 10/21-10/22  IV daptomycin 10/20--    HPI/Subjective: Seen and examined. Patient very confused and combative during the night. she remains extremely confused. Foley placed in During the night for urinary retention.  Objective: Filed Vitals:   09/29/15 0901  BP: 95/70  Pulse: 87  Temp:  98.3 F (36.8 C)  Resp: 18    Intake/Output Summary (Last 24 hours) at 09/29/15  1621 Last data filed at 09/29/15 1305  Gross per 24 hour  Intake    940 ml  Output    501 ml  Net    439 ml   Filed Weights   09/21/15 1932 09/27/15 2112 09/28/15 2035  Weight: 113.399 kg (250 lb) 118 kg (260 lb 2.3 oz) 112.1 kg (247 lb 2.2 oz)    Exam:   General: Elderly female lying in bed  HEENT: moist mucosa, supple neck   Chest: clear b/l  CVS: NS1&S2  GI: soft, ND, NT, bowel sounds present, Foley placed on 10/23  Musculoskeletal: left AKA site clean.   CNS: Alert and awake but extremely confused  Data Reviewed: Basic Metabolic Panel:  Recent Labs Lab 09/24/15 0629  09/26/15 0644 09/27/15 0642 09/27/15 2115 09/28/15 0309 09/29/15 0503  NA 138  < > 134* 133* 129* 129* 132*  K 4.3  < > 4.7 4.8 5.6* 5.9* 5.7*  CL 105  < > 104 105 103 104 105  CO2 24  < > 19* 19* 19* 17* 19*  GLUCOSE 142*  < > 158* 170* 211* 170* 211*  BUN 15  < > 23* 27* 27* 28* 32*  CREATININE 1.23*  < > 1.63* 1.76* 1.87* 1.82* 2.27*  CALCIUM 8.5*  < > 8.1* 8.2* 8.2* 8.1* 7.7*  MG 1.7  --   --   --   --   --   --   < > = values in this interval not displayed. Liver Function Tests:  Recent Labs Lab 09/23/15 0634 09/27/15 2115  AST 18 26  ALT 15 13*  ALKPHOS 59 68  BILITOT 0.4 0.4  PROT 6.4* 4.9*  ALBUMIN 2.2* 1.8*   No results for input(s): LIPASE, AMYLASE in the last 168 hours. No results for input(s): AMMONIA in the last 168 hours. CBC:  Recent Labs Lab 09/26/15 0644 09/27/15 0642 09/27/15 2115 09/28/15 0431 09/29/15 0503  WBC 17.7* 20.6* 22.1* 20.9* 22.9*  NEUTROABS  --   --  12.9*  --   --   HGB 9.1* 9.5* 9.6* 9.6* 9.6*  HCT 28.4* 30.0* 28.7* 30.8* 30.4*  MCV 79.6 79.2 77.6* 78.0 78.4  PLT 430* 419* 405* 428* 435*   Cardiac Enzymes:  Recent Labs Lab 09/27/15 1210  CKTOTAL 21*   BNP (last 3 results)  Recent Labs  09/26/15 1414  BNP 48.0    ProBNP (last 3 results) No results for input(s): PROBNP in the last 8760 hours.  CBG:  Recent Labs Lab  09/28/15 1152 09/28/15 1606 09/28/15 2128 09/29/15 0932 09/29/15 1131  GLUCAP 189* 192* 176* 214* 208*    Recent Results (from the past 240 hour(s))  Culture, blood (routine x 2)     Status: None   Collection Time: 09/21/15  8:16 PM  Result Value Ref Range Status   Specimen Description BLOOD A-LINE DRAW DRAWN BY RN  Final   Special Requests BOTTLES DRAWN AEROBIC AND ANAEROBIC 6CC  Final   Culture  Setup Time   Final    GRAM POSITIVE COCCI IN CLUSTERS RECOVERED FROM THE ANAEROBIC BOTTLE Gram Stain Report Called to,Read Back By and Verified With: COVINGTON,L. AT 1414 ON 09/24/2015 BY BAUGHAM,M. Performed at [facility]    Culture   Final    METHICILLIN RESISTANT STAPHYLOCOCCUS AUREUS Performed at [facility]    Report Status 09/27/2015 FINAL  Final  Organism ID, Bacteria METHICILLIN RESISTANT STAPHYLOCOCCUS AUREUS  Final      Susceptibility   Methicillin resistant staphylococcus aureus - MIC*    CIPROFLOXACIN >=8 RESISTANT Resistant     ERYTHROMYCIN >=8 RESISTANT Resistant     GENTAMICIN <=0.5 SENSITIVE Sensitive     OXACILLIN >=4 RESISTANT Resistant     TETRACYCLINE <=1 SENSITIVE Sensitive     VANCOMYCIN 1 SENSITIVE Sensitive     TRIMETH/SULFA >=320 RESISTANT Resistant     CLINDAMYCIN <=0.25 SENSITIVE Sensitive     RIFAMPIN <=0.5 SENSITIVE Sensitive     Inducible Clindamycin NEGATIVE Sensitive     * METHICILLIN RESISTANT STAPHYLOCOCCUS AUREUS  Culture, blood (routine x 2)     Status: None (Preliminary result)   Collection Time: 09/21/15 11:30 PM  Result Value Ref Range Status   Specimen Description BLOOD RIGHT ANTECUBITAL  Final   Special Requests BOTTLES DRAWN AEROBIC AND ANAEROBIC 6CC  Final   Culture  Setup Time   Final    GRAM POSITIVE COCCI IN CLUSTERS RECOVERED FROM THE AEROBIC BOTTLE Gram Stain Report Called to,Read Back By and Verified With: DAVIS,L. AT 1822 ON 09/25/2015 BY BAUGHAM,M. Performed at [facility]    Culture   Final     STAPHYLOCOCCUS AUREUS Performed at [facility]    Report Status PENDING  Incomplete  Urine culture     Status: None   Collection Time: 09/21/15 11:53 PM  Result Value Ref Range Status   Specimen Description URINE, CLEAN CATCH  Final   Special Requests NONE  Final   Culture   Final    MULTIPLE SPECIES PRESENT, SUGGEST RECOLLECTION Performed at [facility]    Report Status 09/23/2015 FINAL  Final  MRSA PCR Screening     Status: None   Collection Time: 09/22/15  1:52 PM  Result Value Ref Range Status   MRSA by PCR NEGATIVE NEGATIVE Final    Comment:        The GeneXpert MRSA Assay (FDA approved for NASAL specimens only), is one component of a comprehensive MRSA colonization surveillance program. It is not intended to diagnose MRSA infection nor to guide or monitor treatment for MRSA infections.   Culture, blood (routine x 2)     Status: None (Preliminary result)   Collection Time: 09/26/15  1:53 PM  Result Value Ref Range Status   Specimen Description RIGHT ANTECUBITAL  Final   Special Requests BOTTLES DRAWN AEROBIC AND ANAEROBIC PICC 6CC EACH  Final   Culture NO GROWTH 3 DAYS  Final   Report Status PENDING  Incomplete  Culture, blood (routine x 2)     Status: None (Preliminary result)   Collection Time: 09/26/15  1:54 PM  Result Value Ref Range Status   Specimen Description LEFT ANTECUBITAL  Final   Special Requests BOTTLES DRAWN AEROBIC AND ANAEROBIC 6CC EACH  Final   Culture NO GROWTH 3 DAYS  Final   Report Status PENDING  Incomplete  Culture, blood (routine x 2)     Status: None (Preliminary result)   Collection Time: 09/27/15  6:18 PM  Result Value Ref Range Status   Specimen Description BLOOD RIGHT ANTECUBITAL  Final   Special Requests BOTTLES DRAWN AEROBIC ONLY 9CC  Final   Culture NO GROWTH 2 DAYS  Final   Report Status PENDING  Incomplete  Culture, blood (routine x 2)     Status: None (Preliminary result)   Collection Time: 09/27/15  6:25 PM   Result Value Ref Range Status  Specimen Description BLOOD LEFT ANTECUBITAL  Final   Special Requests IN PEDIATRIC BOTTLE 3CC  Final   Culture  Setup Time   Final    GRAM POSITIVE COCCI IN CLUSTERS IN PEDIATRIC BOTTLE CRITICAL RESULT CALLED TO, READ BACK BY AND VERIFIED WITH: Bonnee Quin [id number] WILDERK    Culture GRAM POSITIVE COCCI  Final   Report Status PENDING  Incomplete  Surgical pcr screen     Status: None   Collection Time: 09/27/15 11:53 PM  Result Value Ref Range Status   MRSA, PCR NEGATIVE NEGATIVE Final   Staphylococcus aureus NEGATIVE NEGATIVE Final    Comment:        The Xpert SA Assay (FDA approved for NASAL specimens in patients over 64 years of age), is one component of a comprehensive surveillance program.  Test performance has been validated by [facility] for patients greater than or equal to 91 year old. It is not intended to diagnose infection nor to guide or monitor treatment.      Studies: No results found.  Scheduled Meds: . amLODipine  10 mg Oral Daily  . atenolol  50 mg Oral Daily  . collagenase   Topical Daily  . DAPTOmycin (CUBICIN)  IV  700 mg Intravenous Q24H  . feeding supplement (GLUCERNA SHAKE)  237 mL Oral BID BM  . feeding supplement (PRO-STAT SUGAR FREE 64)  30 mL Oral BID  . ferrous sulfate  325 mg Oral Q breakfast  . heparin  5,000 Units Subcutaneous 3 times per day  . insulin aspart  0-5 Units Subcutaneous QHS  . insulin aspart  0-9 Units Subcutaneous TID WC  . linagliptin  5 mg Oral Daily  . loratadine  10 mg Oral Daily  . sodium chloride  1,000 mL Intravenous Once  . sodium polystyrene  30 g Oral Once   Continuous Infusions: . sodium chloride 10 mL/hr at 09/28/15 1201  . sodium chloride        Time spent: 35 minutes    Dhrithi Riche  [facility] Pager [phone]. If 7PM-7AM, please contact night-coverage at [email or web address], password [facility] 09/29/2015, 4:21 PM  LOS: 7 days

## 2015-09-29 NOTE — Progress Notes (Signed)
Patient ID: Lindsay Cobb, female   DOB: 01/09/1941, 74 y.o.   MRN: [id number] Postoperative day 1 above-the-knee amputation on the left. Patient is extremely agitated this morning. I had a long discussion with her husband. Plan for discharge to skilled nursing.
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
## 2015-09-29 NOTE — Clinical Documentation Improvement (Signed)
Hospitalist  Can the diagnosis of systemic infection be further specified? If Sepsis is present, please specify if Sepsis was present on admission in the progress notes and discharge summary.   Sepsis - specify causative organism if known  Determine if there is Severe Sepsis (Sepsis with organ dysfunction - specify), Septic Shock if present  Identify etiology of Sepsis - Device, Implant, Graft, Infusion, Abortion  Specify organ dysfunction - Respiratory Failure, Encephalopathy, Acute Kidney Failure, Pneumonia, UTI, Other (specify), Unable to Clinically Determine}  Other  Clinically Undetermined  Document any associated diagnoses/conditions.   Supporting Information: Staphylococcus aureus bacteremia with sepsis (HCC) per 10/23 progress notes.  10/16:  Lactic acid: 1.23.  Please exercise your independent, professional judgment when responding. A specific answer is not anticipated or expected.   Thank Sabino DonovanYou,  Ching [facility]ideau [facility] [employer] [facility] [place] [phone]

## 2015-09-29 NOTE — Progress Notes (Addendum)
Initial Nutrition Assessment  DOCUMENTATION CODES:   Obesity unspecified  INTERVENTION:  Provide Glucerna Shake po BID, each supplement provides 220 kcal and 10 grams of protein.  Provide 30 ml Prostat po BID, each supplement provides 100 kcal and 15 grams of protein.   Encourage adequate PO intake.   NUTRITION DIAGNOSIS:   Increased nutrient needs related to wound healing as evidenced by estimated needs.  GOAL:   Patient will meet greater than or equal to 90% of their needs  MONITOR:   PO intake, Supplement acceptance, Weight trends, Labs, I & O's, Skin  REASON FOR ASSESSMENT:   Consult Wound healing  ASSESSMENT:   74 year old patient who was [facility]11 after undergoing incision and drainage of left knee by Dr. Roda ShuttersXu. Wound cultures at that time indicated MRSA. She was discharged with a PICC line on vancomycin to skilled nursing facility. She'll return to the hospital with fevers and questionable pneumonia. Blood cultures returned positive for Staphylococcus aureus. 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  Pt reports appetite has been good. Meal completion has been varied from 25-100%. Pt reports PTA she was eating fine with no other difficulties. Weight has been stable prior to recent amputation. Pt is agreeable to nutritional supplements to aid in wound healing. RD to order. Pt was encouraged to eat her food at meals. Pt with no observed significant fat or muscle mass loss.   Labs: Low sodium, CO2, calcium, GFR. High potassium, BUN, and creatinine.   Diet Order:  Diet Carb Modified Fluid consistency:: Thin; Room service appropriate?: Yes  Skin:  Wound (see comment) (Stage II pressure ulcer on sacrum, incision on L leg)  Last BM:  10/21  Height:   Ht Readings from Last 1 Encounters:  09/21/15 5\' 6"  (1.676 m)    Weight:   Wt Readings from Last 1 Encounters:  09/28/15 247 lb 2.2 oz (112.1 kg)    Ideal Body Weight:   54.28 kg (Adjusted for L AKA)  BMI:  Body mass index is 39.91 kg/(m^2).  Estimated Nutritional Needs:   Kcal:  1900-2100  Protein:  110-125 grams  Fluid:  1.9 - 2.1 L/day  EDUCATION NEEDS:   No education needs identified at this time  Roslyn SmilingStephanie Alicyn Klann, MS, RD, LDN Pager # [phone] After hours/ weekend pager # [phone]

## 2015-09-30 LAB — CULTURE, BLOOD (ROUTINE X 2)

## 2015-09-30 LAB — BASIC METABOLIC PANEL
Anion gap: 8 (ref 5–15)
BUN: 30 mg/dL — AB (ref 6–20)
CHLORIDE: 105 mmol/L (ref 101–111)
CO2: 17 mmol/L — AB (ref 22–32)
CREATININE: 1.87 mg/dL — AB (ref 0.44–1.00)
Calcium: 7.1 mg/dL — ABNORMAL LOW (ref 8.9–10.3)
GFR calc non Af Amer: 26 mL/min — ABNORMAL LOW (ref >60)
GFR, EST AFRICAN AMERICAN: 30 mL/min — AB (ref >60)
GLUCOSE: 199 mg/dL — AB (ref 65–99)
Potassium: 4.5 mmol/L (ref 3.5–5.1)
Sodium: 130 mmol/L — ABNORMAL LOW (ref 135–145)

## 2015-09-30 LAB — CBC
HCT: 28.2 % — ABNORMAL LOW (ref 36.0–46.0)
Hemoglobin: 9 g/dL — ABNORMAL LOW (ref 12.0–15.0)
MCH: 24.7 pg — AB (ref 26.0–34.0)
MCHC: 31.9 g/dL (ref 30.0–36.0)
MCV: 77.3 fL — AB (ref 78.0–100.0)
PLATELETS: 374 10*3/uL (ref 150–400)
RBC: 3.65 MIL/uL — AB (ref 3.87–5.11)
RDW: 17.5 % — ABNORMAL HIGH (ref 11.5–15.5)
WBC: 22.2 10*3/uL — ABNORMAL HIGH (ref 4.0–10.5)

## 2015-09-30 LAB — GLUCOSE, CAPILLARY
GLUCOSE-CAPILLARY: 172 mg/dL — AB (ref 65–99)
Glucose-Capillary: 169 mg/dL — ABNORMAL HIGH (ref 65–99)
Glucose-Capillary: 205 mg/dL — ABNORMAL HIGH (ref 65–99)

## 2015-09-30 LAB — VITAMIN B12: Vitamin B-12: 1608 pg/mL — ABNORMAL HIGH (ref 180–914)

## 2015-09-30 LAB — TSH: TSH: 0.528 u[IU]/mL (ref 0.350–4.500)

## 2015-09-30 LAB — AMMONIA: Ammonia: 53 umol/L — ABNORMAL HIGH (ref 9–35)

## 2015-09-30 MED ORDER — SODIUM CHLORIDE 0.9 % IV SOLN
INTRAVENOUS | Status: DC
  Administered 2015-09-30: 08:00:00 via INTRAVENOUS

## 2015-09-30 MED ORDER — SODIUM CHLORIDE 0.9 % IV SOLN
INTRAVENOUS | Status: DC
  Administered 2015-09-30 – 2015-10-01 (×2): via INTRAVENOUS

## 2015-09-30 MED ORDER — LACTULOSE 10 GM/15ML PO SOLN
20.0000 g | Freq: Two times a day (BID) | ORAL | Status: DC
  Administered 2015-09-30 – 2015-10-08 (×13): 20 g via ORAL
  Filled 2015-09-30 (×17): qty 30

## 2015-09-30 NOTE — Progress Notes (Signed)
[facility] PROGRESS NOTE  Lindsay SkillernHelen M Cobb BJY:[id number] DOB: 10/09/1941 DOA: 09/21/2015 PCP: Milinda AntisURHAM, KAWANTA, MD  brief narrative [id number] ? Recent d/c from [facility] on 10/11 after undergoing incision and drainage of left knee by Dr. Roda ShuttersXu.  Wound cultures at that time indicated MRSA. She was discharged with a PICC line on vancomycin to skilled nursing facility. She'll return to the hospital with fevers and questionable pneumonia. She was continued on vancomycin and started on Zosyn. Blood cultures returned positive for Staphylococcus aureus. Discussed with infectious disease who recommended [facility] evaluation of left knee since it is felt that this is the likely source of her bacteremia.  PICC line  removed and echocardiogram done showing EF of 65-70% without wall motion abnormality or vegetation..  Vancomycin was discontinued in favor of daptomycin due to rising creatinine.  After discussion with ID and ortho ( Dr Lajoyce Cornersuda) pt transferred to [facility] for [facility] evaluation.   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   Assessment/Plan: Sepsis with Staph aureus bacteremia/ wound dehiscence with extensive left knee osteomyelitis Possible source is infected left knee  On IV daptomycin.  Dr Zenaida Niecevan dam and Dr Lajoyce Cornersuda following .  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 continue abx for now. Repeat Blood cultures 1/2 from 10/22 growing MRSA still. Further recs for ID. Monitor leukocytosis.   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 PT eval recommend skilled nursing facility.  Ongoing sepsis with acute encephalopathy noted 10/24 Patient very confused and combative during the night hitting the nursing staff and cursing at her husband. Still somewhat confused 10/25 She is also on multiple medications including Dilaudid, Robaxin, Claritin and imipramine which I have all discontinued except for low dose  Dilaudid. Ammonia elevated 10/24-Added Lactulose 10/25 TSH and B12 level. Continue to monitor.  HCAP Afebrile. sats stable. Not in distress. continue daptomycin.  Acute kidney injury Renal function slowly improving Baseline creat 0.8 suspect prerenal etiology. off vanco and ACEi.  Received a dose of IV lasix on 10/21 for volum overload. 2d echo with normal EF.  FeNa= 0.1 indicative pre-renal cause UA bl;and with mild leuks  renal ultrasound unremarkeable but not sensitive enough given habitus.  Strict i/o Nephrology consulted appreciated  Hypotension discontinued BP meds.  monitor with IV fluids.   Hyperkalemia  pt was receiving scheduled k supplements . dced and ordered kayexalate. Monitor on tele.  Hyponatremia Stable in 130 range Normal for this patient between 134-140  Protein calorie malnutrition Added supplements.    Code Status: full  Family Communication:   called husband at below #, no answer called niece and updated Lindsay Cobb,Lindsay Spouse [phone]      [name],Lindsay Niece   [phone]   Disposition Plan: Continue inpatient monitoring until sepsis and mental status resolved. Discharged to skilled nursing facility once stable.  Consultants:  Dr Zenaida NieceVan dam   Dr Lajoyce Cornersuda  Antibiotics:  IV vancomycin 10/11-10/18 ( was on 8 weeks IV vancomycin since hospital discharge on 10/11  IV Zosyn 10/17-10/20  Cefazolin 10/21-10/22  IV daptomycin 10/20--   HPI/Subjective:  Somewhat confused Nursing reports no other issues but confusion No cp/n/v   Objective: Filed Vitals:   09/30/15 1100  BP: 137/51  Pulse: 93  Temp: 98.9 F (37.2 C)  Resp: 20    Intake/Output Summary (Last 24 hours) at 09/30/15 1400 Last data filed at 09/30/15 0919  Gross per 24 hour  Intake    840 ml  Output    300 ml  Net    540 ml   Filed Weights   09/27/15 2112 09/28/15 2035 09/29/15 2039  Weight: 118 kg (260 lb 2.3 oz) 112.1 kg (247 lb 2.2 oz) 113 kg (249 lb 1.9 oz)     Exam:   General: Elderly female lying in bed  HEENT: moist mucosa, supple neck   Chest: clear b/l  CVS: NS1&S2  GI: soft, ND, NT, bowel sounds present, Foley placed on 10/23  Musculoskeletal: left AKA site clean.   CNS: Alert and awake-fairly. confused  Data Reviewed: Basic Metabolic Panel:  Recent Labs Lab 09/24/15 0629  09/27/15 1191 09/27/15 2115 09/28/15 0309 09/29/15 0503 09/30/15 0610  NA 138  < > 133* 129* 129* 132* 130*  K 4.3  < > 4.8 5.6* 5.9* 5.7* 4.5  CL 105  < > 105 103 104 105 105  CO2 24  < > 19* 19* 17* 19* 17*  GLUCOSE 142*  < > 170* 211* 170* 211* 199*  BUN 15  < > 27* 27* 28* 32* 30*  CREATININE 1.23*  < > 1.76* 1.87* 1.82* 2.27* 1.87*  CALCIUM 8.5*  < > 8.2* 8.2* 8.1* 7.7* 7.1*  MG 1.7  --   --   --   --   --   --   < > = values in this interval not displayed. Liver Function Tests:  Recent Labs Lab 09/27/15 2115  AST 26  ALT 13*  ALKPHOS 68  BILITOT 0.4  PROT 4.9*  ALBUMIN 1.8*   No results for input(s): LIPASE, AMYLASE in the last 168 hours.  Recent Labs Lab 09/30/15 0950  AMMONIA 53*   CBC:  Recent Labs Lab 09/27/15 0642 09/27/15 2115 09/28/15 0431 09/29/15 0503 09/30/15 0610  WBC 20.6* 22.1* 20.9* 22.9* 22.2*  NEUTROABS  --  12.9*  --   --   --   HGB 9.5* 9.6* 9.6* 9.6* 9.0*  HCT 30.0* 28.7* 30.8* 30.4* 28.2*  MCV 79.2 77.6* 78.0 78.4 77.3*  PLT 419* 405* 428* 435* 374   Cardiac Enzymes:  Recent Labs Lab 09/27/15 1210  CKTOTAL 21*   BNP (last 3 results)  Recent Labs  09/26/15 1414  BNP 48.0    ProBNP (last 3 results) No results for input(s): PROBNP in the last 8760 hours.  CBG:  Recent Labs Lab 09/29/15 1131 09/29/15 1702 09/29/15 2024 09/30/15 0732 09/30/15 1142  GLUCAP 208* 146* 203* 172* 169*    Recent Results (from the past 240 hour(s))  Culture, blood (routine x 2)     Status: None   Collection Time: 09/21/15  8:16 PM  Result Value Ref Range Status   Specimen Description BLOOD  A-LINE DRAW DRAWN BY RN  Final   Special Requests BOTTLES DRAWN AEROBIC AND ANAEROBIC 6CC  Final   Culture  Setup Time   Final    GRAM POSITIVE COCCI IN CLUSTERS RECOVERED FROM THE ANAEROBIC BOTTLE Gram Stain Report Called to,Read Back By and Verified With: COVINGTON,L. AT 1414 ON 09/24/2015 BY BAUGHAM,M. Performed at [facility]    Culture   Final    METHICILLIN RESISTANT STAPHYLOCOCCUS AUREUS Performed at [facility]    Report Status 09/27/2015 FINAL  Final   Organism ID, Bacteria METHICILLIN RESISTANT STAPHYLOCOCCUS AUREUS  Final      Susceptibility   Methicillin resistant staphylococcus aureus - MIC*    CIPROFLOXACIN >=8 RESISTANT Resistant     ERYTHROMYCIN >=  8 RESISTANT Resistant     GENTAMICIN <=0.5 SENSITIVE Sensitive     OXACILLIN >=4 RESISTANT Resistant     TETRACYCLINE <=1 SENSITIVE Sensitive     VANCOMYCIN 1 SENSITIVE Sensitive     TRIMETH/SULFA >=320 RESISTANT Resistant     CLINDAMYCIN <=0.25 SENSITIVE Sensitive     RIFAMPIN <=0.5 SENSITIVE Sensitive     Inducible Clindamycin NEGATIVE Sensitive     * METHICILLIN RESISTANT STAPHYLOCOCCUS AUREUS  Culture, blood (routine x 2)     Status: None (Preliminary result)   Collection Time: 09/21/15 11:30 PM  Result Value Ref Range Status   Specimen Description BLOOD RIGHT ANTECUBITAL  Final   Special Requests BOTTLES DRAWN AEROBIC AND ANAEROBIC 6CC  Final   Culture  Setup Time   Final    GRAM POSITIVE COCCI IN CLUSTERS RECOVERED FROM THE AEROBIC BOTTLE Gram Stain Report Called to,Read Back By and Verified With: DAVIS,L. AT 1822 ON 09/25/2015 BY BAUGHAM,M. Performed at [facility]    Culture   Final    METHICILLIN RESISTANT STAPHYLOCOCCUS AUREUS Performed at [facility]    Report Status PENDING  Incomplete   Organism ID, Bacteria METHICILLIN RESISTANT STAPHYLOCOCCUS AUREUS  Final      Susceptibility   Methicillin resistant staphylococcus aureus - MIC*    CIPROFLOXACIN >=8 RESISTANT Resistant      ERYTHROMYCIN >=8 RESISTANT Resistant     GENTAMICIN <=0.5 SENSITIVE Sensitive     OXACILLIN >=4 RESISTANT Resistant     TETRACYCLINE <=1 SENSITIVE Sensitive     VANCOMYCIN <=0.5 SENSITIVE Sensitive     TRIMETH/SULFA 160 RESISTANT Resistant     CLINDAMYCIN <=0.25 SENSITIVE Sensitive     RIFAMPIN <=0.5 SENSITIVE Sensitive     Inducible Clindamycin NEGATIVE Sensitive     * METHICILLIN RESISTANT STAPHYLOCOCCUS AUREUS  Urine culture     Status: None   Collection Time: 09/21/15 11:53 PM  Result Value Ref Range Status   Specimen Description URINE, CLEAN CATCH  Final   Special Requests NONE  Final   Culture   Final    MULTIPLE SPECIES PRESENT, SUGGEST RECOLLECTION Performed at [facility]    Report Status 09/23/2015 FINAL  Final  MRSA PCR Screening     Status: None   Collection Time: 09/22/15  1:52 PM  Result Value Ref Range Status   MRSA by PCR NEGATIVE NEGATIVE Final    Comment:        The GeneXpert MRSA Assay (FDA approved for NASAL specimens only), is one component of a comprehensive MRSA colonization surveillance program. It is not intended to diagnose MRSA infection nor to guide or monitor treatment for MRSA infections.   Culture, blood (routine x 2)     Status: None (Preliminary result)   Collection Time: 09/26/15  1:53 PM  Result Value Ref Range Status   Specimen Description RIGHT ANTECUBITAL  Final   Special Requests BOTTLES DRAWN AEROBIC AND ANAEROBIC PICC 6CC EACH  Final   Culture NO GROWTH 3 DAYS  Final   Report Status PENDING  Incomplete  Culture, blood (routine x 2)     Status: None (Preliminary result)   Collection Time: 09/26/15  1:54 PM  Result Value Ref Range Status   Specimen Description LEFT ANTECUBITAL  Final   Special Requests BOTTLES DRAWN AEROBIC AND ANAEROBIC 6CC EACH  Final   Culture NO GROWTH 3 DAYS  Final   Report Status PENDING  Incomplete  Culture, blood (routine x 2)     Status: None (Preliminary  result)   Collection Time: 09/27/15   6:18 PM  Result Value Ref Range Status   Specimen Description BLOOD RIGHT ANTECUBITAL  Final   Special Requests BOTTLES DRAWN AEROBIC ONLY 9CC  Final   Culture NO GROWTH 2 DAYS  Final   Report Status PENDING  Incomplete  Culture, blood (routine x 2)     Status: None   Collection Time: 09/27/15  6:25 PM  Result Value Ref Range Status   Specimen Description BLOOD LEFT ANTECUBITAL  Final   Special Requests IN PEDIATRIC BOTTLE 3CC  Final   Culture  Setup Time   Final    GRAM POSITIVE COCCI IN CLUSTERS IN PEDIATRIC BOTTLE CRITICAL RESULT CALLED TO, READ BACK BY AND VERIFIED WITH: Bonnee Quin [id number] WILDERK    Culture   Final    METHICILLIN RESISTANT STAPHYLOCOCCUS AUREUS SUSCEPTIBILITIES PERFORMED ON PREVIOUS CULTURE WITHIN THE LAST 5 DAYS.    Report Status 09/30/2015 FINAL  Final  Surgical pcr screen     Status: None   Collection Time: 09/27/15 11:53 PM  Result Value Ref Range Status   MRSA, PCR NEGATIVE NEGATIVE Final   Staphylococcus aureus NEGATIVE NEGATIVE Final    Comment:        The Xpert SA Assay (FDA approved for NASAL specimens in patients over 8 years of age), is one component of a comprehensive surveillance program.  Test performance has been validated by [facility] for patients greater than or equal to 50 year old. It is not intended to diagnose infection nor to guide or monitor treatment.   Culture, blood (routine x 2)     Status: None (Preliminary result)   Collection Time: 09/29/15  4:20 PM  Result Value Ref Range Status   Specimen Description BLOOD RIGHT HAND  Final   Special Requests IN PEDIATRIC BOTTLE 3CC  Final   Culture PENDING  Incomplete   Report Status PENDING  Incomplete  Urine culture     Status: None (Preliminary result)   Collection Time: 09/29/15  4:46 PM  Result Value Ref Range Status   Specimen Description URINE, CATHETERIZED  Final   Special Requests NONE  Final   Culture TOO YOUNG TO READ  Final   Report Status PENDING   Incomplete     Studies: US Renal  09/29/2015  CLINICAL DATA:  Acute renal injury. EXAM: RENAL / URINARY TRACT ULTRASOUND COMPLETE COMPARISON:  None. FINDINGS: Extremely limited exam due to body habitus. Right Kidney: Length: 9.8 cm.  No definite hydronephrosis. Left Kidney: Length: 9.5 cm.  No definite hydronephrosis. Bladder: Not visualized. IMPRESSION: Extremely limited examination due to body habitus. No definite hydronephrosis. Electronically Signed   By: Annia Belt M.D.   On: 09/29/2015 18:49    Scheduled Meds: . collagenase   Topical Daily  . DAPTOmycin (CUBICIN)  IV  700 mg Intravenous Q24H  . feeding supplement (GLUCERNA SHAKE)  237 mL Oral BID BM  . feeding supplement (PRO-STAT SUGAR FREE 64)  30 mL Oral BID  . ferrous sulfate  325 mg Oral Q breakfast  . heparin  5,000 Units Subcutaneous 3 times per day  . insulin aspart  0-5 Units Subcutaneous QHS  . insulin aspart  0-9 Units Subcutaneous TID WC  . linagliptin  5 mg Oral Daily   Continuous Infusions: . sodium chloride 10 mL/hr at 09/28/15 1201  . sodium chloride 75 mL/hr at 09/30/15 1015      Time spent: 35 minutes  Pleas Koch, MD [facility] [phone]

## 2015-09-30 NOTE — Progress Notes (Signed)
Patient ID: Nani SkillernHelen M [place], female   DOB: 10/02/1941, 74 y.o.   MRN: [id number] Patient alert and oriented this morning. Much improvement from yesterday. Anticipate discharge to skilled nursing.

## 2015-09-30 NOTE — Consult Note (Addendum)
Lindsay Cobb is an 74 y.o. female referred by Dr Gonzella Lex   Chief Complaint: ARF HPI: 74yo BF with hx L partial patellectomy 8/16 that became infected with MRSA for which she received several surgeries and IV vanco.  She underwent Lt AKA on 09/28/15.  Baseline renal fx normal as Scr .8 on 09/21/15.  Scr increased to 1.23 09/24/15 and progressively increased to 2.27 on 09/29/15.  Today Scr 1.87.  Had been on vanco til 09/24/15 when changed to daptomycin. Over last 2 months the highest vanco level I see is 27 on 09/13/15.  UO not well recorded.  She has had int hypotension over the last fews days with SBP as low as the 70's, better now. UNA 10.  Has not received contrast. Renal US limited study due to body habitus but unremarkable.  Hx DM 10-61yrs and HTN > 56yrs.  Past Medical History  Diagnosis Date  . Diabetes mellitus     takes Januvia,Metformin,and Glipizide daily  . Hyperlipidemia     takes Lovastatin nightly  . Anemia     takes Ferrous Gluconate daily  . Peripheral edema     takes Furosemide daily as needed  . Depression     takes Tofranil daily  . Hypertension     takes Amlodipine,Atenolol,and Benazepril daily  . Arthritis   . Joint pain   . Joint swelling   . Cataracts, bilateral     immature  . Infection of skin of knee     left    Past Surgical History  Procedure Laterality Date  . Breast biopsy Left   . Colonoscopy    . Esophagogastroduodenoscopy    . Ercp    . Patellectomy Left 07/11/2015    Procedure: Left Partial PATELLECTOMY;  Surgeon: Tarry Kos, MD;  Location: [facility] OR;  Service: Orthopedics;  Laterality: Left;  . Irrigation and debridement knee Left 08/15/2015    Procedure: IRRIGATION AND DEBRIDEMENT LEFT KNEE;  Surgeon: Tarry Kos, MD;  Location: [facility] OR;  Service: Orthopedics;  Laterality: Left;  . Patellectomy Left 08/15/2015    Procedure: Revision PATELLECTOMY;  Surgeon: Tarry Kos, MD;  Location: [facility] OR;  Service: Orthopedics;  Laterality: Left;  . I&d  extremity Left 08/18/2015    Procedure: IRRIGATION AND DEBRIDEMENT EXTREMITY;  Surgeon: Tarry Kos, MD;  Location: [facility] OR;  Service: Orthopedics;  Laterality: Left;  Marland Kitchen Minor application of wound vac  08/18/2015    Procedure: MINOR APPLICATION OF WOUND VAC;  Surgeon: Tarry Kos, MD;  Location: [facility] OR;  Service: Orthopedics;;  . Irrigation and debridement knee Left 08/20/2015    Procedure: IRRIGATION AND DEBRIDEMENT KNEE WITH PLACEMENT OF INTEGRA;  Surgeon: Tarry Kos, MD;  Location: [facility] OR;  Service: Orthopedics;  Laterality: Left;  . I&d extremity Left 09/10/2015    Procedure: LEFT KNEE SPLIT THICKNESS SKIN GRAFT, IRRIGATION AND DEBRIDEMENT, WOUND VAC;  Surgeon: Tarry Kos, MD;  Location: [facility] OR;  Service: Orthopedics;  Laterality: Left;  . Skin split graft Left 09/10/2015    Procedure: SKIN GRAFT SPLIT THICKNESS;  Surgeon: Tarry Kos, MD;  Location: [facility];  Service: Orthopedics;  Laterality: Left;  . Application of wound vac Left 09/10/2015    Procedure: APPLICATION OF WOUND VAC;  Surgeon: Tarry Kos, MD;  Location: [facility];  Service: Orthopedics;  Laterality: Left;  . I&d extremity Left 09/12/2015    Procedure: IRRIGATION AND DEBRIDEMENT LEFT KNEE WITH PLACEMENT OF WOUND VAC;  Surgeon: Tarry Kos,  MD;  Location: [facility] OR;  Service: Orthopedics;  Laterality: Left;  . Amputation Left 09/28/2015    Procedure: AMPUTATION ABOVE KNEE;  Surgeon: Nadara MustardMarcus Duda V, MD;  Location: [facility] OR;  Service: Orthopedics;  Laterality: Left;    Family History  Problem Relation Age of Onset  . Hypertension Mother   . Diabetes Mother   . Hypertension Father    No Fh of renal ds  Social History:  reports that she has never smoked. She has never used smokeless tobacco. She reports that she does not drink alcohol or use illicit drugs. Married, [place].  No kids Allergies: No Known Allergies  Medications Prior to Admission  Medication Sig Dispense Refill  . amLODipine (NORVASC) 10 MG tablet  TAKE 1 TABLET EVERY DAY 90 tablet 1  . aspirin EC 325 MG tablet Take 1 tablet (325 mg total) by mouth daily. 84 tablet 0  . atenolol (TENORMIN) 50 MG tablet Take 1 tablet (50 mg total) by mouth daily. 90 tablet 1  . benazepril (LOTENSIN) 20 MG tablet TAKE 1 TABLET EVERY DAY 90 tablet 1  . ferrous sulfate 325 (65 FE) MG tablet Take 325 mg by mouth daily with breakfast.   0  . furosemide (LASIX) 20 MG tablet Take 1-2 tablets once a day as needed for leg swelling (Patient taking differently: Take 20-40 mg by mouth daily. Take 1 tablet (20 mg) by mouth daily, may take an additional tablet (20 mg) as needed for feet swelling) 180 tablet 1  . glipiZIDE (GLUCOTROL) 10 MG tablet TAKE 1 TABLET TWICE DAILY BEFORE A MEAL 180 tablet 1  . imipramine (TOFRANIL) 50 MG tablet Take 1 tablet (50 mg total) by mouth 3 (three) times daily. 270 tablet 1  . JANUVIA 25 MG tablet TAKE ONE TABLET BY MOUTH ONCE DAILY (Patient taking differently: TAKE ONE TABLET BY MOUTH ONCE DAILY WITH BREAKFAST) 30 tablet 3  . lovastatin (MEVACOR) 10 MG tablet Take 1 tablet (10 mg total) by mouth at bedtime. 90 tablet 2  . metFORMIN (GLUCOPHAGE) 1000 MG tablet TAKE 1 TABLET TWICE DAILY WITH A MEAL (Patient taking differently: Take 1,000 mg by mouth 2 (two) times daily with a meal. ) 180 tablet 1  . oxyCODONE (OXY IR/ROXICODONE) 5 MG immediate release tablet Take 1-3 tablets (5-15 mg total) by mouth every 4 (four) hours as needed. (Patient taking differently: Take 10 mg by mouth every 4 (four) hours as needed for moderate pain or severe pain. ) 180 tablet 0  . [EXPIRED] vancomycin (VANCOCIN) 1 GM/200ML SOLN Inject 200 mLs (1,000 mg total) into the vein every 12 (twelve) hours. (Patient taking differently: Inject 750 mg into the vein every 12 (twelve) hours. ) 200 mL 41  . glucose blood (ACCU-CHEK AVIVA) test strip Use to monitor FSBS 1x daily. Dx: E11.9 50 each 6     Lab Results: UA: 7-10 rbc's, 3-6 wbc's  Neg protein   Recent Labs   09/28/15 0431 09/29/15 0503 09/30/15 0610  WBC 20.9* 22.9* 22.2*  HGB 9.6* 9.6* 9.0*  HCT 30.8* 30.4* 28.2*  PLT 428* 435* 374   BMET  Recent Labs  09/28/15 0309 09/29/15 0503 09/30/15 0610  NA 129* 132* 130*  K 5.9* 5.7* 4.5  CL 104 105 105  CO2 17* 19* 17*  GLUCOSE 170* 211* 199*  BUN 28* 32* 30*  CREATININE 1.82* 2.27* 1.87*  CALCIUM 8.1* 7.7* 7.1*   LFT  Recent Labs  09/27/15 2115  PROT 4.9*  ALBUMIN 1.8*  AST 26  ALT 13*  ALKPHOS 68  BILITOT 0.4   US Renal  09/29/2015  CLINICAL DATA:  Acute renal injury. EXAM: RENAL / URINARY TRACT ULTRASOUND COMPLETE COMPARISON:  None. FINDINGS: Extremely limited exam due to body habitus. Right Kidney: Length: 9.8 cm.  No definite hydronephrosis. Left Kidney: Length: 9.5 cm.  No definite hydronephrosis. Bladder: Not visualized. IMPRESSION: Extremely limited examination due to body habitus. No definite hydronephrosis. Electronically Signed   By: Annia Belt M.D.   On: 09/29/2015 18:49    ROS: No change in vision No SOB NO cp No abd Pain Some pain Lt leg from surgery No dysuria Denies neuropathic Sxs  PHYSICAL EXAM: Blood pressure 106/87, pulse 93, temperature 98 F (36.7 C), temperature source Oral, resp. rate 18, height  (1.676 m), weight 113 kg (249 lb 1.9 oz), SpO2 97 %. HEENT: PERRLA EOMI NECK:No JVD LUNGS:Clear ant CARDIAC:RRR wo MRG ABD:+ BS NT ND No HSM EXT:Lt AKA with bandage over wound.  Tr edema RLE and arm NEURO:CNI Ox3 no asterixis  Assessment: 1. ARF sec MRSA infection and hypotension,  Scr trending down. 2. MRSA bacteremia 3. Lt knee infection SP AKA 4. Hx HTN 5. DM PLAN: 1. I/O's 2. Daily Scr 3. Avoid further nephrotoxins 4. Cont IV fluids but decrease rate to 75cc/hr 5. Anticipate renal fx to return to normal  Travanti Mcmanus T 09/30/2015, 9:42 AM

## 2015-09-30 NOTE — Progress Notes (Signed)
B12, ammonia, and TSH levels reordered this morning per previous electronic order because lab was unable to obtain blood yesterday evening.  Lab made aware.

## 2015-10-01 LAB — GLUCOSE, CAPILLARY
GLUCOSE-CAPILLARY: 175 mg/dL — AB (ref 65–99)
GLUCOSE-CAPILLARY: 132 mg/dL — AB (ref 65–99)
GLUCOSE-CAPILLARY: 136 mg/dL — AB (ref 65–99)
Glucose-Capillary: 179 mg/dL — ABNORMAL HIGH (ref 65–99)
Glucose-Capillary: 174 mg/dL — ABNORMAL HIGH (ref 65–99)

## 2015-10-01 LAB — CULTURE, BLOOD (ROUTINE X 2)
CULTURE: NO GROWTH
CULTURE: NO GROWTH

## 2015-10-01 LAB — COMPREHENSIVE METABOLIC PANEL
ALK PHOS: 81 U/L (ref 38–126)
ALT: 13 U/L — AB (ref 14–54)
AST: 21 U/L (ref 15–41)
Albumin: 1.4 g/dL — ABNORMAL LOW (ref 3.5–5.0)
Anion gap: 7 (ref 5–15)
BUN: 31 mg/dL — AB (ref 6–20)
CO2: 19 mmol/L — AB (ref 22–32)
Calcium: 7 mg/dL — ABNORMAL LOW (ref 8.9–10.3)
Chloride: 105 mmol/L (ref 101–111)
Creatinine, Ser: 1.54 mg/dL — ABNORMAL HIGH (ref 0.44–1.00)
GFR calc Af Amer: 37 mL/min — ABNORMAL LOW (ref >60)
GFR calc non Af Amer: 32 mL/min — ABNORMAL LOW (ref >60)
GLUCOSE: 170 mg/dL — AB (ref 65–99)
POTASSIUM: 3.7 mmol/L (ref 3.5–5.1)
SODIUM: 131 mmol/L — AB (ref 135–145)
TOTAL PROTEIN: 3.9 g/dL — AB (ref 6.5–8.1)
Total Bilirubin: 0.5 mg/dL (ref 0.3–1.2)

## 2015-10-01 LAB — CBC WITH DIFFERENTIAL/PLATELET
Basophils Absolute: 0.1 10*3/uL (ref 0.0–0.1)
Basophils Relative: 0 %
EOS ABS: 5.4 10*3/uL — AB (ref 0.0–0.7)
EOS PCT: 23 %
HCT: 27.1 % — ABNORMAL LOW (ref 36.0–46.0)
HEMOGLOBIN: 8.7 g/dL — AB (ref 12.0–15.0)
LYMPHS ABS: 2 10*3/uL (ref 0.7–4.0)
LYMPHS PCT: 9 %
MCH: 25.1 pg — AB (ref 26.0–34.0)
MCHC: 32.1 g/dL (ref 30.0–36.0)
MCV: 78.3 fL (ref 78.0–100.0)
Monocytes Absolute: 1.8 10*3/uL — ABNORMAL HIGH (ref 0.1–1.0)
Monocytes Relative: 8 %
NEUTROS PCT: 60 %
Neutro Abs: 13.7 10*3/uL — ABNORMAL HIGH (ref 1.7–7.7)
PLATELETS: 364 10*3/uL (ref 150–400)
RBC: 3.46 MIL/uL — AB (ref 3.87–5.11)
RDW: 17.9 % — ABNORMAL HIGH (ref 11.5–15.5)
WBC: 23 10*3/uL — AB (ref 4.0–10.5)

## 2015-10-01 LAB — URINE CULTURE

## 2015-10-01 NOTE — Clinical Social Work Note (Signed)
Patient from [facility] skilled facility. CSW will follow up with patient/family to determine d/c plan.  Genelle BalVanessa Hagen Tidd, MSW, LCSW Licensed Clinical Social Worker Clinical Social Work Department [facility] [phone]

## 2015-10-01 NOTE — Progress Notes (Signed)
[facility] PROGRESS NOTE  Lindsay Cobb XLK:[id number] DOB: [birth year] DOA: 09/21/2015 PCP: [name], MD  brief narrative  74 ? Recent d/c from [facility] on 10/11 after undergoing incision and drainage of left knee by Dr. [name].  Wound cultures at that time indicated MRSA. She was discharged with a PICC line on vancomycin to skilled nursing facility. She'll return to the hospital with fevers and questionable pneumonia.  She was continued on vancomycin and started on Zosyn.  Blood cultures returned positive for Staphylococcus aureus.  Discussed with infectious disease who recommended transfer to [name] for orthopedic evaluation of left knee since it is felt that this is the likely source of her bacteremia. PICC line removed and echocardiogram done showing EF of 65-70% without wall motion abnormality or vegetation. Vancomycin was discontinued in favor of daptomycin due to rising creatinine.  After discussion with ID and ortho ( Dr [name]) pt transferred to [facility] for further evaluation.   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   Assessment/Plan:  Sepsis with Staph aureus bacteremia/ wound dehiscence with extensive left knee osteomyelitis Possible source is infected left knee On IV daptomycin.  Dr [name] and Dr [name] following .  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 continue abx for now. Repeat Blood cultures 1/2 from 10/22 growing MRSA  Culture bottle 1/2 10/24 with Gr+ cocci Echo 09/26/15 noted suboptimal windows D/w Dr. Daiva Eves He recommneds TEE.  Contacting Cardiologist to arrange Monitor leukocytosis.   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  PT eval recommend skilled nursing facility.  Ongoing sepsis with acute encephalopathy noted 10/24 Patient very confused  rsolved after d/c Dilaudid, Robaxin, Claritin and imipramine which I have all discontinued except for low dose  Dilaudid. Ammonia elevated 10/24 -Added Lactulose 10/25 which she should conitnue on-going TSH and B12 level. Continue to monitor.  HCAP Afebrile. sats stable. Not in distress. continue daptomycin.  Acute kidney injury Renal function much improved Creat 2.2->1.87.  Patient's basleine 0.8 suspect prerenal etiology. off vanco and ACEi.  Received a dose of IV lasix on 10/21 for volum overload. 2d echo with normal EF.  FeNa= 0.1 indicative pre-renal cause UA bland with mild leuks  renal ultrasound unremarkeable but not sensitive enough given habitus.  Strict i/o Nephrology consulted appreciated  Hypotension discontinued BP meds.  monitor with IV fluids.   Hyperkalemia  pt was receiving scheduled k supplements .  dc'ed and ordered kayexalate. resolved  Hyponatremia Stable in 130 range Normal for this patient between 134-140  Protein calorie malnutrition Added supplements.    Code Status: full  Family Communication:  Sister at bedside updated Disposition Plan: Continue inpatient monitoring until sepsis and mental status resolved. Discharged to skilled nursing facility once stable.  Consultants:  Dr [name]   Dr [name]  Antibiotics:  IV vancomycin 10/11-10/18 ( was on 8 weeks IV vancomycin since hospital discharge on 10/11  IV Zosyn 10/17-10/20  Cefazolin 10/21-10/22  IV daptomycin 10/20--   HPI/Subjective:  Mentation much improved No new issues tol diet well Had stool No fever no chills no n nor vomiting Per sister seems to be at her regular baseline  Objective: Filed Vitals:   10/01/15 0836  BP: 129/54  Pulse: 98  Temp: 98.2 F (36.8 C)  Resp: 18    Intake/Output Summary (Last 24 hours) at 10/01/15 1313 Last data filed at 10/01/15 1049  Gross per 24  hour  Intake 2315.25 ml  Output    425 ml  Net 1890.25 ml   Filed Weights   09/27/15 2112 09/28/15 2035 09/29/15 2039  Weight: 118 kg (260 lb 2.3 oz) 112.1 kg (247 lb 2.2 oz) 113 kg (249 lb  1.9 oz)    Exam:   General: Elderly female lying in bed  HEENT: moist mucosa, supple neck   Chest: clear b/l  CVS: NS1&S2  GI: soft, ND, NT, bowel sounds present, Foley placed on 10/23  Musculoskeletal: left AKA site bandage appears clean.  No tenderness to surrounding skin no redness   CNS: Alert and awake-fairly alert and awake compared to prior  Data Reviewed: Basic Metabolic Panel:  Recent Labs Lab 09/27/15 2115 09/28/15 0309 09/29/15 0503 09/30/15 0610 10/01/15 0454  NA 129* 129* 132* 130* 131*  K 5.6* 5.9* 5.7* 4.5 3.7  CL 103 104 105 105 105  CO2 19* 17* 19* 17* 19*  GLUCOSE 211* 170* 211* 199* 170*  BUN 27* 28* 32* 30* 31*  CREATININE 1.87* 1.82* 2.27* 1.87* 1.54*  CALCIUM 8.2* 8.1* 7.7* 7.1* 7.0*   Liver Function Tests:  Recent Labs Lab 09/27/15 2115 10/01/15 0454  AST 26 21  ALT 13* 13*  ALKPHOS 68 81  BILITOT 0.4 0.5  PROT 4.9* 3.9*  ALBUMIN 1.8* 1.4*   No results for input(s): LIPASE, AMYLASE in the last 168 hours.  Recent Labs Lab 09/30/15 0950  AMMONIA 53*   CBC:  Recent Labs Lab 09/27/15 2115 09/28/15 0431 09/29/15 0503 09/30/15 0610 10/01/15 0454  WBC 22.1* 20.9* 22.9* 22.2* 23.0*  NEUTROABS 12.9*  --   --   --  13.7*  HGB 9.6* 9.6* 9.6* 9.0* 8.7*  HCT 28.7* 30.8* 30.4* 28.2* 27.1*  MCV 77.6* 78.0 78.4 77.3* 78.3  PLT 405* 428* 435* 374 364   Cardiac Enzymes:  Recent Labs Lab 09/27/15 1210  CKTOTAL 21*   BNP (last 3 results)  Recent Labs  09/26/15 1414  BNP 48.0    ProBNP (last 3 results) No results for input(s): PROBNP in the last 8760 hours.  CBG:  Recent Labs Lab 09/30/15 1142 09/30/15 1718 09/30/15 2139 10/01/15 0747 10/01/15 1147  GLUCAP 169* 205* 179* 174* 175*    Recent Results (from the past 240 hour(s))  Culture, blood (routine x 2)     Status: None   Collection Time: 09/21/15  8:16 PM  Result Value Ref Range Status   Specimen Description BLOOD A-LINE DRAW DRAWN BY RN  Final    Special Requests BOTTLES DRAWN AEROBIC AND ANAEROBIC 6CC  Final   Culture  Setup Time   Final    GRAM POSITIVE COCCI IN CLUSTERS RECOVERED FROM THE ANAEROBIC BOTTLE Gram Stain Report Called to,Read Back By and Verified With: COVINGTON,L. AT 1414 ON 09/24/2015 BY BAUGHAM,M. Performed at [facility]    Culture   Final    METHICILLIN RESISTANT STAPHYLOCOCCUS AUREUS Performed at [facility]    Report Status 09/27/2015 FINAL  Final   Organism ID, Bacteria METHICILLIN RESISTANT STAPHYLOCOCCUS AUREUS  Final      Susceptibility   Methicillin resistant staphylococcus aureus - MIC*    CIPROFLOXACIN >=8 RESISTANT Resistant     ERYTHROMYCIN >=8 RESISTANT Resistant     GENTAMICIN <=0.5 SENSITIVE Sensitive     OXACILLIN >=4 RESISTANT Resistant     TETRACYCLINE <=1 SENSITIVE Sensitive     VANCOMYCIN 1 SENSITIVE Sensitive     TRIMETH/SULFA >=320 RESISTANT Resistant  CLINDAMYCIN <=0.25 SENSITIVE Sensitive     RIFAMPIN <=0.5 SENSITIVE Sensitive     Inducible Clindamycin NEGATIVE Sensitive     * METHICILLIN RESISTANT STAPHYLOCOCCUS AUREUS  Culture, blood (routine x 2)     Status: None   Collection Time: 09/21/15 11:30 PM  Result Value Ref Range Status   Specimen Description BLOOD RIGHT ANTECUBITAL  Final   Special Requests BOTTLES DRAWN AEROBIC AND ANAEROBIC 6CC  Final   Culture  Setup Time   Final    GRAM POSITIVE COCCI IN CLUSTERS RECOVERED FROM THE AEROBIC BOTTLE Gram Stain Report Called to,Read Back By and Verified With: DAVIS,L. AT 1822 ON 09/25/2015 BY BAUGHAM,M. Performed at [facility]    Culture   Final    METHICILLIN RESISTANT STAPHYLOCOCCUS AUREUS Performed at [facility]    Report Status 10/01/2015 FINAL  Final   Organism ID, Bacteria METHICILLIN RESISTANT STAPHYLOCOCCUS AUREUS  Final      Susceptibility   Methicillin resistant staphylococcus aureus - MIC*    CIPROFLOXACIN >=8 RESISTANT Resistant     ERYTHROMYCIN >=8 RESISTANT Resistant      GENTAMICIN <=0.5 SENSITIVE Sensitive     OXACILLIN >=4 RESISTANT Resistant     TETRACYCLINE <=1 SENSITIVE Sensitive     VANCOMYCIN <=0.5 SENSITIVE Sensitive     TRIMETH/SULFA 160 RESISTANT Resistant     CLINDAMYCIN <=0.25 SENSITIVE Sensitive     RIFAMPIN <=0.5 SENSITIVE Sensitive     Inducible Clindamycin NEGATIVE Sensitive     * METHICILLIN RESISTANT STAPHYLOCOCCUS AUREUS  Urine culture     Status: None   Collection Time: 09/21/15 11:53 PM  Result Value Ref Range Status   Specimen Description URINE, CLEAN CATCH  Final   Special Requests NONE  Final   Culture   Final    MULTIPLE SPECIES PRESENT, SUGGEST RECOLLECTION Performed at [facility]    Report Status 09/23/2015 FINAL  Final  MRSA PCR Screening     Status: None   Collection Time: 09/22/15  1:52 PM  Result Value Ref Range Status   MRSA by PCR NEGATIVE NEGATIVE Final    Comment:        The GeneXpert MRSA Assay (FDA approved for NASAL specimens only), is one component of a comprehensive MRSA colonization surveillance program. It is not intended to diagnose MRSA infection nor to guide or monitor treatment for MRSA infections.   Culture, blood (routine x 2)     Status: None   Collection Time: 09/26/15  1:53 PM  Result Value Ref Range Status   Specimen Description BLOOD PICC LINE DRAWN BY RN  Final   Special Requests BOTTLES DRAWN AEROBIC AND ANAEROBIC 6CC EACH  Final   Culture NO GROWTH 5 DAYS  Final   Report Status 10/01/2015 FINAL  Final  Culture, blood (routine x 2)     Status: None   Collection Time: 09/26/15  1:54 PM  Result Value Ref Range Status   Specimen Description BLOOD LEFT ANTECUBITAL  Final   Special Requests BOTTLES DRAWN AEROBIC AND ANAEROBIC 6CC EACH  Final   Culture NO GROWTH 5 DAYS  Final   Report Status 10/01/2015 FINAL  Final  Culture, blood (routine x 2)     Status: None (Preliminary result)   Collection Time: 09/27/15  6:18 PM  Result Value Ref Range Status   Specimen Description  BLOOD RIGHT ANTECUBITAL  Final   Special Requests BOTTLES DRAWN AEROBIC ONLY 9CC  Final   Culture NO GROWTH 3 DAYS  Final  Report Status PENDING  Incomplete  Culture, blood (routine x 2)     Status: None   Collection Time: 09/27/15  6:25 PM  Result Value Ref Range Status   Specimen Description BLOOD LEFT ANTECUBITAL  Final   Special Requests IN PEDIATRIC BOTTLE 3CC  Final   Culture  Setup Time   Final    GRAM POSITIVE COCCI IN CLUSTERS IN PEDIATRIC BOTTLE CRITICAL RESULT CALLED TO, READ BACK BY AND VERIFIED WITH: Bonnee Quin [id number] WILDERK    Culture   Final    METHICILLIN RESISTANT STAPHYLOCOCCUS AUREUS SUSCEPTIBILITIES PERFORMED ON PREVIOUS CULTURE WITHIN THE LAST 5 DAYS.    Report Status 09/30/2015 FINAL  Final  Surgical pcr screen     Status: None   Collection Time: 09/27/15 11:53 PM  Result Value Ref Range Status   MRSA, PCR NEGATIVE NEGATIVE Final   Staphylococcus aureus NEGATIVE NEGATIVE Final    Comment:        The Xpert SA Assay (FDA approved for NASAL specimens in patients over 45 years of age), is one component of a comprehensive surveillance program.  Test performance has been validated by [facility] for patients greater than or equal to 80 year old. It is not intended to diagnose infection nor to guide or monitor treatment.   Culture, blood (routine x 2)     Status: None (Preliminary result)   Collection Time: 09/29/15  3:21 PM  Result Value Ref Range Status   Specimen Description BLOOD RIGHT HAND  Final   Special Requests BOTTLES DRAWN AEROBIC ONLY 5CC  Final   Culture NO GROWTH < 24 HOURS  Final   Report Status PENDING  Incomplete  Culture, blood (routine x 2)     Status: None (Preliminary result)   Collection Time: 09/29/15  4:20 PM  Result Value Ref Range Status   Specimen Description BLOOD RIGHT HAND  Final   Special Requests IN PEDIATRIC BOTTLE 3CC  Final   Culture  Setup Time   Final    GRAM POSITIVE COCCI IN CLUSTERS AEROBIC BOTTLE  ONLY CRITICAL RESULT CALLED TO, READ BACK BY AND VERIFIED WITH: EMMANUAL  10/01/15 MKELLY    Culture NO GROWTH < 24 HOURS  Final   Report Status PENDING  Incomplete  Urine culture     Status: None   Collection Time: 09/29/15  4:46 PM  Result Value Ref Range Status   Specimen Description URINE, CATHETERIZED  Final   Special Requests NONE  Final   Culture MULTIPLE SPECIES PRESENT, SUGGEST RECOLLECTION  Final   Report Status 10/01/2015 FINAL  Final     Studies: US Renal  09/29/2015  CLINICAL DATA:  Acute renal injury. EXAM: RENAL / URINARY TRACT ULTRASOUND COMPLETE COMPARISON:  None. FINDINGS: Extremely limited exam due to body habitus. Right Kidney: Length: 9.8 cm.  No definite hydronephrosis. Left Kidney: Length: 9.5 cm.  No definite hydronephrosis. Bladder: Not visualized. IMPRESSION: Extremely limited examination due to body habitus. No definite hydronephrosis. Electronically Signed   By: Annia Belt M.D.   On: 09/29/2015 18:49    Scheduled Meds: . collagenase   Topical Daily  . DAPTOmycin (CUBICIN)  IV  700 mg Intravenous Q24H  . feeding supplement (GLUCERNA SHAKE)  237 mL Oral BID BM  . feeding supplement (PRO-STAT SUGAR FREE 64)  30 mL Oral BID  . ferrous sulfate  325 mg Oral Q breakfast  . heparin  5,000 Units Subcutaneous 3 times per day  . insulin aspart  0-5 Units Subcutaneous  QHS  . insulin aspart  0-9 Units Subcutaneous TID WC  . lactulose  20 g Oral BID  . linagliptin  5 mg Oral Daily   Continuous Infusions: . sodium chloride 10 mL/hr at 09/28/15 1201  . sodium chloride 75 mL/hr at 10/01/15 1052      Time spent: 15 minutes  Pleas Koch, MD [facility] [phone]

## 2015-10-01 NOTE — Progress Notes (Signed)
[facility]    Subjective: No new complaints Antibiotics:  Anti-infectives    Start     Dose/Rate Route Frequency Ordered Stop   09/26/15 1400  ceFAZolin (ANCEF) IVPB 2 g/50 mL premix  Status:  Discontinued     2 g 100 mL/hr over 30 Minutes Intravenous 3 times per day 09/26/15 1306 09/27/15 0851   09/25/15 1800  DAPTOmycin (CUBICIN) 700 mg in sodium chloride 0.9 % IVPB     700 mg 228 mL/hr over 30 Minutes Intravenous Every 24 hours 09/25/15 1650     09/23/15 1800  vancomycin (VANCOCIN) 1,750 mg in sodium chloride 0.9 % 500 mL IVPB  Status:  Discontinued     1,750 mg 250 mL/hr over 120 Minutes Intravenous Every 24 hours 09/23/15 1327 09/25/15 1644   09/22/15 1200  vancomycin (VANCOCIN) IVPB 1000 mg/200 mL premix  Status:  Discontinued     1,000 mg 200 mL/hr over 60 Minutes Intravenous Every 12 hours 09/22/15 0945 09/23/15 1130   09/22/15 0800  piperacillin-tazobactam (ZOSYN) IVPB 3.375 g  Status:  Discontinued     3.375 g 12.5 mL/hr over 240 Minutes Intravenous Every 8 hours 09/22/15 0159 09/25/15 1644   09/22/15 0000  vancomycin (VANCOCIN) IVPB 1000 mg/200 mL premix     1,000 mg 200 mL/hr over 60 Minutes Intravenous Every 1 hr x 2 09/21/15 2336 09/22/15 0159   09/21/15 2330  piperacillin-tazobactam (ZOSYN) IVPB 3.375 g  Status:  Discontinued     3.375 g 100 mL/hr over 30 Minutes Intravenous 3 times per day 09/21/15 2321 09/22/15 0157      Medications: Scheduled Meds: . collagenase   Topical Daily  . DAPTOmycin (CUBICIN)  IV  700 mg Intravenous Q24H  . feeding supplement (GLUCERNA SHAKE)  237 mL Oral BID BM  . feeding supplement (PRO-STAT SUGAR FREE 64)  30 mL Oral BID  . ferrous sulfate  325 mg Oral Q breakfast  . heparin  5,000 Units Subcutaneous 3 times per day  . insulin aspart  0-5 Units Subcutaneous QHS  . insulin aspart  0-9 Units Subcutaneous TID WC  . lactulose  20 g Oral BID  . linagliptin  5 mg Oral Daily    Continuous Infusions: . sodium chloride 10 mL/hr at 09/28/15 1201   PRN Meds:.acetaminophen **OR** acetaminophen, albuterol, HYDROmorphone (DILAUDID) injection, [DISCONTINUED] ondansetron **OR** ondansetron (ZOFRAN) IV, ondansetron **OR** ondansetron (ZOFRAN) IV    Objective: Weight change:   Intake/Output Summary (Last 24 hours) at 10/01/15 1841 Last data filed at 10/01/15 1753  Gross per 24 hour  Intake 3332.5 ml  Output    725 ml  Net 2607.5 ml   Blood pressure 130/96, pulse 99, temperature 98.4 F (36.9 C), temperature source Oral, resp. rate 18, height 5\' 6"  (1.676 m), weight 249 lb 1.9 oz (113 kg), SpO2 99 %. Temp:  [97.5 F (36.4 C)-98.4 F (36.9 C)] 98.4 F (36.9 C) (10/26 1736) Pulse Rate:  [92-99] 99 (10/26 1736) Resp:  [10-20] 18 (10/26 1736) BP: (120-130)/(54-96) 130/96 mmHg (10/26 1736) SpO2:  [93 %-99 %] 99 % (10/26 1736)  Physical Exam: General: alert, awake oriented engaged in conversation with me and with her husband HEENT: anicteric sclera, EOMI, oropharynx clear and without exudate Cardiovascular: egular rate, normal r, no murmur rubs or gallops Pulmonary: clear to auscultation bilaterally, no wheezing, rales or rhonchi Gastrointestinal: soft nontender, nondistended, normal bowel sounds, Musculoskeletal: Left above-the-knee sie is clean  with bandage over the surgical site Neuro: nonfocal, strength and sensation intact  CBC:  CBC Latest Ref Rng 10/01/2015 09/30/2015 09/29/2015  WBC 4.0 - 10.5 K/uL 23.0(H) 22.2(H) 22.9(H)  Hemoglobin 12.0 - 15.0 g/dL 4.0(J) 8.1(X) 9.1(Y)  Hematocrit 36.0 - 46.0 % 27.1(L) 28.2(L) 30.4(L)  Platelets 150 - 400 K/uL 364 374 435(H)      BMET  Recent Labs  09/30/15 0610 10/01/15 0454  NA 130* 131*  K 4.5 3.7  CL 105 105  CO2 17* 19*  GLUCOSE 199* 170*  BUN 30* 31*  CREATININE 1.87* 1.54*  CALCIUM 7.1* 7.0*     Liver Panel   Recent Labs  10/01/15 0454  PROT 3.9*  ALBUMIN 1.4*  AST 21  ALT 13*   ALKPHOS 81  BILITOT 0.5       Sedimentation Rate No results for input(s): ESRSEDRATE in the last 72 hours. C-Reactive Protein No results for input(s): CRP in the last 72 hours.  Micro Results: Recent Results (from the past 720 hour(s))  Culture, blood (routine x 2)     Status: None   Collection Time: 09/21/15  8:16 PM  Result Value Ref Range Status   Specimen Description BLOOD A-LINE DRAW DRAWN BY RN  Final   Special Requests BOTTLES DRAWN AEROBIC AND ANAEROBIC 6CC  Final   Culture  Setup Time   Final    GRAM POSITIVE COCCI IN CLUSTERS RECOVERED FROM THE ANAEROBIC BOTTLE Gram Stain Report Called to,Read Back By and Verified With: COVINGTON,L. AT 1414 ON 09/24/2015 BY BAUGHAM,M. Performed at [facility]    Culture   Final    METHICILLIN RESISTANT STAPHYLOCOCCUS AUREUS Performed at [facility]    Report Status 09/27/2015 FINAL  Final   Organism ID, Bacteria METHICILLIN RESISTANT STAPHYLOCOCCUS AUREUS  Final      Susceptibility   Methicillin resistant staphylococcus aureus - MIC*    CIPROFLOXACIN >=8 RESISTANT Resistant     ERYTHROMYCIN >=8 RESISTANT Resistant     GENTAMICIN <=0.5 SENSITIVE Sensitive     OXACILLIN >=4 RESISTANT Resistant     TETRACYCLINE <=1 SENSITIVE Sensitive     VANCOMYCIN 1 SENSITIVE Sensitive     TRIMETH/SULFA >=320 RESISTANT Resistant     CLINDAMYCIN <=0.25 SENSITIVE Sensitive     RIFAMPIN <=0.5 SENSITIVE Sensitive     Inducible Clindamycin NEGATIVE Sensitive     * METHICILLIN RESISTANT STAPHYLOCOCCUS AUREUS  Culture, blood (routine x 2)     Status: None   Collection Time: 09/21/15 11:30 PM  Result Value Ref Range Status   Specimen Description BLOOD RIGHT ANTECUBITAL  Final   Special Requests BOTTLES DRAWN AEROBIC AND ANAEROBIC 6CC  Final   Culture  Setup Time   Final    GRAM POSITIVE COCCI IN CLUSTERS RECOVERED FROM THE AEROBIC BOTTLE Gram Stain Report Called to,Read Back By and Verified With: DAVIS,L. AT 1822 ON 09/25/2015 BY  BAUGHAM,M. Performed at [facility]    Culture   Final    METHICILLIN RESISTANT STAPHYLOCOCCUS AUREUS Performed at [facility]    Report Status 10/01/2015 FINAL  Final   Organism ID, Bacteria METHICILLIN RESISTANT STAPHYLOCOCCUS AUREUS  Final      Susceptibility   Methicillin resistant staphylococcus aureus - MIC*    CIPROFLOXACIN >=8 RESISTANT Resistant     ERYTHROMYCIN >=8 RESISTANT Resistant     GENTAMICIN <=0.5 SENSITIVE Sensitive     OXACILLIN >=4 RESISTANT Resistant     TETRACYCLINE <=1 SENSITIVE Sensitive     VANCOMYCIN <=0.5  SENSITIVE Sensitive     TRIMETH/SULFA 160 RESISTANT Resistant     CLINDAMYCIN <=0.25 SENSITIVE Sensitive     RIFAMPIN <=0.5 SENSITIVE Sensitive     Inducible Clindamycin NEGATIVE Sensitive     * METHICILLIN RESISTANT STAPHYLOCOCCUS AUREUS  Urine culture     Status: None   Collection Time: 09/21/15 11:53 PM  Result Value Ref Range Status   Specimen Description URINE, CLEAN CATCH  Final   Special Requests NONE  Final   Culture   Final    MULTIPLE SPECIES PRESENT, SUGGEST RECOLLECTION Performed at [facility]    Report Status 09/23/2015 FINAL  Final  MRSA PCR Screening     Status: None   Collection Time: 09/22/15  1:52 PM  Result Value Ref Range Status   MRSA by PCR NEGATIVE NEGATIVE Final    Comment:        The GeneXpert MRSA Assay (FDA approved for NASAL specimens only), is one component of a comprehensive MRSA colonization surveillance program. It is not intended to diagnose MRSA infection nor to guide or monitor treatment for MRSA infections.   Culture, blood (routine x 2)     Status: None   Collection Time: 09/26/15  1:53 PM  Result Value Ref Range Status   Specimen Description BLOOD PICC LINE DRAWN BY RN  Final   Special Requests BOTTLES DRAWN AEROBIC AND ANAEROBIC 6CC EACH  Final   Culture NO GROWTH 5 DAYS  Final   Report Status 10/01/2015 FINAL  Final  Culture, blood (routine x 2)     Status: None    Collection Time: 09/26/15  1:54 PM  Result Value Ref Range Status   Specimen Description BLOOD LEFT ANTECUBITAL  Final   Special Requests BOTTLES DRAWN AEROBIC AND ANAEROBIC 6CC EACH  Final   Culture NO GROWTH 5 DAYS  Final   Report Status 10/01/2015 FINAL  Final  Culture, blood (routine x 2)     Status: None (Preliminary result)   Collection Time: 09/27/15  6:18 PM  Result Value Ref Range Status   Specimen Description BLOOD RIGHT ANTECUBITAL  Final   Special Requests BOTTLES DRAWN AEROBIC ONLY 9CC  Final   Culture NO GROWTH 4 DAYS  Final   Report Status PENDING  Incomplete  Culture, blood (routine x 2)     Status: None   Collection Time: 09/27/15  6:25 PM  Result Value Ref Range Status   Specimen Description BLOOD LEFT ANTECUBITAL  Final   Special Requests IN PEDIATRIC BOTTLE 3CC  Final   Culture  Setup Time   Final    GRAM POSITIVE COCCI IN CLUSTERS IN PEDIATRIC BOTTLE CRITICAL RESULT CALLED TO, READ BACK BY AND VERIFIED WITH: Bonnee Quin [id number] WILDERK    Culture   Final    METHICILLIN RESISTANT STAPHYLOCOCCUS AUREUS SUSCEPTIBILITIES PERFORMED ON PREVIOUS CULTURE WITHIN THE LAST 5 DAYS.    Report Status 09/30/2015 FINAL  Final  Surgical pcr screen     Status: None   Collection Time: 09/27/15 11:53 PM  Result Value Ref Range Status   MRSA, PCR NEGATIVE NEGATIVE Final   Staphylococcus aureus NEGATIVE NEGATIVE Final    Comment:        The Xpert SA Assay (FDA approved for NASAL specimens in patients over 64 years of age), is one component of a comprehensive surveillance program.  Test performance has been validated by [facility] for patients greater than or equal to 58 year old. It is not intended to diagnose infection nor  to guide or monitor treatment.   Culture, blood (routine x 2)     Status: None (Preliminary result)   Collection Time: 09/29/15  3:21 PM  Result Value Ref Range Status   Specimen Description BLOOD RIGHT HAND  Final   Special Requests BOTTLES  DRAWN AEROBIC ONLY 5CC  Final   Culture NO GROWTH 2 DAYS  Final   Report Status PENDING  Incomplete  Culture, blood (routine x 2)     Status: None (Preliminary result)   Collection Time: 09/29/15  4:20 PM  Result Value Ref Range Status   Specimen Description BLOOD RIGHT HAND  Final   Special Requests IN PEDIATRIC BOTTLE 3CC  Final   Culture  Setup Time   Final    GRAM POSITIVE COCCI IN CLUSTERS AEROBIC BOTTLE ONLY CRITICAL RESULT CALLED TO, READ BACK BY AND VERIFIED WITH: EMMANUAL  10/01/15 MKELLY    Culture NO GROWTH 2 DAYS  Final   Report Status PENDING  Incomplete  Urine culture     Status: None   Collection Time: 09/29/15  4:46 PM  Result Value Ref Range Status   Specimen Description URINE, CATHETERIZED  Final   Special Requests NONE  Final   Culture MULTIPLE SPECIES PRESENT, SUGGEST RECOLLECTION  Final   Report Status 10/01/2015 FINAL  Final  Culture, blood (routine x 2)     Status: None (Preliminary result)   Collection Time: 10/01/15  4:25 PM  Result Value Ref Range Status   Specimen Description BLOOD RIGHT ARM  Final   Special Requests BOTTLES DRAWN AEROBIC ONLY 5CC  Final   Culture PENDING  Incomplete   Report Status PENDING  Incomplete    Studies/Results: No results found.    Assessment/Plan:  INTERVAL HISTORY:  09/28/2015: Patient is status post above-the-knee imitation left 09/29/15: 10//22/16 CULTURES STILL POSITIVE MRSA 10/01/15: 09/29/15 cultures [facility] in 1/2 bottles  Principal Problem:   HCAP (healthcare-associated pneumonia) Active Problems:   Type II diabetes mellitus (HCC)   Essential hypertension, benign   Obesity   Microcytic anemia   Wound dehiscence, surgical   Hypokalemia   Decubitus ulcer of left buttock, unstageable (HCC)   Absolute anemia   Staphylococcus aureus bacteremia   Acute renal failure (HCC)   Atelectasis   Bacterial infection of knee joint (HCC)   Staphylococcus aureus bacteremia with sepsis (HCC)   MRSA bacteremia    Chronic osteomyelitis of right femur (HCC)   Subacute osteomyelitis of right tibia (HCC)   Acute kidney injury (HCC)   Hyperkalemia   Idiopathic hypotension   Acute encephalopathy    Lindsay Cobb is a 74 y.o. 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 Specific aspects of clinical management are critical to optimizing the outcome of patients with SAB. Therefore, the [facility] Antimicrobial Management Team [facility]) has initiated an intervention aimed at improving the management of SAB at [facility]. To do so, Infectious Diseases physicians are providing an evidence-based consult for the management of all patients with SAB.     Yes  No Comments  Perform follow-up blood cultures (even if the patient is afebrile) to ensure clearance of bacteremia [X]  [ ]  And repeated cultures after PICC removal10/22/16 but STILL POSITIVE, from 1/2 10/22 and on 09/29/15 as well  Remove vascular catheter and obtain follow-up blood cultures after the removal of the catheter [X]  [ ]  DO NOT PLACE PICC FOR UNTIL CULTURS DRAWN TODAY STILL NEGATIVE in 3-4 days from today  Perform echocardiography to evaluate for endocarditis (transthoracic ECHO is 40-50% sensitive, TEE is > 90% sensitive) [ ]  [ ]  Cardiology to perform TEE which in light of persistently + cultures AFTER PICC removal and AKA is warranted  Consult electrophysiologist to evaluate implanted cardiac device  (pacemaker, ICD) [ ]  [ ]  NA   Ensure source control [ ]  [ ]  Have all abscesses been drained effectively? Have deep seeded infections (septic joints or osteomyelitis) had appropriate surgical debridement?  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  Keep in mind that for deep seeded infections MRI imaging with contrast is preferred rather than other often insensitive tests such as plain x-rays, especially early in a patient's presentation. Not clear any other sites at this time  She had SOME back pain but it is not really prominent at present   Change antibiotic therapy to daptomycin  [ ]  [ ]  Beta-lactam antibiotics are preferred for MSSA due to higher cure rates.  If on Vancomycin, goal trough should be 15 - 20 mcg/mL  Estimated duration of IV antibiotic therapy:  8 weeks,  [ ]  [ ]  Consult case management for probably prolonged outpatient IV antibiotic therapy   Screening:HIV negative, HCV pending        LOS: 9 days   Lindsay LavCornelius Van Cobb 10/01/2015, 6:41 PM

## 2015-10-01 NOTE — Progress Notes (Signed)
    [facility] has been requested to perform a transesophageal echocardiogram on Lindsay Cobb  for bacteremia.  After careful review of history and examination, the risks and benefits of transesophageal echocardiogram have been explained including risks of esophageal damage, perforation (1:10,000 risk), bleeding, pharyngeal hematoma as well as other potential complications associated with conscious sedation including aspiration, arrhythmia, respiratory failure and death. Alternatives to treatment were discussed, questions were answered. Patient is willing to proceed.   The above information was discussed with the patient, her husband, and her sister. They are all in agreement to proceed.  BP has been 120/54 - 129/61 in the past 24 hours with no requirement for pressors. Hgb at 8.7 (been stable since AKA on 09/28/2015). Platelets at 364.   Ellsworth LennoxBrittany M Shenay Torti, PA-C 10/01/2015 4:17 PM

## 2015-10-01 NOTE — Progress Notes (Signed)
S: Eating better O:BP 120/61 mmHg  Pulse 93  Temp(Src) 98 F (36.7 C) (Oral)  Resp 10  Ht 5\' 6"  (1.676 m)  Wt 113 kg (249 lb 1.9 oz)  BMI 40.23 kg/m2  SpO2 98%  Intake/Output Summary (Last 24 hours) at 10/01/15 0925 Last data filed at 10/01/15 0814  Gross per 24 hour  Intake 1715.25 ml  Output    425 ml  Net 1290.25 ml   Weight change:  WUJ:WJXBJGen:awake and alert CVS:RRR Resp:decreased BS bases Abd:+ BS NTND Ext: Lt AKA, tr edema on RLE NEURO: CNI Ox3 no asterixis   . collagenase   Topical Daily  . DAPTOmycin (CUBICIN)  IV  700 mg Intravenous Q24H  . feeding supplement (GLUCERNA SHAKE)  237 mL Oral BID BM  . feeding supplement (PRO-STAT SUGAR FREE 64)  30 mL Oral BID  . ferrous sulfate  325 mg Oral Q breakfast  . heparin  5,000 Units Subcutaneous 3 times per day  . insulin aspart  0-5 Units Subcutaneous QHS  . insulin aspart  0-9 Units Subcutaneous TID WC  . lactulose  20 g Oral BID  . linagliptin  5 mg Oral Daily   [place] Renal  09/29/2015  CLINICAL DATA:  Acute renal injury. EXAM: RENAL / URINARY TRACT ULTRASOUND COMPLETE COMPARISON:  None. FINDINGS: Extremely limited exam due to body habitus. Right Kidney: Length: 9.8 cm.  No definite hydronephrosis. Left Kidney: Length: 9.5 cm.  No definite hydronephrosis. Bladder: Not visualized. IMPRESSION: Extremely limited examination due to body habitus. No definite hydronephrosis. Electronically Signed   By: Annia Beltrew  Davis M.D.   On: 09/29/2015 18:49   BMET    Component Value Date/Time   NA 131* 10/01/2015 0454   K 3.7 10/01/2015 0454   CL 105 10/01/2015 0454   CO2 19* 10/01/2015 0454   GLUCOSE 170* 10/01/2015 0454   BUN 31* 10/01/2015 0454   CREATININE 1.54* 10/01/2015 0454   CREATININE 0.91 04/21/2015 1442   CALCIUM 7.0* 10/01/2015 0454   GFRNONAA 32* 10/01/2015 0454   GFRNONAA 72 09/20/2014 1435   GFRAA 37* 10/01/2015 0454   GFRAA 83 09/20/2014 1435   CBC    Component Value Date/Time   WBC 23.0* 10/01/2015 0454   RBC  3.46* 10/01/2015 0454   HGB 8.7* 10/01/2015 0454   HCT 27.1* 10/01/2015 0454   PLT 364 10/01/2015 0454   MCV 78.3 10/01/2015 0454   MCH 25.1* 10/01/2015 0454   MCHC 32.1 10/01/2015 0454   RDW 17.9* 10/01/2015 0454   LYMPHSABS 2.0 10/01/2015 0454   MONOABS 1.8* 10/01/2015 0454   EOSABS 5.4* 10/01/2015 0454   BASOSABS 0.1 10/01/2015 0454     Assessment: 1. ARF sec MRSA infection and hypotension, renal fx improving.  UO not recorded yest despite foley cath 2. MRSA bacteremia 3. Lt knee infection SP AKA 4. DM  Plan: 1. Anticipate renal fx to cont to improve to baseline 2. She will need rehab 3.  Will sign off.  Call if further renal issues  Kasiyah Platter T

## 2015-10-01 NOTE — Progress Notes (Signed)
GRAM POSITIVE COCCI IN CLUSTERS  AEROBIC BOTTLE ONLY         NP on call paged.

## 2015-10-02 ENCOUNTER — Encounter (HOSPITAL_COMMUNITY): Payer: Self-pay

## 2015-10-02 ENCOUNTER — Encounter (HOSPITAL_COMMUNITY)
Admission: EM | Disposition: A | Discharge status: Skilled Nursing Facility | Payer: Self-pay | Source: Home / Self Care | Attending: Family Medicine

## 2015-10-02 ENCOUNTER — Inpatient Hospital Stay (HOSPITAL_COMMUNITY): Payer: Commercial Managed Care - HMO

## 2015-10-02 DIAGNOSIS — R7881 Bacteremia: Secondary | ICD-10-CM | POA: Insufficient documentation

## 2015-10-02 DIAGNOSIS — J189 Pneumonia, unspecified organism: Principal | ICD-10-CM

## 2015-10-02 HISTORY — PX: TEE WITHOUT CARDIOVERSION: SHX5443

## 2015-10-02 LAB — RENAL FUNCTION PANEL
ANION GAP: 9 (ref 5–15)
Albumin: 1.4 g/dL — ABNORMAL LOW (ref 3.5–5.0)
BUN: 29 mg/dL — AB (ref 6–20)
CHLORIDE: 108 mmol/L (ref 101–111)
CO2: 16 mmol/L — AB (ref 22–32)
Calcium: 7.2 mg/dL — ABNORMAL LOW (ref 8.9–10.3)
Creatinine, Ser: 1.24 mg/dL — ABNORMAL HIGH (ref 0.44–1.00)
GFR calc Af Amer: 49 mL/min — ABNORMAL LOW (ref >60)
GFR calc non Af Amer: 42 mL/min — ABNORMAL LOW (ref >60)
GLUCOSE: 153 mg/dL — AB (ref 65–99)
POTASSIUM: 4.4 mmol/L (ref 3.5–5.1)
Phosphorus: 2.4 mg/dL — ABNORMAL LOW (ref 2.5–4.6)
Sodium: 133 mmol/L — ABNORMAL LOW (ref 135–145)

## 2015-10-02 LAB — GLUCOSE, CAPILLARY
GLUCOSE-CAPILLARY: 138 mg/dL — AB (ref 65–99)
Glucose-Capillary: 149 mg/dL — ABNORMAL HIGH (ref 65–99)
Glucose-Capillary: 149 mg/dL — ABNORMAL HIGH (ref 65–99)
Glucose-Capillary: 146 mg/dL — ABNORMAL HIGH (ref 65–99)

## 2015-10-02 LAB — CULTURE, BLOOD (ROUTINE X 2): Culture: NO GROWTH

## 2015-10-02 SURGERY — ECHOCARDIOGRAM, TRANSESOPHAGEAL
Anesthesia: Moderate Sedation

## 2015-10-02 MED ORDER — MIDAZOLAM HCL 10 MG/2ML IJ SOLN
INTRAMUSCULAR | Status: DC | PRN
  Administered 2015-10-02: 1 mg via INTRAVENOUS
  Administered 2015-10-02: 2 mg via INTRAVENOUS
  Administered 2015-10-02: 1 mg via INTRAVENOUS

## 2015-10-02 MED ORDER — FENTANYL CITRATE (PF) 100 MCG/2ML IJ SOLN
INTRAMUSCULAR | Status: DC | PRN
  Administered 2015-10-02 (×2): 25 ug via INTRAVENOUS

## 2015-10-02 MED ORDER — SODIUM CHLORIDE 0.9 % IV SOLN
900.0000 mg | INTRAVENOUS | Status: DC
  Administered 2015-10-03 – 2015-10-08 (×6): 900 mg via INTRAVENOUS
  Filled 2015-10-02 (×9): qty 18

## 2015-10-02 MED ORDER — BUTAMBEN-TETRACAINE-BENZOCAINE 2-2-14 % EX AERO
INHALATION_SPRAY | CUTANEOUS | Status: DC | PRN
  Administered 2015-10-02: 2 via TOPICAL

## 2015-10-02 MED ORDER — MIDAZOLAM HCL 5 MG/ML IJ SOLN
INTRAMUSCULAR | Status: AC
  Filled 2015-10-02: qty 2

## 2015-10-02 MED ORDER — FENTANYL CITRATE (PF) 100 MCG/2ML IJ SOLN
INTRAMUSCULAR | Status: AC
  Filled 2015-10-02: qty 2

## 2015-10-02 NOTE — Progress Notes (Signed)
[facility]    Subjective: No new complaints Antibiotics:  Anti-infectives    Start     Dose/Rate Route Frequency Ordered Stop   09/26/15 1400  ceFAZolin (ANCEF) IVPB 2 g/50 mL premix  Status:  Discontinued     2 g 100 mL/hr over 30 Minutes Intravenous 3 times per day 09/26/15 1306 09/27/15 0851   09/25/15 1800  DAPTOmycin (CUBICIN) 700 mg in sodium chloride 0.9 % IVPB     700 mg 228 mL/hr over 30 Minutes Intravenous Every 24 hours 09/25/15 1650     09/23/15 1800  vancomycin (VANCOCIN) 1,750 mg in sodium chloride 0.9 % 500 mL IVPB  Status:  Discontinued     1,750 mg 250 mL/hr over 120 Minutes Intravenous Every 24 hours 09/23/15 1327 09/25/15 1644   09/22/15 1200  vancomycin (VANCOCIN) IVPB 1000 mg/200 mL premix  Status:  Discontinued     1,000 mg 200 mL/hr over 60 Minutes Intravenous Every 12 hours 09/22/15 0945 09/23/15 1130   09/22/15 0800  piperacillin-tazobactam (ZOSYN) IVPB 3.375 g  Status:  Discontinued     3.375 g 12.5 mL/hr over 240 Minutes Intravenous Every 8 hours 09/22/15 0159 09/25/15 1644   09/22/15 0000  vancomycin (VANCOCIN) IVPB 1000 mg/200 mL premix     1,000 mg 200 mL/hr over 60 Minutes Intravenous Every 1 hr x 2 09/21/15 2336 09/22/15 0159   09/21/15 2330  piperacillin-tazobactam (ZOSYN) IVPB 3.375 g  Status:  Discontinued     3.375 g 100 mL/hr over 30 Minutes Intravenous 3 times per day 09/21/15 2321 09/22/15 0157      Medications: Scheduled Meds: . collagenase   Topical Daily  . DAPTOmycin (CUBICIN)  IV  700 mg Intravenous Q24H  . feeding supplement (GLUCERNA SHAKE)  237 mL Oral BID BM  . feeding supplement (PRO-STAT SUGAR FREE 64)  30 mL Oral BID  . ferrous sulfate  325 mg Oral Q breakfast  . heparin  5,000 Units Subcutaneous 3 times per day  . insulin aspart  0-5 Units Subcutaneous QHS  . insulin aspart  0-9 Units Subcutaneous TID WC  . lactulose  20 g Oral BID  . linagliptin  5 mg Oral Daily    Continuous Infusions: . sodium chloride 10 mL/hr at 09/28/15 1201   PRN Meds:.acetaminophen **OR** acetaminophen, albuterol, HYDROmorphone (DILAUDID) injection, [DISCONTINUED] ondansetron **OR** ondansetron (ZOFRAN) IV, ondansetron **OR** ondansetron (ZOFRAN) IV    Objective: Weight change:   Intake/Output Summary (Last 24 hours) at 10/02/15 1040 Last data filed at 10/02/15 0600  Gross per 24 hour  Intake 1931.25 ml  Output    650 ml  Net 1281.25 ml   Blood pressure 139/66, pulse 97, temperature 98.5 F (36.9 C), temperature source Oral, resp. rate 18, height 5\' 6"  (1.676 m), weight 249 lb 1.9 oz (113 kg), SpO2 98 %. Temp:  [98.1 F (36.7 C)-98.5 F (36.9 C)] 98.5 F (36.9 C) (10/27 0801) Pulse Rate:  [97-99] 97 (10/27 0801) Resp:  [18-19] 18 (10/27 0801) BP: (130-152)/(60-96) 139/66 mmHg (10/27 0801) SpO2:  [96 %-100 %] 98 % (10/27 0801)  Physical Exam: General: alert, awake oriented engaged in conversation with me  HEENT: anicteric sclera, EOMI, oropharynx clear and without exudate Cardiovascular: egular rate, normal r, no murmur rubs or gallops Pulmonary: clear to auscultation bilaterally, no wheezing, rales or rhonchi Gastrointestinal: soft nontender, nondistended, normal bowel sounds, Musculoskeletal: Left above-the-knee sie is clean with bandage over  the surgical site Neuro: nonfocal, strength and sensation intact  CBC:  CBC Latest Ref Rng 10/01/2015 09/30/2015 09/29/2015  WBC 4.0 - 10.5 K/uL 23.0(H) 22.2(H) 22.9(H)  Hemoglobin 12.0 - 15.0 g/dL 1.6(X) 0.9(U) 0.4(V)  Hematocrit 36.0 - 46.0 % 27.1(L) 28.2(L) 30.4(L)  Platelets 150 - 400 K/uL 364 374 435(H)      BMET  Recent Labs  10/01/15 0454 10/02/15 0416  NA 131* 133*  K 3.7 4.4  CL 105 108  CO2 19* 16*  GLUCOSE 170* 153*  BUN 31* 29*  CREATININE 1.54* 1.24*  CALCIUM 7.0* 7.2*     Liver Panel   Recent Labs  10/01/15 0454 10/02/15 0416  PROT 3.9*  --   ALBUMIN 1.4* 1.4*  AST 21  --    ALT 13*  --   ALKPHOS 81  --   BILITOT 0.5  --        Sedimentation Rate No results for input(s): ESRSEDRATE in the last 72 hours. C-Reactive Protein No results for input(s): CRP in the last 72 hours.  Micro Results: Recent Results (from the past 720 hour(s))  Culture, blood (routine x 2)     Status: None   Collection Time: 09/21/15  8:16 PM  Result Value Ref Range Status   Specimen Description BLOOD A-LINE DRAW DRAWN BY RN  Final   Special Requests BOTTLES DRAWN AEROBIC AND ANAEROBIC 6CC  Final   Culture  Setup Time   Final    GRAM POSITIVE COCCI IN CLUSTERS RECOVERED FROM THE ANAEROBIC BOTTLE Gram Stain Report Called to,Read Back By and Verified With: COVINGTON,L. AT 1414 ON 09/24/2015 BY BAUGHAM,M. Performed at [facility]    Culture   Final    METHICILLIN RESISTANT STAPHYLOCOCCUS AUREUS Performed at [facility]    Report Status 09/27/2015 FINAL  Final   Organism ID, Bacteria METHICILLIN RESISTANT STAPHYLOCOCCUS AUREUS  Final      Susceptibility   Methicillin resistant staphylococcus aureus - MIC*    CIPROFLOXACIN >=8 RESISTANT Resistant     ERYTHROMYCIN >=8 RESISTANT Resistant     GENTAMICIN <=0.5 SENSITIVE Sensitive     OXACILLIN >=4 RESISTANT Resistant     TETRACYCLINE <=1 SENSITIVE Sensitive     VANCOMYCIN 1 SENSITIVE Sensitive     TRIMETH/SULFA >=320 RESISTANT Resistant     CLINDAMYCIN <=0.25 SENSITIVE Sensitive     RIFAMPIN <=0.5 SENSITIVE Sensitive     Inducible Clindamycin NEGATIVE Sensitive     * METHICILLIN RESISTANT STAPHYLOCOCCUS AUREUS  Culture, blood (routine x 2)     Status: None   Collection Time: 09/21/15 11:30 PM  Result Value Ref Range Status   Specimen Description BLOOD RIGHT ANTECUBITAL  Final   Special Requests BOTTLES DRAWN AEROBIC AND ANAEROBIC 6CC  Final   Culture  Setup Time   Final    GRAM POSITIVE COCCI IN CLUSTERS RECOVERED FROM THE AEROBIC BOTTLE Gram Stain Report Called to,Read Back By and Verified With: DAVIS,L.  AT 1822 ON 09/25/2015 BY BAUGHAM,M. Performed at [facility]    Culture   Final    METHICILLIN RESISTANT STAPHYLOCOCCUS AUREUS Performed at [facility]    Report Status 10/01/2015 FINAL  Final   Organism ID, Bacteria METHICILLIN RESISTANT STAPHYLOCOCCUS AUREUS  Final      Susceptibility   Methicillin resistant staphylococcus aureus - MIC*    CIPROFLOXACIN >=8 RESISTANT Resistant     ERYTHROMYCIN >=8 RESISTANT Resistant     GENTAMICIN <=0.5 SENSITIVE Sensitive     OXACILLIN >=4 RESISTANT  Resistant     TETRACYCLINE <=1 SENSITIVE Sensitive     VANCOMYCIN <=0.5 SENSITIVE Sensitive     TRIMETH/SULFA 160 RESISTANT Resistant     CLINDAMYCIN <=0.25 SENSITIVE Sensitive     RIFAMPIN <=0.5 SENSITIVE Sensitive     Inducible Clindamycin NEGATIVE Sensitive     * METHICILLIN RESISTANT STAPHYLOCOCCUS AUREUS  Urine culture     Status: None   Collection Time: 09/21/15 11:53 PM  Result Value Ref Range Status   Specimen Description URINE, CLEAN CATCH  Final   Special Requests NONE  Final   Culture   Final    MULTIPLE SPECIES PRESENT, SUGGEST RECOLLECTION Performed at [facility]    Report Status 09/23/2015 FINAL  Final  MRSA PCR Screening     Status: None   Collection Time: 09/22/15  1:52 PM  Result Value Ref Range Status   MRSA by PCR NEGATIVE NEGATIVE Final    Comment:        The GeneXpert MRSA Assay (FDA approved for NASAL specimens only), is one component of a comprehensive MRSA colonization surveillance program. It is not intended to diagnose MRSA infection nor to guide or monitor treatment for MRSA infections.   Culture, blood (routine x 2)     Status: None   Collection Time: 09/26/15  1:53 PM  Result Value Ref Range Status   Specimen Description BLOOD PICC LINE DRAWN BY RN  Final   Special Requests BOTTLES DRAWN AEROBIC AND ANAEROBIC 6CC EACH  Final   Culture NO GROWTH 5 DAYS  Final   Report Status 10/01/2015 FINAL  Final  Culture, blood (routine x 2)      Status: None   Collection Time: 09/26/15  1:54 PM  Result Value Ref Range Status   Specimen Description BLOOD LEFT ANTECUBITAL  Final   Special Requests BOTTLES DRAWN AEROBIC AND ANAEROBIC 6CC EACH  Final   Culture NO GROWTH 5 DAYS  Final   Report Status 10/01/2015 FINAL  Final  Culture, blood (routine x 2)     Status: None (Preliminary result)   Collection Time: 09/27/15  6:18 PM  Result Value Ref Range Status   Specimen Description BLOOD RIGHT ANTECUBITAL  Final   Special Requests BOTTLES DRAWN AEROBIC ONLY 9CC  Final   Culture NO GROWTH 4 DAYS  Final   Report Status PENDING  Incomplete  Culture, blood (routine x 2)     Status: None   Collection Time: 09/27/15  6:25 PM  Result Value Ref Range Status   Specimen Description BLOOD LEFT ANTECUBITAL  Final   Special Requests IN PEDIATRIC BOTTLE 3CC  Final   Culture  Setup Time   Final    GRAM POSITIVE COCCI IN CLUSTERS IN PEDIATRIC BOTTLE CRITICAL RESULT CALLED TO, READ BACK BY AND VERIFIED WITH: Bonnee Quin [id number] WILDERK    Culture   Final    METHICILLIN RESISTANT STAPHYLOCOCCUS AUREUS SUSCEPTIBILITIES PERFORMED ON PREVIOUS CULTURE WITHIN THE LAST 5 DAYS.    Report Status 09/30/2015 FINAL  Final  Surgical pcr screen     Status: None   Collection Time: 09/27/15 11:53 PM  Result Value Ref Range Status   MRSA, PCR NEGATIVE NEGATIVE Final   Staphylococcus aureus NEGATIVE NEGATIVE Final    Comment:        The Xpert SA Assay (FDA approved for NASAL specimens in patients over 36 years of age), is one component of a comprehensive surveillance program.  Test performance has been validated by [facility] for patients greater  than or equal to 53 year old. It is not intended to diagnose infection nor to guide or monitor treatment.   Culture, blood (routine x 2)     Status: None (Preliminary result)   Collection Time: 09/29/15  3:21 PM  Result Value Ref Range Status   Specimen Description BLOOD RIGHT HAND  Final    Special Requests BOTTLES DRAWN AEROBIC ONLY 5CC  Final   Culture NO GROWTH 2 DAYS  Final   Report Status PENDING  Incomplete  Culture, blood (routine x 2)     Status: None (Preliminary result)   Collection Time: 09/29/15  4:20 PM  Result Value Ref Range Status   Specimen Description BLOOD RIGHT HAND  Final   Special Requests IN PEDIATRIC BOTTLE 3CC  Final   Culture  Setup Time   Final    GRAM POSITIVE COCCI IN CLUSTERS AEROBIC BOTTLE ONLY CRITICAL RESULT CALLED TO, READ BACK BY AND VERIFIED WITH: EMMANUAL @0502  10/01/15 MKELLY    Culture NO GROWTH 2 DAYS  Final   Report Status PENDING  Incomplete  Urine culture     Status: None   Collection Time: 09/29/15  4:46 PM  Result Value Ref Range Status   Specimen Description URINE, CATHETERIZED  Final   Special Requests NONE  Final   Culture MULTIPLE SPECIES PRESENT, SUGGEST RECOLLECTION  Final   Report Status 10/01/2015 FINAL  Final  Culture, blood (routine x 2)     Status: None (Preliminary result)   Collection Time: 10/01/15  4:25 PM  Result Value Ref Range Status   Specimen Description BLOOD RIGHT ARM  Final   Special Requests BOTTLES DRAWN AEROBIC ONLY 5CC  Final   Culture PENDING  Incomplete   Report Status PENDING  Incomplete    Studies/Results: No results found.    Assessment/Plan:  INTERVAL HISTORY:  09/28/2015: Patient is status post above-the-knee imitation left 09/29/15: 10//22/16 CULTURES STILL POSITIVE MRSA 10/01/15: 09/29/15 cultures [facility] in 1/2 bottles  Principal Problem:   HCAP (healthcare-associated pneumonia) Active Problems:   Type II diabetes mellitus (HCC)   Essential hypertension, benign   Obesity   Microcytic anemia   Wound dehiscence, surgical   Hypokalemia   Decubitus ulcer of left buttock, unstageable (HCC)   Absolute anemia   Staphylococcus aureus bacteremia   Acute renal failure (HCC)   Atelectasis   Bacterial infection of knee joint (HCC)   Staphylococcus aureus bacteremia with sepsis  (HCC)   MRSA bacteremia   Chronic osteomyelitis of right femur (HCC)   Subacute osteomyelitis of right tibia (HCC)   Acute kidney injury (HCC)   Hyperkalemia   Idiopathic hypotension   Acute encephalopathy    Lindsay Cobb is a 74 y.o. 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 Specific aspects of clinical management are critical to optimizing the outcome of patients with SAB. Therefore, the [facility] Antimicrobial Management Team [facility]) has initiated an intervention aimed at improving the management of SAB at [facility]. To do so, Infectious Diseases physicians are providing an  evidence-based consult for the management of all patients with SAB.     Yes No Comments  Perform follow-up blood cultures (even if the patient is afebrile) to ensure clearance of bacteremia [X]  [ ]  And repeated cultures after PICC removal10/22/16 but STILL POSITIVE, from 1/2 10/22 and on 09/29/15 as well?   Remove vascular catheter and obtain follow-up blood cultures after the removal of the catheter [X]  [ ]  DO NOT PLACE PICC FOR UNTIL CULTURS DRAWN TODAY STILL NEGATIVE in 3-4 days from yesterday  Perform echocardiography to evaluate for endocarditis (transthoracic ECHO is 40-50% sensitive, TEE is > 90% sensitive) [ ]  [ ]  Cardiology to perform TEE which in light of persistently + cultures AFTER PICC removal and AKA is warranted  Consult electrophysiologist to  evaluate implanted cardiac device (pacemaker, ICD) [ ]  [ ]  NA   Ensure source control [ ]  [ ]  Have all abscesses been drained effectively? Have deep seeded infections (septic joints or osteomyelitis) had appropriate surgical debridement?  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  Keep in mind that for deep seeded infections MRI imaging with contrast is preferred rather than other often insensitive tests such as plain x-rays, especially early in a patient's presentation. Not clear any other sites at this time  She had SOME back pain but it is not really prominent at present   Change antibiotic therapy to daptomycin  [ ]  [ ]  Beta-lactam antibiotics are preferred for MSSA due to higher cure rates.  If on Vancomycin, goal trough should be 15 - 20 mcg/mL  Estimated duration of IV antibiotic therapy:  8 weeks,  [ ]  [ ]  Consult case management for probably prolonged outpatient IV antibiotic therapy   Screening:HIV negative,         LOS: 10 days   Lindsay Cobb 10/02/2015, 10:40 AM

## 2015-10-02 NOTE — Patient Outreach (Signed)
[facility]) Care Management  10/02/2015  Lindsay SkillernHelen M Cobb 01/05/1941 [id number]   Referral from Charlesetta ShanksVictoria Brewer, RN to assign SW, assigned Rohm and HaasScott Forrest, LCSW.  Thanks, Corrie MckusickLisa O. Sharlee BlewMoore, AABA [facility] CM Assistant Phone: [phone] Fax: [phone]

## 2015-10-02 NOTE — CV Procedure (Signed)
Brief TEE Note  LVEF >55%  Trivial MR, mild TR  No thrombus or vegetation noted.  For further details see full report.  Britanni Yarde C. Duke Salviaandolph, MD 10/02/2015 3:34 PM

## 2015-10-02 NOTE — Interval H&P Note (Signed)
History and Physical Interval Note:  10/02/2015 3:10 PM  Lindsay SkillernHelen M Mirza  has presented today for surgery, with the diagnosis of possible vegetation  The various methods of treatment have been discussed with the patient and family. After consideration of risks, benefits and other options for treatment, the patient has consented to  Procedure(s): TRANSESOPHAGEAL ECHOCARDIOGRAM (TEE) (N/A) as a surgical intervention .  The patient's history has been reviewed, patient examined, no change in status, stable for surgery.  I have reviewed the patient's chart and labs.  Questions were answered to the patient's satisfaction.     Madilyn Hookandolph, Amair Shrout P, MD 10/02/2015 3:10 PM

## 2015-10-02 NOTE — Consult Note (Signed)
   [facility] CM Inpatient Consult   10/02/2015  Lindsay Cobb 05/05/1941 [id number] Patient evaluated for community based chronic disease management services with [facility] Care Management Program as a benefit of patient's [employer] Medicare Insurance. Met with patient, husband, and her sister at bedside to explain [facility] Care Management services.  Patient and husband states that they plan to return to [facility] for her rehab and to return home with family.  Patient has had a recent Left AKA.  Consent form signed.  Patient will receive post discharge follow up and will be evaluated for monthly home visits for assessments and disease process education.  Left contact information and [facility] literature at bedside. Made Inpatient Case Manager, Cheryl and inpatient social worker, Vanessa,  aware that [facility] Care Management following. Of note, [facility] Care Management services does not replace or interfere with any services that are arranged by inpatient case management or social work.  For additional questions or referrals please contact:    , RN BSN CCM [facility] Liaison  [phone] business mobile phone    

## 2015-10-02 NOTE — Progress Notes (Addendum)
Pharmacy Antibiotic Time-Out Note  Lindsay Cobb is a 74 y.o. year-old female admitted on 09/21/2015.  The patient is currently on daptomycin for MRSA bacteremia.  Assessment/Plan: Lindsay Cobb was on Vancomycin and Zosyn - Zosyn was stopped given culture results.  Vancomycin was changed to daptomycin due to rising creatinine which is now improving.  She has a baseline CK of 21 - the next check is planned for 10/29.  Also, pravastatin was stopped to avoid overlapping toxicities.  WBC remains elevated and blood cx remain positive.  ID recommending checking TEE to r/o endocarditis.  Dosing was discussed with Dr. Daiva EvesVan Dam - due to persistent positive cultures, will go ahead and adjust the Daptomycin dose to 8 mg/kg (~900 mg) every 24 hours. The dose for today has already been made - so will plan to start the new dose on 10/28. Will need PICC placed once blood cultures have been clear for 72 hours.    Recent Labs Lab 09/27/15 2115 09/28/15 0431 09/29/15 0503 09/30/15 0610 10/01/15 0454  WBC 22.1* 20.9* 22.9* 22.2* 23.0*     Recent Labs Lab 09/28/15 0309 09/29/15 0503 09/30/15 0610 10/01/15 0454 10/02/15 0416  CREATININE 1.82* 2.27* 1.87* 1.54* 1.24*   Estimated Creatinine Clearance: 51.5 mL/min (by C-G formula based on Cr of 1.24). Tmax/24h 98.2  Antimicrobial allergies: none  Antimicrobials: Zosyn  10/16 >> 10/20 Vancomycin  10/5 >> 10/19 ([facility] admission [facility]) Daptomycin 10/20>>  Microbiology Results: 10/16: blood x2 > MRSA 10/21: blood x 2 > neg F 10/22: Blood x 2 > 1/2 MRSA 10/24: urine multi sp, recollect 10/24: blood x 2 > 1/2 GPC clusters 10/26: blood x 2  Thank you for allowing pharmacy to be a part of this patient's care.  Georgina PillionElizabeth Keaja Reaume, PharmD, BCPS Clinical Pharmacist Pager: [phone] 10/02/2015 12:55 PM

## 2015-10-02 NOTE — Progress Notes (Signed)
Physical Therapy Treatment Patient Details Name: Lindsay Cobb MRN: [id number] DOB: 06/07/1941 Today's Date: 10/02/2015    History of Present Illness 74 y.o. female with L patellar fx sustained in a fall. She underwent partial patellectomy 07-11-15. Returned for infection of L knee and thigh, with irrigation and debridement before being d/c to nursing facility. 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    PT Comments    Pt fatigues quickly and demonstrates R sided lean, but able to obtain upright posture with reaching across midline and out of BOS. Con't to recommend SNF.  Follow Up Recommendations  SNF     Equipment Recommendations  None recommended by PT    Recommendations for Other Services       Precautions / Restrictions Precautions Precautions: Fall Precaution Comments: L AKA Restrictions LLE Weight Bearing: Non weight bearing    Mobility  Bed Mobility Overal bed mobility: Needs Assistance Bed Mobility: Supine to Sit;Sit to Supine     Supine to sit: Max assist;+2 for physical assistance;HOB elevated Sit to supine: Mod assist;+2 for physical assistance   General bed mobility comments: Pt able to initiate R LE movement and trunk a small amount to elft, but required MAX of 2 to obtain upright postuer with heavy multi-modal cueing for hand placement. With sit > supine, pt able to go down onto elbow, but needed A for getting hips turned and R Leg back onto bed.  Transfers                 General transfer comment: Attempted to have pt scoot at EOB, but pt unable to coordinate/process sequencing and hand placement.  Ambulation/Gait                 Stairs            Wheelchair Mobility    Modified Rankin (Stroke Patients Only)       Balance Overall balance assessment: Needs assistance Sitting-balance support: Single extremity supported (R LE supported) Sitting balance-Leahy Scale: Poor Sitting balance - Comments: Sat EOB  13 minutes with R LE on ground. Worked on  reaching across midline and out of BOS to work on centering trunk.  Pt tending to lean R and needed cues for maintaining upright posture.  Attempted scooting at EOB, but pt unable.  Pt very fatigued by end.  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  Husband aware of it from his mother having amputation.      Pertinent Vitals/Pain Pain Assessment: No/denies pain    Home Living                      Prior Function            PT Goals (current goals can now be found in the care plan section) Acute Rehab PT Goals Patient Stated Goal: to rehab then home PT Goal Formulation: With patient Time For Goal Achievement: 10/13/15 Potential to Achieve Goals: Fair  Progress towards PT goals: Progressing toward goals    Frequency  Min 2X/week    PT Plan Current plan remains appropriate    Co-evaluation             End of Session   Activity Tolerance: Patient limited by fatigue Patient left: in bed;with call bell/phone within reach;with bed alarm set     Time: 1610-9604 PT Time Calculation (min) (ACUTE ONLY): 24 min  Charges:  $Therapeutic Activity: 23-37 mins                    G Codes:      Darden Flemister LUBECK 10/02/2015, 1:14 PM

## 2015-10-02 NOTE — Progress Notes (Signed)
Audible expiratory wheezing 

## 2015-10-02 NOTE — Progress Notes (Signed)
Audible expiratory wheezing

## 2015-10-02 NOTE — H&P (View-Only) (Signed)
    [facility] has been requested to perform a transesophageal echocardiogram on Lindsay Cobb  for bacteremia.  After careful review of history and examination, the risks and benefits of transesophageal echocardiogram have been explained including risks of esophageal damage, perforation (1:10,000 risk), bleeding, pharyngeal hematoma as well as other potential complications associated with conscious sedation including aspiration, arrhythmia, respiratory failure and death. Alternatives to treatment were discussed, questions were answered. Patient is willing to proceed.   The above information was discussed with the patient, her husband, and her sister. They are all in agreement to proceed.  BP has been 120/54 - 129/61 in the past 24 hours with no requirement for pressors. Hgb at 8.7 (been stable since AKA on 09/28/2015). Platelets at 364.   Shekira Drummer M Sewell Pitner, PA-C 10/01/2015 4:17 PM   

## 2015-10-03 ENCOUNTER — Encounter: Payer: Self-pay | Admitting: Licensed Clinical Social Worker

## 2015-10-03 ENCOUNTER — Encounter (HOSPITAL_COMMUNITY): Payer: Self-pay | Admitting: Cardiovascular Disease

## 2015-10-03 ENCOUNTER — Other Ambulatory Visit: Payer: Self-pay | Admitting: Licensed Clinical Social Worker

## 2015-10-03 LAB — GLUCOSE, CAPILLARY
Glucose-Capillary: 168 mg/dL — ABNORMAL HIGH (ref 65–99)
Glucose-Capillary: 159 mg/dL — ABNORMAL HIGH (ref 65–99)
Glucose-Capillary: 157 mg/dL — ABNORMAL HIGH (ref 65–99)
Glucose-Capillary: 147 mg/dL — ABNORMAL HIGH (ref 65–99)

## 2015-10-03 LAB — CULTURE, BLOOD (ROUTINE X 2)

## 2015-10-03 LAB — RENAL FUNCTION PANEL
Albumin: 1.5 g/dL — ABNORMAL LOW (ref 3.5–5.0)
Anion gap: 6 (ref 5–15)
BUN: 24 mg/dL — ABNORMAL HIGH (ref 6–20)
CALCIUM: 7.4 mg/dL — AB (ref 8.9–10.3)
CO2: 21 mmol/L — ABNORMAL LOW (ref 22–32)
CREATININE: 1.14 mg/dL — AB (ref 0.44–1.00)
Chloride: 107 mmol/L (ref 101–111)
GFR, EST AFRICAN AMERICAN: 54 mL/min — AB (ref >60)
GFR, EST NON AFRICAN AMERICAN: 47 mL/min — AB (ref >60)
Glucose, Bld: 158 mg/dL — ABNORMAL HIGH (ref 65–99)
Phosphorus: 2.2 mg/dL — ABNORMAL LOW (ref 2.5–4.6)
Potassium: 3.3 mmol/L — ABNORMAL LOW (ref 3.5–5.1)
SODIUM: 134 mmol/L — AB (ref 135–145)

## 2015-10-03 MED ORDER — BENAZEPRIL HCL 40 MG PO TABS
20.0000 mg | ORAL_TABLET | Freq: Every day | ORAL | Status: DC
  Administered 2015-10-03 – 2015-10-08 (×6): 20 mg via ORAL
  Filled 2015-10-03 (×6): qty 1

## 2015-10-03 MED ORDER — FUROSEMIDE 40 MG PO TABS
40.0000 mg | ORAL_TABLET | Freq: Every day | ORAL | Status: DC
  Administered 2015-10-03 – 2015-10-08 (×6): 40 mg via ORAL
  Filled 2015-10-03 (×6): qty 1

## 2015-10-03 MED ORDER — ATENOLOL 50 MG PO TABS
50.0000 mg | ORAL_TABLET | Freq: Every day | ORAL | Status: DC
  Administered 2015-10-03 – 2015-10-08 (×6): 50 mg via ORAL
  Filled 2015-10-03 (×6): qty 1

## 2015-10-03 NOTE — Patient Outreach (Signed)
Assessment CSW received referral on Lindsay M. Rudie Meyer.  CSW completed chart review on client on 10/03/15. CSW spoke via phone with client on 10/03/15. CSW verified identity of client.  Client said she was at [facility] and is hoping to return to [facility] upon hospital discharge.  She said she had been at [facility] prior to hospitalization.  She said she had previously beeen getting physical therapy support while at [facility].  Client recently had surgery at [facility].  She and CSW spoke of the fact that she had met with Natividad Brood, [facility] and had completed [facility] consent form.  CSW and client reviewed and completed needed [facility] assessment information on client.  Client said she hopes to go to [facility] and after care at that facility hopes to return home. She had lived at home with her spouse who was very supportive prior to her going to [facility].  CSW thanked client for phone call on 10/03/15.  Client said she hopes to discharge soon from [facility] and hopes to return to [facility].  Plan: CSW to call client next week to assess needs of client at that time. CSW to continue to review EPIC notes on client to determine client discharge plan from [place].Gaetana Kawahara MSW, LCSW Licensed Clinical Social Worker [facility] [phone].

## 2015-10-03 NOTE — Progress Notes (Signed)
[facility] PROGRESS NOTE  Lindsay Cobb ZOX:[id number] DOB: 1941-07-24 DOA: 09/21/2015 PCP: [name], MD  brief narrative  67 ? Recent d/c from [facility] on 10/11 after undergoing incision and drainage of left knee by Dr. [name].  Wound cultures at that time indicated MRSA. She was discharged with a PICC line on vancomycin to skilled nursing facility. She'll return to the hospital with fevers and questionable pneumonia.  She was continued on vancomycin and started on Zosyn.  Blood cultures returned positive for Staphylococcus aureus.  Discussed with infectious disease who recommended transfer to [name] for orthopedic evaluation of left knee since it is felt that this is the likely source of her bacteremia. PICC line removed and echocardiogram done showing EF of 65-70% without wall motion abnormality or vegetation. Vancomycin was discontinued in favor of daptomycin due to rising creatinine.  After discussion with ID and ortho ( Dr [name]) pt transferred to [facility] for further evaluation.   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   Assessment/Plan:  Sepsis with Staph aureus bacteremia/ wound dehiscence with extensive left knee osteomyelitis Possible source is infected left knee On IV daptomycin.  Dr [name] and Dr [name] following .  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 continue abx for now.  Repeat Blood cultures 1/2 from 10/22 growing MRSA  BC from 10/24 failing to clear MRSA TEE performed 10/27 didn't show any Vegetation D/w Dr. 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  PT eval recommend skilled nursing facility.  Ongoing sepsis with acute encephalopathy noted 10/24 Patient less confused.  No new issues -Added Lactulose 10/25 and would continue  HCAP Afebrile. sats stable. Not in distress. continue daptomycin.  Acute  kidney injury Renal function much improved Creat 2.2->1.87-->1.5-->1.14.  Patient's basleine 0.8 suspect prerenal etiology. off vanco and ACEi.  Received a dose of IV lasix on 10/21 for volum overload. 2d echo with normal EF.  FeNa= 0.1 indicative pre-renal cause UA bland with mild leuks  renal ultrasound unremarkeable  Strict i/o Nephrology consulted appreciated Resumed lasix 40 po daily as sligfht swelling in feet/arms  Hypotension discontinued BP meds.  monitor with IV fluids.   Hyperkalemia  pt was receiving scheduled k supplements .  dc'ed and ordered kayexalate. resolved  Hyponatremia Stable in 134 range Normal for this patient between 134-140  Protein calorie malnutrition Added supplements.    Code Status: full  Family Communication:  Sister at bedside updated Disposition Plan: Continue inpatient monitoring until sepsis and mental status resolved. Discharged to skilled nursing facility once stable.  Consultants:  Dr [name]   Dr [name]  Antibiotics:  IV vancomycin 10/11-10/18 ( was on 8 weeks IV vancomycin since hospital discharge on 10/11  IV Zosyn 10/17-10/20  Cefazolin 10/21-10/22  IV daptomycin 10/20--   HPI/Subjective:  Mentation much improved fair no issues tol diet family bedside no other c/o of fever chills n/v  Objective: Filed Vitals:   10/03/15 0853  BP: 155/76  Pulse: 102  Temp: 98.6 F (37 C)  Resp: 18    Intake/Output Summary (Last 24 hours) at 10/03/15 1353 Last data filed at 10/03/15 0854  Gross per 24 hour  Intake   1470 ml  Output   1100 ml  Net    370 ml   Filed Weights   09/28/15 2035 09/29/15 2039  10/02/15 2058  Weight: 112.1 kg (247 lb 2.2 oz) 113 kg (249 lb 1.9 oz) 114 kg (251 lb 5.2 oz)    Exam:   General: Elderly female lying in bed  HEENT: moist mucosa, supple neck   Chest: clear b/l  CVS: NS1&S2  GI: soft, ND, NT, bowel sounds present, Foley placed on 10/23  Musculoskeletal: left AKA site  bandage appears clean.  No tenderness to surrounding skin no redness   CNS: Alert and awake-alert and awake compared to prior  Data Reviewed: Basic Metabolic Panel:  Recent Labs Lab 09/29/15 0503 09/30/15 0610 10/01/15 0454 10/02/15 0416 10/03/15 0605  NA 132* 130* 131* 133* 134*  K 5.7* 4.5 3.7 4.4 3.3*  CL 105 105 105 108 107  CO2 19* 17* 19* 16* 21*  GLUCOSE 211* 199* 170* 153* 158*  BUN 32* 30* 31* 29* 24*  CREATININE 2.27* 1.87* 1.54* 1.24* 1.14*  CALCIUM 7.7* 7.1* 7.0* 7.2* 7.4*  PHOS  --   --   --  2.4* 2.2*   Liver Function Tests:  Recent Labs Lab 09/27/15 2115 10/01/15 0454 10/02/15 0416 10/03/15 0605  AST 26 21  --   --   ALT 13* 13*  --   --   ALKPHOS 68 81  --   --   BILITOT 0.4 0.5  --   --   PROT 4.9* 3.9*  --   --   ALBUMIN 1.8* 1.4* 1.4* 1.5*   No results for input(s): LIPASE, AMYLASE in the last 168 hours.  Recent Labs Lab 09/30/15 0950  AMMONIA 53*   CBC:  Recent Labs Lab 09/27/15 2115 09/28/15 0431 09/29/15 0503 09/30/15 0610 10/01/15 0454  WBC 22.1* 20.9* 22.9* 22.2* 23.0*  NEUTROABS 12.9*  --   --   --  13.7*  HGB 9.6* 9.6* 9.6* 9.0* 8.7*  HCT 28.7* 30.8* 30.4* 28.2* 27.1*  MCV 77.6* 78.0 78.4 77.3* 78.3  PLT 405* 428* 435* 374 364   Cardiac Enzymes:  Recent Labs Lab 09/27/15 1210  CKTOTAL 21*   BNP (last 3 results)  Recent Labs  09/26/15 1414  BNP 48.0    ProBNP (last 3 results) No results for input(s): PROBNP in the last 8760 hours.  CBG:  Recent Labs Lab 10/02/15 1228 10/02/15 1707 10/02/15 2053 10/03/15 0747 10/03/15 1121  GLUCAP 149* 146* 138* 168* 159*    Recent Results (from the past 240 hour(s))  Culture, blood (routine x 2)     Status: None   Collection Time: 09/26/15  1:53 PM  Result Value Ref Range Status   Specimen Description BLOOD PICC LINE DRAWN BY RN  Final   Special Requests BOTTLES DRAWN AEROBIC AND ANAEROBIC 6CC EACH  Final   Culture NO GROWTH 5 DAYS  Final   Report Status  10/01/2015 FINAL  Final  Culture, blood (routine x 2)     Status: None   Collection Time: 09/26/15  1:54 PM  Result Value Ref Range Status   Specimen Description BLOOD LEFT ANTECUBITAL  Final   Special Requests BOTTLES DRAWN AEROBIC AND ANAEROBIC 6CC EACH  Final   Culture NO GROWTH 5 DAYS  Final   Report Status 10/01/2015 FINAL  Final  Culture, blood (routine x 2)     Status: None   Collection Time: 09/27/15  6:18 PM  Result Value Ref Range Status   Specimen Description BLOOD RIGHT ANTECUBITAL  Final   Special Requests BOTTLES DRAWN AEROBIC ONLY 9CC  Final   Culture NO GROWTH  5 DAYS  Final   Report Status 10/02/2015 FINAL  Final  Culture, blood (routine x 2)     Status: None   Collection Time: 09/27/15  6:25 PM  Result Value Ref Range Status   Specimen Description BLOOD LEFT ANTECUBITAL  Final   Special Requests IN PEDIATRIC BOTTLE 3CC  Final   Culture  Setup Time   Final    GRAM POSITIVE COCCI IN CLUSTERS IN PEDIATRIC BOTTLE CRITICAL RESULT CALLED TO, READ BACK BY AND VERIFIED WITH: Bonnee Quin [id number] WILDERK    Culture   Final    METHICILLIN RESISTANT STAPHYLOCOCCUS AUREUS SUSCEPTIBILITIES PERFORMED ON PREVIOUS CULTURE WITHIN THE LAST 5 DAYS.    Report Status 09/30/2015 FINAL  Final  Surgical pcr screen     Status: None   Collection Time: 09/27/15 11:53 PM  Result Value Ref Range Status   MRSA, PCR NEGATIVE NEGATIVE Final   Staphylococcus aureus NEGATIVE NEGATIVE Final    Comment:        The Xpert SA Assay (FDA approved for NASAL specimens in patients over 68 years of age), is one component of a comprehensive surveillance program.  Test performance has been validated by [facility] for patients greater than or equal to 9 year old. It is not intended to diagnose infection nor to guide or monitor treatment.   Culture, blood (routine x 2)     Status: None (Preliminary result)   Collection Time: 09/29/15  3:21 PM  Result Value Ref Range Status   Specimen  Description BLOOD RIGHT HAND  Final   Special Requests BOTTLES DRAWN AEROBIC ONLY 5CC  Final   Culture NO GROWTH 4 DAYS  Final   Report Status PENDING  Incomplete  Culture, blood (routine x 2)     Status: None   Collection Time: 09/29/15  4:20 PM  Result Value Ref Range Status   Specimen Description BLOOD RIGHT HAND  Final   Special Requests IN PEDIATRIC BOTTLE 3CC  Final   Culture  Setup Time   Final    GRAM POSITIVE COCCI IN CLUSTERS AEROBIC BOTTLE ONLY CRITICAL RESULT CALLED TO, READ BACK BY AND VERIFIED WITH: [facility]  10/01/15 MKELLY    Culture METHICILLIN RESISTANT STAPHYLOCOCCUS AUREUS  Final   Report Status 10/03/2015 FINAL  Final   Organism ID, Bacteria METHICILLIN RESISTANT STAPHYLOCOCCUS AUREUS  Final      Susceptibility   Methicillin resistant staphylococcus aureus - MIC*    CIPROFLOXACIN >=8 RESISTANT Resistant     ERYTHROMYCIN >=8 RESISTANT Resistant     GENTAMICIN <=0.5 SENSITIVE Sensitive     OXACILLIN >=4 RESISTANT Resistant     TETRACYCLINE <=1 SENSITIVE Sensitive     VANCOMYCIN 1 SENSITIVE Sensitive     TRIMETH/SULFA >=320 RESISTANT Resistant     CLINDAMYCIN <=0.25 SENSITIVE Sensitive     RIFAMPIN <=0.5 SENSITIVE Sensitive     Inducible Clindamycin NEGATIVE Sensitive     * METHICILLIN RESISTANT STAPHYLOCOCCUS AUREUS  Urine culture     Status: None   Collection Time: 09/29/15  4:46 PM  Result Value Ref Range Status   Specimen Description URINE, CATHETERIZED  Final   Special Requests NONE  Final   Culture MULTIPLE SPECIES PRESENT, SUGGEST RECOLLECTION  Final   Report Status 10/01/2015 FINAL  Final  Culture, blood (routine x 2)     Status: None (Preliminary result)   Collection Time: 10/01/15  4:25 PM  Result Value Ref Range Status   Specimen Description BLOOD RIGHT ARM  Final  Special Requests BOTTLES DRAWN AEROBIC ONLY 5CC  Final   Culture NO GROWTH 2 DAYS  Final   Report Status PENDING  Incomplete  Culture, blood (routine x 2)     Status: None  (Preliminary result)   Collection Time: 10/01/15  4:32 PM  Result Value Ref Range Status   Specimen Description BLOOD RIGHT HAND  Final   Special Requests BOTTLES DRAWN AEROBIC ONLY 3CC  Final   Culture NO GROWTH 2 DAYS  Final   Report Status PENDING  Incomplete     Studies: No results found.  Scheduled Meds: . atenolol  50 mg Oral Daily  . benazepril  20 mg Oral Daily  . collagenase   Topical Daily  . DAPTOmycin (CUBICIN)  IV  900 mg Intravenous Q24H  . feeding supplement (GLUCERNA SHAKE)  237 mL Oral BID BM  . feeding supplement (PRO-STAT SUGAR FREE 64)  30 mL Oral BID  . ferrous sulfate  325 mg Oral Q breakfast  . heparin  5,000 Units Subcutaneous 3 times per day  . insulin aspart  0-5 Units Subcutaneous QHS  . insulin aspart  0-9 Units Subcutaneous TID WC  . lactulose  20 g Oral BID  . linagliptin  5 mg Oral Daily   Continuous Infusions: . sodium chloride 10 mL/hr at 10/02/15 1440      Time spent: 15 minutes  Pleas Koch, MD [facility] (P) [phone]

## 2015-10-03 NOTE — Progress Notes (Signed)
[facility]    Subjective: No new complaints  Antibiotics:  Anti-infectives    Start     Dose/Rate Route Frequency Ordered Stop   10/03/15 1800  DAPTOmycin (CUBICIN) 900 mg in sodium chloride 0.9 % IVPB     900 mg 236 mL/hr over 30 Minutes Intravenous Every 24 hours 10/02/15 1256     09/26/15 1400  ceFAZolin (ANCEF) IVPB 2 g/50 mL premix  Status:  Discontinued     2 g 100 mL/hr over 30 Minutes Intravenous 3 times per day 09/26/15 1306 09/27/15 0851   09/25/15 1800  DAPTOmycin (CUBICIN) 700 mg in sodium chloride 0.9 % IVPB     700 mg 228 mL/hr over 30 Minutes Intravenous Every 24 hours 09/25/15 1650 10/02/15 1750   09/23/15 1800  vancomycin (VANCOCIN) 1,750 mg in sodium chloride 0.9 % 500 mL IVPB  Status:  Discontinued     1,750 mg 250 mL/hr over 120 Minutes Intravenous Every 24 hours 09/23/15 1327 09/25/15 1644   09/22/15 1200  vancomycin (VANCOCIN) IVPB 1000 mg/200 mL premix  Status:  Discontinued     1,000 mg 200 mL/hr over 60 Minutes Intravenous Every 12 hours 09/22/15 0945 09/23/15 1130   09/22/15 0800  piperacillin-tazobactam (ZOSYN) IVPB 3.375 g  Status:  Discontinued     3.375 g 12.5 mL/hr over 240 Minutes Intravenous Every 8 hours 09/22/15 0159 09/25/15 1644   09/22/15 0000  vancomycin (VANCOCIN) IVPB 1000 mg/200 mL premix     1,000 mg 200 mL/hr over 60 Minutes Intravenous Every 1 hr x 2 09/21/15 2336 09/22/15 0159   09/21/15 2330  piperacillin-tazobactam (ZOSYN) IVPB 3.375 g  Status:  Discontinued     3.375 g 100 mL/hr over 30 Minutes Intravenous 3 times per day 09/21/15 2321 09/22/15 0157      Medications: Scheduled Meds: . atenolol  50 mg Oral Daily  . benazepril  20 mg Oral Daily  . collagenase   Topical Daily  . DAPTOmycin (CUBICIN)  IV  900 mg Intravenous Q24H  . feeding supplement (GLUCERNA SHAKE)  237 mL Oral BID BM  . feeding supplement (PRO-STAT SUGAR FREE 64)  30 mL Oral BID  . ferrous sulfate  325 mg  Oral Q breakfast  . furosemide  40 mg Oral Daily  . heparin  5,000 Units Subcutaneous 3 times per day  . insulin aspart  0-5 Units Subcutaneous QHS  . insulin aspart  0-9 Units Subcutaneous TID WC  . lactulose  20 g Oral BID  . linagliptin  5 mg Oral Daily   Continuous Infusions: . sodium chloride 10 mL/hr at 10/02/15 1440   PRN Meds:.acetaminophen **OR** acetaminophen, albuterol, HYDROmorphone (DILAUDID) injection, [DISCONTINUED] ondansetron **OR** ondansetron (ZOFRAN) IV, ondansetron **OR** ondansetron (ZOFRAN) IV    Objective: Weight change:   Intake/Output Summary (Last 24 hours) at 10/03/15 1740 Last data filed at 10/03/15 1430  Gross per 24 hour  Intake    960 ml  Output    502 ml  Net    458 ml   Blood pressure 147/69, pulse 92, temperature 99.7 F (37.6 C), temperature source Oral, resp. rate 16, height  (1.676 m), weight 251 lb 5.2 oz (114 kg), SpO2 98 %. Temp:  [98.3 F (36.8 C)-99.7 F (37.6 C)] 99.7 F (37.6 C) (10/28 1640) Pulse Rate:  [92-104] 92 (10/28 1640) Resp:  [16-18] 16 (10/28 1640) BP: (147-159)/(59-76) 147/69 mmHg (10/28 1640) SpO2:  [  97 %-98 %] 98 % (10/28 1640) Weight:  [251 lb 5.2 oz (114 kg)] 251 lb 5.2 oz (114 kg) (10/27 2058)  Physical Exam: General: alert, awake oriented engaged in conversation with me  HEENT: anicteric sclera, EOMI, oropharynx clear and without exudate Cardiovascular: egular rate, normal r, no murmur rubs or gallops Pulmonary: clear to auscultation bilaterally, no wheezing, rales or rhonchi Gastrointestinal: soft nontender, nondistended, normal bowel sounds, Musculoskeletal: Left above-the-knee sie is clean with bandage over the surgical site Neuro: nonfocal, strength and sensation intact  CBC:  CBC Latest Ref Rng 10/01/2015 09/30/2015 09/29/2015  WBC 4.0 - 10.5 K/uL 23.0(H) 22.2(H) 22.9(H)  Hemoglobin 12.0 - 15.0 g/dL 0.8(M) 5.7(Q) 4.6(N)  Hematocrit 36.0 - 46.0 % 27.1(L) 28.2(L) 30.4(L)  Platelets 150 - 400  K/uL 364 374 435(H)      BMET  Recent Labs  10/02/15 0416 10/03/15 0605  NA 133* 134*  K 4.4 3.3*  CL 108 107  CO2 16* 21*  GLUCOSE 153* 158*  BUN 29* 24*  CREATININE 1.24* 1.14*  CALCIUM 7.2* 7.4*     Liver Panel   Recent Labs  10/01/15 0454 10/02/15 0416 10/03/15 0605  PROT 3.9*  --   --   ALBUMIN 1.4* 1.4* 1.5*  AST 21  --   --   ALT 13*  --   --   ALKPHOS 81  --   --   BILITOT 0.5  --   --        Sedimentation Rate No results for input(s): ESRSEDRATE in the last 72 hours. C-Reactive Protein No results for input(s): CRP in the last 72 hours.  Micro Results: Recent Results (from the past 720 hour(s))  Culture, blood (routine x 2)     Status: None   Collection Time: 09/21/15  8:16 PM  Result Value Ref Range Status   Specimen Description BLOOD A-LINE DRAW DRAWN BY RN  Final   Special Requests BOTTLES DRAWN AEROBIC AND ANAEROBIC 6CC  Final   Culture  Setup Time   Final    GRAM POSITIVE COCCI IN CLUSTERS RECOVERED FROM THE ANAEROBIC BOTTLE Gram Stain Report Called to,Read Back By and Verified With: COVINGTON,L. AT 1414 ON 09/24/2015 BY BAUGHAM,M. Performed at [facility]    Culture   Final    METHICILLIN RESISTANT STAPHYLOCOCCUS AUREUS Performed at [facility]    Report Status 09/27/2015 FINAL  Final   Organism ID, Bacteria METHICILLIN RESISTANT STAPHYLOCOCCUS AUREUS  Final      Susceptibility   Methicillin resistant staphylococcus aureus - MIC*    CIPROFLOXACIN >=8 RESISTANT Resistant     ERYTHROMYCIN >=8 RESISTANT Resistant     GENTAMICIN <=0.5 SENSITIVE Sensitive     OXACILLIN >=4 RESISTANT Resistant     TETRACYCLINE <=1 SENSITIVE Sensitive     VANCOMYCIN 1 SENSITIVE Sensitive     TRIMETH/SULFA >=320 RESISTANT Resistant     CLINDAMYCIN <=0.25 SENSITIVE Sensitive     RIFAMPIN <=0.5 SENSITIVE Sensitive     Inducible Clindamycin NEGATIVE Sensitive     * METHICILLIN RESISTANT STAPHYLOCOCCUS AUREUS  Culture, blood (routine x 2)      Status: None   Collection Time: 09/21/15 11:30 PM  Result Value Ref Range Status   Specimen Description BLOOD RIGHT ANTECUBITAL  Final   Special Requests BOTTLES DRAWN AEROBIC AND ANAEROBIC 6CC  Final   Culture  Setup Time   Final    GRAM POSITIVE COCCI IN CLUSTERS RECOVERED FROM THE AEROBIC BOTTLE Gram Stain Report Called to,Read Back  By and Verified With: DAVIS,L. AT 1822 ON 09/25/2015 BY BAUGHAM,M. Performed at [facility]    Culture   Final    METHICILLIN RESISTANT STAPHYLOCOCCUS AUREUS Performed at [facility]    Report Status 10/01/2015 FINAL  Final   Organism ID, Bacteria METHICILLIN RESISTANT STAPHYLOCOCCUS AUREUS  Final      Susceptibility   Methicillin resistant staphylococcus aureus - MIC*    CIPROFLOXACIN >=8 RESISTANT Resistant     ERYTHROMYCIN >=8 RESISTANT Resistant     GENTAMICIN <=0.5 SENSITIVE Sensitive     OXACILLIN >=4 RESISTANT Resistant     TETRACYCLINE <=1 SENSITIVE Sensitive     VANCOMYCIN <=0.5 SENSITIVE Sensitive     TRIMETH/SULFA 160 RESISTANT Resistant     CLINDAMYCIN <=0.25 SENSITIVE Sensitive     RIFAMPIN <=0.5 SENSITIVE Sensitive     Inducible Clindamycin NEGATIVE Sensitive     * METHICILLIN RESISTANT STAPHYLOCOCCUS AUREUS  Urine culture     Status: None   Collection Time: 09/21/15 11:53 PM  Result Value Ref Range Status   Specimen Description URINE, CLEAN CATCH  Final   Special Requests NONE  Final   Culture   Final    MULTIPLE SPECIES PRESENT, SUGGEST RECOLLECTION Performed [facility]    Report Status 09/23/2015 FINAL  Final  MRSA PCR Screening     Status: None   Collection Time: 09/22/15  1:52 PM  Result Value Ref Range Status   MRSA by PCR NEGATIVE NEGATIVE Final    Comment:        The GeneXpert MRSA Assay (FDA approved for NASAL specimens only), is one component of a comprehensive MRSA colonization surveillance program. It is not intended to diagnose MRSA infection nor to guide or monitor treatment  for MRSA infections.   Culture, blood (routine x 2)     Status: None   Collection Time: 09/26/15  1:53 PM  Result Value Ref Range Status   Specimen Description BLOOD PICC LINE DRAWN BY RN  Final   Special Requests BOTTLES DRAWN AEROBIC AND ANAEROBIC 6CC EACH  Final   Culture NO GROWTH 5 DAYS  Final   Report Status 10/01/2015 FINAL  Final  Culture, blood (routine x 2)     Status: None   Collection Time: 09/26/15  1:54 PM  Result Value Ref Range Status   Specimen Description BLOOD LEFT ANTECUBITAL  Final   Special Requests BOTTLES DRAWN AEROBIC AND ANAEROBIC 6CC EACH  Final   Culture NO GROWTH 5 DAYS  Final   Report Status 10/01/2015 FINAL  Final  Culture, blood (routine x 2)     Status: None   Collection Time: 09/27/15  6:18 PM  Result Value Ref Range Status   Specimen Description BLOOD RIGHT ANTECUBITAL  Final   Special Requests BOTTLES DRAWN AEROBIC ONLY 9CC  Final   Culture NO GROWTH 5 DAYS  Final   Report Status 10/02/2015 FINAL  Final  Culture, blood (routine x 2)     Status: None   Collection Time: 09/27/15  6:25 PM  Result Value Ref Range Status   Specimen Description BLOOD LEFT ANTECUBITAL  Final   Special Requests IN PEDIATRIC BOTTLE 3CC  Final   Culture  Setup Time   Final    GRAM POSITIVE COCCI IN CLUSTERS IN PEDIATRIC BOTTLE CRITICAL RESULT CALLED TO, READ BACK BY AND VERIFIED WITH: Bonnee QuinK GANGLER,RN [phone] WILDERK    Culture   Final    METHICILLIN RESISTANT STAPHYLOCOCCUS AUREUS SUSCEPTIBILITIES PERFORMED ON PREVIOUS CULTURE  WITHIN THE LAST 5 DAYS.    Report Status 09/30/2015 FINAL  Final  Surgical pcr screen     Status: None   Collection Time: 09/27/15 11:53 PM  Result Value Ref Range Status   MRSA, PCR NEGATIVE NEGATIVE Final   Staphylococcus aureus NEGATIVE NEGATIVE Final    Comment:        The Xpert SA Assay (FDA approved for NASAL specimens in patients over 18 years of age), is one component of a comprehensive surveillance program.  Test performance  has been validated by [facility] for patients greater than or equal to 49 year old. It is not intended to diagnose infection nor to guide or monitor treatment.   Culture, blood (routine x 2)     Status: None (Preliminary result)   Collection Time: 09/29/15  3:21 PM  Result Value Ref Range Status   Specimen Description BLOOD RIGHT HAND  Final   Special Requests BOTTLES DRAWN AEROBIC ONLY 5CC  Final   Culture NO GROWTH 4 DAYS  Final   Report Status PENDING  Incomplete  Culture, blood (routine x 2)     Status: None   Collection Time: 09/29/15  4:20 PM  Result Value Ref Range Status   Specimen Description BLOOD RIGHT HAND  Final   Special Requests IN PEDIATRIC BOTTLE 3CC  Final   Culture  Setup Time   Final    GRAM POSITIVE COCCI IN CLUSTERS AEROBIC BOTTLE ONLY CRITICAL RESULT CALLED TO, READ BACK BY AND VERIFIED WITH: [facility] @0502  10/01/15 MKELLY    Culture METHICILLIN RESISTANT STAPHYLOCOCCUS AUREUS  Final   Report Status 10/03/2015 FINAL  Final   Organism ID, Bacteria METHICILLIN RESISTANT STAPHYLOCOCCUS AUREUS  Final      Susceptibility   Methicillin resistant staphylococcus aureus - MIC*    CIPROFLOXACIN >=8 RESISTANT Resistant     ERYTHROMYCIN >=8 RESISTANT Resistant     GENTAMICIN <=0.5 SENSITIVE Sensitive     OXACILLIN >=4 RESISTANT Resistant     TETRACYCLINE <=1 SENSITIVE Sensitive     VANCOMYCIN 1 SENSITIVE Sensitive     TRIMETH/SULFA >=320 RESISTANT Resistant     CLINDAMYCIN <=0.25 SENSITIVE Sensitive     RIFAMPIN <=0.5 SENSITIVE Sensitive     Inducible Clindamycin NEGATIVE Sensitive     * METHICILLIN RESISTANT STAPHYLOCOCCUS AUREUS  Urine culture     Status: None   Collection Time: 09/29/15  4:46 PM  Result Value Ref Range Status   Specimen Description URINE, CATHETERIZED  Final   Special Requests NONE  Final   Culture MULTIPLE SPECIES PRESENT, SUGGEST RECOLLECTION  Final   Report Status 10/01/2015 FINAL  Final  Culture, blood (routine x 2)     Status: None  (Preliminary result)   Collection Time: 10/01/15  4:25 PM  Result Value Ref Range Status   Specimen Description BLOOD RIGHT ARM  Final   Special Requests BOTTLES DRAWN AEROBIC ONLY 5CC  Final   Culture NO GROWTH 2 DAYS  Final   Report Status PENDING  Incomplete  Culture, blood (routine x 2)     Status: None (Preliminary result)   Collection Time: 10/01/15  4:32 PM  Result Value Ref Range Status   Specimen Description BLOOD RIGHT HAND  Final   Special Requests BOTTLES DRAWN AEROBIC ONLY 3CC  Final   Culture NO GROWTH 2 DAYS  Final   Report Status PENDING  Incomplete    Studies/Results: No results found.    Assessment/Plan:  INTERVAL HISTORY:  09/28/2015: Patient is status post above-the-knee  imitation left 09/29/15: 10//22/16 CULTURES STILL POSITIVE MRSA 10/01/15: 09/29/15 cultures [facility] in 1/2 bottles  Principal Problem:   HCAP (healthcare-associated pneumonia) Active Problems:   Type II diabetes mellitus (HCC)   Essential hypertension, benign   Obesity   Microcytic anemia   Wound dehiscence, surgical   Hypokalemia   Decubitus ulcer of left buttock, unstageable (HCC)   Absolute anemia   Staphylococcus aureus bacteremia   Acute renal failure (HCC)   Atelectasis   Bacterial infection of knee joint (HCC)   Staphylococcus aureus bacteremia with sepsis (HCC)   MRSA bacteremia   Chronic osteomyelitis of right femur (HCC)   Subacute osteomyelitis of right tibia (HCC)   Acute kidney injury (HCC)   Hyperkalemia   Idiopathic hypotension   Acute encephalopathy   Bacteremia    Lindsay Cobb is a 74 y.o. 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 Specific aspects of clinical management are critical to optimizing the outcome of patients with SAB. Therefore, the [facility]imicrobial Management Team [facility]) has initiated an intervention aimed at improving the management of SAB at [facility]. To do so, Infectious Diseases physicians are providing an evidence-based consult for the management of all patients with SAB.     Yes No Comments  Perform follow-up blood cultures (even if the patient is afebrile) to ensure clearance of bacteremia [X]  [ ]  And repeated cultures after PICC removal10/22/16 but STILL POSITIVE, from 1/2 10/22 and on 09/29/15 as well but blood cultures from 10/01/15 NGTD  Remove vascular catheter and obtain follow-up blood cultures after the removal of the catheter [X]  [ ]  DO NOT PLACE PICC FOR UNTIL 10/01/15 cultures are negative at 5 days and FINAL  On 10/06/15  Perform echocardiography to evaluate for endocarditis (transthoracic ECHO is 40-50% sensitive, TEE is > 90% sensitive) [ ]  [ ]  TEE negative  Consult electrophysiologist to evaluate implanted cardiac device (pacemaker, ICD) [ ]  [ ]  NA   Ensure source control [ ]  [ ]  Have all abscesses been drained effectively? Have deep seeded infections (septic joints or osteomyelitis) had appropriate surgical debridement?  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  Keep in mind that for deep seeded infections MRI imaging with contrast is preferred rather than  other often insensitive tests such as plain x-rays, especially early in a patient's presentation. Not clear any other sites at this time  She had SOME back pain but it is not really prominent at present   Change antibiotic therapy to daptomycin at high dose [ ]  [ ]  Beta-lactam antibiotics are preferred for MSSA due to higher cure rates.  If on Vancomycin, goal trough should be 15 - 20 mcg/mL  Estimated duration of IV antibiotic therapy:  8 weeks,  [ ]  [ ]   Consult case management for probably prolonged outpatient IV antibiotic therapy   Screening:HIV negative,      Dr. Ninetta Lights covering this weekend for questions.     LOS: 11 days   Acey Lav 10/03/2015, 5:40 PM

## 2015-10-03 NOTE — Care Management Note (Addendum)
Case Management Note  Patient Details  Name: Lindsay Cobb MRN: [id number] Date of Birth: 1941/09/08  Subjective/Objective:              CM following for progression and d/c planning.      Action/Plan: 10/03/2015 Met with pt again today, pt increasingly alert able to discuss d/c plan, states that she plans to return to SNF, [facility], for short term rehab then home with husband who is able to assist as needed. No DME needs at this time. HH and DME will be arrange by SNF at the time of d/c from that facility.  Pt has PCP. No other needs identified.   Expected Discharge Date:      10/07/2015            Expected Discharge Plan:  Skilled Nursing Facility  In-House Referral:  Clinical Social Work  Discharge planning Services  CM Consult  Post Acute Care Choice:  NA Choice offered to:  NA  DME Arranged:   NA DME Agency:   NA  HH Arranged:   NA HH Agency:   NA  Status of Service:  Completed, signed off  Medicare Important Message Given:  Yes-third notification given Date Medicare IM Given:    Medicare IM give by:    Date Additional Medicare IM Given:    Additional Medicare Important Message give by:     If discussed at [place] of Stay Meetings, dates discussed:   09/30/2015 , 10/02/2015 Additional Comments:  Adron Bene, RN 10/03/2015, 10:32 AM

## 2015-10-04 LAB — GLUCOSE, CAPILLARY
GLUCOSE-CAPILLARY: 188 mg/dL — AB (ref 65–99)
Glucose-Capillary: 151 mg/dL — ABNORMAL HIGH (ref 65–99)
Glucose-Capillary: 144 mg/dL — ABNORMAL HIGH (ref 65–99)
Glucose-Capillary: 147 mg/dL — ABNORMAL HIGH (ref 65–99)

## 2015-10-04 LAB — CULTURE, BLOOD (ROUTINE X 2): CULTURE: NO GROWTH

## 2015-10-04 NOTE — Progress Notes (Signed)
[facility] PROGRESS NOTE  Nani SkillernHelen M Tencza ZOX:[id number] DOB: 08/18/1941 DOA: 09/21/2015 PCP: Milinda AntisURHAM, KAWANTA, MD  brief narrative  74 ? Recent d/[facility] from [facility] on [facility]11 after undergoing incision and drainage of left knee by Dr. Roda ShuttersXu.  Wound cultures at that time indicated MRSA. She was discharged with a PICC line on vancomycin to skilled nursing facility. She'll return to the hospital with fevers and questionable pneumonia.  She was continued on vancomycin and started on Zosyn.  Blood cultures returned positive for Staphylococcus aureus.  Discussed with infectious disease who recommended [facility] of left knee since it is felt that this is the likely source of her bacteremia. PICC line removed and echocardiogram done showing EF of 65-70% without wall motion abnormality or vegetation. Vancomycin was discontinued in favor of daptomycin due to rising creatinine.  After discussion with ID and ortho ( Dr Lajoyce Cornersuda) pt [facility].   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 SHe has failed to clear bacteremia and will need neg bc x2 prior to d/c with indwelling PICC   Assessment/Plan:  Sepsis with Staph aureus bacteremia/ wound dehiscence with extensive left knee osteomyelitis Possible source is infected left knee On IV daptomycin.  Dr 74 Niecevan dam and Dr Lajoyce Cornersuda following .  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 continue abx for now.  Repeat Blood cultures 1/2 from 10/22 growing MRSA  BC from 10/24 failing to clear MRSA TEE performed 10/27 didn't show any Vegetation D/w Dr. 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  PT eval recommend skilled nursing facility.  Ongoing sepsis with acute encephalopathy noted 10/24 Patient less confused.  No new issues -Added Lactulose 10/25 and  would continue  HCAP Afebrile. sats stable. Not in distress. continue daptomycin.  Acute kidney injury Renal function much improved Creat 2.2->1.87-->1.5-->1.14.  Patient's basleine 0.8 suspect prerenal etiology. off vanco and ACEi.  Received a dose of IV lasix on 10/21 for volum overload. 2d echo with normal EF.  FeNa= 0.1 indicative pre-renal cause UA bland with mild leuks  renal ultrasound unremarkeable  Strict i/o Nephrology consulted appreciated Resumed lasix 40 po daily as sligfht swelling in feet/arms  Hypotension discontinued BP meds.  monitor with IV fluids.   Hyperkalemia  pt was receiving scheduled k supplements .  dc'ed and ordered kayexalate. resolved  Hyponatremia Stable in 134 range Normal for this patient between 134-140 Rpt am labs  Protein calorie malnutrition Added supplements.    Code Status: full  Family Communication:  Sister at bedside updated Disposition Plan: Continue inpatient monitoring until sepsis and mental status resolved. Discharged to skilled nursing facility once stable.  Consultants:  Dr Zenaida NieceVan dam   Dr Lajoyce Cornersuda  Antibiotics:  IV vancomycin 10/11-10/18 ( was on 8 weeks IV vancomycin since hospital discharge on 10/11  IV Zosyn 10/17-10/20  Cefazolin 10/21-10/22  IV daptomycin 10/20--   HPI/Subjective:  fair No c/o No diarr No cp Not confused  Objective: Filed Vitals:   10/04/15 0910  BP: 161/70  Pulse: 86  Temp: 97.9 F (36.6 C)  Resp: 19    Intake/Output Summary (Last 24 hours) at 10/04/15 1429 Last data filed at 10/04/15 0900  Gross per 24 hour  Intake    718 ml  Output   1202 ml  Net   -  484 ml   Filed Weights   09/28/15 2035 09/29/15 2039 10/02/15 2058  Weight: 112.1 kg (247 lb 2.2 oz) 113 kg (249 lb 1.9 oz) 114 kg (251 lb 5.2 oz)    Exam:   General: Elderly female lying in bed  HEENT: moist mucosa, supple neck   Chest: clear b/l  CVS: NS1&S2  GI: soft, ND, NT, bowel sounds present, Foley  placed on 10/23  Musculoskeletal: left AKA site bandage appears clean.    Data Reviewed: Basic Metabolic Panel:  Recent Labs Lab 09/29/15 0503 09/30/15 0610 10/01/15 0454 10/02/15 0416 10/03/15 0605  NA 132* 130* 131* 133* 134*  K 5.7* 4.5 3.7 4.4 3.3*  CL 105 105 105 108 107  CO2 19* 17* 19* 16* 21*  GLUCOSE 211* 199* 170* 153* 158*  BUN 32* 30* 31* 29* 24*  CREATININE 2.27* 1.87* 1.54* 1.24* 1.14*  CALCIUM 7.7* 7.1* 7.0* 7.2* 7.4*  PHOS  --   --   --  2.4* 2.2*   Liver Function Tests:  Recent Labs Lab 09/27/15 2115 10/01/15 0454 10/02/15 0416 10/03/15 0605  AST 26 21  --   --   ALT 13* 13*  --   --   ALKPHOS 68 81  --   --   BILITOT 0.4 0.5  --   --   PROT 4.9* 3.9*  --   --   ALBUMIN 1.8* 1.4* 1.4* 1.5*   No results for input(s): LIPASE, AMYLASE in the last 168 hours.  Recent Labs Lab 09/30/15 0950  AMMONIA 53*   CBC:  Recent Labs Lab 09/27/15 2115 09/28/15 0431 09/29/15 0503 09/30/15 0610 10/01/15 0454  WBC 22.1* 20.9* 22.9* 22.2* 23.0*  NEUTROABS 12.9*  --   --   --  13.7*  HGB 9.6* 9.6* 9.6* 9.0* 8.7*  HCT 28.7* 30.8* 30.4* 28.2* 27.1*  MCV 77.6* 78.0 78.4 77.3* 78.3  PLT 405* 428* 435* 374 364   Cardiac Enzymes: No results for input(s): CKTOTAL, CKMB, CKMBINDEX, TROPONINI in the last 168 hours. BNP (last 3 results)  Recent Labs  09/26/15 1414  BNP 48.0    ProBNP (last 3 results) No results for input(s): PROBNP in the last 8760 hours.  CBG:  Recent Labs Lab 10/03/15 1121 10/03/15 1640 10/03/15 2056 10/04/15 0804 10/04/15 1133  GLUCAP 159* 157* 147* 188* 151*    Recent Results (from the past 240 hour(s))  Culture, blood (routine x 2)     Status: None   Collection Time: 09/26/15  1:53 PM  Result Value Ref Range Status   Specimen Description BLOOD PICC LINE DRAWN BY RN  Final   Special Requests BOTTLES DRAWN AEROBIC AND ANAEROBIC 6CC EACH  Final   Culture NO GROWTH 5 DAYS  Final   Report Status 10/01/2015 FINAL  Final   Culture, blood (routine x 2)     Status: None   Collection Time: 09/26/15  1:54 PM  Result Value Ref Range Status   Specimen Description BLOOD LEFT ANTECUBITAL  Final   Special Requests BOTTLES DRAWN AEROBIC AND ANAEROBIC 6CC EACH  Final   Culture NO GROWTH 5 DAYS  Final   Report Status 10/01/2015 FINAL  Final  Culture, blood (routine x 2)     Status: None   Collection Time: 09/27/15  6:18 PM  Result Value Ref Range Status   Specimen Description BLOOD RIGHT ANTECUBITAL  Final   Special Requests BOTTLES DRAWN AEROBIC ONLY 9CC  Final   Culture NO GROWTH 5 DAYS  Final   Report Status 10/02/2015 FINAL  Final  Culture, blood (routine x 2)     Status: None   Collection Time: 09/27/15  6:25 PM  Result Value Ref Range Status   Specimen Description BLOOD LEFT ANTECUBITAL  Final   Special Requests IN PEDIATRIC BOTTLE 3CC  Final   Culture  Setup Time   Final    GRAM POSITIVE COCCI IN CLUSTERS IN PEDIATRIC BOTTLE CRITICAL RESULT CALLED TO, READ BACK BY AND VERIFIED WITH: Bonnee Quin [id number] WILDERK    Culture   Final    METHICILLIN RESISTANT STAPHYLOCOCCUS AUREUS SUSCEPTIBILITIES PERFORMED ON PREVIOUS CULTURE WITHIN THE LAST 5 DAYS.    Report Status 09/30/2015 FINAL  Final  Surgical pcr screen     Status: None   Collection Time: 09/27/15 11:53 PM  Result Value Ref Range Status   MRSA, PCR NEGATIVE NEGATIVE Final   Staphylococcus aureus NEGATIVE NEGATIVE Final    Comment:        The Xpert SA Assay (FDA approved for NASAL specimens in patients over 78 years of age), is one component of a comprehensive surveillance program.  Test performance has been validated by [facility] for patients greater than or equal to 25 year old. It is not intended to diagnose infection nor to guide or monitor treatment.   Culture, blood (routine x 2)     Status: None   Collection Time: 09/29/15  3:21 PM  Result Value Ref Range Status   Specimen Description BLOOD RIGHT HAND  Final   Special  Requests BOTTLES DRAWN AEROBIC ONLY 5CC  Final   Culture NO GROWTH 5 DAYS  Final   Report Status 10/04/2015 FINAL  Final  Culture, blood (routine x 2)     Status: None   Collection Time: 09/29/15  4:20 PM  Result Value Ref Range Status   Specimen Description BLOOD RIGHT HAND  Final   Special Requests IN PEDIATRIC BOTTLE 3CC  Final   Culture  Setup Time   Final    GRAM POSITIVE COCCI IN CLUSTERS AEROBIC BOTTLE ONLY CRITICAL RESULT CALLED TO, READ BACK BY AND VERIFIED WITH: [facility] @0502  10/01/15 MKELLY    Culture METHICILLIN RESISTANT STAPHYLOCOCCUS AUREUS  Final   Report Status 10/03/2015 FINAL  Final   Organism ID, Bacteria METHICILLIN RESISTANT STAPHYLOCOCCUS AUREUS  Final      Susceptibility   Methicillin resistant staphylococcus aureus - MIC*    CIPROFLOXACIN >=8 RESISTANT Resistant     ERYTHROMYCIN >=8 RESISTANT Resistant     GENTAMICIN <=0.5 SENSITIVE Sensitive     OXACILLIN >=4 RESISTANT Resistant     TETRACYCLINE <=1 SENSITIVE Sensitive     VANCOMYCIN 1 SENSITIVE Sensitive     TRIMETH/SULFA >=320 RESISTANT Resistant     CLINDAMYCIN <=0.25 SENSITIVE Sensitive     RIFAMPIN <=0.5 SENSITIVE Sensitive     Inducible Clindamycin NEGATIVE Sensitive     * METHICILLIN RESISTANT STAPHYLOCOCCUS AUREUS  Urine culture     Status: None   Collection Time: 09/29/15  4:46 PM  Result Value Ref Range Status   Specimen Description URINE, CATHETERIZED  Final   Special Requests NONE  Final   Culture MULTIPLE SPECIES PRESENT, SUGGEST RECOLLECTION  Final   Report Status 10/01/2015 FINAL  Final  Culture, blood (routine x 2)     Status: None (Preliminary result)   Collection Time: 10/01/15  4:25 PM  Result Value Ref Range Status   Specimen Description BLOOD RIGHT ARM  Final   Special Requests BOTTLES DRAWN  AEROBIC ONLY 5CC  Final   Culture NO GROWTH 3 DAYS  Final   Report Status PENDING  Incomplete  Culture, blood (routine x 2)     Status: None (Preliminary result)   Collection Time:  10/01/15  4:32 PM  Result Value Ref Range Status   Specimen Description BLOOD RIGHT HAND  Final   Special Requests BOTTLES DRAWN AEROBIC ONLY 3CC  Final   Culture NO GROWTH 3 DAYS  Final   Report Status PENDING  Incomplete     Studies: No results found.  Scheduled Meds: . atenolol  50 mg Oral Daily  . benazepril  20 mg Oral Daily  . collagenase   Topical Daily  . DAPTOmycin (CUBICIN)  IV  900 mg Intravenous Q24H  . feeding supplement (GLUCERNA SHAKE)  237 mL Oral BID BM  . feeding supplement (PRO-STAT SUGAR FREE 64)  30 mL Oral BID  . ferrous sulfate  325 mg Oral Q breakfast  . furosemide  40 mg Oral Daily  . heparin  5,000 Units Subcutaneous 3 times per day  . insulin aspart  0-5 Units Subcutaneous QHS  . insulin aspart  0-9 Units Subcutaneous TID WC  . lactulose  20 g Oral BID  . linagliptin  5 mg Oral Daily   Continuous Infusions: . sodium chloride 10 mL/hr at 10/02/15 1440      Time spent: 15 minutes  Pleas Koch, MD [facility] (P) [phone]

## 2015-10-04 NOTE — NC FL2 (Signed)
[place] MEDICAID FL2 LEVEL OF CARE SCREENING TOOL     IDENTIFICATION  Patient Name: Lindsay Cobb Birthdate: 1941/01/11 Sex: female Admission Date (Current Location): 09/21/2015  [facility] and [place] Number: [employer] and Address:  The . [facility], [address] [place], [place] [address]      Provider Number: [id number]  Attending Physician Name and Address:  Rhetta Mura, MD  Relative Name and Phone Number:       Current Level of Care: Hospital Recommended Level of Care: Skilled Nursing Facility Prior Approval Number:    Date Approved/Denied:   PASRR Number:    Discharge Plan: SNF    Current Diagnoses: Patient Active Problem List   Diagnosis Date Noted  . Bacteremia   . Acute kidney injury (HCC)   . Hyperkalemia   . Idiopathic hypotension   . Acute encephalopathy   . Atelectasis 09/27/2015  . Bacterial infection of knee joint (HCC) 09/27/2015  . Staphylococcus aureus bacteremia with sepsis (HCC)   . MRSA bacteremia   . Chronic osteomyelitis of right femur (HCC)   . Subacute osteomyelitis of right tibia (HCC)   . Staphylococcus aureus bacteremia 09/26/2015  . Acute renal failure (HCC) 09/26/2015  . Absolute anemia   . Decubitus ulcer of left buttock, unstageable (HCC) 09/23/2015  . HCAP (healthcare-associated pneumonia) 09/22/2015  . Hypokalemia 09/22/2015  . Open knee wound 09/10/2015  . Wound dehiscence, surgical 08/15/2015  . S/P ORIF (open reduction internal fixation) fracture 07/11/2015  . Callus of foot 05/21/2014  . Microcytic anemia 03/15/2013  . Type II diabetes mellitus (HCC) 07/20/2012  . Essential hypertension, benign 07/20/2012  . Obesity 07/20/2012  . Anxiety 07/20/2012  . Peripheral edema 07/20/2012    Orientation ACTIVITIES/SOCIAL BLADDER RESPIRATION    Self, Place  Active Incontinent Normal  BEHAVIORAL SYMPTOMS/MOOD NEUROLOGICAL BOWEL NUTRITION STATUS      Incontinent Diet (Carb  modified)  PHYSICIAN VISITS COMMUNICATION OF NEEDS Height & Weight Skin  30 days Verbally   251 lbs. PU Stage and Appropriate Care, Other (Comment) (Moisture damage to right and left groin)          AMBULATORY STATUS RESPIRATION    Assist extensive Normal      Personal Care Assistance Level of Assistance  Bathing, Feeding, Dressing Bathing Assistance: Limited assistance Feeding assistance: Limited assistance Dressing Assistance: Limited assistance      Functional Limitations Info  Speech     Speech Info: Impaired       SPECIAL CARE FACTORS FREQUENCY  PT (By licensed PT), OT (By licensed OT)     PT Frequency: 5 OT Frequency: 5           Additional Factors Info  Code Status, Allergies Code Status Info: Full Allergies Info: NKA           Current Medications (10/04/2015): Current Facility-Administered Medications  Medication Dose Route Frequency Provider Last Rate Last Dose  . 0.9 %  sodium chloride infusion   Intravenous Continuous Nadara Mustard, MD 10 mL/hr at 10/02/15 1440    . acetaminophen (TYLENOL) tablet 650 mg  650 mg Oral Q6H PRN Nadara Mustard, MD   650 mg at 09/30/15 1850   Or  . acetaminophen (TYLENOL) suppository 650 mg  650 mg Rectal Q6H PRN Nadara Mustard, MD      . albuterol (PROVENTIL) (2.5 MG/3ML) 0.083% nebulizer solution 2.5 mg  2.5 mg Nebulization Q6H PRN Nishant Dhungel, MD      . atenolol (  TENORMIN) tablet 50 mg  50 mg Oral Daily Rhetta MuraJai-Gurmukh Samtani, MD   50 mg at 10/04/15 0948  . benazepril (LOTENSIN) tablet 20 mg  20 mg Oral Daily Rhetta MuraJai-Gurmukh Samtani, MD   20 mg at 10/04/15 0948  . collagenase (SANTYL) ointment   Topical Daily Elliot Cousinenise Fisher, MD      . DAPTOmycin (CUBICIN) 900 mg in sodium chloride 0.9 % IVPB  900 mg Intravenous Q24H Ann Heldlizabeth J Martin, RPH   900 mg at 10/03/15 2019  . feeding supplement (GLUCERNA SHAKE) (GLUCERNA SHAKE) liquid 237 mL  237 mL Oral BID BM Marinell BlightStephanie L Craig, RD   237 mL at 10/04/15 1000  . feeding supplement  (PRO-STAT SUGAR FREE 64) liquid 30 mL  30 mL Oral BID Marinell BlightStephanie L Craig, RD   30 mL at 10/04/15 0949  . ferrous sulfate tablet 325 mg  325 mg Oral Q breakfast Houston SirenPeter Le, MD   325 mg at 10/04/15 0827  . furosemide (LASIX) tablet 40 mg  40 mg Oral Daily Rhetta MuraJai-Gurmukh Samtani, MD   40 mg at 10/04/15 0948  . heparin injection 5,000 Units  5,000 Units Subcutaneous 3 times per day Houston SirenPeter Le, MD   5,000 Units at 10/03/15 2148  . HYDROmorphone (DILAUDID) injection 0.5 mg  0.5 mg Intravenous Q3H PRN Nishant Dhungel, MD   0.5 mg at 09/30/15 0831  . insulin aspart (novoLOG) injection 0-5 Units  0-5 Units Subcutaneous QHS Elliot Cousinenise Fisher, MD   2 Units at 09/29/15 2214  . insulin aspart (novoLOG) injection 0-9 Units  0-9 Units Subcutaneous TID WC Elliot Cousinenise Fisher, MD   2 Units at 10/04/15 0827  . lactulose (CHRONULAC) 10 GM/15ML solution 20 g  20 g Oral BID Rhetta MuraJai-Gurmukh Samtani, MD   20 g at 10/04/15 0947  . linagliptin (TRADJENTA) tablet 5 mg  5 mg Oral Daily Houston SirenPeter Le, MD   5 mg at 10/04/15 0949  . ondansetron (ZOFRAN) injection 4 mg  4 mg Intravenous Q6H PRN Houston SirenPeter Le, MD      . ondansetron [facility](ZOFRAN) tablet 4 mg  4 mg Oral Q6H PRN Nadara MustardMarcus Duda V, MD       Or  . ondansetron [facility](ZOFRAN) injection 4 mg  4 mg Intravenous Q6H PRN Nadara MustardMarcus Duda V, MD       Do not use this list as official medication orders. Please verify with discharge summary.  Discharge Medications:   Medication List    ASK your doctor about these medications        amLODipine 10 MG tablet  Commonly known as:  NORVASC  TAKE 1 TABLET EVERY DAY     aspirin EC 325 MG tablet  Take 1 tablet (325 mg total) by mouth daily.     atenolol 50 MG tablet  Commonly known as:  TENORMIN  Take 1 tablet (50 mg total) by mouth daily.     benazepril 20 MG tablet  Commonly known as:  LOTENSIN  TAKE 1 TABLET EVERY DAY     ferrous sulfate 325 (65 FE) MG tablet  Take 325 mg by mouth daily with breakfast.     furosemide 20 MG tablet  Commonly known as:  LASIX  Take  1-2 tablets once a day as needed for leg swelling     glipiZIDE 10 MG tablet  Commonly known as:  GLUCOTROL  TAKE 1 TABLET TWICE DAILY BEFORE A MEAL     glucose blood test strip  Commonly known as:  ACCU-CHEK AVIVA  Use to monitor FSBS 1x  daily. Dx: E11.9     imipramine 50 MG tablet  Commonly known as:  TOFRANIL  Take 1 tablet (50 mg total) by mouth 3 (three) times daily.     JANUVIA 25 MG tablet  Generic drug:  sitaGLIPtin  TAKE ONE TABLET BY MOUTH ONCE DAILY     lovastatin 10 MG tablet  Commonly known as:  MEVACOR  Take 1 tablet (10 mg total) by mouth at bedtime.     metFORMIN 1000 MG tablet  Commonly known as:  GLUCOPHAGE  TAKE 1 TABLET TWICE DAILY WITH A MEAL     oxyCODONE 5 MG immediate release tablet  Commonly known as:  Oxy IR/ROXICODONE  Take 1-3 tablets (5-15 mg total) by mouth every 4 (four) hours as needed.     vancomycin 1 GM/200ML Soln  Commonly known as:  VANCOCIN  Inject 200 mLs (1,000 mg total) into the vein every 12 (twelve) hours.  Ask about: Should I take this medication?        Relevant Imaging Results:  Relevant Lab Results:  Recent Labs    Additional Information Resident of the [facility]  Lovette Cliche T, [place]

## 2015-10-04 NOTE — Progress Notes (Signed)
Fl2 completed and in EPIC for MD signature. Plan return to [facility] when medically stable per MD.  Lupita Leashonna T. Donnie Panik, LCSW [phone]   (weekend coverage)

## 2015-10-04 NOTE — Progress Notes (Signed)
Patient refused labs this morning.  Notified MD on-call.  Will continue to monitor. 

## 2015-10-05 LAB — COMPREHENSIVE METABOLIC PANEL
ALBUMIN: 1.6 g/dL — AB (ref 3.5–5.0)
ALT: 12 U/L — ABNORMAL LOW (ref 14–54)
ANION GAP: 12 (ref 5–15)
AST: 20 U/L (ref 15–41)
Alkaline Phosphatase: 91 U/L (ref 38–126)
BUN: 19 mg/dL (ref 6–20)
CO2: 21 mmol/L — AB (ref 22–32)
Calcium: 8 mg/dL — ABNORMAL LOW (ref 8.9–10.3)
Chloride: 104 mmol/L (ref 101–111)
Creatinine, Ser: 1.04 mg/dL — ABNORMAL HIGH (ref 0.44–1.00)
GFR calc Af Amer: >60 mL/min (ref >60)
GFR calc non Af Amer: 52 mL/min — ABNORMAL LOW (ref >60)
GLUCOSE: 164 mg/dL — AB (ref 65–99)
POTASSIUM: 3.4 mmol/L — AB (ref 3.5–5.1)
SODIUM: 137 mmol/L (ref 135–145)
Total Bilirubin: 0.4 mg/dL (ref 0.3–1.2)
Total Protein: 4.8 g/dL — ABNORMAL LOW (ref 6.5–8.1)

## 2015-10-05 LAB — CBC
HCT: 27.3 % — ABNORMAL LOW (ref 36.0–46.0)
HEMOGLOBIN: 8.5 g/dL — AB (ref 12.0–15.0)
MCH: 24.2 pg — ABNORMAL LOW (ref 26.0–34.0)
MCHC: 31.1 g/dL (ref 30.0–36.0)
MCV: 77.8 fL — ABNORMAL LOW (ref 78.0–100.0)
Platelets: 304 10*3/uL (ref 150–400)
RBC: 3.51 MIL/uL — ABNORMAL LOW (ref 3.87–5.11)
RDW: 19 % — AB (ref 11.5–15.5)
WBC: 14.6 10*3/uL — ABNORMAL HIGH (ref 4.0–10.5)

## 2015-10-05 LAB — CK: CK TOTAL: 15 U/L — AB (ref 38–234)

## 2015-10-05 LAB — GLUCOSE, CAPILLARY
GLUCOSE-CAPILLARY: 122 mg/dL — AB (ref 65–99)
Glucose-Capillary: 134 mg/dL — ABNORMAL HIGH (ref 65–99)
Glucose-Capillary: 142 mg/dL — ABNORMAL HIGH (ref 65–99)
Glucose-Capillary: 114 mg/dL — ABNORMAL HIGH (ref 65–99)

## 2015-10-05 LAB — PHOSPHORUS: PHOSPHORUS: 3.4 mg/dL (ref 2.5–4.6)

## 2015-10-05 LAB — PROTIME-INR
INR: 1.68 — ABNORMAL HIGH (ref 0.00–1.49)
Prothrombin Time: 19.8 seconds — ABNORMAL HIGH (ref 11.6–15.2)

## 2015-10-05 NOTE — Progress Notes (Signed)
Patient has refused all her night time medications. MD aware. RN will continue to monitor patient.   Veatrice KellsMahmoud,La Dibella I, RN

## 2015-10-05 NOTE — Progress Notes (Signed)
Pharmacy Antibiotic Time-Out Note  Lindsay Cobb is a 74 y.o. year-old female admitted on 09/21/2015.  The patient is currently on daptomycin for MRSA bacteremia.  Assessment/Plan: Lindsay Cobb was on Vancomycin and Zosyn - Zosyn was stopped given culture results.  Vancomycin was changed to daptomycin due to rising creatinine which is now improving.  She has a baseline CK of 21 - the next check is planned for 10/29.  Also, pravastatin was stopped to avoid overlapping toxicities.  WBC remains elevated and blood cx remain positive.  ID recommending checking TEE to r/o endocarditis.  Dosing was discussed with Dr. Daiva EvesVan Dam - due to persistent positive cultures, will go ahead and adjust the Daptomycin dose to 8 mg/kg (~900 mg) every 24 hours  Will need PICC placed once blood cultures have been clear for 72 hours.   Scr continues to be within normal limits. CK is 15 today. Will repeat next week.  No fevers noted, wbc 14.6 (down)  Antimicrobial allergies: none  Antimicrobials: Zosyn  10/16 >> 10/20 Vancomycin  10/5 >> 10/19 (started on previous admission and [facility]) Daptomycin 10/20>>  Microbiology Results: 10/16: blood x2 > MRSA 10/21: blood x 2 > neg F 10/22: Blood x 2 > 1/2 MRSA 10/24: urine multi sp, recollect 10/24: blood x 2 > 1/2 MRSA 10/26: blood x 2> ngtd  Plan: Continue Dapto 900mg  IV q24 hours Plan for next CK on Saturday   Recent Labs Lab 09/29/15 0503 09/30/15 0610 10/01/15 0454 10/05/15 0944  WBC 22.9* 22.2* 23.0* 14.6*     Recent Labs Lab 09/30/15 0610 10/01/15 0454 10/02/15 0416 10/03/15 0605 10/05/15 0944  CREATININE 1.87* 1.54* 1.24* 1.14* 1.04*   Estimated Creatinine Clearance: 61.1 mL/min (by C-G formula based on Cr of 1.04). Tmax/24h 98.2   Thank you for allowing pharmacy to be a part of this patient's care.  Sheppard CoilFrank Wilson PharmD., BCPS Clinical Pharmacist Pager [phone] 10/05/2015 2:36 PM

## 2015-10-05 NOTE — Progress Notes (Signed)
[facility] PROGRESS NOTE  Lindsay Cobb QJJ:[id number] DOB: June 13, 1941 DOA: 09/21/2015 PCP: [name], MD  brief narrative  7 ? Recent d/c from [facility] on 10/11 after undergoing incision and drainage of left knee by Dr. [name].  Wound cultures at that time indicated MRSA. She was discharged with a PICC line on vancomycin to skilled nursing facility. She'll return to the hospital with fevers and questionable pneumonia.  She was continued on vancomycin and started on Zosyn.  Blood cultures returned positive for Staphylococcus aureus.  Discussed with infectious disease who recommended transfer to [name] for orthopedic evaluation of left knee since it is felt that this is the likely source of her bacteremia. PICC line removed and echocardiogram done showing EF of 65-70% without wall motion abnormality or vegetation. Vancomycin was discontinued in favor of daptomycin due to rising creatinine.  After discussion with ID and ortho ( Dr [name]) pt transferred to [facility] for further evaluation.   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 SHe has failed to clear bacteremia and will need neg bc x2 prior to d/c with indwelling PICC   Assessment/Plan:  Sepsis with Staph aureus bacteremia/ wound dehiscence with extensive left knee osteomyelitis Possible source is infected left knee On IV daptomycin.  Dr [name] and Dr [name] following .  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 continue abx for now.  Repeat Blood cultures 1/2 from 10/22 growing MRSA  BC from 10/24 failing to clear MRSA Await final neg BC x2 prior to placing PICC line:D/w Dr. [name]--needs to clear bacteremia prior to d/c with PICC for 6 wk abx TEE performed 10/27 didn't show any Vegetation  S/p left AKA on 10/23  pain control with low-dose prn dilaudid.  PT eval recommend skilled nursing facility. returning to [facility] center on d/c  Ongoing sepsis with acute  encephalopathy noted 10/24 Patient less confused.  No new issues Felt to metabolic encephalopathy 2/2 to hyperammonemia and medications -Added Lactulose 10/25 and would continue on going for 1-2 loose stools a day  HCAP Afebrile. sats stable. Not in distress. continue daptomycin.  Acute kidney injury Renal function much improved Creat 2.2->1.87-->1.5-->1.14.  Patient's basleine 0.8 suspect prerenal etiology. off vanco and ACEi.  Received a dose of IV lasix on 10/21 for volum overload. 2d echo with normal EF.  FeNa= 0.1 indicative pre-renal cause UA bland with mild leuks  renal ultrasound unremarkeable  Strict i/o Nephrology consulted appreciated Resumed lasix 40 po daily as slight swelling in feet/arms  Hypotension discontinued BP meds.  monitor with IV fluids.   Hyperkalemia  pt was receiving scheduled k supplements .  dc'ed and ordered kayexalate. resolved  Hyponatremia Stable in 134 range Normal for this patient between 134-140 Rpt am labs  Protein calorie malnutrition Added supplements.    Code Status: full  Family Communication:  Sister at bedside updated Disposition Plan: Continue inpatient monitoring until sepsis and mental status resolved. Discharged to skilled nursing facility once stable.  Consultants:  Dr [name]   Dr [name]  Antibiotics:  IV vancomycin 10/11-10/18 ( was on 8 weeks IV vancomycin since hospital discharge on 10/11  IV Zosyn 10/17-10/20  Cefazolin 10/21-10/22  IV daptomycin 10/20--   HPI/Subjective:  Well  Family members at the bedside.  tol diet No other concerns alert  Objective: Filed Vitals:   10/05/15 0845  BP: 147/75  Pulse: 87  Temp: 98.2 F (36.8 C)  Resp: 16  Intake/Output Summary (Last 24 hours) at 10/05/15 1717 Last data filed at 10/05/15 0855  Gross per 24 hour  Intake    360 ml  Output   1053 ml  Net   -693 ml   Filed Weights   09/29/15 2039 10/02/15 2058 10/04/15 2006  Weight: 113 kg (249 lb  1.9 oz) 114 kg (251 lb 5.2 oz) 111.7 kg (246 lb 4.1 oz)    Exam:   General: Elderly female lying in bed  HEENT: moist mucosa, supple neck   Chest: clear b/l  CVS: NS1&S2  GI: soft, ND, NT, bowel sounds present, Foley placed on 10/23  Musculoskeletal: left AKA site bandage appears clean.    Data Reviewed: Basic Metabolic Panel:  Recent Labs Lab 09/30/15 0610 10/01/15 0454 10/02/15 0416 10/03/15 0605 10/05/15 0944  NA 130* 131* 133* 134* 137  K 4.5 3.7 4.4 3.3* 3.4*  CL 105 105 108 107 104  CO2 17* 19* 16* 21* 21*  GLUCOSE 199* 170* 153* 158* 164*  BUN 30* 31* 29* 24* 19  CREATININE 1.87* 1.54* 1.24* 1.14* 1.04*  CALCIUM 7.1* 7.0* 7.2* 7.4* 8.0*  PHOS  --   --  2.4* 2.2* 3.4   Liver Function Tests:  Recent Labs Lab 10/01/15 0454 10/02/15 0416 10/03/15 0605 10/05/15 0944  AST 21  --   --  20  ALT 13*  --   --  12*  ALKPHOS 81  --   --  91  BILITOT 0.5  --   --  0.4  PROT 3.9*  --   --  4.8*  ALBUMIN 1.4* 1.4* 1.5* 1.6*   No results for input(s): LIPASE, AMYLASE in the last 168 hours.  Recent Labs Lab 09/30/15 0950  AMMONIA 53*   CBC:  Recent Labs Lab 09/29/15 0503 09/30/15 0610 10/01/15 0454 10/05/15 0944  WBC 22.9* 22.2* 23.0* 14.6*  NEUTROABS  --   --  13.7*  --   HGB 9.6* 9.0* 8.7* 8.5*  HCT 30.4* 28.2* 27.1* 27.3*  MCV 78.4 77.3* 78.3 77.8*  PLT 435* 374 364 304   Cardiac Enzymes:  Recent Labs Lab 10/05/15 0944  CKTOTAL 15*   BNP (last 3 results)  Recent Labs  09/26/15 1414  BNP 48.0    ProBNP (last 3 results) No results for input(s): PROBNP in the last 8760 hours.  CBG:  Recent Labs Lab 10/04/15 1133 10/04/15 1700 10/04/15 2006 10/05/15 0733 10/05/15 1128  GLUCAP 151* 144* 147* 134* 142*    Recent Results (from the past 240 hour(s))  Culture, blood (routine x 2)     Status: None   Collection Time: 09/26/15  1:53 PM  Result Value Ref Range Status   Specimen Description BLOOD PICC LINE DRAWN BY RN  Final    Special Requests BOTTLES DRAWN AEROBIC AND ANAEROBIC 6CC EACH  Final   Culture NO GROWTH 5 DAYS  Final   Report Status 10/01/2015 FINAL  Final  Culture, blood (routine x 2)     Status: None   Collection Time: 09/26/15  1:54 PM  Result Value Ref Range Status   Specimen Description BLOOD LEFT ANTECUBITAL  Final   Special Requests BOTTLES DRAWN AEROBIC AND ANAEROBIC [facility]6CC EACH  Final   Culture NO GROWTH 5 DAYS  Final   Report Status 10/01/2015 FINAL  Final  Culture, blood (routine x 2)     Status: None   Collection Time: 09/27/15  6:18 PM  Result Value Ref Range Status   Specimen Description  BLOOD RIGHT ANTECUBITAL  Final   Special Requests BOTTLES DRAWN AEROBIC ONLY 9CC  Final   Culture NO GROWTH 5 DAYS  Final   Report Status 10/02/2015 FINAL  Final  Culture, blood (routine x 2)     Status: None   Collection Time: 09/27/15  6:25 PM  Result Value Ref Range Status   Specimen Description BLOOD LEFT ANTECUBITAL  Final   Special Requests IN PEDIATRIC BOTTLE 3CC  Final   Culture  Setup Time   Final    GRAM POSITIVE COCCI IN CLUSTERS IN PEDIATRIC BOTTLE CRITICAL RESULT CALLED TO, READ BACK BY AND VERIFIED WITH: Bonnee Quin [id number] WILDERK    Culture   Final    METHICILLIN RESISTANT STAPHYLOCOCCUS AUREUS SUSCEPTIBILITIES PERFORMED ON PREVIOUS CULTURE WITHIN THE LAST 5 DAYS.    Report Status 09/30/2015 FINAL  Final  Surgical pcr screen     Status: None   Collection Time: 09/27/15 11:53 PM  Result Value Ref Range Status   MRSA, PCR NEGATIVE NEGATIVE Final   Staphylococcus aureus NEGATIVE NEGATIVE Final    Comment:        The Xpert SA Assay (FDA approved for NASAL specimens in patients over 51 years of age), is one component of a comprehensive surveillance program.  Test performance has been validated by [facility] for patients greater than or equal to 62 year old. It is not intended to diagnose infection nor to guide or monitor treatment.   Culture, blood (routine x 2)      Status: None   Collection Time: 09/29/15  3:21 PM  Result Value Ref Range Status   Specimen Description BLOOD RIGHT HAND  Final   Special Requests BOTTLES DRAWN AEROBIC ONLY 5CC  Final   Culture NO GROWTH 5 DAYS  Final   Report Status 10/04/2015 FINAL  Final  Culture, blood (routine x 2)     Status: None   Collection Time: 09/29/15  4:20 PM  Result Value Ref Range Status   Specimen Description BLOOD RIGHT HAND  Final   Special Requests IN PEDIATRIC BOTTLE 3CC  Final   Culture  Setup Time   Final    GRAM POSITIVE COCCI IN CLUSTERS AEROBIC BOTTLE ONLY CRITICAL RESULT CALLED TO, READ BACK BY AND VERIFIED WITH: [facility] @0502  10/01/15 MKELLY    Culture METHICILLIN RESISTANT STAPHYLOCOCCUS AUREUS  Final   Report Status 10/03/2015 FINAL  Final   Organism ID, Bacteria METHICILLIN RESISTANT STAPHYLOCOCCUS AUREUS  Final      Susceptibility   Methicillin resistant staphylococcus aureus - MIC*    CIPROFLOXACIN >=8 RESISTANT Resistant     ERYTHROMYCIN >=8 RESISTANT Resistant     GENTAMICIN <=0.5 SENSITIVE Sensitive     OXACILLIN >=4 RESISTANT Resistant     TETRACYCLINE <=1 SENSITIVE Sensitive     VANCOMYCIN 1 SENSITIVE Sensitive     TRIMETH/SULFA >=320 RESISTANT Resistant     CLINDAMYCIN <=0.25 SENSITIVE Sensitive     RIFAMPIN <=0.5 SENSITIVE Sensitive     Inducible Clindamycin NEGATIVE Sensitive     * METHICILLIN RESISTANT STAPHYLOCOCCUS AUREUS  Urine culture     Status: None   Collection Time: 09/29/15  4:46 PM  Result Value Ref Range Status   Specimen Description URINE, CATHETERIZED  Final   Special Requests NONE  Final   Culture MULTIPLE SPECIES PRESENT, SUGGEST RECOLLECTION  Final   Report Status 10/01/2015 FINAL  Final  Culture, blood (routine x 2)     Status: None (Preliminary result)   Collection Time: 10/01/15  4:25 PM  Result Value Ref Range Status   Specimen Description BLOOD RIGHT ARM  Final   Special Requests BOTTLES DRAWN AEROBIC ONLY 5CC  Final   Culture NO GROWTH 4  DAYS  Final   Report Status PENDING  Incomplete  Culture, blood (routine x 2)     Status: None (Preliminary result)   Collection Time: 10/01/15  4:32 PM  Result Value Ref Range Status   Specimen Description BLOOD RIGHT HAND  Final   Special Requests BOTTLES DRAWN AEROBIC ONLY 3CC  Final   Culture NO GROWTH 4 DAYS  Final   Report Status PENDING  Incomplete     Studies: No results found.  Scheduled Meds: . atenolol  50 mg Oral Daily  . benazepril  20 mg Oral Daily  . collagenase   Topical Daily  . DAPTOmycin (CUBICIN)  IV  900 mg Intravenous Q24H  . feeding supplement (GLUCERNA SHAKE)  237 mL Oral BID BM  . feeding supplement (PRO-STAT SUGAR FREE 64)  30 mL Oral BID  . ferrous sulfate  325 mg Oral Q breakfast  . furosemide  40 mg Oral Daily  . heparin  5,000 Units Subcutaneous 3 times per day  . insulin aspart  0-5 Units Subcutaneous QHS  . insulin aspart  0-9 Units Subcutaneous TID WC  . lactulose  20 g Oral BID  . linagliptin  5 mg Oral Daily   Continuous Infusions: . sodium chloride 10 mL/hr at 10/02/15 1440      Time spent: 15 minutes  Pleas Koch, MD [facility] (P) [phone]

## 2015-10-06 DIAGNOSIS — R509 Fever, unspecified: Secondary | ICD-10-CM | POA: Insufficient documentation

## 2015-10-06 LAB — CULTURE, BLOOD (ROUTINE X 2)
CULTURE: NO GROWTH
Culture: NO GROWTH

## 2015-10-06 LAB — GLUCOSE, CAPILLARY
GLUCOSE-CAPILLARY: 106 mg/dL — AB (ref 65–99)
Glucose-Capillary: 115 mg/dL — ABNORMAL HIGH (ref 65–99)
Glucose-Capillary: 124 mg/dL — ABNORMAL HIGH (ref 65–99)
Glucose-Capillary: 95 mg/dL (ref 65–99)

## 2015-10-06 LAB — RENAL FUNCTION PANEL
ALBUMIN: 1.7 g/dL — AB (ref 3.5–5.0)
ANION GAP: 10 (ref 5–15)
BUN: 18 mg/dL (ref 6–20)
CALCIUM: 8.1 mg/dL — AB (ref 8.9–10.3)
CO2: 24 mmol/L (ref 22–32)
Chloride: 105 mmol/L (ref 101–111)
Creatinine, Ser: 1.09 mg/dL — ABNORMAL HIGH (ref 0.44–1.00)
GFR calc non Af Amer: 49 mL/min — ABNORMAL LOW (ref >60)
GFR, EST AFRICAN AMERICAN: 57 mL/min — AB (ref >60)
Glucose, Bld: 132 mg/dL — ABNORMAL HIGH (ref 65–99)
PHOSPHORUS: 3.5 mg/dL (ref 2.5–4.6)
Potassium: 3.3 mmol/L — ABNORMAL LOW (ref 3.5–5.1)
SODIUM: 139 mmol/L (ref 135–145)

## 2015-10-06 MED ORDER — SODIUM CHLORIDE 0.9 % IJ SOLN
10.0000 mL | INTRAMUSCULAR | Status: DC | PRN
  Administered 2015-10-07 – 2015-10-08 (×2): 10 mL
  Filled 2015-10-06 (×2): qty 40

## 2015-10-06 MED ORDER — SODIUM CHLORIDE 0.9 % IV SOLN: 900.0000 mg | INTRAVENOUS | Status: DC

## 2015-10-06 MED ORDER — LACTULOSE 10 GM/15ML PO SOLN: 20.0000 g | Freq: Two times a day (BID) | ORAL | Status: DC

## 2015-10-06 MED ORDER — FUROSEMIDE 40 MG PO TABS: 40.0000 mg | ORAL_TABLET | Freq: Every day | ORAL | Status: DC

## 2015-10-06 NOTE — Progress Notes (Signed)
[place] for Infectious Disease    Subjective: No new complaints  Antibiotics:  Anti-infectives    Start     Dose/Rate Route Frequency Ordered Stop   10/06/15 0000  DAPTOmycin 900 mg in sodium chloride 0.9 % 100 mL     900 mg 236 mL/hr over 30 Minutes Intravenous Every 24 hours 10/06/15 0850 11/12/15 2359   10/03/15 1800  DAPTOmycin (CUBICIN) 900 mg in sodium chloride 0.9 % IVPB     900 mg 236 mL/hr over 30 Minutes Intravenous Every 24 hours 10/02/15 1256     09/26/15 1400  ceFAZolin (ANCEF) IVPB 2 g/50 mL premix  Status:  Discontinued     2 g 100 mL/hr over 30 Minutes Intravenous 3 times per day 09/26/15 1306 09/27/15 0851   09/25/15 1800  DAPTOmycin (CUBICIN) 700 mg in sodium chloride 0.9 % IVPB     700 mg 228 mL/hr over 30 Minutes Intravenous Every 24 hours 09/25/15 1650 10/02/15 1750   09/23/15 1800  vancomycin (VANCOCIN) 1,750 mg in sodium chloride 0.9 % 500 mL IVPB  Status:  Discontinued     1,750 mg 250 mL/hr over 120 Minutes Intravenous Every 24 hours 09/23/15 1327 09/25/15 1644   09/22/15 1200  vancomycin (VANCOCIN) IVPB 1000 mg/200 mL premix  Status:  Discontinued     1,000 mg 200 mL/hr over 60 Minutes Intravenous Every 12 hours 09/22/15 0945 09/23/15 1130   09/22/15 0800  piperacillin-tazobactam (ZOSYN) IVPB 3.375 g  Status:  Discontinued     3.375 g 12.5 mL/hr over 240 Minutes Intravenous Every 8 hours 09/22/15 0159 09/25/15 1644   09/22/15 0000  vancomycin (VANCOCIN) IVPB 1000 mg/200 mL premix     1,000 mg 200 mL/hr over 60 Minutes Intravenous Every 1 hr x 2 09/21/15 2336 09/22/15 0159   09/21/15 2330  piperacillin-tazobactam (ZOSYN) IVPB 3.375 g  Status:  Discontinued     3.375 g 100 mL/hr over 30 Minutes Intravenous 3 times per day 09/21/15 2321 09/22/15 0157      Medications: Scheduled Meds: . atenolol  50 mg Oral Daily  . benazepril  20 mg Oral Daily  . collagenase   Topical Daily  . DAPTOmycin (CUBICIN)  IV  900 mg  Intravenous Q24H  . feeding supplement (GLUCERNA SHAKE)  237 mL Oral BID BM  . feeding supplement (PRO-STAT SUGAR FREE 64)  30 mL Oral BID  . ferrous sulfate  325 mg Oral Q breakfast  . furosemide  40 mg Oral Daily  . heparin  5,000 Units Subcutaneous 3 times per day  . insulin aspart  0-5 Units Subcutaneous QHS  . insulin aspart  0-9 Units Subcutaneous TID WC  . lactulose  20 g Oral BID  . linagliptin  5 mg Oral Daily   Continuous Infusions: . sodium chloride 10 mL/hr at 10/02/15 1440   PRN Meds:.acetaminophen **OR** acetaminophen, albuterol, HYDROmorphone (DILAUDID) injection, [DISCONTINUED] ondansetron **OR** ondansetron (ZOFRAN) IV, ondansetron **OR** ondansetron (ZOFRAN) IV    Objective: Weight change:   Intake/Output Summary (Last 24 hours) at 10/06/15 1345 Last data filed at 10/06/15 0600  Gross per 24 hour  Intake      0 ml  Output    200 ml  Net   -200 ml   Blood pressure 140/79, pulse 79, temperature 97.8 F (36.6 C), temperature source Oral, resp. rate 17, height _0  (1.676 m), weight 246 lb 4.1 oz (111.7 kg), SpO2 99 %. Temp:  [  97.4 F (36.3 C)-98.5 F (36.9 C)] 97.8 F (36.6 C) (10/31 1225) Pulse Rate:  [70-90] 79 (10/31 1225) Resp:  [16-18] 17 (10/31 1225) BP: (140-166)/(61-79) 140/79 mmHg (10/31 1225) SpO2:  [98 %-100 %] 99 % (10/31 1225)  Physical Exam: General: alert, awake oriented  She is working with PT She has decubitus ulcer with dressing visible posteriorly Neuro: nonfocal, strength and sensation intact  CBC:  CBC Latest Ref Rng 10/05/2015 10/01/2015 09/30/2015  WBC 4.0 - 10.5 K/uL 14.6(H) 23.0(H) 22.2(H)  Hemoglobin 12.0 - 15.0 g/dL 8.5(L) 8.7(L) 9.0(L)  Hematocrit 36.0 - 46.0 % 27.3(L) 27.1(L) 28.2(L)  Platelets 150 - 400 K/uL 304 364 374      BMET  Recent Labs  10/05/15 0944 10/06/15 0640  NA 137 139  K 3.4* 3.3*  CL 104 105  CO2 21* 24  GLUCOSE 164* 132*  BUN 19 18  CREATININE 1.04* 1.09*  CALCIUM 8.0* 8.1*      Liver Panel   Recent Labs  10/05/15 0944 10/06/15 0640  PROT 4.8*  --   ALBUMIN 1.6* 1.7*  AST 20  --   ALT 12*  --   ALKPHOS 91  --   BILITOT 0.4  --        Sedimentation Rate No results for input(s): ESRSEDRATE in the last 72 hours. C-Reactive Protein No results for input(s): CRP in the last 72 hours.  Micro Results: Recent Results (from the past 720 hour(s))  Culture, blood (routine x 2)     Status: None   Collection Time: 09/21/15  8:16 PM  Result Value Ref Range Status   Specimen Description BLOOD A-LINE DRAW DRAWN BY RN  Final   Special Requests BOTTLES DRAWN AEROBIC AND ANAEROBIC 6CC  Final   Culture  Setup Time   Final    GRAM POSITIVE COCCI IN CLUSTERS RECOVERED FROM THE ANAEROBIC BOTTLE Gram Stain Report Called to,Read Back By and Verified With: COVINGTON,L. AT 7035 ON 09/24/2015 BY BAUGHAM,M. Performed at [place] Performed at [facility]    Report Status 09/27/2015 FINAL  Final   Organism ID, Bacteria METHICILLIN RESISTANT STAPHYLOCOCCUS AUREUS  Final      Susceptibility   Methicillin resistant staphylococcus aureus - MIC*    CIPROFLOXACIN >=8 RESISTANT Resistant     ERYTHROMYCIN >=8 RESISTANT Resistant     GENTAMICIN <=0.5 SENSITIVE Sensitive     OXACILLIN >=4 RESISTANT Resistant     TETRACYCLINE <=1 SENSITIVE Sensitive     VANCOMYCIN 1 SENSITIVE Sensitive     TRIMETH/SULFA >=320 RESISTANT Resistant     CLINDAMYCIN <=0.25 SENSITIVE Sensitive     RIFAMPIN <=0.5 SENSITIVE Sensitive     Inducible Clindamycin NEGATIVE Sensitive     * METHICILLIN RESISTANT STAPHYLOCOCCUS AUREUS  Culture, blood (routine x 2)     Status: None   Collection Time: 09/21/15 11:30 PM  Result Value Ref Range Status   Specimen Description BLOOD RIGHT ANTECUBITAL  Final   Special Requests BOTTLES DRAWN AEROBIC AND ANAEROBIC 6CC  Final   Culture  Setup Time   Final    GRAM POSITIVE  COCCI IN CLUSTERS RECOVERED FROM THE AEROBIC BOTTLE Gram Stain Report Called to,Read Back By and Verified With: DAVIS,L. AT 1822 ON 09/25/2015 BY BAUGHAM,M. Performed at [place] Performed at [facility]    Report Status  10/01/2015 FINAL  Final   Organism ID, Bacteria METHICILLIN RESISTANT STAPHYLOCOCCUS AUREUS  Final      Susceptibility   Methicillin resistant staphylococcus aureus - MIC*    CIPROFLOXACIN >=8 RESISTANT Resistant     ERYTHROMYCIN >=8 RESISTANT Resistant     GENTAMICIN <=0.5 SENSITIVE Sensitive     OXACILLIN >=4 RESISTANT Resistant     TETRACYCLINE <=1 SENSITIVE Sensitive     VANCOMYCIN <=0.5 SENSITIVE Sensitive     TRIMETH/SULFA 160 RESISTANT Resistant     CLINDAMYCIN <=0.25 SENSITIVE Sensitive     RIFAMPIN <=0.5 SENSITIVE Sensitive     Inducible Clindamycin NEGATIVE Sensitive     * METHICILLIN RESISTANT STAPHYLOCOCCUS AUREUS  Urine culture     Status: None   Collection Time: 09/21/15 11:53 PM  Result Value Ref Range Status   Specimen Description URINE, CLEAN CATCH  Final   Special Requests NONE  Final   Culture   Final    MULTIPLE SPECIES PRESENT, SUGGEST RECOLLECTION Performed at [facility]    Report Status 09/23/2015 FINAL  Final  MRSA PCR Screening     Status: None   Collection Time: 09/22/15  1:52 PM  Result Value Ref Range Status   MRSA by PCR NEGATIVE NEGATIVE Final    Comment:        The GeneXpert MRSA Assay (FDA approved for NASAL specimens only), is one component of a comprehensive MRSA colonization surveillance program. It is not intended to diagnose MRSA infection nor to guide or monitor treatment for MRSA infections.   Culture, blood (routine x 2)     Status: None   Collection Time: 09/26/15  1:53 PM  Result Value Ref Range Status   Specimen Description BLOOD PICC LINE DRAWN BY RN  Final   Special Requests BOTTLES DRAWN AEROBIC AND ANAEROBIC  [place]  Final   Culture NO GROWTH 5 DAYS  Final   Report Status 10/01/2015 FINAL  Final  Culture, blood (routine x 2)     Status: None   Collection Time: 09/26/15  1:54 PM  Result Value Ref Range Status   Specimen Description BLOOD LEFT ANTECUBITAL  Final   Special Requests BOTTLES DRAWN AEROBIC AND ANAEROBIC [place]  Final   Culture NO GROWTH 5 DAYS  Final   Report Status 10/01/2015 FINAL  Final  Culture, blood (routine x 2)     Status: None   Collection Time: 09/27/15  6:18 PM  Result Value Ref Range Status   Specimen Description BLOOD RIGHT ANTECUBITAL  Final   Special Requests BOTTLES DRAWN AEROBIC ONLY [place]  Final   Culture NO GROWTH 5 DAYS  Final   Report Status 10/02/2015 FINAL  Final  Culture, blood (routine x 2)     Status: None   Collection Time: 09/27/15  6:25 PM  Result Value Ref Range Status   Specimen Description BLOOD LEFT ANTECUBITAL  Final   Special Requests IN PEDIATRIC BOTTLE 3CC  Final   Culture  Setup Time   Final    GRAM POSITIVE COCCI IN CLUSTERS IN PEDIATRIC BOTTLE CRITICAL RESULT CALLED TO, READ BACK BY AND VERIFIED WITH: Carole Civil [id number] [place]    Culture   Final    METHICILLIN RESISTANT STAPHYLOCOCCUS AUREUS SUSCEPTIBILITIES PERFORMED ON PREVIOUS CULTURE WITHIN THE LAST 5 DAYS.    Report Status 09/30/2015 FINAL  Final  Surgical pcr screen     Status: None   Collection Time: 09/27/15 11:53 PM  Result Value Ref Range Status   MRSA,  PCR NEGATIVE NEGATIVE Final   Staphylococcus aureus NEGATIVE NEGATIVE Final    Comment:        The Xpert SA Assay (FDA approved for NASAL specimens in patients over 62 years of age), is one component of a comprehensive surveillance program.  Test performance has been validated by [facility] for patients greater than or equal to 80 year old. It is not intended to diagnose infection nor to guide or monitor treatment.   Culture, blood (routine x 2)     Status: None   Collection Time: 09/29/15  3:21 PM   Result Value Ref Range Status   Specimen Description BLOOD RIGHT HAND  Final   Special Requests BOTTLES DRAWN AEROBIC ONLY 5CC  Final   Culture NO GROWTH 5 DAYS  Final   Report Status 10/04/2015 FINAL  Final  Culture, blood (routine x 2)     Status: None   Collection Time: 09/29/15  4:20 PM  Result Value Ref Range Status   Specimen Description BLOOD RIGHT HAND  Final   Special Requests IN PEDIATRIC BOTTLE 3CC  Final   Culture  Setup Time   Final    GRAM POSITIVE COCCI IN CLUSTERS AEROBIC BOTTLE ONLY CRITICAL RESULT CALLED TO, READ BACK BY AND VERIFIED WITH: EMMANUAL _0  10/01/15 MKELLY    Culture METHICILLIN RESISTANT STAPHYLOCOCCUS AUREUS  Final   Report Status 10/03/2015 FINAL  Final   Organism ID, Bacteria METHICILLIN RESISTANT STAPHYLOCOCCUS AUREUS  Final      Susceptibility   Methicillin resistant staphylococcus aureus - MIC*    CIPROFLOXACIN >=8 RESISTANT Resistant     ERYTHROMYCIN >=8 RESISTANT Resistant     GENTAMICIN <=0.5 SENSITIVE Sensitive     OXACILLIN >=4 RESISTANT Resistant     TETRACYCLINE <=1 SENSITIVE Sensitive     VANCOMYCIN 1 SENSITIVE Sensitive     TRIMETH/SULFA >=320 RESISTANT Resistant     CLINDAMYCIN <=0.25 SENSITIVE Sensitive     RIFAMPIN <=0.5 SENSITIVE Sensitive     Inducible Clindamycin NEGATIVE Sensitive     * METHICILLIN RESISTANT STAPHYLOCOCCUS AUREUS  Urine culture     Status: None   Collection Time: 09/29/15  4:46 PM  Result Value Ref Range Status   Specimen Description URINE, CATHETERIZED  Final   Special Requests NONE  Final   Culture MULTIPLE SPECIES PRESENT, SUGGEST RECOLLECTION  Final   Report Status 10/01/2015 FINAL  Final  Culture, blood (routine x 2)     Status: None   Collection Time: 10/01/15  4:25 PM  Result Value Ref Range Status   Specimen Description BLOOD RIGHT ARM  Final   Special Requests BOTTLES DRAWN AEROBIC ONLY 5CC  Final   Culture NO GROWTH 5 DAYS  Final   Report Status 10/06/2015 FINAL  Final  Culture, blood  (routine x 2)     Status: None   Collection Time: 10/01/15  4:32 PM  Result Value Ref Range Status   Specimen Description BLOOD RIGHT HAND  Final   Special Requests BOTTLES DRAWN AEROBIC ONLY 3CC  Final   Culture NO GROWTH 5 DAYS  Final   Report Status 10/06/2015 FINAL  Final    Studies/Results: No results found.    Assessment/Plan:  INTERVAL HISTORY:  09/28/2015: Patient is status post above-the-knee imitation left 09/29/15: 10//22/16 CULTURES STILL POSITIVE MRSA 10/01/15: 09/29/15 cultures [facility] in 1/2 bottles --> MRSA  10/27-10/31/16: TEE negative for vegetations and repeat cultures are negative final from blood   Principal Problem:   HCAP (healthcare-associated pneumonia) Active Problems:  Type II diabetes mellitus (HCC)   Essential hypertension, benign   Obesity   Microcytic anemia   Wound dehiscence, surgical   Hypokalemia   Decubitus ulcer of left buttock, unstageable (HCC)   Absolute anemia   Staphylococcus aureus bacteremia   Acute renal failure (HCC)   Atelectasis   Bacterial infection of knee joint ([place])   Staphylococcus aureus bacteremia with sepsis ([place])   MRSA bacteremia   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 Specific aspects of clinical management are critical to optimizing the outcome of patients with SAB. Therefore, the [facility] Antimicrobial Management Team [facility]) has initiated an intervention aimed at improving the management of SAB at [facility]. To do so, Infectious Diseases physicians are providing an evidence-based consult for the management of all patients with SAB.     Yes No Comments  Perform follow-up blood cultures (even if the patient is afebrile) to ensure clearance of bacteremia _0  _1  Repeat blood cultures 10/01/15 Negative at 5 days FINAL  Remove vascular catheter and obtain follow-up blood cultures after the removal of the catheter _2  _3  fine to place PICC today  Perform echocardiography to evaluate for endocarditis (transthoracic ECHO is 40-50% sensitive, TEE is > 90% sensitive) _4  _5  TEE negative  Consult electrophysiologist to evaluate implanted cardiac device (pacemaker, ICD) _6  _7  NA   Ensure source control _8  _9  Have all abscesses been drained effectively? Have deep seeded infections (septic joints or osteomyelitis) had appropriate surgical debridement?  YES she is sp BKA  Investigate for "metastatic" sites of infection _10  _11  Does the patient have ANY symptom or physical exam finding that would suggest a deeper infection (back or neck pain that may be suggestive of vertebral osteomyelitis or epidural abscess, muscle pain that could be a symptom of pyomyositis)?  Keep in mind that for deep seeded infections MRI imaging with contrast is preferred rather than other often insensitive tests such as plain x-rays, especially early in a patient's presentation. Not clear any other sites at this time  She had SOME back pain but it is not really prominent at present   Change antibiotic therapy to daptomycin at high dose _12  _13  Beta-lactam antibiotics are preferred for MSSA due to higher cure rates.  If on  Vancomycin, goal trough should be 15 - 20 mcg/mL  Estimated duration of IV antibiotic therapy:  8 weeks,  _14  _15  Consult case management for probably prolonged outpatient IV antibiotic therapy   Screening:HIV negative,       Diagnosis: MRSA bacteremia and osteo left femur, tibia  Culture Result: MRSA from blood and OR  No Known Allergies  Discharge antibiotics: Per pharmacy protocol Cubicin Duration: 8 weeks End  Date:  December 20th, 2016  [facility] Per Protocol: Labs weekly while on IV antibiotics: _x_ CBC with differential _x_ BMP _x_ CRP _x_ ESR _x_ CPK  Fax weekly labs to [phone]  Clinic Follow Up Appt:  Next 5-6 weeks     LOS: 14 days   Alcide Evener 10/06/2015, 1:45 PM

## 2015-10-06 NOTE — Discharge Summary (Addendum)
Physician Discharge Summary  Lindsay Cobb QZE:[id number] DOB: 04/07/1941 DOA: 09/21/2015  PCP: Vic Blackbird, MD  Admit date: 09/21/2015 Discharge date: 10/06/2015  Time spent: 45 minutes  Recommendations for Outpatient Follow-up:  1. Suggest chem-12 and cbc + diff on d/c after 5 days 2. started potassium for hypokalemia on 11/1-rechekc labs 3-4 days 3. Patient will  need consideration for prosthesis as per Dr Sharol Given as an outpatient and we will attempt to make an appointment to address this 4. Should complete Cubicin on 11/25/15, which would be a 8 week course 5. Please report CK, ESR, CRP, Chem-7, CBC plus differential to Dr. Lucianne Lei dam of infectious disease weekly at his office at 6. Patient will be discharged to the SNF for further consideration of rehabilitation and PICC line will be placed on discharge which should be Patent for duration of therapy  Discharge Diagnoses:  Principal Problem:   HCAP (healthcare-associated pneumonia) Active Problems:   Type II diabetes mellitus ([place])   Essential hypertension, benign   Obesity   Microcytic anemia   Wound dehiscence, surgical   Hypokalemia   Decubitus ulcer of left buttock, unstageable (HCC)   Absolute anemia   Staphylococcus aureus bacteremia   Acute renal failure (HCC)   Atelectasis   Bacterial infection of knee joint (HCC)   Staphylococcus aureus bacteremia with sepsis (HCC)   MRSA bacteremia   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  Filed Weights   09/29/15 2039 10/02/15 2058 10/04/15 2006  Weight: 113 kg (249 lb 1.9 oz) 114 kg (251 lb 5.2 oz) 111.7 kg (246 lb 4.1 oz)    History of present illness:  66 ? Recent d/c from [facility] on 10/11 after undergoing incision and drainage of left  knee by Dr. Erlinda Hong.  Wound cultures at that time indicated MRSA. She was discharged with a PICC line on vancomycin to skilled nursing facility. She'll return to the hospital with fevers and questionable pneumonia.  She was continued on vancomycin and started on Zosyn.  Blood cultures returned positive for Staphylococcus aureus.  Discussed with infectious disease who recommended transfer to [facility] for orthopedic evaluation of left knee since it is felt that this is the likely source of her bacteremia. PICC line removed and echocardiogram done showing EF of 65-70% without wall motion abnormality or vegetation. Vancomycin was discontinued in favor of daptomycin due to rising creatinine.  After discussion with ID and ortho ( Dr Sharol Given) pt transferred to [facility] for further evaluation.   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 SHe has failed to clear bacteremia and will need neg bc x2 prior to d/c with indwelling PICC   Hospital Course:  Sepsis with Staph aureus bacteremia/ wound dehiscence with extensive left knee osteomyelitis  infected left knee On IV daptomycin.  Dr Lucianne Lei dam and Dr Sharol Given following .  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 Repeat Blood cultures 1/2 from 10/22 growing MRSA  BC from 10/24 failing to clear MRSA Await final neg BC x2 prior to placing PICC line:D/w Dr. 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 PT eval recommend skilled nursing facility. returning to [facility] center on d/c  Ongoing sepsis with acute encephalopathy noted 10/24 Patient less confused. No new issues Felt to metabolic encephalopathy 2/2 to hyperammonemia and medications -Added Lactulose 30 mils twice a day 10/25 and would continue on going  for 1-2 loose stools a day Would continue this ongoing  HCAP Afebrile. sats stable. Not in distress. continue daptomycin.  Acute kidney injury Renal function much improved Creat 2.2->1.87-->1.5-->1.14. Patient's basleine 0.8 Discharge Creatinine 1.09 suspect prerenal etiology. off vanco and ACEi.  Received a dose of IV lasix on 10/21 for volum overload. 2d echo with normal EF.  FeNa= 0.1 indicative pre-renal cause UA bland with mild leuks  renal ultrasound unremarkeable  Strict i/o Nephrology consulted appreciated Resumed lasix 40 po daily as slight swelling in feet/arms  Hypotension Initially was discontinued off of blood pressure meds On discharge we did resume benazepril 20 daily, atenolol 50 daily and amlodipine 10 daily If labs show further volume depletion as an outpatient, would discontinue ACE inhibitor  monitor with IV fluids.   Hyperkalemia pt was receiving scheduled k supplements .  She was found to have recurrent hypo K and this was replaced and need sot be rechecked   Hyponatremia Stable in 134 range Normal for this patient between 134-140 Rpt am labs  Protein calorie malnutrition Added supplements.  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  Consultations:  Infectious disease, orthopedics  Discharge Exam: Filed Vitals:   10/06/15 0835  BP: 156/66  Pulse: 77  Temp: 98.1 F (36.7 C)  Resp: 18   Alert pleasant oriented no apparent distress  General: EOMI NCAT obese Cardiovascular: S1-S2 no murmur rub or gallop mild lower extremity edema chest clinically clear Respiratory: clear no added sound  Discharge Instructions   Discharge Instructions    AMB Referral to [place] [facility]    Complete by:  As directed   Reason for consult:  Post hospital follow up  transition likely from SNF to home is planned per family; multiple admissions/6 mons  Diagnoses of:  Kidney Failure  Expected date of contact:  1-3 days (reserved for hospital discharges)  Please assign social worker for post hospital follow up.  Patient is hopeful to discharge back to [facility] and then return home with family. Consent signed.  Recent Left AKA, and please have social worker alert assigned nurse for return home and needs at that point.  For questions, please contact: Natividad Brood, RN BSN [facility] Liaison  [phone] business mobile phone     Diet - low sodium heart healthy    Complete by:  As directed      Increase activity slowly    Complete by:  As directed           Current Discharge Medication List    START taking these medications   Details  DAPTOmycin 900 mg in sodium chloride 0.9 % 100 mL Inject 900 mg into the vein daily. Qty: 1 g, Refills: 0    lactulose (CHRONULAC) 10 GM/15ML solution Take 30 mLs (20 g total) by mouth 2 (two) times daily. Qty: 240 mL, Refills: 0    potassium chloride SA (K-DUR,KLOR-CON) 20 MEQ tablet Take 2 tablets (40  mEq total) by mouth 2 (two) times daily. Qty: 10 tablet, Refills: 0      CONTINUE these medications which have CHANGED   Details  furosemide (LASIX) 40 MG tablet Take 1 tablet (40 mg total) by mouth daily. Qty: 30 tablet, Refills: 0      CONTINUE these medications which have NOT CHANGED   Details  amLODipine (NORVASC) 10 MG tablet TAKE 1 TABLET EVERY DAY Qty: 90 tablet, Refills: 1    aspirin EC 325 MG tablet Take 1 tablet (325 mg total) by mouth daily. Qty: 84 tablet, Refills: 0    atenolol (TENORMIN) 50 MG tablet Take 1 tablet (50 mg total) by mouth daily. Qty: 90 tablet, Refills: 1    benazepril (LOTENSIN) 20 MG tablet TAKE 1 TABLET EVERY DAY Qty: 90 tablet, Refills: 1    ferrous sulfate 325 (65 FE) MG tablet Take 325 mg by mouth daily with breakfast.  Refills: 0    imipramine  (TOFRANIL) 50 MG tablet Take 1 tablet (50 mg total) by mouth 3 (three) times daily. Qty: 270 tablet, Refills: 1    JANUVIA 25 MG tablet TAKE ONE TABLET BY MOUTH ONCE DAILY Qty: 30 tablet, Refills: 3    lovastatin (MEVACOR) 10 MG tablet Take 1 tablet (10 mg total) by mouth at bedtime. Qty: 90 tablet, Refills: 2    metFORMIN (GLUCOPHAGE) 1000 MG tablet TAKE 1 TABLET TWICE DAILY WITH A MEAL Qty: 180 tablet, Refills: 1    oxyCODONE (OXY IR/ROXICODONE) 5 MG immediate release tablet Take 1-3 tablets (5-15 mg total) by mouth every 4 (four) hours as needed. Qty: 180 tablet, Refills: 0    glucose blood (ACCU-CHEK AVIVA) test strip Use to monitor FSBS 1x daily. Dx: E11.9 Qty: 50 each, Refills: 6      STOP taking these medications     glipiZIDE (GLUCOTROL) 10 MG tablet      vancomycin (VANCOCIN) 1 GM/200ML SOLN        No Known Allergies Follow-up Information    Follow up with DUDA,MARCUS V, MD In 2 weeks.   Specialty:  Orthopedic Surgery   Contact information:   [address] [place] [address] [phone]        The results of significant diagnostics from this hospitalization (including imaging, microbiology, ancillary and laboratory) are listed below for reference.    Significant Diagnostic Studies: US Renal  09/29/2015  CLINICAL DATA:  Acute renal injury. EXAM: RENAL / URINARY TRACT ULTRASOUND COMPLETE COMPARISON:  None. FINDINGS: Extremely limited exam due to body habitus. Right Kidney: Length: 9.8 cm.  No definite hydronephrosis. Left Kidney: Length: 9.5 cm.  No definite hydronephrosis. Bladder: Not visualized. IMPRESSION: Extremely limited examination due to body habitus. No definite hydronephrosis. Electronically Signed   By: Lovey Newcomer M.D.   On: 09/29/2015 18:49   Mr Knee Left  Wo Contrast  09/27/2015  CLINICAL DATA:  Status post partial left patellectomy 07/11/2015 due to a fracture. The patient's surgical wound subsequently became infected and dehisced.  Status post irrigation and debridement. Now with staphylococcal bacteremia. EXAM: MRI OF THE LEFT KNEE WITHOUT CONTRAST TECHNIQUE: Multiplanar, multisequence MR imaging of the knee was performed. No intravenous contrast was administered. COMPARISON:  Plain films left knee 07/04/2015. FINDINGS: Surgical wound is seen anteriorly off the inferior pole the patella. There is diffuse and intense subcutaneous edema about the knee. All imaged musculature demonstrates marked atrophy. MENISCI Medial meniscus:  Intact. Lateral meniscus: There is a horizontal tear in the posterior horn of the  lateral meniscus reaching the meniscal undersurface. The body of the lateral meniscus is extruded peripherally. LIGAMENTS Cruciates:  Intact. Collaterals:  Intact. CARTILAGE Patellofemoral: Extensive hyaline cartilage loss is present diffusely. Medial:  Thinned throughout. Lateral:  Thinned throughout. Joint: A small amount of joint fluid is present. Synovium about the joint appears markedly thickened. Intense edema is seen in Hoffa's fat throughout. Popliteal Fossa:  No Baker's cyst. Extensor Mechanism: The patient is status post inferior patellectomy and reconstruction of the patellar tendon. The extensor mechanism appears intact. Bones: Marrow edema is seen throughout the patella and in the medial and lateral femoral condyles and tibial plateaus. The appearance is highly suspicious for osteomyelitis. IMPRESSION: Intense subcutaneous edema and synovitis about the joint most consistent with infection. Marrow edema throughout the patella and in the tibia and femur is most consistent with osteomyelitis. No abscess is identified. Horizontal tear posterior horn lateral meniscus. Status post inferior patellectomy and reconstruction of patellar tendon. Marked atrophy of all musculature about the knee. Electronically Signed   By: Inge Rise M.D.   On: 09/27/2015 08:22   Dg Chest Port 1 View  09/26/2015  CLINICAL DATA:  Shortness of  breath. EXAM: PORTABLE CHEST 1 VIEW COMPARISON:  09/21/2015 FINDINGS: The cardiac silhouette, mediastinal and hilar contours are within normal limits and stable. Low lung volumes with vascular crowding and bibasilar atelectasis. Could not exclude a right lower lobe infiltrate. No definite pleural effusions. The right PICC line is stable. IMPRESSION: Low lung volumes with vascular crowding and bibasilar atelectasis. Could not exclude a right basilar infiltrate. Electronically Signed   By: Marijo Sanes M.D.   On: 09/26/2015 14:54   Dg Chest Portable 1 View  09/21/2015  CLINICAL DATA:  Fever. EXAM: PORTABLE CHEST 1 VIEW COMPARISON:  None. FINDINGS: Airspace opacity at the right base concerning for pneumonia given the history. Right upper extremity PICC, tip at the SVC level. Normal heart size and aortic contours for technique and mild left rotation. Remote posterior right sixth rib fracture. IMPRESSION: Right base opacity concerning for pneumonia. Electronically Signed   By: Monte Fantasia M.D.   On: 09/21/2015 20:47    Microbiology: Recent Results (from the past 240 hour(s))  Culture, blood (routine x 2)     Status: None   Collection Time: 09/26/15  1:53 PM  Result Value Ref Range Status   Specimen Description BLOOD PICC LINE DRAWN BY RN  Final   Special Requests BOTTLES DRAWN AEROBIC AND ANAEROBIC [place]  Final   Culture NO GROWTH 5 DAYS  Final   Report Status 10/01/2015 FINAL  Final  Culture, blood (routine x 2)     Status: None   Collection Time: 09/26/15  1:54 PM  Result Value Ref Range Status   Specimen Description BLOOD LEFT ANTECUBITAL  Final   Special Requests BOTTLES DRAWN AEROBIC AND ANAEROBIC [place]  Final   Culture NO GROWTH 5 DAYS  Final   Report Status 10/01/2015 FINAL  Final  Culture, blood (routine x 2)     Status: None   Collection Time: 09/27/15  6:18 PM  Result Value Ref Range Status   Specimen Description BLOOD RIGHT ANTECUBITAL  Final   Special Requests BOTTLES  DRAWN AEROBIC ONLY [place]  Final   Culture NO GROWTH 5 DAYS  Final   Report Status 10/02/2015 FINAL  Final  Culture, blood (routine x 2)     Status: None   Collection Time: 09/27/15  6:25 PM  Result Value Ref Range Status  Specimen Description BLOOD LEFT ANTECUBITAL  Final   Special Requests IN PEDIATRIC BOTTLE 3CC  Final   Culture  Setup Time   Final    GRAM POSITIVE COCCI IN CLUSTERS IN PEDIATRIC BOTTLE CRITICAL RESULT CALLED TO, READ BACK BY AND VERIFIED WITH: Carole Civil [phone] [place]    Culture   Final    METHICILLIN RESISTANT STAPHYLOCOCCUS AUREUS SUSCEPTIBILITIES PERFORMED ON PREVIOUS CULTURE WITHIN THE LAST 5 DAYS.    Report Status 09/30/2015 FINAL  Final  Surgical pcr screen     Status: None   Collection Time: 09/27/15 11:53 PM  Result Value Ref Range Status   MRSA, PCR NEGATIVE NEGATIVE Final   Staphylococcus aureus NEGATIVE NEGATIVE Final    Comment:        The Xpert SA Assay (FDA approved for NASAL specimens in patients over 66 years of age), is one component of a comprehensive surveillance program.  Test performance has been validated by [facility] for patients greater than or equal to 69 year old. It is not intended to diagnose infection nor to guide or monitor treatment.   Culture, blood (routine x 2)     Status: None   Collection Time: 09/29/15  3:21 PM  Result Value Ref Range Status   Specimen Description BLOOD RIGHT HAND  Final   Special Requests BOTTLES DRAWN AEROBIC ONLY 5CC  Final   Culture NO GROWTH 5 DAYS  Final   Report Status 10/04/2015 FINAL  Final  Culture, blood (routine x 2)     Status: None   Collection Time: 09/29/15  4:20 PM  Result Value Ref Range Status   Specimen Description BLOOD RIGHT HAND  Final   Special Requests IN PEDIATRIC BOTTLE 3CC  Final   Culture  Setup Time   Final    GRAM POSITIVE COCCI IN CLUSTERS AEROBIC BOTTLE ONLY CRITICAL RESULT CALLED TO, READ BACK BY AND VERIFIED WITH: EMMANUAL _0  10/01/15 MKELLY     Culture METHICILLIN RESISTANT STAPHYLOCOCCUS AUREUS  Final   Report Status 10/03/2015 FINAL  Final   Organism ID, Bacteria METHICILLIN RESISTANT STAPHYLOCOCCUS AUREUS  Final      Susceptibility   Methicillin resistant staphylococcus aureus - MIC*    CIPROFLOXACIN >=8 RESISTANT Resistant     ERYTHROMYCIN >=8 RESISTANT Resistant     GENTAMICIN <=0.5 SENSITIVE Sensitive     OXACILLIN >=4 RESISTANT Resistant     TETRACYCLINE <=1 SENSITIVE Sensitive     VANCOMYCIN 1 SENSITIVE Sensitive     TRIMETH/SULFA >=320 RESISTANT Resistant     CLINDAMYCIN <=0.25 SENSITIVE Sensitive     RIFAMPIN <=0.5 SENSITIVE Sensitive     Inducible Clindamycin NEGATIVE Sensitive     * METHICILLIN RESISTANT STAPHYLOCOCCUS AUREUS  Urine culture     Status: None   Collection Time: 09/29/15  4:46 PM  Result Value Ref Range Status   Specimen Description URINE, CATHETERIZED  Final   Special Requests NONE  Final   Culture MULTIPLE SPECIES PRESENT, SUGGEST RECOLLECTION  Final   Report Status 10/01/2015 FINAL  Final  Culture, blood (routine x 2)     Status: None   Collection Time: 10/01/15  4:25 PM  Result Value Ref Range Status   Specimen Description BLOOD RIGHT ARM  Final   Special Requests BOTTLES DRAWN AEROBIC ONLY 5CC  Final   Culture NO GROWTH 5 DAYS  Final   Report Status 10/06/2015 FINAL  Final  Culture, blood (routine x 2)     Status: None   Collection Time: 10/01/15  4:32 PM  Result Value Ref Range Status   Specimen Description BLOOD RIGHT HAND  Final   Special Requests BOTTLES DRAWN AEROBIC ONLY 3CC  Final   Culture NO GROWTH 5 DAYS  Final   Report Status 10/06/2015 FINAL  Final     Labs: Basic Metabolic Panel:  Recent Labs Lab 10/01/15 0454 10/02/15 0416 10/03/15 0605 10/05/15 0944 10/06/15 0640  NA 131* 133* 134* 137 139  K 3.7 4.4 3.3* 3.4* 3.3*  CL 105 108 107 104 105  CO2 19* 16* 21* 21* 24  GLUCOSE 170* 153* 158* 164* 132*  BUN 31* 29* 24* 19 18  CREATININE 1.54* 1.24* 1.14* 1.04*  1.09*  CALCIUM 7.0* 7.2* 7.4* 8.0* 8.1*  PHOS  --  2.4* 2.2* 3.4 3.5   Liver Function Tests:  Recent Labs Lab 10/01/15 0454 10/02/15 0416 10/03/15 0605 10/05/15 0944 10/06/15 0640  AST 21  --   --  20  --   ALT 13*  --   --  12*  --   ALKPHOS 81  --   --  91  --   BILITOT 0.5  --   --  0.4  --   PROT 3.9*  --   --  4.8*  --   ALBUMIN 1.4* 1.4* 1.5* 1.6* 1.7*   No results for input(s): LIPASE, AMYLASE in the last 168 hours.  Recent Labs Lab 09/30/15 0950  AMMONIA 53*   CBC:  Recent Labs Lab 09/30/15 0610 10/01/15 0454 10/05/15 0944  WBC 22.2* 23.0* 14.6*  NEUTROABS  --  13.7*  --   HGB 9.0* 8.7* 8.5*  HCT 28.2* 27.1* 27.3*  MCV 77.3* 78.3 77.8*  PLT 374 364 304   Cardiac Enzymes:  Recent Labs Lab 10/05/15 0944  CKTOTAL 15*   BNP: BNP (last 3 results)  Recent Labs  09/26/15 1414  BNP 48.0    ProBNP (last 3 results) No results for input(s): PROBNP in the last 8760 hours.  CBG:  Recent Labs Lab 10/05/15 0733 10/05/15 1128 10/05/15 1629 10/05/15 2118 10/06/15 0748  GLUCAP 134* 142* 114* 122* 115*       Signed:  Nita Sells  [facility] 10/06/2015, 9:41 AM

## 2015-10-06 NOTE — Clinical Social Work Note (Signed)
[facility] refuses to take patient back due to cost of antibiotics. CSW has made patient and husband aware. Both state they are agreeable to [facility]. Unit CSW will need to confirm with [facility] that they can accept the patient.   Roddie McBryant Jermar Colter MSW, ChickenLCSW, [place]ASA, [phone]

## 2015-10-06 NOTE — Progress Notes (Signed)
Physical Therapy Treatment Patient Details Name: Lindsay Cobb MRN: [id number] DOB: 11/21/1941 Today's Date: 10/06/2015    History of Present Illness 74 y.o. female with L patellar fx sustained in a fall. She underwent partial patellectomy 07-11-15. Returned for infection of L knee and thigh, with irrigation and debridement before being d/c to nursing facility. 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    PT Comments    Pt able to tolerate standing x2 this date but required maxAx2 to complete. Pt able to tolerate L LE there ex with minimal phantom limb pain. Pt remains appropriate for SNF upon d/c for maximal functional recovery.  Follow Up Recommendations  SNF     Equipment Recommendations  None recommended by PT    Recommendations for Other Services       Precautions / Restrictions Precautions Precautions: Fall Precaution Comments: L AKA Restrictions Weight Bearing Restrictions: Yes LLE Weight Bearing: Non weight bearing    Mobility  Bed Mobility Overal bed mobility: Needs Assistance Bed Mobility: Supine to Sit;Sit to Supine     Supine to sit: Mod assist;+2 for physical assistance Sit to supine: Mod assist;+2 for physical assistance   General bed mobility comments: pt able to bring R LE off EOB and initiate transfer with UEs but con't to require assist for trunk elevation and to bring hips to EOB for optimal sitting position  Transfers Overall transfer level: Needs assistance Equipment used:  (2 person lift with gait belt and bed pad) Transfers: Sit to/from Stand Sit to Stand: Max assist;+2 physical assistance;From elevated surface         General transfer comment: completed x2, pt did pull up with UEs however unable to maintain longer than 20 seconds without sitting down  Ambulation/Gait                 Stairs            Wheelchair Mobility    Modified Rankin (Stroke Patients Only)       Balance Overall balance  assessment: Needs assistance Sitting-balance support: Feet supported;No upper extremity supported Sitting balance-Leahy Scale: Fair Sitting balance - Comments: sat EOB x 10 min                            Cognition Arousal/Alertness: Awake/alert Behavior During Therapy: WFL for tasks assessed/performed Overall Cognitive Status: Within Functional Limits for tasks assessed                      Exercises General Exercises - Lower Extremity Straight Leg Raises: AROM;Left;10 reps;Supine Hip Flexion/Marching: AROM;Left;10 reps;Supine Amputee Exercises Gluteal Sets: AROM;Both;10 reps;Supine Hip ABduction/ADduction: PROM;Both;10 reps;Supine    General Comments        Pertinent Vitals/Pain Pain Assessment: 0-10 Pain Score: 3  Pain Location: some phantom L LE limb pain    Home Living                      Prior Function            PT Goals (current goals can now be found in the care plan section) Progress towards PT goals: Progressing toward goals    Frequency  Min 2X/week    PT Plan Current plan remains appropriate    Co-evaluation             End of Session Equipment Utilized During Treatment: Gait belt Activity Tolerance: Patient tolerated treatment well Patient  left: in bed;with call bell/phone within reach;with family/visitor present     Time: [id number] PT Time Calculation (min) (ACUTE ONLY): 39 min  Charges:  $Therapeutic Exercise: 8-22 mins $Therapeutic Activity: 23-37 mins                    G Codes:      Marcene BrawnChadwell, Terrie Haring Marie 10/06/2015, 3:13 PM  Lewis ShockAshly Xane Amsden, PT, DPT Pager #: [phone] Office #: [phone]

## 2015-10-06 NOTE — Progress Notes (Signed)
Peripherally Inserted Central Catheter/Midline Placement  The IV Nurse has discussed with the patient and/or persons authorized to consent for the patient, the purpose of this procedure and the potential benefits and risks involved with this procedure.  The benefits include less needle sticks, lab draws from the catheter and patient may be discharged home with the catheter.  Risks include, but not limited to, infection, bleeding, blood clot (thrombus formation), and puncture of an artery; nerve damage and irregular heat beat.  Alternatives to this procedure were also discussed.  PICC/Midline Placement Documentation      Husband signed  consentatbedside  Lindsay Cobb, Lindsay Cobb 10/06/2015, 6:50 PM

## 2015-10-06 NOTE — Care Management Note (Signed)
Case Management Note  Patient Details  Name: Lindsay Cobb MRN: [id number] Date of Birth: February 04, 1941  Subjective/Objective:             CM following for progression and d/c planning.       Action/Plan: 10/06/2015 Met with pt and husband, plan to return to SNF @ [facility], IM given no further needs identified.   Expected Discharge Date:    10/06/2015              Expected Discharge Plan:  Skilled Nursing Facility  In-House Referral:  Clinical Social Work  Discharge planning Services  CM Consult  Post Acute Care Choice:  NA Choice offered to:  NA  DME Arranged:   NA  DME Agency:   NA  HH Arranged:   NA HH Agency:   NA  Status of Service:  Completed, signed off  Medicare Important Message Given:  Yes-fourth notification given Date Medicare IM Given:    Medicare IM give by:    Date Additional Medicare IM Given:    Additional Medicare Important Message give by:     If discussed at [place] of Stay Meetings, dates discussed:    Additional Comments:  Adron Bene, RN 10/06/2015, 11:57 AM

## 2015-10-06 NOTE — Care Management Important Message (Signed)
Important Message  Patient Details  Name: Lindsay Cobb MRN: [id number] Date of Birth: 03/06/1941   Medicare Important Message Given:  Yes-fourth notification given    RoyalAnnamarie Major, Sapphire Tygart U, RN 10/06/2015, 11:56 AM

## 2015-10-06 NOTE — Progress Notes (Signed)
Report given to oncoming RN concerning leaking foley catheter.   Veatrice KellsMahmoud,Shemekia Patane I, RN

## 2015-10-06 NOTE — Progress Notes (Signed)
Previous RN informed this RN that patient's foley catheter has been leaking. NP on call notified. NP suggested to leave foley catheter in and to inform oncoming RN to discuss with MD during rounding. RN will update Public relations account executiveoncoming RN.   Veatrice KellsMahmoud,Nakkia Mackiewicz I, RN

## 2015-10-07 ENCOUNTER — Other Ambulatory Visit: Payer: Self-pay | Admitting: Licensed Clinical Social Worker

## 2015-10-07 ENCOUNTER — Encounter: Payer: Self-pay | Admitting: Licensed Clinical Social Worker

## 2015-10-07 DIAGNOSIS — L89300 Pressure ulcer of unspecified buttock, unstageable: Secondary | ICD-10-CM

## 2015-10-07 HISTORY — DX: Pressure ulcer of unspecified buttock, unstageable: L89.300

## 2015-10-07 LAB — RENAL FUNCTION PANEL
ALBUMIN: 1.6 g/dL — AB (ref 3.5–5.0)
ANION GAP: 8 (ref 5–15)
BUN: 15 mg/dL (ref 6–20)
CHLORIDE: 98 mmol/L — AB (ref 101–111)
CO2: 28 mmol/L (ref 22–32)
Calcium: 7.6 mg/dL — ABNORMAL LOW (ref 8.9–10.3)
Creatinine, Ser: 1.08 mg/dL — ABNORMAL HIGH (ref 0.44–1.00)
GFR calc Af Amer: 58 mL/min — ABNORMAL LOW (ref >60)
GFR calc non Af Amer: 50 mL/min — ABNORMAL LOW (ref >60)
GLUCOSE: 119 mg/dL — AB (ref 65–99)
PHOSPHORUS: 3.3 mg/dL (ref 2.5–4.6)
POTASSIUM: 2.7 mmol/L — AB (ref 3.5–5.1)
Sodium: 134 mmol/L — ABNORMAL LOW (ref 135–145)

## 2015-10-07 LAB — GLUCOSE, CAPILLARY
GLUCOSE-CAPILLARY: 105 mg/dL — AB (ref 65–99)
Glucose-Capillary: 119 mg/dL — ABNORMAL HIGH (ref 65–99)
Glucose-Capillary: 117 mg/dL — ABNORMAL HIGH (ref 65–99)
Glucose-Capillary: 115 mg/dL — ABNORMAL HIGH (ref 65–99)

## 2015-10-07 MED ORDER — SODIUM CHLORIDE 0.9 % IV SOLN: 900.0000 mg | INTRAVENOUS | Status: DC

## 2015-10-07 MED ORDER — POTASSIUM CHLORIDE CRYS ER 20 MEQ PO TBCR
40.0000 meq | EXTENDED_RELEASE_TABLET | Freq: Two times a day (BID) | ORAL | Status: DC
  Administered 2015-10-07 – 2015-10-08 (×3): 40 meq via ORAL
  Filled 2015-10-07 (×2): qty 2

## 2015-10-07 MED ORDER — POTASSIUM CHLORIDE CRYS ER 20 MEQ PO TBCR: 40.0000 meq | EXTENDED_RELEASE_TABLET | Freq: Two times a day (BID) | ORAL | Status: DC

## 2015-10-07 NOTE — Progress Notes (Signed)
CRITICAL LAB VALUE Potassium:2.7 MD notified. RN will continue to monitor patient.  Veatrice KellsMahmoud,Elian Gloster I, RN

## 2015-10-07 NOTE — Patient Outreach (Signed)
Assessment:  CSW called client phone number on 10/07/15.  CSW spoke via phone with client on 10/07/15. CSW verified identity of client.  Client is currently still a patient at [facility] in [place], [place]. Client said she had hoped to return, after hospital discharge to [facility]. Client informed CSW on 10/07/15 that she was not sure which skilled nursing facility she may go to now upon hospital discharge. CSW talked with client about 3 skilled nursing facilities in [place], [place] and about one skilled nursing facility in [place], [place].  CSW informed client that [facility] has clinical social worker who can work with client to help client review possible skilled nursing facilities for client in [place] and in , [place]. Client reported to CSW that she and her sister, [name], had met recently with clinical social worker from [place] to discuss discharge plans for client.  CSW encouraged client to ask RN at charge desk for this clinical social worker at [facility] to again visit with client and her sister to discuss client discharge plans from [place].  Client said she would ask for this hospital social worker to again visit client to discuss client discharge plan.  CSW gave client [facility] CSW phone number of [phone] and encouraged [place] to call CSW as needed to discuss social work needs of client.   Plan: Client to continue to communicate as needed with clinical social worker at [facility] to finalize client discharge plans from the hospital. CSW to review EPIC notes to determine client discharge plan from [place]. CSW to contact client next week to assess needs of client at that time.   S. MSW, LCSW Licensed Clinical Social Worker [facility] Care Management [phone] 

## 2015-10-07 NOTE — Clinical Social Work Note (Signed)
Patient came to hospital from [facility], but cannot return due to the cost of the antibiotic Daptomycin.  CSW talked with Marta Lamasebbie Powell, admissions director at [facility] regarding patient (as they extended a bed offer) and advised her about the Daptomycin (was also in comments section of FL-2).  After getting cost information for medication, CSW was informed by Gigi Gineggy that the cost of this medication is $1,447.00 per day and their total cost for meds (including this medication) would be approx. $1,600.00 per day. CSW was asked if MD could prescribe another medication as the cost will prohibit them from accepting patient. CSW has a call into Dr. Jacky KindleAronson (medical director of clinical social work) regarding patient and needed medication to determine appropriate course of action.   Genelle BalVanessa Carletta Feasel, MSW, LCSW Licensed Clinical Social Worker Clinical Social Work Department [facility] [phone]

## 2015-10-07 NOTE — Progress Notes (Signed)
  Labs ]and patient reviewed in person  No sigf c/o Wants to know why cannot go back to [facility] Explained will need abx-looks like planning[facility] placement?  Other than hypokalemia, which will be Rx, no furhte rchanges  Patient stable for d/c  Pleas KochJai Savaya Hakes, MD [facility] [phone](P) [phone]

## 2015-10-07 NOTE — Progress Notes (Signed)
Nutrition Follow-up  DOCUMENTATION CODES:   Obesity unspecified  INTERVENTION:   Continue Glucerna Shake po BID, each supplement provides 220 kcal and 10 grams of protein.  Continue 30 ml Prostat po BID, each supplement provides 100 kcal and 15 grams of protein.   Encourage adequate PO intake.   NUTRITION DIAGNOSIS:   Increased nutrient needs related to wound healing as evidenced by estimated needs; ongoing  GOAL:   Patient will meet greater than or equal to 90% of their needs; met  MONITOR:   PO intake, Supplement acceptance, Weight trends, Labs, I & O's, Skin  REASON FOR ASSESSMENT:   Consult Wound healing  ASSESSMENT:   74 year old patient who was recently discharged from [facility] on 10/11 after undergoing incision and drainage of left knee by Dr. Erlinda Hong. Wound cultures at that time indicated MRSA. She was discharged with a PICC line on vancomycin to skilled nursing facility. She'll return to the hospital with fevers and questionable pneumonia. Blood cultures returned positive for Staphylococcus aureus. 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  Meal completion has been varied from 25-75%. Pt currently has Glucerna and Prostat ordered to aid in wound healing as well as in adequate caloric and protein needs. Pt has been consuming most of the Prostat, however Glucerna has been more varied. RD to continue with current orders. Plans for pt to be discharged to SNF.  Labs and medications reviewed.   Diet Order:  Diet Carb Modified Fluid consistency:: Thin; Room service appropriate?: Yes Diet - low sodium heart healthy  Skin:  Wound (see comment) (Stage III ulcer on sacrum, incision on L leg)  Last BM:  10/31  Height:   Ht Readings from Last 1 Encounters:  09/21/15 _0  (1.676 m)    Weight:   Wt Readings from Last 1 Encounters:  10/06/15 234 lb 12.6 oz (106.5 kg)    Ideal Body Weight:  54.28 kg (Adjusted for L AKA)  BMI:  Body mass index is 37.91  kg/(m^2).  Estimated Nutritional Needs:   Kcal:  1900-2100  Protein:  110-125 grams  Fluid:  1.9 - 2.1 L/day  EDUCATION NEEDS:   No education needs identified at this time  Corrin Parker, MS, RD, LDN Pager # [phone] After hours/ weekend pager # [phone]

## 2015-10-08 ENCOUNTER — Inpatient Hospital Stay
Admission: RE | Admit: 2015-10-08 | Discharge: 2015-10-14 | Disposition: A | Discharge status: Other Acute Inpatient Hospital | Payer: Commercial Managed Care - HMO | Source: Ambulatory Visit | Attending: Internal Medicine | Admitting: Internal Medicine

## 2015-10-08 LAB — RENAL FUNCTION PANEL
Albumin: 1.7 g/dL — ABNORMAL LOW (ref 3.5–5.0)
Anion gap: 12 (ref 5–15)
BUN: 13 mg/dL (ref 6–20)
CHLORIDE: 98 mmol/L — AB (ref 101–111)
CO2: 28 mmol/L (ref 22–32)
Calcium: 8 mg/dL — ABNORMAL LOW (ref 8.9–10.3)
Creatinine, Ser: 1.04 mg/dL — ABNORMAL HIGH (ref 0.44–1.00)
GFR calc Af Amer: >60 mL/min (ref >60)
GFR calc non Af Amer: 52 mL/min — ABNORMAL LOW (ref >60)
GLUCOSE: 115 mg/dL — AB (ref 65–99)
POTASSIUM: 3.3 mmol/L — AB (ref 3.5–5.1)
Phosphorus: 3 mg/dL (ref 2.5–4.6)
Sodium: 138 mmol/L (ref 135–145)

## 2015-10-08 LAB — GLUCOSE, CAPILLARY
Glucose-Capillary: 106 mg/dL — ABNORMAL HIGH (ref 65–99)
Glucose-Capillary: 115 mg/dL — ABNORMAL HIGH (ref 65–99)
Glucose-Capillary: 99 mg/dL (ref 65–99)

## 2015-10-08 MED ORDER — HEPARIN SOD (PORK) LOCK FLUSH 100 UNIT/ML IV SOLN
250.0000 [IU] | INTRAVENOUS | Status: AC | PRN
  Administered 2015-10-08: 250 [IU]

## 2015-10-08 NOTE — Progress Notes (Signed)
[facility] with [facility]. Per [facility] bed not available they can not take her.  Erie NoeVanessa SW notified.

## 2015-10-08 NOTE — Progress Notes (Signed)
PROGRESS NOTE  Lindsay Cobb UJW:[id number] DOB: [birth year] DOA: 09/21/2015 PCP: [name], MD  Assessment/Plan: Sepsis with Staph aureus bacteremia/ wound dehiscence with extensive left knee osteomyelitis  IV daptomycin until December 20th, 2016 Dr [name] and Dr [name] following .  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  Ongoing sepsis with acute encephalopathy noted 10/24 Patient less confused. No new issues Felt to metabolic encephalopathy 2/2 to hyperammonemia and medications -Added Lactulose 30 mls twice a day 10/25 and would continue on going for 1-2 loose stools a day Would continue this ongoing  HCAP Afebrile. sats stable. Not in distress. continue daptomycin.  Acute kidney injury Renal function much improved Creat 2.2->1.87-->1.5-->1.14. Patient's basleine 0.8 suspect prerenal etiology. off vanco and ACEi.  FeNa= 0.1 indicative pre-renal cause Nephrology consulted appreciated Resumed lasix 40 po daily as slight swelling in feet/arms  Hypotension Initially was discontinued off of blood pressure meds On discharge we did resume benazepril 20 daily, atenolol 50 daily and amlodipine 10 daily If labs show further volume depletion as an outpatient, would discontinue ACE inhibitor monitor with IV fluids.   Hyponatremia Stable in 134 range Normal for this patient between 134-140 Rpt am labs  Protein calorie malnutrition Added supplements.  Diabetes mellitus type 2 Resumed on discharge metformin 1000 twice a day, Januvia 1 tablet daily,  glipizide 1 tablet twice a day was discontinued Consider insulin as an  outpatient   Code Status: full Family Communication: husband at bedside Disposition Plan: await abx approval at SNF   Consultants:  ID  Ortho  nephrology  Procedures:      HPI/Subjective: No complaints  Objective: Filed Vitals:   10/08/15 0741  BP: 140/63  Pulse: 78  Temp: 98.6 F (37 C)  Resp: 17    Intake/Output Summary (Last 24 hours) at 10/08/15 1155 Last data filed at 10/08/15 5621  Gross per 24 hour  Intake    952 ml  Output      0 ml  Net    952 ml   Filed Weights   10/02/15 2058 10/04/15 2006 10/06/15 2110  Weight: 114 kg (251 lb 5.2 oz) 111.7 kg (246 lb 4.1 oz) 106.5 kg (234 lb 12.6 oz)    Exam:   General:  Awake, NAD   Data Reviewed: Basic Metabolic Panel:  Recent Labs Lab 10/03/15 0605 10/05/15 0944 10/06/15 0640 10/07/15 0445 10/08/15 0415  NA 134* 137 139 134* 138  K 3.3* 3.4* 3.3* 2.7* 3.3*  CL 107 104 105 98* 98*  CO2 21* 21* GLUCOSE 158* 164* 132* 119* 115*  BUN 24* CREATININE 1.14* 1.04* 1.09* 1.08* 1.04*  CALCIUM 7.4* 8.0* 8.1* 7.6* 8.0*  PHOS 2.2* 3.4 3.5 3.3 3.0   Liver Function Tests:  Recent Labs Lab 10/03/15 0605 10/05/15 0944 10/06/15 0640 10/07/15 0445 10/08/15 0415  AST  --  20  --   --   --   ALT  --  12*  --   --   --   ALKPHOS  --  91  --   --   --   BILITOT  --  0.4  --   --   --  PROT  --  4.8*  --   --   --   ALBUMIN 1.5* 1.6* 1.7* 1.6* 1.7*   No results for input(s): LIPASE, AMYLASE in the last 168 hours. No results for input(s): AMMONIA in the last 168 hours. CBC:  Recent Labs Lab 10/05/15 0944  WBC 14.6*  HGB 8.5*  HCT 27.3*  MCV 77.8*  PLT 304   Cardiac Enzymes:  Recent Labs Lab 10/05/15 0944  CKTOTAL 15*   BNP (last 3 results)  Recent Labs  09/26/15 1414  BNP 48.0    ProBNP (last 3 results) No results for input(s): PROBNP in the last 8760 hours.  CBG:  Recent Labs Lab 10/07/15 1202 10/07/15 1727 10/07/15 2141 10/08/15 0740  10/08/15 1129  GLUCAP 117* 115* 105* 106* 115*    Recent Results (from the past 240 hour(s))  Culture, blood (routine x 2)     Status: None   Collection Time: 09/29/15  3:21 PM  Result Value Ref Range Status   Specimen Description BLOOD RIGHT HAND  Final   Special Requests BOTTLES DRAWN AEROBIC ONLY 5CC  Final   Culture NO GROWTH 5 DAYS  Final   Report Status 10/04/2015 FINAL  Final  Culture, blood (routine x 2)     Status: None   Collection Time: 09/29/15  4:20 PM  Result Value Ref Range Status   Specimen Description BLOOD RIGHT HAND  Final   Special Requests IN PEDIATRIC BOTTLE 3CC  Final   Culture  Setup Time   Final    GRAM POSITIVE COCCI IN CLUSTERS AEROBIC BOTTLE ONLY CRITICAL RESULT CALLED TO, READ BACK BY AND VERIFIED WITH: [facility] @0502  10/01/15 MKELLY    Culture METHICILLIN RESISTANT STAPHYLOCOCCUS AUREUS  Final   Report Status 10/03/2015 FINAL  Final   Organism ID, Bacteria METHICILLIN RESISTANT STAPHYLOCOCCUS AUREUS  Final      Susceptibility   Methicillin resistant staphylococcus aureus - MIC*    CIPROFLOXACIN >=8 RESISTANT Resistant     ERYTHROMYCIN >=8 RESISTANT Resistant     GENTAMICIN <=0.5 SENSITIVE Sensitive     OXACILLIN >=4 RESISTANT Resistant     TETRACYCLINE <=1 SENSITIVE Sensitive     VANCOMYCIN 1 SENSITIVE Sensitive     TRIMETH/SULFA >=320 RESISTANT Resistant     CLINDAMYCIN <=0.25 SENSITIVE Sensitive     RIFAMPIN <=0.5 SENSITIVE Sensitive     Inducible Clindamycin NEGATIVE Sensitive     * METHICILLIN RESISTANT STAPHYLOCOCCUS AUREUS  Urine culture     Status: None   Collection Time: 09/29/15  4:46 PM  Result Value Ref Range Status   Specimen Description URINE, CATHETERIZED  Final   Special Requests NONE  Final   Culture MULTIPLE SPECIES PRESENT, SUGGEST RECOLLECTION  Final   Report Status 10/01/2015 FINAL  Final  Culture, blood (routine x 2)     Status: None   Collection Time: 10/01/15  4:25 PM  Result Value Ref Range Status   Specimen  Description BLOOD RIGHT ARM  Final   Special Requests BOTTLES DRAWN AEROBIC ONLY 5CC  Final   Culture NO GROWTH 5 DAYS  Final   Report Status 10/06/2015 FINAL  Final  Culture, blood (routine x 2)     Status: None   Collection Time: 10/01/15  4:32 PM  Result Value Ref Range Status   Specimen Description BLOOD RIGHT HAND  Final   Special Requests BOTTLES DRAWN AEROBIC ONLY 3CC  Final   Culture NO GROWTH 5 DAYS  Final   Report Status 10/06/2015 FINAL  Final     Studies: No results found.  Scheduled Meds: . atenolol  50 mg Oral Daily  . benazepril  20 mg Oral Daily  . DAPTOmycin (CUBICIN)  IV  900 mg Intravenous Q24H  . feeding supplement (GLUCERNA SHAKE)  237 mL Oral BID BM  . feeding supplement (PRO-STAT SUGAR FREE 64)  30 mL Oral BID  . ferrous sulfate  325 mg Oral Q breakfast  . furosemide  40 mg Oral Daily  . heparin  5,000 Units Subcutaneous 3 times per day  . insulin aspart  0-5 Units Subcutaneous QHS  . insulin aspart  0-9 Units Subcutaneous TID WC  . lactulose  20 g Oral BID  . linagliptin  5 mg Oral Daily  . potassium chloride  40 mEq Oral BID   Continuous Infusions: . sodium chloride 10 mL/hr at 10/02/15 1440   Antibiotics Given (last 72 hours)    Date/Time Action Medication Dose Rate   10/05/15 1827 Given   DAPTOmycin (CUBICIN) 900 mg in sodium chloride 0.9 % IVPB 900 mg 236 mL/hr   10/06/15 1740 Given   DAPTOmycin (CUBICIN) 900 mg in sodium chloride 0.9 % IVPB 900 mg 236 mL/hr   10/07/15 1801 Given   DAPTOmycin (CUBICIN) 900 mg in sodium chloride 0.9 % IVPB 900 mg 236 mL/hr      Principal Problem:   HCAP (healthcare-associated pneumonia) Active Problems:   Type II diabetes mellitus (HCC)   Essential hypertension, benign   Obesity   Microcytic anemia   Wound dehiscence, surgical   Hypokalemia   Decubitus ulcer of left buttock, unstageable (HCC)   Absolute anemia   Staphylococcus aureus bacteremia   Acute renal failure (HCC)   Atelectasis   Bacterial  infection of knee joint (HCC)   Staphylococcus aureus bacteremia with sepsis (HCC)   MRSA bacteremia   Chronic osteomyelitis of right femur (HCC)   Subacute osteomyelitis of right tibia (HCC)   Acute kidney injury (HCC)   Hyperkalemia   Idiopathic hypotension   Acute encephalopathy   Bacteremia   Pyrexia    Time spent: 25 min    Deetya Drouillard  [facility] Pager [phone]. If 7PM-7AM, please contact night-coverage at [email or web address], password [facility] 10/08/2015, 11:55 AM  LOS: 16 days

## 2015-10-08 NOTE — Progress Notes (Signed)
Report called to Maureen RalphsVivian at [facility].  Bed should be ready in about an hour.

## 2015-10-08 NOTE — Clinical Social Work Note (Signed)
Mrs. Lindsay Cobb will discharge back to [facility] this evening via ambulance. CSW talked with patient and her husband regarding discharge and they are pleased that patient going back to [facility] today.  CSW received insurance authorization from [place] with [employer] earlier today.   Genelle BalVanessa Aamya Orellana, MSW, LCSW Licensed Clinical Social Worker Clinical Social Work Department [facility] [phone]

## 2015-10-08 NOTE — Progress Notes (Signed)
Patient discharged with transport. Paper work for facility given to transport team.

## 2015-10-08 NOTE — Discharge Summary (Signed)
Physician Discharge Summary  Lindsay Cobb:[id number] DOB: 1940/12/07 DOA: 09/21/2015  PCP: Vic Blackbird, MD  Admit date: 09/21/2015 Discharge date: 10/08/2015  Time spent: 45 minutes  Recommendations for Outpatient Follow-up:  1. Suggest chem-12 and cbc + diff on d/c after 5 days 2. started potassium for hypokalemia on 11/1-rechekc labs 3-4 days 3. Patient will  need consideration for prosthesis as per Dr Sharol Given as an outpatient and we will attempt to make an appointment to address this 4. Should complete Cubicin on 11/25/15, which would be a 8 week course 5. Please report CK, ESR, CRP, Chem-7, CBC plus differential to Dr. Lucianne Lei dam of infectious disease weekly at his office at 6. SNF 7. 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  Filed Weights   10/02/15 2058 10/04/15 2006 10/06/15 2110  Weight: 114 kg (251 lb 5.2 oz) 111.7 kg (246 lb 4.1 oz) 106.5 kg (234 lb 12.6 oz)    History of present illness:  66 ? Recent d/c from [facility] on 10/11 after undergoing incision and drainage of left knee by Dr. Erlinda Hong.  Wound cultures at that time indicated MRSA. She was discharged with a PICC line on vancomycin to skilled nursing facility.  She'll return to the hospital with fevers and questionable pneumonia.  She was continued on vancomycin and started on Zosyn.  Blood cultures returned positive for Staphylococcus aureus.  Discussed with infectious disease who recommended transfer to [facility] for orthopedic evaluation of left knee since it is felt that this is the likely source of her bacteremia. PICC line removed and echocardiogram done showing EF of 65-70% without wall motion abnormality or vegetation. Vancomycin was discontinued in favor of daptomycin due to rising creatinine.  After discussion with ID and ortho ( Dr Sharol Given) pt transferred to [facility] for further evaluation.   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 SHe has failed to clear bacteremia and will need neg bc x2 prior to d/c with indwelling PICC   Hospital Course:  Sepsis with Staph aureus bacteremia/ wound dehiscence with extensive left knee osteomyelitis IV daptomycin until December 20th, 2016 Dr Lucianne Lei dam and Dr Sharol Given following .  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  Ongoing sepsis with acute encephalopathy noted 10/24 Patient less confused. No new issues Felt to metabolic encephalopathy 2/2 to hyperammonemia and medications -Added Lactulose 30 mls twice a day 10/25 and  would continue on going for 1-2 loose stools a day Would continue this ongoing  HCAP Afebrile. sats stable. Not in distress. continue daptomycin.  Acute kidney injury Renal function much improved Creat 2.2->1.87-->1.5-->1.14. Patient's basleine 0.8 suspect prerenal etiology. off vanco and ACEi.  FeNa= 0.1 indicative pre-renal  cause Nephrology consulted appreciated Resumed lasix 40 po daily as slight swelling in feet/arms  Hypotension Initially was discontinued off of blood pressure meds On discharge we did resume benazepril 20 daily, atenolol 50 daily and amlodipine 10 daily If labs show further volume depletion as an outpatient, would discontinue ACE inhibitor monitor with IV fluids.   Hyponatremia Stable in 134 range Normal for this patient between 134-140   Protein calorie malnutrition Added supplements.  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  Patient is hopeful to discharge back to [facility] and then return home with family. Consent signed.  Recent Left AKA, and please have social worker alert assigned nurse for return home and needs at that point.  For questions, please contact: Natividad Brood, RN BSN [facility] Liaison  [phone] business mobile phone     Diet - low sodium heart healthy    Complete by:  As directed      Increase activity slowly    Complete by:  As directed           Current Discharge Medication List    START taking these medications   Details  DAPTOmycin 900 mg in sodium chloride 0.9 % 100 mL Inject  900 mg into the vein daily. Qty: 1 g, Refills: 0    lactulose (CHRONULAC) 10 GM/15ML solution Take 30 mLs (20 g total) by mouth 2 (two) times daily. Qty: 240 mL, Refills: 0    potassium chloride SA (K-DUR,KLOR-CON) 20 MEQ tablet Take 2 tablets (40 mEq total) by mouth 2 (two) times daily. Qty: 10 tablet, Refills: 0      CONTINUE these medications which have CHANGED   Details  furosemide (LASIX) 40 MG tablet Take 1 tablet (40 mg total) by mouth daily. Qty: 30 tablet, Refills: 0      CONTINUE these medications which have NOT CHANGED   Details  amLODipine (NORVASC) 10 MG tablet TAKE 1 TABLET EVERY DAY Qty: 90 tablet, Refills: 1    aspirin EC 325 MG tablet Take 1 tablet (325 mg total) by mouth daily. Qty: 84 tablet, Refills: 0    atenolol (TENORMIN) 50 MG tablet Take 1 tablet (50 mg total) by mouth daily. Qty: 90 tablet, Refills: 1    benazepril (LOTENSIN) 20 MG tablet TAKE 1 TABLET EVERY DAY Qty: 90 tablet, Refills: 1    ferrous sulfate 325 (65 FE) MG tablet Take  325 mg by mouth daily with breakfast.  Refills: 0    imipramine (TOFRANIL) 50 MG tablet Take 1 tablet (50 mg total) by mouth 3 (three) times daily. Qty: 270 tablet, Refills: 1    JANUVIA 25 MG tablet TAKE ONE TABLET BY MOUTH ONCE DAILY Qty: 30 tablet, Refills: 3    lovastatin (MEVACOR) 10 MG tablet Take 1 tablet (10 mg total) by mouth at bedtime. Qty: 90 tablet, Refills: 2    metFORMIN (GLUCOPHAGE) 1000 MG tablet TAKE 1 TABLET TWICE DAILY WITH A MEAL Qty: 180 tablet, Refills: 1    oxyCODONE (OXY IR/ROXICODONE) 5 MG immediate release tablet Take 1-3 tablets (5-15 mg total) by mouth every 4 (four) hours as needed. Qty: 180 tablet, Refills: 0    glucose blood (ACCU-CHEK AVIVA) test strip Use to monitor FSBS 1x daily. Dx: E11.9 Qty: 50 each, Refills: 6      STOP taking these medications     glipiZIDE (GLUCOTROL) 10 MG tablet      vancomycin (VANCOCIN) 1 GM/200ML SOLN        No Known Allergies Follow-up  Information    Follow up with Newt Minion, MD On 10/21/2015.   Specialty:  Orthopedic Surgery   Why:  At 245pm   Contact information:   [address] [place] [address] [phone]        The results of significant diagnostics from this hospitalization (including imaging, microbiology, ancillary and laboratory) are listed below for reference.    Significant Diagnostic Studies: US Renal  09/29/2015  CLINICAL DATA:  Acute renal injury. EXAM: RENAL / URINARY TRACT ULTRASOUND COMPLETE COMPARISON:  None. FINDINGS: Extremely limited exam due to body habitus. Right Kidney: Length: 9.8 cm.  No definite hydronephrosis. Left Kidney: Length: 9.5 cm.  No definite hydronephrosis. Bladder: Not visualized. IMPRESSION: Extremely limited examination due to body habitus. No definite hydronephrosis. Electronically Signed   By: Lovey Newcomer M.D.   On: 09/29/2015 18:49   Mr Knee Left  Wo Contrast  09/27/2015  CLINICAL DATA:  Status post partial left patellectomy 07/11/2015 due to a fracture. The patient's surgical wound subsequently became infected and dehisced. Status post irrigation and debridement. Now with staphylococcal bacteremia. EXAM: MRI OF THE LEFT KNEE WITHOUT CONTRAST TECHNIQUE: Multiplanar, multisequence MR imaging of the knee was performed. No intravenous contrast was administered. COMPARISON:  Plain films left knee 07/04/2015. FINDINGS: Surgical wound is seen anteriorly off the inferior pole the patella. There is diffuse and intense subcutaneous edema about the knee. All imaged musculature demonstrates marked atrophy. MENISCI Medial meniscus:  Intact. Lateral meniscus: There is a horizontal tear in the posterior horn of the lateral meniscus reaching the meniscal undersurface. The body of the lateral meniscus is extruded peripherally. LIGAMENTS Cruciates:  Intact. Collaterals:  Intact. CARTILAGE Patellofemoral: Extensive hyaline cartilage loss is present diffusely. Medial:  Thinned  throughout. Lateral:  Thinned throughout. Joint: A small amount of joint fluid is present. Synovium about the joint appears markedly thickened. Intense edema is seen in Hoffa's fat throughout. Popliteal Fossa:  No Baker's cyst. Extensor Mechanism: The patient is status post inferior patellectomy and reconstruction of the patellar tendon. The extensor mechanism appears intact. Bones: Marrow edema is seen throughout the patella and in the medial and lateral femoral condyles and tibial plateaus. The appearance is highly suspicious for osteomyelitis. IMPRESSION: Intense subcutaneous edema and synovitis about the joint most consistent with infection. Marrow edema throughout the patella and in the tibia and femur is most consistent with osteomyelitis. No abscess  is identified. Horizontal tear posterior horn lateral meniscus. Status post inferior patellectomy and reconstruction of patellar tendon. Marked atrophy of all musculature about the knee. Electronically Signed   By: Inge Rise M.D.   On: 09/27/2015 08:22   Dg Chest Port 1 View  09/26/2015  CLINICAL DATA:  Shortness of breath. EXAM: PORTABLE CHEST 1 VIEW COMPARISON:  09/21/2015 FINDINGS: The cardiac silhouette, mediastinal and hilar contours are within normal limits and stable. Low lung volumes with vascular crowding and bibasilar atelectasis. Could not exclude a right lower lobe infiltrate. No definite pleural effusions. The right PICC line is stable. IMPRESSION: Low lung volumes with vascular crowding and bibasilar atelectasis. Could not exclude a right basilar infiltrate. Electronically Signed   By: Marijo Sanes M.D.   On: 09/26/2015 14:54   Dg Chest Portable 1 View  09/21/2015  CLINICAL DATA:  Fever. EXAM: PORTABLE CHEST 1 VIEW COMPARISON:  None. FINDINGS: Airspace opacity at the right base concerning for pneumonia given the history. Right upper extremity PICC, tip at the SVC level. Normal heart size and aortic contours for technique and mild left  rotation. Remote posterior right sixth rib fracture. IMPRESSION: Right base opacity concerning for pneumonia. Electronically Signed   By: Monte Fantasia M.D.   On: 09/21/2015 20:47    Microbiology: Recent Results (from the past 240 hour(s))  Culture, blood (routine x 2)     Status: None   Collection Time: 09/29/15  3:21 PM  Result Value Ref Range Status   Specimen Description BLOOD RIGHT HAND  Final   Special Requests BOTTLES DRAWN AEROBIC ONLY 5CC  Final   Culture NO GROWTH 5 DAYS  Final   Report Status 10/04/2015 FINAL  Final  Culture, blood (routine x 2)     Status: None   Collection Time: 09/29/15  4:20 PM  Result Value Ref Range Status   Specimen Description BLOOD RIGHT HAND  Final   Special Requests IN PEDIATRIC BOTTLE 3CC  Final   Culture  Setup Time   Final    GRAM POSITIVE COCCI IN CLUSTERS AEROBIC BOTTLE ONLY CRITICAL RESULT CALLED TO, READ BACK BY AND VERIFIED WITH: [facility] $[email or web address]  10/01/15 MKELLY    Culture METHICILLIN RESISTANT STAPHYLOCOCCUS AUREUS  Final   Report Status 10/03/2015 FINAL  Final   Organism ID, Bacteria METHICILLIN RESISTANT STAPHYLOCOCCUS AUREUS  Final      Susceptibility   Methicillin resistant staphylococcus aureus - MIC*    CIPROFLOXACIN >=8 RESISTANT Resistant     ERYTHROMYCIN >=8 RESISTANT Resistant     GENTAMICIN <=0.5 SENSITIVE Sensitive     OXACILLIN >=4 RESISTANT Resistant     TETRACYCLINE <=1 SENSITIVE Sensitive     VANCOMYCIN 1 SENSITIVE Sensitive     TRIMETH/SULFA >=320 RESISTANT Resistant     CLINDAMYCIN <=0.25 SENSITIVE Sensitive     RIFAMPIN <=0.5 SENSITIVE Sensitive     Inducible Clindamycin NEGATIVE Sensitive     * METHICILLIN RESISTANT STAPHYLOCOCCUS AUREUS  Urine culture     Status: None   Collection Time: 09/29/15  4:46 PM  Result Value Ref Range Status   Specimen Description URINE, CATHETERIZED  Final   Special Requests NONE  Final   Culture MULTIPLE SPECIES PRESENT, SUGGEST RECOLLECTION  Final   Report Status 10/01/2015  FINAL  Final  Culture, blood (routine x 2)     Status: None   Collection Time: 10/01/15  4:25 PM  Result Value Ref Range Status   Specimen Description BLOOD RIGHT ARM  Final   Special Requests BOTTLES  DRAWN AEROBIC ONLY 5CC  Final   Culture NO GROWTH 5 DAYS  Final   Report Status 10/06/2015 FINAL  Final  Culture, blood (routine x 2)     Status: None   Collection Time: 10/01/15  4:32 PM  Result Value Ref Range Status   Specimen Description BLOOD RIGHT HAND  Final   Special Requests BOTTLES DRAWN AEROBIC ONLY 3CC  Final   Culture NO GROWTH 5 DAYS  Final   Report Status 10/06/2015 FINAL  Final     Labs: Basic Metabolic Panel:  Recent Labs Lab 10/03/15 0605 10/05/15 0944 10/06/15 0640 10/07/15 0445 10/08/15 0415  NA 134* 137 139 134* 138  K 3.3* 3.4* 3.3* 2.7* 3.3*  CL 107 104 105 98* 98*  CO2 21* 21* $Remov'24 28 28  'YJUDZW$ GLUCOSE 158* 164* 132* 119* 115*  BUN 24* $Remov'19 18 15 13  'TscMWs$ CREATININE 1.14* 1.04* 1.09* 1.08* 1.04*  CALCIUM 7.4* 8.0* 8.1* 7.6* 8.0*  PHOS 2.2* 3.4 3.5 3.3 3.0   Liver Function Tests:  Recent Labs Lab 10/03/15 0605 10/05/15 0944 10/06/15 0640 10/07/15 0445 10/08/15 0415  AST  --  20  --   --   --   ALT  --  12*  --   --   --   ALKPHOS  --  91  --   --   --   BILITOT  --  0.4  --   --   --   PROT  --  4.8*  --   --   --   ALBUMIN 1.5* 1.6* 1.7* 1.6* 1.7*   No results for input(s): LIPASE, AMYLASE in the last 168 hours. No results for input(s): AMMONIA in the last 168 hours. CBC:  Recent Labs Lab 10/05/15 0944  WBC 14.6*  HGB 8.5*  HCT 27.3*  MCV 77.8*  PLT 304   Cardiac Enzymes:  Recent Labs Lab 10/05/15 0944  CKTOTAL 15*   BNP: BNP (last 3 results)  Recent Labs  09/26/15 1414  BNP 48.0    ProBNP (last 3 results) No results for input(s): PROBNP in the last 8760 hours.  CBG:  Recent Labs Lab 10/07/15 1202 10/07/15 1727 10/07/15 2141 10/08/15 0740 10/08/15 1129  GLUCAP 117* 115* 105* 106* 115*       Signed:  Brahim Dolman,  Leif Loflin  [facility] 10/08/2015, 4:11 PM

## 2015-10-09 ENCOUNTER — Other Ambulatory Visit: Payer: Self-pay

## 2015-10-10 ENCOUNTER — Other Ambulatory Visit (HOSPITAL_COMMUNITY)
Admission: RE | Admit: 2015-10-10 | Discharge: 2015-10-10 | Disposition: A | Discharge status: Home / Self Care | Payer: Commercial Managed Care - HMO | Source: Skilled Nursing Facility | Attending: Internal Medicine | Admitting: Internal Medicine

## 2015-10-10 ENCOUNTER — Encounter: Payer: Self-pay | Admitting: Internal Medicine

## 2015-10-10 ENCOUNTER — Other Ambulatory Visit: Payer: Self-pay | Admitting: Internal Medicine

## 2015-10-10 ENCOUNTER — Non-Acute Institutional Stay (SKILLED_NURSING_FACILITY): Payer: Commercial Managed Care - HMO | Admitting: Internal Medicine

## 2015-10-10 DIAGNOSIS — G9340 Encephalopathy, unspecified: Secondary | ICD-10-CM

## 2015-10-10 DIAGNOSIS — B9562 Methicillin resistant Staphylococcus aureus infection as the cause of diseases classified elsewhere: Secondary | ICD-10-CM | POA: Insufficient documentation

## 2015-10-10 DIAGNOSIS — M00869 Arthritis due to other bacteria, unspecified knee: Secondary | ICD-10-CM

## 2015-10-10 DIAGNOSIS — I10 Essential (primary) hypertension: Secondary | ICD-10-CM

## 2015-10-10 DIAGNOSIS — R7881 Bacteremia: Secondary | ICD-10-CM

## 2015-10-10 DIAGNOSIS — S78112A Complete traumatic amputation at level between left hip and knee, initial encounter: Secondary | ICD-10-CM

## 2015-10-10 DIAGNOSIS — M009 Pyogenic arthritis, unspecified: Secondary | ICD-10-CM | POA: Diagnosis not present

## 2015-10-10 DIAGNOSIS — Z89612 Acquired absence of left leg above knee: Secondary | ICD-10-CM | POA: Diagnosis not present

## 2015-10-10 LAB — COMPREHENSIVE METABOLIC PANEL
ALT: 14 U/L (ref 14–54)
AST: 24 U/L (ref 15–41)
Albumin: 1.9 g/dL — ABNORMAL LOW (ref 3.5–5.0)
Alkaline Phosphatase: 87 U/L (ref 38–126)
Anion gap: 8 (ref 5–15)
BUN: 12 mg/dL (ref 6–20)
CHLORIDE: 99 mmol/L — AB (ref 101–111)
CO2: 29 mmol/L (ref 22–32)
CREATININE: 1.02 mg/dL — AB (ref 0.44–1.00)
Calcium: 8 mg/dL — ABNORMAL LOW (ref 8.9–10.3)
GFR, EST NON AFRICAN AMERICAN: 53 mL/min — AB (ref >60)
Glucose, Bld: 108 mg/dL — ABNORMAL HIGH (ref 65–99)
POTASSIUM: 4 mmol/L (ref 3.5–5.1)
Sodium: 136 mmol/L (ref 135–145)
TOTAL PROTEIN: 5.3 g/dL — AB (ref 6.5–8.1)
Total Bilirubin: 0.7 mg/dL (ref 0.3–1.2)

## 2015-10-10 LAB — CBC WITH DIFFERENTIAL/PLATELET
BASOS ABS: 0.1 10*3/uL (ref 0.0–0.1)
Basophils Relative: 1 %
EOS ABS: 2.6 10*3/uL — AB (ref 0.0–0.7)
Eosinophils Relative: 22 %
HCT: 23 % — ABNORMAL LOW (ref 36.0–46.0)
HEMOGLOBIN: 7.4 g/dL — AB (ref 12.0–15.0)
LYMPHS ABS: 2.3 10*3/uL (ref 0.7–4.0)
LYMPHS PCT: 19 %
MCH: 25.2 pg — ABNORMAL LOW (ref 26.0–34.0)
MCHC: 32.2 g/dL (ref 30.0–36.0)
MCV: 78.2 fL (ref 78.0–100.0)
Monocytes Absolute: 1 10*3/uL (ref 0.1–1.0)
Monocytes Relative: 9 %
NEUTROS PCT: 49 %
Neutro Abs: 5.8 10*3/uL (ref 1.7–7.7)
PLATELETS: 293 10*3/uL (ref 150–400)
RBC: 2.94 MIL/uL — AB (ref 3.87–5.11)
RDW: 19.8 % — AB (ref 11.5–15.5)
WBC: 11.8 10*3/uL — AB (ref 4.0–10.5)

## 2015-10-10 NOTE — Progress Notes (Signed)
Patient ID: Lindsay Cobb, female   DOB: 07-31-1941, 74 y.o.   MRN: [id number]   This is an acute visit.  Level of care skilled.  Facility [facility].  Chief complaint-acute visit status post hospitalization for pneumonia-left knee osteomyelitis status post left AKA.  History of present illness.  Patient is a 74 year old female who was discharged from the hospital on October 11  after undergoing incision and drainage of her left knee-wound cultures at that time indicated MRSA she was discharged with a PICC line on vancomycin.  She subsequently returned to the hospital with fevers and questionable pneumonia she was continued on vancomycin and started on Zosyn.  Blood cultures were positive for staph aureus.  Was thought  the left knee was the source of her bacteremia PICC line was removed. Vancomycin was discontinued in favor of daptomycin secondary to her rising creatinine.  She underwent subsequently a left AKA.  She is here for rehabilitation and  longer term IV antibiotic.  She will continue on IV daptomycin until 11/25/2015.  Of note patient did clear her bacteremia on October 26 blood cultures were negative for 5 days.  She did have acute encephalopathy this was thought secondary to hyperammonia and medications-lactulose was added.  Apparently this improved.  In regards to renal insufficiency her creatinine has been trending down it was as high as 2.2 on lab today was 1.02  Patient also had hypotension and her Benazapril atenolol and Norvasc--they have been restarted  blood pressure appears to be stable most recently 109/56-144/57 in this range  Patient also is a type II diabetic she was resumed on her Glucophage upon discharge-blood sugars    appear to be stable on the lower 100s  Previous medical history.  Healthcare associated pneumonia.  Type 2 diabetes.  Hypertension.  Obesity.  Microcytic anemia.  Left buttocks decubitus ulcer  unstageable.  Bacterial infection of the knee joint.  MRSA bacteremia.  Subacute osteomyelitis of her right tibia.  Acute kidney injury.  Chronic osteomyelitis right femur.   sSocial history no smoking history alcohol or illicit drug use.  Family history positive for hypertension and diabetes in her mother.  Hypertension in her father  . Medications.  Daptomycin 900 mg IV daily.  Lactulose 20 mg twice a day.  Potassium 40 mEq twice a day.  Lasix 40 mg daily.  Aspirin enteric-coated 325 mg daily.  Norvasc 10 mg daily.  Atenolol 50 mg daily.   Lotensin 20 mg daily.  Ferrous sulfate 325 mg daily.  Tofranil 50 mg 3 times a day.  Januvia 75 mg daily.  Lovastatin 10 mg daily at bedtime.  Glucophage thousand milligrams twice a day.  OxyIR 5 mg every 4 hours when necessary pain.  Review of systems.  In general does not complain of any fever or chills.  Skin does have status post left AKA and also apparently superficial buttock   ulcer--per nursing staff this is stable  Eyes does not complain of any visual changes.  Ear nose mouth and throat does not complaining of sore throat or nasal discharge. does not complaining of shortness breath or cough.  Respiratory does not complain of shortness of breath or cough  Cardiac does not complain of chest pain.  GI does not complain of nausea or vomiting she is on lactulose for elevated ammonia level is not complaining of unremitting diarrhea however.  GU does not complain of dysuria.  Musculoskeletal extensive history as noted above but is not complaining of joint pain currently.  Neurologic does not complain of dizziness headache or syncopal-type feelings.  Psych is not complaining of depression or anxiety currently.  Physical exam.  Temperature 98.8 pulse 80 respirations 20 blood pressure 109/56.  In general this is a pleasant elderly female in no distress.  Her skin is warm and dry-PICC line is placed left  upper arm this appears to be stable she has dressing over her left stump site per nursing this looks very stable she also has ulcers her back/buttock  which per nursing are superficial  Eyes she has prescription lenses pupils appear reactive to light visual acuity appears grossly intact.  Oropharynx is clear she is largely edentulous.  Chest is clear to auscultation there is no labored breathing.  Heart is regular rate and rhythm without murmur gallop or rub.  Muscle skeletal is status post left AKA again this is stable per nursing otherwise moves her extremities at baseline age appropriate degenerative changes diffuse. . Abdomen min is obese soft nontender with positive bowel sounds.  Neurologic is grossly intact her speech is clear.  Psych she is oriented to self place her home address.  Labs.  10/10/2015.  Sodium 136 potassium 4.0 BUN 12 creatinine 1.02.  Albumin 1.9 otherwise liver function tests within normal limits.  WBC 11.8 hemoglobin 7.4 platelets 293.  Assessment and plan.   History of sepsis with staph aureus bacteremia wound dehiscent's extensive left knee osteomyelitis-she is completing a course of long-term IV back to myosin-she is status post AKA on October 23-at this point appears to be stable infectious diseases monitoring her lab work.  #2 acute encephalopathy she is on lactulose mental status appears to be improving this will have to be monitored as well as her bowel movements.  #3 acute kidney injury this appears to be resolving with a creatinine 1.02 today again infectious diseases monitoring her labs but this appears to be improving and stable.  #4 history of hypertension this appears to be relatively stable she is back on her Lotensin Norvasc as well as atenolol  #5-- History of microcytic anemia she is on iron--hemoglobin ha gone down a bit from 8.7-infectious disease is monitoring this will warrant updated labs clinically she appears to be stable her  white count is going down at 11.8.     #6 history of diabetes she continues on Glucophage as well as Januvia--- blood sugars appear to be stable in the lower mid 100s at this point continue to monitor  [id number]-of note greater than 40 minutes spent assessing patient-reviewing her chart-and coordinating and formulating a plan of care for numerous diagnoses-of note greater than 50% of time spent coordinating plan of care.

## 2015-10-12 ENCOUNTER — Non-Acute Institutional Stay (SKILLED_NURSING_FACILITY): Payer: Commercial Managed Care - HMO | Admitting: Internal Medicine

## 2015-10-12 DIAGNOSIS — S88012S Complete traumatic amputation at knee level, left lower leg, sequela: Secondary | ICD-10-CM

## 2015-10-12 DIAGNOSIS — A4902 Methicillin resistant Staphylococcus aureus infection, unspecified site: Secondary | ICD-10-CM | POA: Diagnosis not present

## 2015-10-12 DIAGNOSIS — E0821 Diabetes mellitus due to underlying condition with diabetic nephropathy: Secondary | ICD-10-CM | POA: Diagnosis not present

## 2015-10-12 DIAGNOSIS — I10 Essential (primary) hypertension: Secondary | ICD-10-CM

## 2015-10-12 DIAGNOSIS — S78112S Complete traumatic amputation at level between left hip and knee, sequela: Secondary | ICD-10-CM

## 2015-10-12 NOTE — Progress Notes (Signed)
Patient ID: Lindsay Cobb, female   DOB: [birth year], 74 y.o.   MRN: [id number]  Facility; [facility] Chief complaint; admission to SNF post admit to [facility] from 10/16 to 10/08/2015  History;  this is a patient I had seen one time previously after a stay at [facility] from 10/5 through 10/11. Patient had a wound infection after suffering a patella fracture in July of this year. Cultures of the wound growing MRSA. She had had several intraoperative debridement setting came here with a wound VAC on the left knee and IV vancomycin. She apparently developed acute fever and was transferred back to hospital. She was discovered to have healthcare acquired pneumonia. Blood cultures grew staph aureus which was methicillin-resistant. Echocardiogram showed an EF of 65-70% without wall motion abnormality or vegetation. She developed rising creatinine and therefore was changed to daptomycin. 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 Repeat blood cultures from 10/24 showed still positive for MRSA however subsequent blood cultures were negative for. A TEE on 10/27 did not show any vegetations. She developed postoperative delirium and was noted to have hyperammonemia lactulose was added. Her creatinine got as high as 2.2 however was 1.14 at baseline a fractional secretion of sodium showed 0.1 indicative of prerenal etiologies. The patient was seen by nephrology. Her hypertensive medications were discontinued as she was hypotensive at presentation at discharge her been as a parole atenolol and amlodipine were all restarted.  I have reviewed the infectious disease no tendon deep they wanted another 8 weeks of Cubicin.  BMP Latest Ref Rng 10/10/2015 10/08/2015 10/07/2015  Glucose 65 - 99 mg/dL 782(N) 562(Z) 308(M)  BUN 6 - 20 mg/dL Creatinine 0.44 - 1.00 mg/dL 5.78(I) 6.96(E) 9.52(W)  Sodium 135 - 145 mmol/L 136 138 134(L)  Potassium 3.5 - 5.1 mmol/L 4.0 3.3(L) 2.7(LL)  Chloride 101 - 111 mmol/L 99(L) 98(L)  98(L)  CO2 22 - 32 mmol/L Calcium 8.9 - 10.3 mg/dL 8.0(L) 8.0(L) 7.6(L)   CBC Latest Ref Rng 10/10/2015 10/05/2015 10/01/2015  WBC 4.0 - 10.5 K/uL 11.8(H) 14.6(H) 23.0(H)  Hemoglobin 12.0 - 15.0 g/dL 7.4(L) 8.5(L) 8.7(L)  Hematocrit 36.0 - 46.0 % 23.0(L) 27.3(L) 27.1(L)  Platelets 150 - 400 K/uL 293 304 364      Past Medical History  Diagnosis Date  . Diabetes mellitus     takes Januvia,Metformin,and Glipizide daily  . Hyperlipidemia     takes Lovastatin nightly  . Anemia     takes Ferrous Gluconate daily  . Peripheral edema     takes Furosemide daily as needed  . Depression     takes Tofranil daily  . Hypertension     takes Amlodipine,Atenolol,and Benazepril daily  . Arthritis   . Joint pain   . Joint swelling   . Cataracts, bilateral     immature  . Infection of skin of knee     left    Past Surgical History  Procedure Laterality Date  . Breast biopsy Left   . Colonoscopy    . Esophagogastroduodenoscopy    . Ercp    . Patellectomy Left 07/11/2015    Procedure: Left Partial PATELLECTOMY;  Surgeon: Tarry Kos, MD;  Location: [facility] OR;  Service: Orthopedics;  Laterality: Left;  . Irrigation and debridement knee Left 08/15/2015    Procedure: IRRIGATION AND DEBRIDEMENT LEFT KNEE;  Surgeon: Tarry Kos, MD;  Location: [facility] OR;  Service: Orthopedics;  Laterality: Left;  . Patellectomy Left 08/15/2015  Procedure: Revision PATELLECTOMY;  Surgeon: Tarry Kos, MD;  Location: [facility];  Service: Orthopedics;  Laterality: Left;  . I&d extremity Left 08/18/2015    Procedure: IRRIGATION AND DEBRIDEMENT EXTREMITY;  Surgeon: Tarry Kos, MD;  Location: [facility];  Service: Orthopedics;  Laterality: Left;  Marland Kitchen Minor application of wound vac  08/18/2015    Procedure: MINOR APPLICATION OF WOUND VAC;  Surgeon: Tarry Kos, MD;  Location: [facility];  Service: Orthopedics;;  . Irrigation and debridement knee Left 08/20/2015    Procedure: IRRIGATION AND DEBRIDEMENT KNEE WITH PLACEMENT OF  INTEGRA;  Surgeon: Tarry Kos, MD;  Location: [facility];  Service: Orthopedics;  Laterality: Left;  . I&d extremity Left 09/10/2015    Procedure: LEFT KNEE SPLIT THICKNESS SKIN GRAFT, IRRIGATION AND DEBRIDEMENT, WOUND VAC;  Surgeon: Tarry Kos, MD;  Location: [facility];  Service: Orthopedics;  Laterality: Left;  . Skin split graft Left 09/10/2015    Procedure: SKIN GRAFT SPLIT THICKNESS;  Surgeon: Tarry Kos, MD;  [facility]: [facility];  Service: Orthopedics;  Laterality: Left;  . Application of wound vac Left 09/10/2015    Procedure: APPLICATION OF WOUND VAC;  Surgeon: Tarry Kos, MD;  [facility];  Service: Orthopedics;  Laterality: Left;  . I&d extremity Left 09/12/2015    Procedure: IRRIGATION AND DEBRIDEMENT LEFT KNEE WITH PLACEMENT OF WOUND VAC;  Surgeon: Tarry Kos, MD;  Location: [facility];  Service: Orthopedics;  Laterality: Left;  . Amputation Left 09/28/2015    Procedure: AMPUTATION ABOVE KNEE;  Surgeon: Nadara Mustard, MD;  Location: [facility];  Service: Orthopedics;  Laterality: Left;  . Tee without cardioversion N/A 10/02/2015    Procedure: TRANSESOPHAGEAL ECHOCARDIOGRAM (TEE);  Surgeon: Chilton Si, MD;  Location: [facility] ENDOSCOPY;  Service: Cardiovascular;  Laterality: N/A;    Current Outpatient Prescriptions on File Prior to Visit  Medication Sig Dispense Refill  . amLODipine (NORVASC) 10 MG tablet TAKE 1 TABLET EVERY DAY 90 tablet 1  . aspirin EC 325 MG tablet Take 1 tablet (325 mg total) by mouth daily. 84 tablet 0  . atenolol (TENORMIN) 50 MG tablet Take 1 tablet (50 mg total) by mouth daily. 90 tablet 1  . benazepril (LOTENSIN) 20 MG tablet TAKE 1 TABLET EVERY DAY 90 tablet 1  . DAPTOmycin 900 mg in sodium chloride 0.9 % 100 mL Inject 900 mg into the vein daily. 1 g 0  . ferrous sulfate 325 (65 FE) MG tablet Take 325 mg by mouth daily with breakfast.   0  . furosemide (LASIX) 40 MG tablet Take 1 tablet (40 mg total) by mouth daily. 30 tablet 0  . glucose blood (ACCU-CHEK AVIVA) test  strip Use to monitor FSBS 1x daily. Dx: E11.9 50 each 6  . imipramine (TOFRANIL) 50 MG tablet Take 1 tablet (50 mg total) by mouth 3 (three) times daily. 270 tablet 1  . JANUVIA 25 MG tablet TAKE ONE TABLET BY MOUTH ONCE DAILY (Patient taking differently: TAKE ONE TABLET BY MOUTH ONCE DAILY WITH BREAKFAST) 30 tablet 3  . lactulose (CHRONULAC) 10 GM/15ML solution Take 30 mLs (20 g total) by mouth 2 (two) times daily. 240 mL 0  . lovastatin (MEVACOR) 10 MG tablet Take 1 tablet (10 mg total) by mouth at bedtime. 90 tablet 2  . metFORMIN (GLUCOPHAGE) 1000 MG tablet TAKE 1 TABLET TWICE DAILY WITH A MEAL (Patient taking differently: Take 1,000 mg by mouth 2 (two) times daily with a meal. ) 180 tablet  1  . oxyCODONE (OXY IR/ROXICODONE) 5 MG immediate release tablet Take 1-3 tablets (5-15 mg total) by mouth every 4 (four) hours as needed. (Patient taking differently: Take 10 mg by mouth every 4 (four) hours as needed for moderate pain or severe pain. ) 180 tablet 0  . potassium chloride SA (K-DUR,KLOR-CON) 20 MEQ tablet Take 2 tablets (40 mEq total) by mouth 2 (two) times daily. 10 tablet 0    Socially the patient lives in [place] with her husband. Was previously independent.  reports that she has never smoked. She has never used smokeless tobacco. She reports that she does not drink alcohol or use illicit drugs.  indicated that her mother is deceased. She indicated that her father is deceased.   Review of systems Gen. no fever no chills Respiratory; no cough no sputum Cardiac no chest pain or palpitations GI no abdominal pain nausea vomiting or diarrhea GU no dysuria; no Extremities no pain. Skin several superficial stage pressure ulcers according to the nursing staff.  Physical examination Gen. the patient does not look to be in any distress no fever no chills Respiratory; clear entry bilaterally Cardiac heart sounds are normal no murmurs no gallops Abdomen; no liver no spleen no tenderness  no masses GU bladder is not distended there is no CVA tenderness. Skin; several superficial stage II pressure areas over both buttocks and the right scapula. These do not look ominous. 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 She has had a curative left above-knee amputation in the mid  [place]em[place]. She is here for 8 weeks of Cubicin which will need to be carefully followed #2 acute renal failure this seems to be resolving. #3 several superficial stage decubitus ulcers on her back all with foam-based dressing and pressure relief. #4 anemia; it would appear that her hemoglobin is continuing to slip this will need to be monitored. Last was 7.4 on 11/4 #5 significant hypokalemia which was replaced. She is still on potassium and Lasix I will check this tomorrow as well. #6 postoperative delirium. The usefulness of an ammonia level in this setting is unclear to me. I don't know that she will need to continue on lactulose. #7 Cubicin she has had weekly lab work ordered including a CK has saw her CRP Chem-7 and CBC and differential. Last sedimentation rate was 23, C-reactive protein of 11.1 also on 10/23 last CK was 15 on 10/3 #8 type 2 diabetes on oral agents; requests to monitor her blood sugar for use of insulin. Last hemoglobin A1c was 7.1 on 10/23  The patient for the moment appears to be stable. I will follow her up later this week. Lab work to infectious disease as requested

## 2015-10-13 ENCOUNTER — Other Ambulatory Visit (HOSPITAL_COMMUNITY)
Admission: AD | Admit: 2015-10-13 | Discharge: 2015-10-13 | Disposition: A | Discharge status: Home / Self Care | Payer: Commercial Managed Care - HMO | Source: Skilled Nursing Facility | Attending: Internal Medicine | Admitting: Internal Medicine

## 2015-10-13 ENCOUNTER — Non-Acute Institutional Stay (SKILLED_NURSING_FACILITY): Payer: Commercial Managed Care - HMO | Admitting: Internal Medicine

## 2015-10-13 ENCOUNTER — Encounter: Payer: Self-pay | Admitting: Licensed Clinical Social Worker

## 2015-10-13 DIAGNOSIS — L89320 Pressure ulcer of left buttock, unstageable: Secondary | ICD-10-CM | POA: Diagnosis not present

## 2015-10-13 DIAGNOSIS — B9562 Methicillin resistant Staphylococcus aureus infection as the cause of diseases classified elsewhere: Secondary | ICD-10-CM | POA: Diagnosis present

## 2015-10-13 DIAGNOSIS — L03317 Cellulitis of buttock: Secondary | ICD-10-CM

## 2015-10-13 DIAGNOSIS — A4902 Methicillin resistant Staphylococcus aureus infection, unspecified site: Secondary | ICD-10-CM | POA: Diagnosis not present

## 2015-10-13 DIAGNOSIS — R8279 Other abnormal findings on microbiological examination of urine: Secondary | ICD-10-CM | POA: Diagnosis present

## 2015-10-13 LAB — CBC WITH DIFFERENTIAL/PLATELET
BASOS ABS: 0.1 10*3/uL (ref 0.0–0.1)
Basophils Relative: 0 %
EOS PCT: 7 %
Eosinophils Absolute: 1 10*3/uL — ABNORMAL HIGH (ref 0.0–0.7)
HCT: 24.9 % — ABNORMAL LOW (ref 36.0–46.0)
HEMOGLOBIN: 7.9 g/dL — AB (ref 12.0–15.0)
LYMPHS ABS: 1.6 10*3/uL (ref 0.7–4.0)
LYMPHS PCT: 11 %
MCH: 25 pg — AB (ref 26.0–34.0)
MCHC: 31.7 g/dL (ref 30.0–36.0)
MCV: 78.8 fL (ref 78.0–100.0)
Monocytes Absolute: 2 10*3/uL — ABNORMAL HIGH (ref 0.1–1.0)
Monocytes Relative: 14 %
NEUTROS PCT: 68 %
Neutro Abs: 9.7 10*3/uL — ABNORMAL HIGH (ref 1.7–7.7)
PLATELETS: 451 10*3/uL — AB (ref 150–400)
RBC: 3.16 MIL/uL — AB (ref 3.87–5.11)
RDW: 19.9 % — ABNORMAL HIGH (ref 11.5–15.5)
WBC: 14.2 10*3/uL — AB (ref 4.0–10.5)

## 2015-10-13 LAB — COMPREHENSIVE METABOLIC PANEL
ALK PHOS: 89 U/L (ref 38–126)
ALT: 14 U/L (ref 14–54)
AST: 20 U/L (ref 15–41)
Albumin: 2.1 g/dL — ABNORMAL LOW (ref 3.5–5.0)
Anion gap: 9 (ref 5–15)
BUN: 16 mg/dL (ref 6–20)
CALCIUM: 8.2 mg/dL — AB (ref 8.9–10.3)
CHLORIDE: 95 mmol/L — AB (ref 101–111)
CO2: 25 mmol/L (ref 22–32)
CREATININE: 1.38 mg/dL — AB (ref 0.44–1.00)
GFR, EST AFRICAN AMERICAN: 43 mL/min — AB (ref >60)
GFR, EST NON AFRICAN AMERICAN: 37 mL/min — AB (ref >60)
Glucose, Bld: 157 mg/dL — ABNORMAL HIGH (ref 65–99)
Potassium: 5 mmol/L (ref 3.5–5.1)
SODIUM: 129 mmol/L — AB (ref 135–145)
Total Bilirubin: 0.5 mg/dL (ref 0.3–1.2)
Total Protein: 6.7 g/dL (ref 6.5–8.1)

## 2015-10-13 LAB — SEDIMENTATION RATE: Sed Rate: 77 mm/hr — ABNORMAL HIGH (ref 0–22)

## 2015-10-13 LAB — C-REACTIVE PROTEIN: CRP: 18.4 mg/dL — AB (ref <1.0)

## 2015-10-13 LAB — CK TOTAL AND CKMB (NOT AT ARMC)
CK, MB: 1.6 ng/mL (ref 0.5–5.0)
Relative Index: INVALID (ref 0.0–2.5)
Total CK: 25 U/L — ABNORMAL LOW (ref 38–234)

## 2015-10-14 ENCOUNTER — Inpatient Hospital Stay (HOSPITAL_COMMUNITY): Payer: Commercial Managed Care - HMO

## 2015-10-14 ENCOUNTER — Emergency Department (HOSPITAL_COMMUNITY): Payer: Commercial Managed Care - HMO

## 2015-10-14 ENCOUNTER — Encounter (HOSPITAL_COMMUNITY): Payer: Self-pay | Admitting: Emergency Medicine

## 2015-10-14 ENCOUNTER — Other Ambulatory Visit: Payer: Self-pay | Admitting: Licensed Clinical Social Worker

## 2015-10-14 ENCOUNTER — Inpatient Hospital Stay (HOSPITAL_COMMUNITY)
Admission: EM | Admit: 2015-10-14 | Discharge: 2015-10-24 | DRG: 570 | Disposition: A | Discharge status: Skilled Nursing Facility | Payer: Commercial Managed Care - HMO | Attending: Internal Medicine | Admitting: Internal Medicine

## 2015-10-14 DIAGNOSIS — Z7401 Bed confinement status: Secondary | ICD-10-CM

## 2015-10-14 DIAGNOSIS — N390 Urinary tract infection, site not specified: Secondary | ICD-10-CM | POA: Diagnosis present

## 2015-10-14 DIAGNOSIS — Z89619 Acquired absence of unspecified leg above knee: Secondary | ICD-10-CM

## 2015-10-14 DIAGNOSIS — Z7984 Long term (current) use of oral hypoglycemic drugs: Secondary | ICD-10-CM | POA: Diagnosis not present

## 2015-10-14 DIAGNOSIS — E669 Obesity, unspecified: Secondary | ICD-10-CM | POA: Diagnosis present

## 2015-10-14 DIAGNOSIS — E1121 Type 2 diabetes mellitus with diabetic nephropathy: Secondary | ICD-10-CM

## 2015-10-14 DIAGNOSIS — L309 Dermatitis, unspecified: Secondary | ICD-10-CM | POA: Diagnosis present

## 2015-10-14 DIAGNOSIS — D72829 Elevated white blood cell count, unspecified: Secondary | ICD-10-CM | POA: Diagnosis not present

## 2015-10-14 DIAGNOSIS — B9620 Unspecified Escherichia coli [E. coli] as the cause of diseases classified elsewhere: Secondary | ICD-10-CM | POA: Diagnosis not present

## 2015-10-14 DIAGNOSIS — F329 Major depressive disorder, single episode, unspecified: Secondary | ICD-10-CM | POA: Diagnosis present

## 2015-10-14 DIAGNOSIS — E119 Type 2 diabetes mellitus without complications: Secondary | ICD-10-CM | POA: Diagnosis present

## 2015-10-14 DIAGNOSIS — Z79899 Other long term (current) drug therapy: Secondary | ICD-10-CM | POA: Diagnosis not present

## 2015-10-14 DIAGNOSIS — L89312 Pressure ulcer of right buttock, stage 2: Secondary | ICD-10-CM | POA: Diagnosis present

## 2015-10-14 DIAGNOSIS — I10 Essential (primary) hypertension: Secondary | ICD-10-CM | POA: Diagnosis present

## 2015-10-14 DIAGNOSIS — R339 Retention of urine, unspecified: Secondary | ICD-10-CM | POA: Diagnosis not present

## 2015-10-14 DIAGNOSIS — L89320 Pressure ulcer of left buttock, unstageable: Secondary | ICD-10-CM | POA: Diagnosis not present

## 2015-10-14 DIAGNOSIS — E11628 Type 2 diabetes mellitus with other skin complications: Secondary | ICD-10-CM

## 2015-10-14 DIAGNOSIS — N179 Acute kidney failure, unspecified: Secondary | ICD-10-CM | POA: Diagnosis present

## 2015-10-14 DIAGNOSIS — F419 Anxiety disorder, unspecified: Secondary | ICD-10-CM | POA: Diagnosis present

## 2015-10-14 DIAGNOSIS — E871 Hypo-osmolality and hyponatremia: Secondary | ICD-10-CM | POA: Diagnosis present

## 2015-10-14 DIAGNOSIS — D638 Anemia in other chronic diseases classified elsewhere: Secondary | ICD-10-CM | POA: Diagnosis present

## 2015-10-14 DIAGNOSIS — Z7902 Long term (current) use of antithrombotics/antiplatelets: Secondary | ICD-10-CM

## 2015-10-14 DIAGNOSIS — Z7982 Long term (current) use of aspirin: Secondary | ICD-10-CM | POA: Diagnosis not present

## 2015-10-14 DIAGNOSIS — Z8614 Personal history of Methicillin resistant Staphylococcus aureus infection: Secondary | ICD-10-CM | POA: Diagnosis not present

## 2015-10-14 DIAGNOSIS — L89329 Pressure ulcer of left buttock, unspecified stage: Secondary | ICD-10-CM | POA: Diagnosis not present

## 2015-10-14 DIAGNOSIS — Z794 Long term (current) use of insulin: Secondary | ICD-10-CM | POA: Diagnosis not present

## 2015-10-14 DIAGNOSIS — Z8619 Personal history of other infectious and parasitic diseases: Secondary | ICD-10-CM | POA: Diagnosis not present

## 2015-10-14 DIAGNOSIS — L8994 Pressure ulcer of unspecified site, stage 4: Secondary | ICD-10-CM | POA: Diagnosis not present

## 2015-10-14 DIAGNOSIS — L89324 Pressure ulcer of left buttock, stage 4: Principal | ICD-10-CM | POA: Diagnosis present

## 2015-10-14 DIAGNOSIS — R234 Changes in skin texture: Secondary | ICD-10-CM | POA: Diagnosis present

## 2015-10-14 DIAGNOSIS — Z89612 Acquired absence of left leg above knee: Secondary | ICD-10-CM | POA: Diagnosis not present

## 2015-10-14 DIAGNOSIS — L8990 Pressure ulcer of unspecified site, unspecified stage: Secondary | ICD-10-CM | POA: Diagnosis not present

## 2015-10-14 DIAGNOSIS — Z6829 Body mass index (BMI) 29.0-29.9, adult: Secondary | ICD-10-CM

## 2015-10-14 DIAGNOSIS — K219 Gastro-esophageal reflux disease without esophagitis: Secondary | ICD-10-CM | POA: Diagnosis present

## 2015-10-14 DIAGNOSIS — K5900 Constipation, unspecified: Secondary | ICD-10-CM | POA: Diagnosis present

## 2015-10-14 DIAGNOSIS — D509 Iron deficiency anemia, unspecified: Secondary | ICD-10-CM | POA: Diagnosis present

## 2015-10-14 DIAGNOSIS — R700 Elevated erythrocyte sedimentation rate: Secondary | ICD-10-CM | POA: Diagnosis not present

## 2015-10-14 DIAGNOSIS — E785 Hyperlipidemia, unspecified: Secondary | ICD-10-CM | POA: Diagnosis present

## 2015-10-14 DIAGNOSIS — R7982 Elevated C-reactive protein (CRP): Secondary | ICD-10-CM | POA: Diagnosis not present

## 2015-10-14 HISTORY — DX: Pressure ulcer of unspecified buttock, unstageable: L89.300

## 2015-10-14 LAB — CBC WITH DIFFERENTIAL/PLATELET
BASOS ABS: 0.1 10*3/uL (ref 0.0–0.1)
Basophils Relative: 1 %
EOS ABS: 0.8 10*3/uL — AB (ref 0.0–0.7)
EOS PCT: 6 %
HCT: 26 % — ABNORMAL LOW (ref 36.0–46.0)
Hemoglobin: 8.2 g/dL — ABNORMAL LOW (ref 12.0–15.0)
LYMPHS ABS: 1.5 10*3/uL (ref 0.7–4.0)
LYMPHS PCT: 12 %
MCH: 24.3 pg — AB (ref 26.0–34.0)
MCHC: 31.5 g/dL (ref 30.0–36.0)
MCV: 76.9 fL — AB (ref 78.0–100.0)
MONO ABS: 2 10*3/uL — AB (ref 0.1–1.0)
Monocytes Relative: 16 %
Neutro Abs: 8.3 10*3/uL — ABNORMAL HIGH (ref 1.7–7.7)
Neutrophils Relative %: 65 %
PLATELETS: 454 10*3/uL — AB (ref 150–400)
RBC: 3.38 MIL/uL — AB (ref 3.87–5.11)
RDW: 19.7 % — AB (ref 11.5–15.5)
WBC: 12.6 10*3/uL — AB (ref 4.0–10.5)

## 2015-10-14 LAB — COMPREHENSIVE METABOLIC PANEL
ALBUMIN: 2 g/dL — AB (ref 3.5–5.0)
ALT: 13 U/L — AB (ref 14–54)
AST: 19 U/L (ref 15–41)
Alkaline Phosphatase: 93 U/L (ref 38–126)
Anion gap: 10 (ref 5–15)
BILIRUBIN TOTAL: 0.6 mg/dL (ref 0.3–1.2)
BUN: 14 mg/dL (ref 6–20)
CO2: 25 mmol/L (ref 22–32)
CREATININE: 1.52 mg/dL — AB (ref 0.44–1.00)
Calcium: 8.3 mg/dL — ABNORMAL LOW (ref 8.9–10.3)
Chloride: 92 mmol/L — ABNORMAL LOW (ref 101–111)
GFR calc Af Amer: 38 mL/min — ABNORMAL LOW (ref >60)
GFR, EST NON AFRICAN AMERICAN: 33 mL/min — AB (ref >60)
GLUCOSE: 131 mg/dL — AB (ref 65–99)
POTASSIUM: 4.2 mmol/L (ref 3.5–5.1)
Sodium: 127 mmol/L — ABNORMAL LOW (ref 135–145)
TOTAL PROTEIN: 6.7 g/dL (ref 6.5–8.1)

## 2015-10-14 LAB — URINE MICROSCOPIC-ADD ON

## 2015-10-14 LAB — URINALYSIS, ROUTINE W REFLEX MICROSCOPIC
BILIRUBIN URINE: NEGATIVE
GLUCOSE, UA: NEGATIVE mg/dL
KETONES UR: NEGATIVE mg/dL
Nitrite: POSITIVE — AB
PH: 7 (ref 5.0–8.0)
PROTEIN: NEGATIVE mg/dL
Specific Gravity, Urine: 1.01 (ref 1.005–1.030)
Urobilinogen, UA: 0.2 mg/dL (ref 0.0–1.0)

## 2015-10-14 LAB — I-STAT CG4 LACTIC ACID, ED
Lactic Acid, Venous: 2.26 mmol/L (ref 0.5–2.0)
Lactic Acid, Venous: 1.33 mmol/L (ref 0.5–2.0)

## 2015-10-14 MED ORDER — HEPARIN SODIUM (PORCINE) 5000 UNIT/ML IJ SOLN
5000.0000 [IU] | Freq: Three times a day (TID) | INTRAMUSCULAR | Status: DC
  Administered 2015-10-14 – 2015-10-24 (×27): 5000 [IU] via SUBCUTANEOUS
  Filled 2015-10-14 (×29): qty 1

## 2015-10-14 MED ORDER — ATORVASTATIN CALCIUM 80 MG PO TABS
80.0000 mg | ORAL_TABLET | Freq: Every day | ORAL | Status: DC
  Administered 2015-10-15: 80 mg via ORAL

## 2015-10-14 MED ORDER — FLEET ENEMA 7-19 GM/118ML RE ENEM: 1.0000 | ENEMA | Freq: Once | RECTAL | Status: DC | PRN

## 2015-10-14 MED ORDER — SODIUM CHLORIDE 0.9 % IV SOLN
1000.0000 mL | INTRAVENOUS | Status: DC
  Administered 2015-10-14 (×2): 1000 mL via INTRAVENOUS

## 2015-10-14 MED ORDER — PIPERACILLIN-TAZOBACTAM 3.375 G IVPB 30 MIN
3.3750 g | Freq: Once | INTRAVENOUS | Status: AC
  Administered 2015-10-14: 3.375 g via INTRAVENOUS
  Filled 2015-10-14: qty 50

## 2015-10-14 MED ORDER — INSULIN ASPART 100 UNIT/ML ~~LOC~~ SOLN
0.0000 [IU] | Freq: Three times a day (TID) | SUBCUTANEOUS | Status: DC
  Administered 2015-10-15: 2 [IU] via SUBCUTANEOUS
  Administered 2015-10-15 – 2015-10-24 (×9): 1 [IU] via SUBCUTANEOUS

## 2015-10-14 MED ORDER — IPRATROPIUM-ALBUTEROL 0.5-2.5 (3) MG/3ML IN SOLN: 3.0000 mL | Freq: Four times a day (QID) | RESPIRATORY_TRACT | Status: DC | PRN

## 2015-10-14 MED ORDER — BENZTROPINE MESYLATE 0.5 MG PO TABS
0.5000 mg | ORAL_TABLET | Freq: Every day | ORAL | Status: DC
Start: 2015-10-15 — End: 2015-10-15
  Filled 2015-10-14: qty 1

## 2015-10-14 MED ORDER — PIPERACILLIN-TAZOBACTAM 3.375 G IVPB
3.3750 g | Freq: Three times a day (TID) | INTRAVENOUS | Status: DC
  Administered 2015-10-15 (×2): 3.375 g via INTRAVENOUS
  Filled 2015-10-14 (×3): qty 50

## 2015-10-14 MED ORDER — ARFORMOTEROL TARTRATE 15 MCG/2ML IN NEBU
15.0000 ug | INHALATION_SOLUTION | Freq: Two times a day (BID) | RESPIRATORY_TRACT | Status: DC
  Administered 2015-10-15: 15 ug via RESPIRATORY_TRACT

## 2015-10-14 MED ORDER — SODIUM CHLORIDE 0.9 % IV SOLN
2000.0000 mg | Freq: Once | INTRAVENOUS | Status: AC
  Administered 2015-10-14: 2000 mg via INTRAVENOUS
  Filled 2015-10-14: qty 2000

## 2015-10-14 MED ORDER — CLOPIDOGREL BISULFATE 75 MG PO TABS
75.0000 mg | ORAL_TABLET | Freq: Every day | ORAL | Status: DC
  Administered 2015-10-15: 75 mg via ORAL
  Filled 2015-10-14: qty 1

## 2015-10-14 MED ORDER — DOCUSATE SODIUM 100 MG PO CAPS
100.0000 mg | ORAL_CAPSULE | Freq: Two times a day (BID) | ORAL | Status: DC
  Administered 2015-10-14: 100 mg via ORAL
  Filled 2015-10-14: qty 1

## 2015-10-14 MED ORDER — POLYETHYLENE GLYCOL 3350 17 G PO PACK: 17.0000 g | PACK | Freq: Every day | ORAL | Status: DC

## 2015-10-14 MED ORDER — ALBUTEROL SULFATE (2.5 MG/3ML) 0.083% IN NEBU: 3.0000 mL | INHALATION_SOLUTION | RESPIRATORY_TRACT | Status: DC | PRN

## 2015-10-14 MED ORDER — CLONAZEPAM 1 MG PO TABS
1.0000 mg | ORAL_TABLET | Freq: Two times a day (BID) | ORAL | Status: DC
  Administered 2015-10-14: 1 mg via ORAL
  Filled 2015-10-14: qty 1

## 2015-10-14 MED ORDER — ONDANSETRON HCL 4 MG PO TABS
4.0000 mg | ORAL_TABLET | Freq: Four times a day (QID) | ORAL | Status: DC | PRN
Start: 2015-10-14 — End: 2015-10-24

## 2015-10-14 MED ORDER — MIRTAZAPINE 15 MG PO TABS
15.0000 mg | ORAL_TABLET | Freq: Every day | ORAL | Status: DC
  Administered 2015-10-14: 15 mg via ORAL
  Filled 2015-10-14 (×2): qty 1

## 2015-10-14 MED ORDER — VANCOMYCIN HCL IN DEXTROSE 1-5 GM/200ML-% IV SOLN: 1000.0000 mg | Freq: Once | INTRAVENOUS | Status: DC

## 2015-10-14 MED ORDER — PANTOPRAZOLE SODIUM 40 MG PO TBEC: 40.0000 mg | DELAYED_RELEASE_TABLET | Freq: Every day | ORAL | Status: DC

## 2015-10-14 MED ORDER — SERTRALINE HCL 50 MG PO TABS
50.0000 mg | ORAL_TABLET | Freq: Two times a day (BID) | ORAL | Status: DC
  Administered 2015-10-14: 50 mg via ORAL
  Filled 2015-10-14: qty 1

## 2015-10-14 MED ORDER — QUETIAPINE FUMARATE 100 MG PO TABS
400.0000 mg | ORAL_TABLET | Freq: Every day | ORAL | Status: DC
  Administered 2015-10-14: 400 mg via ORAL
  Filled 2015-10-14: qty 4

## 2015-10-14 MED ORDER — SODIUM CHLORIDE 0.9 % IJ SOLN
3.0000 mL | Freq: Two times a day (BID) | INTRAMUSCULAR | Status: DC
  Administered 2015-10-14 – 2015-10-24 (×9): 3 mL via INTRAVENOUS

## 2015-10-14 MED ORDER — BISACODYL 5 MG PO TBEC: 5.0000 mg | DELAYED_RELEASE_TABLET | Freq: Every day | ORAL | Status: DC | PRN

## 2015-10-14 MED ORDER — PREDNISONE 20 MG PO TABS
20.0000 mg | ORAL_TABLET | Freq: Two times a day (BID) | ORAL | Status: DC
  Administered 2015-10-15: 20 mg via ORAL
  Filled 2015-10-14: qty 1

## 2015-10-14 MED ORDER — ONDANSETRON HCL 4 MG/2ML IJ SOLN
4.0000 mg | Freq: Four times a day (QID) | INTRAMUSCULAR | Status: DC | PRN
  Administered 2015-10-21 – 2015-10-23 (×2): 4 mg via INTRAVENOUS
  Filled 2015-10-14 (×3): qty 2

## 2015-10-14 MED ORDER — DILTIAZEM HCL 60 MG PO TABS
120.0000 mg | ORAL_TABLET | Freq: Every day | ORAL | Status: DC
  Filled 2015-10-14: qty 2

## 2015-10-14 MED ORDER — NITROGLYCERIN 0.4 MG SL SUBL: 0.4000 mg | SUBLINGUAL_TABLET | SUBLINGUAL | Status: DC | PRN

## 2015-10-14 NOTE — ED Notes (Signed)
Yellow Draiage noted to the buttocks.

## 2015-10-14 NOTE — Progress Notes (Signed)
Pt came from ED to 6N12. Pt A/o x 4. Oriented to room.

## 2015-10-14 NOTE — Patient Outreach (Signed)
[facility]) Care Management  [facility] Social Work  10/14/2015  Lindsay Cobb August 10, 1941 [id number]  Subjective:    Objective:   Current Medications:  No current outpatient prescriptions on file.   No current facility-administered medications for this visit.    Functional Status:  In your present state of health, do you have any difficulty performing the following activities: 10/07/2015 10/03/2015  Hearing? N N  Vision? N N  Difficulty concentrating or making decisions? Lindsay Cobb  Walking or climbing stairs? Y Y  Dressing or bathing? Y Y  Doing errands, shopping? Lindsay Cobb  Preparing Food and eating ? Y -  Using the Toilet? N -  In the past six months, have you accidently leaked urine? N -  Do you have problems with loss of bowel control? N -  Managing your Medications? Y -  Managing your Finances? Y -  Housekeeping or managing your Housekeeping? Y -    Fall/Depression Screening:  PHQ 2/9 Scores 10/07/2015 10/03/2015 05/21/2014  PHQ - 2 Score 2 2 0  PHQ- 9 Score 6 6 -    Assessment:  CSW met client on 10/14/15 at client room at [facility] in [place], [place]. Patient assessed in  [place] for continued care needs. CSW will continue to collaborate with the skilled nursing facility social worker to facilitate discharge planning needs and communicate with the patient and family.  Client is adjusting to [facility] facility and informed CSW that she had prevoulsy received care at [facility].  Client recently had surgery on her left leg and is currently receiving antibiotic following surgery to left leg. Client said she does have some pain in her left leg at surgery site.  She is receiving skilled nursing care as needed at [facility].  Client said she is trying to participate in scheduled physical therapy exercises at present.  She said she is only receiving physical therapy services at present. She has support from her spouse, Lindsay Cobb. She has periodic visits from her sister.  Her primary community doctor is Dr. Vic Cobb.  She said she already has a walker at her home.  She said she has appointment for follow up visit with her surgeon in a few weeks.  Client said she understands that she may have to remain at [facility] for a number of weeks to receive needed antibiotics for client. Client said she thinks she will receive prescribed antibiotic for a total of 8 weeks.   Client said she is sleeping well. She said she has reduced appetite.  CSW and client have completed  PHQ2 and PHQ 9 for client. CSW spoke via phone with Dr. Deanne Cobb on 10/14/15 regarding client .Dr. Cyndia Cobb said she thought client would likely remain at [facility] for a number of weeks until she had received prescribed antibiotics medication dosage as scheduled by medical doctor.  Client is taking medications as prescribed.  She said she likes to watch TV to help her relax. She likes visiting with her spouse and her daughter to help her relax. CSW talked with client about relaxation techniques to help her manage anxiety/depression. CSW talked with client about deep breathing exercises and muscle relaxing exercises to help client manage anxiety/depression.  CSW gave client the [facility] CSW card with CSW number of [phone] on card. CSW encouraged client to call CSW at [phone] as needed to address social work needs of client. CSW thanked client for visit with CSW  at [facility] on 10/14/15.  Plan:  Client to participate actively in scheduled physical therapy sessions for client at facility. Cient to cooperate with care providers at [facility]. Client to communicate as needed with [facility] social worker to finalize discharge plan for client CSW to call client in 4 weeks to assess client needs at that time.  [name].Lindsay Cobb MSW, LCSW Licensed Clinical Social Worker [facility] Care Management [phone]

## 2015-10-14 NOTE — ED Notes (Signed)
Patient has a PICC line placed on arrival.

## 2015-10-14 NOTE — Progress Notes (Addendum)
Patient ID: Lindsay Cobb, female   DOB: 09/15/1941, 74 y.o.   MRN: [id number]                PROGRESS NOTE  DATE:  10/13/2015       FACILITY: [facility]         LEVEL OF CARE:   SNF   Acute Visit                      CHIEF COMPLAINT:  Elevated white count, profound protein calorie malnutrition.     HISTORY OF PRESENT ILLNESS:  I saw Lindsay Cobb yesterday to readmit her to the facility.    Lab work from today shows an elevated white count  of 14.2, but with a normal differential count.  Her hemoglobin is 7.9.  I was asked to see her because of this.  She does not have a fever history.    In discussing things with the wound care nurse, she had several ulcers on her buttocks.  I looked at most of these, or so I thought, yesterday and they all looked superficial.  However, one of them is apparently necrotic and I was asked to look at her today.    Reviewing her history, she is a lady who developed a septic left knee surgery.  She developed a septic joint and osteomyelitis, persistent bacteremia with MRSA.  She comes back to us on daptomycin 1 g daily.    BMP Latest Ref Rng 10/13/2015 10/10/2015 10/08/2015  Glucose 65 - 99 mg/dL 157(H) 108(H) 115(H)  BUN 6 - 20 mg/dL 16 12 13  Creatinine 0.44 - 1.00 mg/dL 1.38(H) 1.02(H) 1.04(H)  Sodium 135 - 145 mmol/L 129(L) 136 138  Potassium 3.5 - 5.1 mmol/L 5.0 4.0 3.3(L)  Chloride 101 - 111 mmol/L 95(L) 99(L) 98(L)  CO2 22 - 32 mmol/L 25 29 28  Calcium 8.9 - 10.3 mg/dL 8.2(L) 8.0(L) 8.0(L)   CBC Latest Ref Rng 10/13/2015 10/10/2015 10/05/2015  WBC 4.0 - 10.5 K/uL 14.2(H) 11.8(H) 14.6(H)  Hemoglobin 12.0 - 15.0 g/dL 7.9(L) 7.4(L) 8.5(L)  Hematocrit 36.0 - 46.0 % 24.9(L) 23.0(L) 27.3(L)  Platelets 150 - 400 K/uL 451(H) 293 304      REVIEW OF SYSTEMS:    GENERAL:  No fever.  No chills.    HEENT:    CHEST/RESPIRATORY:  No shortness of breath.  No sputum.    CARDIAC:  No chest pain.   GI:  No nausea, vomiting, or diarrhea.           GU:  No dysuria.    SKIN:  Again, apparently one of these ulcers that I labeled as "superficial" yesterday is actually a deeper wound.    PHYSICAL EXAMINATION:   GENERAL APPEARANCE:  The patient does not look to be in any distress.   CHEST/RESPIRATORY:  Clear air entry bilaterally.    CARDIOVASCULAR:   CARDIAC:  Heart sounds are normal.  There are no murmurs.    GASTROINTESTINAL:   ABDOMEN:  No masses.    LIVER/SPLEEN/KIDNEYS:  No liver, no spleen.  No tenderness.     GENITOURINARY:   BLADDER:  Not distended.   There is no CVA tenderness.    SKIN:   INSPECTION:  Over the left buttock roughly at the area of the ischial tuberosity is a large, perhaps 2 in x 2 in, circular area which is a pressure area covered with an adherent eschar.  There is purulent drainage here, which we   have already cultured.  Some tenderness.  No crepitus is noted.   MUSCULOSKELETAL:   EXTREMITIES:   LEFT LOWER EXTREMITY:  Her amputation site on the left leg, mid femoral level, looks fine.  There is no evidence of an infection here.    ASSESSMENT/PLAN:       Leukocytosis.  Likely secondary to an infected decubitus ulcer Although she has persistent MRSA sepsis as well. Recent blood culture was clear  Infected unstageable decubitus ulcer over the left buttock, roughly at the level of the ischial tuberosity.  This is going to need a debridement on Wednesday.  I will add empiric gram-negative coverage in the form of ciprofloxacin, pending culture of the grossly purulent material draining out of this.  The exact history of this wound is unclear at this point. The discharge is grossly purulent and I expect with represent an accurate culture  Profound protein calorie malnutrition.   Her albumin level is 2.1, although this seems to be gradually increasing versus 1.7 five days ago and 1.9 three days ago.     Hyponatremia.   The cause of this is not really clear, although it seems to have come down over the last several  days, as well.  I think this is probably the Lasix.  I am going to drop this down to 20 mg a day.       Lab work is pending from ID. The decubitus ulcer will likely effect the ESR AND CRP they have previously ordered although I have not seen these results

## 2015-10-14 NOTE — H&P (Addendum)
[facility] History and Physical  Lindsay Cobb LDJ:[id number] DOB: [birth year] DOA: 10/14/2015  Referring physician: ED PCP: Vic Blackbird, MD   Chief Complaint: Couldn't use the bathroom  HPI:  Patient is a 74 year old female with a past history significant for diabetes mellitus type 2, hyperlipidemia, hypertension, MRSA bacteremia, s/p left above-knee amputation; who presents from [facility] facility after lab work showed elevation in her ESR and CRP and question of patient's left decubitus ulcer looking more necrotic. On admission to the hospital patient was white blood cell count to 14.2, hemoglobin of 7.9. Patient had been receiving daptomycin 1 g daily while at the facility with his history of MRSA bacteremia. Patient had been followed by ID Dr. Drucilla Schmidt. Patient provides a somewhat different history stating that the reason why she came in is because she couldn't use the bathroom. Reports having associated symptoms of nausea, vomiting, and decreased appetite. Reports that the last time she had a bowel movement was approximately 1 week ago. Nurse performs a bedside bladder scan which shows only proximally 17 mL of fluid present.   Review of Systems  Constitutional: Negative for fever and chills.  HENT: Negative for ear pain and hearing loss.   Respiratory: Negative for cough and hemoptysis.   Cardiovascular: Negative for chest pain and orthopnea.  Gastrointestinal: Positive for nausea, vomiting, abdominal pain and constipation.  Genitourinary: Positive for frequency. Negative for dysuria and urgency.  Musculoskeletal: Positive for back pain and joint pain.  Skin: Negative for itching and rash.  Neurological: Negative for tingling and tremors.  Endo/Heme/Allergies: Negative for environmental allergies and polydipsia.  Psychiatric/Behavioral: Negative for suicidal ideas. The patient is nervous/anxious.      Past Medical History  Diagnosis Date  . Diabetes  mellitus     takes Januvia,Metformin,and Glipizide daily  . Hyperlipidemia     takes Lovastatin nightly  . Anemia     takes Ferrous Gluconate daily  . Peripheral edema     takes Furosemide daily as needed  . Depression     takes Tofranil daily  . Hypertension     takes Amlodipine,Atenolol,and Benazepril daily  . Arthritis   . Joint pain   . Joint swelling   . Cataracts, bilateral     immature  . Infection of skin of knee     left     Past Surgical History  Procedure Laterality Date  . Breast biopsy Left   . Colonoscopy    . Esophagogastroduodenoscopy    . Ercp    . Patellectomy Left 07/11/2015    Procedure: Left Partial PATELLECTOMY;  Surgeon: Leandrew Koyanagi, MD;  Location: [place];  Service: Orthopedics;  Laterality: Left;  . Irrigation and debridement knee Left 08/15/2015    Procedure: IRRIGATION AND DEBRIDEMENT LEFT KNEE;  Surgeon: Leandrew Koyanagi, MD;  Location: [place];  Service: Orthopedics;  Laterality: Left;  . Patellectomy Left 08/15/2015    Procedure: Revision PATELLECTOMY;  Surgeon: Leandrew Koyanagi, MD;  Location: [place];  Service: Orthopedics;  Laterality: Left;  . I&d extremity Left 08/18/2015    Procedure: IRRIGATION AND DEBRIDEMENT EXTREMITY;  Surgeon: Leandrew Koyanagi, MD;  Location: [place];  Service: Orthopedics;  Laterality: Left;  Marland Kitchen Minor application of wound vac  08/18/2015    Procedure: MINOR APPLICATION OF WOUND VAC;  Surgeon: Leandrew Koyanagi, MD;  Location: [place];  Service: Orthopedics;;  . Irrigation and debridement knee Left 08/20/2015    Procedure: IRRIGATION AND DEBRIDEMENT KNEE WITH PLACEMENT OF INTEGRA;  Surgeon: Leandrew Koyanagi, MD;  Location: [place];  Service: Orthopedics;  Laterality: Left;  . I&d extremity Left 09/10/2015    Procedure: LEFT KNEE SPLIT THICKNESS SKIN GRAFT, IRRIGATION AND DEBRIDEMENT, WOUND VAC;  Surgeon: Leandrew Koyanagi, MD;  Location: [place];  Service: Orthopedics;  Laterality: Left;  . Skin split graft Left 09/10/2015    Procedure: SKIN GRAFT SPLIT THICKNESS;   Surgeon: Leandrew Koyanagi, MD;  Location: [place];  Service: Orthopedics;  Laterality: Left;  . Application of wound vac Left 09/10/2015    Procedure: APPLICATION OF WOUND VAC;  Surgeon: Leandrew Koyanagi, MD;  Location: [place];  Service: Orthopedics;  Laterality: Left;  . I&d extremity Left 09/12/2015    Procedure: IRRIGATION AND DEBRIDEMENT LEFT KNEE WITH PLACEMENT OF WOUND VAC;  Surgeon: Leandrew Koyanagi, MD;  Location: [place];  Service: Orthopedics;  Laterality: Left;  . Amputation Left 09/28/2015    Procedure: AMPUTATION ABOVE KNEE;  Surgeon: Newt Minion, MD;  Location: [place];  Service: Orthopedics;  Laterality: Left;  . Tee without cardioversion N/A 10/02/2015    Procedure: TRANSESOPHAGEAL ECHOCARDIOGRAM (TEE);  Surgeon: Skeet Latch, MD;  Location: [facility] ENDOSCOPY;  Service: Cardiovascular;  Laterality: N/A;      Social History:  reports that she has never smoked. She has never used smokeless tobacco. She reports that she does not drink alcohol or use illicit drugs.  where does patient live--SNF Can patient participate in ADLs? Yes, but needs assistance with transfers  No Known Allergies  Family History  Problem Relation Age of Onset  . Hypertension Mother   . Diabetes Mother   . Hypertension Father        Prior to Admission medications   Medication Sig Start Date End Date Taking? Authorizing Provider  acetaminophen (TYLENOL) 500 MG tablet Take 500 mg by mouth every 6 (six) hours as needed.   Yes Historical Provider, MD  albuterol (PROVENTIL HFA;VENTOLIN HFA) 108 (90 BASE) MCG/ACT inhaler Inhale 2 puffs into the lungs every 4 (four) hours as needed for wheezing or shortness of breath.   Yes Historical Provider, MD  arformoterol (BROVANA) 15 MCG/2ML NEBU Take 15 mcg by nebulization 2 (two) times daily.   Yes Historical Provider, MD  atorvastatin (LIPITOR) 80 MG tablet Take 80 mg by mouth daily.   Yes Historical Provider, MD  benztropine (COGENTIN) 0.5 MG tablet Take 0.5 mg by mouth daily.    Yes Historical Provider, MD  clonazePAM (KLONOPIN) 1 MG tablet Take 1 mg by mouth 2 (two) times daily.   Yes Historical Provider, MD  clopidogrel (PLAVIX) 75 MG tablet Take 75 mg by mouth daily.   Yes Historical Provider, MD  diltiazem (CARDIZEM) 120 MG tablet Take 120 mg by mouth daily.   Yes Historical Provider, MD  divalproex (DEPAKOTE) 250 MG DR tablet Take 250 mg by mouth 3 (three) times daily.   Yes Historical Provider, MD  ipratropium-albuterol (DUONEB) 0.5-2.5 (3) MG/3ML SOLN Take 3 mLs by nebulization every 6 (six) hours as needed. For shortness of breath   Yes Historical Provider, MD  mirtazapine (REMERON) 15 MG tablet Take 15 mg by mouth at bedtime.   Yes Historical Provider, MD  nitroGLYCERIN (NITROSTAT) 0.4 MG SL tablet Place 0.4 mg under the tongue every 5 (five) minutes as needed for chest pain.   Yes Historical Provider, MD  pantoprazole (PROTONIX) 40 MG tablet Take 40 mg by mouth daily.   Yes Historical Provider, MD  polyethylene glycol (MIRALAX / GLYCOLAX) packet  Take 17 g by mouth daily. For constipation   Yes Historical Provider, MD  predniSONE (DELTASONE) 20 MG tablet Take 20 mg by mouth 2 (two) times daily with a meal.   Yes Historical Provider, MD  QUEtiapine (SEROQUEL) 400 MG tablet Take 400 mg by mouth at bedtime.   Yes Historical Provider, MD  sertraline (ZOLOFT) 50 MG tablet Take 50 mg by mouth 2 (two) times daily.   Yes Historical Provider, MD  amLODipine (NORVASC) 10 MG tablet TAKE 1 TABLET EVERY DAY Patient not taking: Reported on 10/14/2015 03/26/15   Alycia Rossetti, MD  aspirin EC 325 MG tablet Take 1 tablet (325 mg total) by mouth daily. Patient not taking: Reported on 10/14/2015 07/11/15   Leandrew Koyanagi, MD  atenolol (TENORMIN) 50 MG tablet Take 1 tablet (50 mg total) by mouth daily. Patient not taking: Reported on 10/14/2015 03/14/14   Alycia Rossetti, MD  benazepril (LOTENSIN) 20 MG tablet TAKE 1 TABLET EVERY DAY Patient not taking: Reported on 10/14/2015 03/26/15    Alycia Rossetti, MD  DAPTOmycin 900 mg in sodium chloride 0.9 % 100 mL Inject 900 mg into the vein daily. Patient not taking: Reported on 10/14/2015 10/07/15 11/25/15  Nita Sells, MD  furosemide (LASIX) 40 MG tablet Take 1 tablet (40 mg total) by mouth daily. Patient not taking: Reported on 10/14/2015 10/06/15   Nita Sells, MD  glucose blood (ACCU-CHEK AVIVA) test strip Use to monitor FSBS 1x daily. Dx: E11.9 12/31/14   Alycia Rossetti, MD  imipramine (TOFRANIL) 50 MG tablet Take 1 tablet (50 mg total) by mouth 3 (three) times daily. Patient not taking: Reported on 10/14/2015 04/21/15   Alycia Rossetti, MD  JANUVIA 25 MG tablet TAKE ONE TABLET BY MOUTH ONCE DAILY Patient not taking: Reported on 10/14/2015 06/04/15   Alycia Rossetti, MD  lactulose (CHRONULAC) 10 GM/15ML solution Take 30 mLs (20 g total) by mouth 2 (two) times daily. Patient not taking: Reported on 10/14/2015 10/06/15   Nita Sells, MD  lovastatin (MEVACOR) 10 MG tablet Take 1 tablet (10 mg total) by mouth at bedtime. Patient not taking: Reported on 10/14/2015 05/21/14   Alycia Rossetti, MD  metFORMIN (GLUCOPHAGE) 1000 MG tablet TAKE 1 TABLET TWICE DAILY WITH A MEAL Patient not taking: Reported on 10/14/2015 04/21/15   Alycia Rossetti, MD  oxyCODONE (OXY IR/ROXICODONE) 5 MG immediate release tablet Take 1-3 tablets (5-15 mg total) by mouth every 4 (four) hours as needed. Patient not taking: Reported on 10/14/2015 09/17/15   Gildardo Cranker, DO  potassium chloride SA (K-DUR,KLOR-CON) 20 MEQ tablet Take 2 tablets (40 mEq total) by mouth 2 (two) times daily. Patient not taking: Reported on 10/14/2015 10/07/15   Nita Sells, MD     Physical Exam: Filed Vitals:   10/14/15 2204 10/14/15 2205 10/14/15 2215 10/14/15 2226  BP: 98/58 99/58 122/50   Pulse: 93 93 93   Temp: 98.5 F (36.9 C)   98.7 F (37.1 C)  TempSrc: Oral   Rectal  Resp: $Remo'26 24 16   'GlQmK$ SpO2: 100% 100% 100%      Constitutional: Vital signs  reviewed. Patient is a well-developed and well-nourished in no acute distress and cooperative with exam. Alert and oriented x3.  Head: Normocephalic and atraumatic  Ear: TM normal bilaterally  Mouth: no erythema or exudates, MMM  Eyes: PERRL, EOMI, conjunctivae normal, No scleral icterus.  Neck: Supple, Trachea midline normal ROM, No JVD, mass, thyromegaly, or carotid bruit present.  Cardiovascular: RRR, S1 normal, S2 normal, no MRG, pulses symmetric and intact bilaterally  Pulmonary/Chest: CTAB, no wheezes, rales, or rhonchi  Abdominal: Soft. Non-tender, mildly distended, decreased bowel sounds are normal, no masses, organomegaly, or guarding present.  GU: no CVA tenderness Musculoskeletal: No joint deformities, erythema, or stiffness, ROM full and no nontender Ext: Left above-knee amputation with mild swelling no significant erythema noted Hematology: no cervical, inginal, or axillary adenopathy.  Neurological: A&O x3, Strenght is normal and symmetric bilaterally patient's upper extremities normal strength in the right lower extremity, cranial nerve II-XII are grossly intact, no focal motor deficit, sensory intact to light touch bilaterally.  Skin: Stage II-III ulcer of the left gluteal region that appears to be approximately 3 x 3 x.25 inch. On the right gluteal region there are smaller superficial pressure ulcer  Psychiatric: Patient is very anxious  Data Review   Micro Results No results found for this or any previous visit (from the past 240 hour(s)).  Radiology Reports US Renal  09/29/2015  CLINICAL DATA:  Acute renal injury. EXAM: RENAL / URINARY TRACT ULTRASOUND COMPLETE COMPARISON:  None. FINDINGS: Extremely limited exam due to body habitus. Right Kidney: Length: 9.8 cm.  No definite hydronephrosis. Left Kidney: Length: 9.5 cm.  No definite hydronephrosis. Bladder: Not visualized. IMPRESSION: Extremely limited examination due to body habitus. No definite hydronephrosis.  Electronically Signed   By: Lovey Newcomer M.D.   On: 09/29/2015 18:49   Mr Knee Left  Wo Contrast  09/27/2015  CLINICAL DATA:  Status post partial left patellectomy 07/11/2015 due to a fracture. The patient's surgical wound subsequently became infected and dehisced. Status post irrigation and debridement. Now with staphylococcal bacteremia. EXAM: MRI OF THE LEFT KNEE WITHOUT CONTRAST TECHNIQUE: Multiplanar, multisequence MR imaging of the knee was performed. No intravenous contrast was administered. COMPARISON:  Plain films left knee 07/04/2015. FINDINGS: Surgical wound is seen anteriorly off the inferior pole the patella. There is diffuse and intense subcutaneous edema about the knee. All imaged musculature demonstrates marked atrophy. MENISCI Medial meniscus:  Intact. Lateral meniscus: There is a horizontal tear in the posterior horn of the lateral meniscus reaching the meniscal undersurface. The body of the lateral meniscus is extruded peripherally. LIGAMENTS Cruciates:  Intact. Collaterals:  Intact. CARTILAGE Patellofemoral: Extensive hyaline cartilage loss is present diffusely. Medial:  Thinned throughout. Lateral:  Thinned throughout. Joint: A small amount of joint fluid is present. Synovium about the joint appears markedly thickened. Intense edema is seen in Hoffa's fat throughout. Popliteal Fossa:  No Baker's cyst. Extensor Mechanism: The patient is status post inferior patellectomy and reconstruction of the patellar tendon. The extensor mechanism appears intact. Bones: Marrow edema is seen throughout the patella and in the medial and lateral femoral condyles and tibial plateaus. The appearance is highly suspicious for osteomyelitis. IMPRESSION: Intense subcutaneous edema and synovitis about the joint most consistent with infection. Marrow edema throughout the patella and in the tibia and femur is most consistent with osteomyelitis. No abscess is identified. Horizontal tear posterior horn lateral meniscus.  Status post inferior patellectomy and reconstruction of patellar tendon. Marked atrophy of all musculature about the knee. Electronically Signed   By: Inge Rise M.D.   On: 09/27/2015 08:22   Dg Chest Port 1 View  10/14/2015  CLINICAL DATA:  Sepsis. EXAM: PORTABLE CHEST 1 VIEW COMPARISON:  09/26/2015 FINDINGS: The heart is enlarged but stable. A left PICC line is in place. The right PICC line has been removed. Low lung volumes with streaky bibasilar atelectasis  and basilar scarring changes. Stable eventration of the right hemidiaphragm. The bony structures are intact. IMPRESSION: Low lung volumes with vascular crowding and bibasilar atelectasis. Left PICC line in good position. Electronically Signed   By: Marijo Sanes M.D.   On: 10/14/2015 18:24   Dg Chest Port 1 View  09/26/2015  CLINICAL DATA:  Shortness of breath. EXAM: PORTABLE CHEST 1 VIEW COMPARISON:  09/21/2015 FINDINGS: The cardiac silhouette, mediastinal and hilar contours are within normal limits and stable. Low lung volumes with vascular crowding and bibasilar atelectasis. Could not exclude a right lower lobe infiltrate. No definite pleural effusions. The right PICC line is stable. IMPRESSION: Low lung volumes with vascular crowding and bibasilar atelectasis. Could not exclude a right basilar infiltrate. Electronically Signed   By: Marijo Sanes M.D.   On: 09/26/2015 14:54   Dg Chest Portable 1 View  09/21/2015  CLINICAL DATA:  Fever. EXAM: PORTABLE CHEST 1 VIEW COMPARISON:  None. FINDINGS: Airspace opacity at the right base concerning for pneumonia given the history. Right upper extremity PICC, tip at the SVC level. Normal heart size and aortic contours for technique and mild left rotation. Remote posterior right sixth rib fracture. IMPRESSION: Right base opacity concerning for pneumonia. Electronically Signed   By: Monte Fantasia M.D.   On: 09/21/2015 20:47     CBC  Recent Labs Lab 10/10/15 0500 10/13/15 1500 10/14/15 1735   WBC 11.8* 14.2* 12.6*  HGB 7.4* 7.9* 8.2*  HCT 23.0* 24.9* 26.0*  PLT 293 451* 454*  MCV 78.2 78.8 76.9*  MCH 25.2* 25.0* 24.3*  MCHC 32.2 31.7 31.5  RDW 19.8* 19.9* 19.7*  LYMPHSABS 2.3 1.6 1.5  MONOABS 1.0 2.0* 2.0*  EOSABS 2.6* 1.0* 0.8*  BASOSABS 0.1 0.1 0.1    Chemistries   Recent Labs Lab 10/08/15 0415 10/10/15 0500 10/13/15 1500 10/14/15 1735  NA 138 136 129* 127*  K 3.3* 4.0 5.0 4.2  CL 98* 99* 95* 92*  CO2 $Re'28 29 25 25  'QIz$ GLUCOSE 115* 108* 157* 131*  BUN $Re'13 12 16 14  'ujk$ CREATININE 1.04* 1.02* 1.38* 1.52*  CALCIUM 8.0* 8.0* 8.2* 8.3*  AST  --  $R'24 20 19  'LJ$ ALT  --  14 14 13*  ALKPHOS  --  87 89 93  BILITOT  --  0.7 0.5 0.6   ------------------------------------------------------------------------------------------------------------------ estimated creatinine clearance is 40.7 mL/min (by C-G formula based on Cr of 1.52). ------------------------------------------------------------------------------------------------------------------ No results for input(s): HGBA1C in the last 72 hours. ------------------------------------------------------------------------------------------------------------------ No results for input(s): CHOL, HDL, LDLCALC, TRIG, CHOLHDL, LDLDIRECT in the last 72 hours. ------------------------------------------------------------------------------------------------------------------ No results for input(s): TSH, T4TOTAL, T3FREE, THYROIDAB in the last 72 hours.  Invalid input(s): FREET3 ------------------------------------------------------------------------------------------------------------------ No results for input(s): VITAMINB12, FOLATE, FERRITIN, TIBC, IRON, RETICCTPCT in the last 72 hours.  Coagulation profile No results for input(s): INR, PROTIME in the last 168 hours.  No results for input(s): DDIMER in the last 72 hours.  Cardiac Enzymes  Recent Labs Lab 10/13/15 1500  CKMB 1.6    ------------------------------------------------------------------------------------------------------------------ Invalid input(s): POCBNP   CBG:  Recent Labs Lab 10/08/15 0740 10/08/15 1129 10/08/15 1649  GLUCAP 106* 115* 99         Assessment/Plan Principal Problem: Decubitus ulcer of left buttock, unstageable ([place]): Patient with a decubitus ulcer of the left gluteal region that appears to be a stage II or III on inspection.  -Wound care to evaluate -Possible need for surgical consult for debridement  Leukocytosis acutely elevated at 14.2 then trended down to 12.6. Lactic  acid level 2.6. Patient with a previous history of osteomyelitis and MRSA bacteremia. Patient had been receiving daptomycin at [facility] rehabilitation facility - ID consult in a.m. for recommendations on antibiotic choices as patient previously resolved daptomycin - Follow up blood culture results - trend lactic acid level  Urinary tract infection: Patient was reports urinary frequency and inability to void. However, bedside scans no urinary retention . - Continue IV Rocephin per pharmacy - Follow-up urine culture  AKI: Baseline Cr wnl 18months ago. Acutely worsen Cr 1.38 on admission.  -IVF -Check FeNa and US-renal -Follow up renal function by BMP -Avoid ACEI and NSAIDs     Type II diabetes mellitus (HCC) -cbg checks qac and HS - Discontinue oral hypoglycemic agents while hospitalized    Essential hypertension, benign - discontinued ACE secondary to aki.  Anxiety:Stable  -continue home dose of valium    Microcytic anemia: No overt signs of bleeding -Follow-up CBC in am    Hyponatremia. sodium level 127  -repeat cmp in am - IVF of NS $Rem'@125'Rsik$  ml per hour.  Constipation patient reports no bowel movement 1 week -Checking  abdominal x-ray  Hyperlipidemia -Continue atorvastatin 80 mg  GERD - continue protonix    S/p left  above knee amputation: Stable No significant signs of erythema or  drainage around patient stump  Obesity: Stable    Code Status:   full Family Communication: bedside Disposition Plan: admit   Total time spent 55 minutes.Greater than 50% of this time was spent in counseling, explanation of diagnosis, planning of further management, and coordination of care  [facility] Pager [phone]  If 7PM-7AM, please contact night-coverage [email or web address] Password [email or web address] 10/14/2015, 10:45 PM

## 2015-10-14 NOTE — ED Notes (Signed)
Patient comes today from the [facility]. Patient states she did not want to come and has no complaints. Per [facility] states she needs a follow up. This nurse [facility] spoke with Reola CalkinsBeck RN states Patient has infection to knee and buttocks with green  drainage. Per [facility] she needs a septic work-up. [facility] denies any fever states vitals have been stabe. Patient has been alert and oriented x4 [facility].

## 2015-10-14 NOTE — ED Notes (Signed)
Attempted report 

## 2015-10-14 NOTE — ED Notes (Signed)
Patient family wanted to leave contact information.   [phone] (husband)  [phone] (sister)

## 2015-10-14 NOTE — ED Provider Notes (Signed)
CSN: [id number]     Arrival date & time 10/14/15  1541 History   First MD Initiated Contact with Patient 10/14/15 1639     Chief Complaint  Patient presents with  . Follow-up   HPI Patient presents to the emergency room for evaluation of persistent drainage in the buttock. 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 Patient has been at the [facility]. She was seen by the nursing home doctor today. He noted significant purulent drainage of her wound. Patient was sent into the emergency room today for further evaluation. She denies any trouble with fevers or chills. She continues to have pain in her buttock and her left lower extremity. Past Medical History  Diagnosis Date  . Diabetes mellitus     takes Januvia,Metformin,and Glipizide daily  . Hyperlipidemia     takes Lovastatin nightly  . Anemia     takes Ferrous Gluconate daily  . Peripheral edema     takes Furosemide daily as needed  . Depression     takes Tofranil daily  . Hypertension     takes Amlodipine,Atenolol,and Benazepril daily  . Arthritis   . Joint pain   . Joint swelling   . Cataracts, bilateral     immature  . Infection of skin of knee     left   Past Surgical History  Procedure Laterality Date  . Breast biopsy Left   . Colonoscopy    . Esophagogastroduodenoscopy    . Ercp    . Patellectomy Left 07/11/2015    Procedure: Left Partial PATELLECTOMY;  Surgeon: Leandrew Koyanagi, MD;  Location: [place];  Service: Orthopedics;  Laterality: Left;  . Irrigation and debridement knee Left 08/15/2015    Procedure: IRRIGATION AND DEBRIDEMENT LEFT KNEE;  Surgeon: Leandrew Koyanagi, MD;  Location: [place];  Service: Orthopedics;  Laterality: Left;  . Patellectomy Left 08/15/2015    Procedure: Revision PATELLECTOMY;  Surgeon: Leandrew Koyanagi, MD;   Location: [place];  Service: Orthopedics;  Laterality: Left;  . I&d extremity Left 08/18/2015    Procedure: IRRIGATION AND DEBRIDEMENT EXTREMITY;  Surgeon: Leandrew Koyanagi, MD;  Location: [place];  Service: Orthopedics;  Laterality: Left;  Marland Kitchen Minor application of wound vac  08/18/2015    Procedure: MINOR APPLICATION OF WOUND VAC;  Surgeon: Leandrew Koyanagi, MD;  Location: [place];  Service: Orthopedics;;  . Irrigation and debridement knee Left 08/20/2015    Procedure: IRRIGATION AND DEBRIDEMENT KNEE WITH PLACEMENT OF INTEGRA;  Surgeon: Leandrew Koyanagi, MD;  Location: [place];  Service: Orthopedics;  Laterality: Left;  . I&d extremity Left 09/10/2015    Procedure: LEFT KNEE SPLIT THICKNESS SKIN GRAFT, IRRIGATION AND DEBRIDEMENT, WOUND VAC;  Surgeon: Leandrew Koyanagi, MD;  Location: [place];  Service: Orthopedics;  Laterality: Left;  . Skin split graft Left 09/10/2015    Procedure: SKIN GRAFT SPLIT THICKNESS;  Surgeon: Leandrew Koyanagi, MD;  Location: [place];  Service: Orthopedics;  Laterality: Left;  . Application of wound vac Left 09/10/2015    Procedure: APPLICATION OF WOUND VAC;  Surgeon: Leandrew Koyanagi, MD;  Location: [place];  Service: Orthopedics;  Laterality: Left;  . I&d extremity Left 09/12/2015    Procedure: IRRIGATION AND DEBRIDEMENT LEFT KNEE WITH PLACEMENT OF WOUND VAC;  Surgeon: Leandrew Koyanagi, MD;  Location: [place];  Service: Orthopedics;  Laterality: Left;  . Amputation Left 09/28/2015    Procedure: AMPUTATION ABOVE KNEE;  Surgeon: Newt Minion, MD;  Location: [place];  Service: Orthopedics;  Laterality: Left;  . Tee without cardioversion N/A 10/02/2015    Procedure: TRANSESOPHAGEAL ECHOCARDIOGRAM (TEE);  Surgeon: Skeet Latch, MD;  Location: [facility] ENDOSCOPY;  Service: Cardiovascular;  Laterality: N/A;   Family History  Problem Relation Age of Onset  . Hypertension Mother   . Diabetes Mother   . Hypertension Father    Social History  Substance Use Topics  . Smoking status: Never Smoker   . Smokeless tobacco: Never  Used  . Alcohol Use: No   OB History    No data available     Review of Systems  Constitutional: Negative for fever.  Gastrointestinal: Negative for vomiting and diarrhea.  All other systems reviewed and are negative.     Allergies  Review of patient's allergies indicates no known allergies.  Home Medications   Prior to Admission medications   Medication Sig Start Date End Date Taking? Authorizing Provider  acetaminophen (TYLENOL) 500 MG tablet Take 500 mg by mouth every 6 (six) hours as needed.   Yes Historical Provider, MD  albuterol (PROVENTIL HFA;VENTOLIN HFA) 108 (90 BASE) MCG/ACT inhaler Inhale 2 puffs into the lungs every 4 (four) hours as needed for wheezing or shortness of breath.   Yes Historical Provider, MD  arformoterol (BROVANA) 15 MCG/2ML NEBU Take 15 mcg by nebulization 2 (two) times daily.   Yes Historical Provider, MD  atorvastatin (LIPITOR) 80 MG tablet Take 80 mg by mouth daily.   Yes Historical Provider, MD  benztropine (COGENTIN) 0.5 MG tablet Take 0.5 mg by mouth daily.   Yes Historical Provider, MD  clonazePAM (KLONOPIN) 1 MG tablet Take 1 mg by mouth 2 (two) times daily.   Yes Historical Provider, MD  clopidogrel (PLAVIX) 75 MG tablet Take 75 mg by mouth daily.   Yes Historical Provider, MD  diltiazem (CARDIZEM) 120 MG tablet Take 120 mg by mouth daily.   Yes Historical Provider, MD  divalproex (DEPAKOTE) 250 MG DR tablet Take 250 mg by mouth 3 (three) times daily.   Yes Historical Provider, MD  ipratropium-albuterol (DUONEB) 0.5-2.5 (3) MG/3ML SOLN Take 3 mLs by nebulization every 6 (six) hours as needed. For shortness of breath   Yes Historical Provider, MD  mirtazapine (REMERON) 15 MG tablet Take 15 mg by mouth at bedtime.   Yes Historical Provider, MD  nitroGLYCERIN (NITROSTAT) 0.4 MG SL tablet Place 0.4 mg under the tongue every 5 (five) minutes as needed for chest pain.   Yes Historical Provider, MD  pantoprazole (PROTONIX) 40 MG tablet Take 40 mg by  mouth daily.   Yes Historical Provider, MD  polyethylene glycol (MIRALAX / GLYCOLAX) packet Take 17 g by mouth daily. For constipation   Yes Historical Provider, MD  predniSONE (DELTASONE) 20 MG tablet Take 20 mg by mouth 2 (two) times daily with a meal.   Yes Historical Provider, MD  QUEtiapine (SEROQUEL) 400 MG tablet Take 400 mg by mouth at bedtime.   Yes Historical Provider, MD  sertraline (ZOLOFT) 50 MG tablet Take 50 mg by mouth 2 (two) times daily.   Yes Historical Provider, MD  amLODipine (NORVASC) 10 MG tablet TAKE 1 TABLET EVERY DAY Patient not taking: Reported on 10/14/2015 03/26/15   Alycia Rossetti, MD  aspirin EC 325 MG tablet Take 1 tablet (325 mg total) by mouth daily.  Patient not taking: Reported on 10/14/2015 07/11/15   Leandrew Koyanagi, MD  atenolol (TENORMIN) 50 MG tablet Take 1 tablet (50 mg total) by mouth daily. Patient not taking: Reported on 10/14/2015 03/14/14   Alycia Rossetti, MD  benazepril (LOTENSIN) 20 MG tablet TAKE 1 TABLET EVERY DAY Patient not taking: Reported on 10/14/2015 03/26/15   Alycia Rossetti, MD  DAPTOmycin 900 mg in sodium chloride 0.9 % 100 mL Inject 900 mg into the vein daily. Patient not taking: Reported on 10/14/2015 10/07/15 11/25/15  Nita Sells, MD  furosemide (LASIX) 40 MG tablet Take 1 tablet (40 mg total) by mouth daily. Patient not taking: Reported on 10/14/2015 10/06/15   Nita Sells, MD  glucose blood (ACCU-CHEK AVIVA) test strip Use to monitor FSBS 1x daily. Dx: E11.9 12/31/14   Alycia Rossetti, MD  imipramine (TOFRANIL) 50 MG tablet Take 1 tablet (50 mg total) by mouth 3 (three) times daily. Patient not taking: Reported on 10/14/2015 04/21/15   Alycia Rossetti, MD  JANUVIA 25 MG tablet TAKE ONE TABLET BY MOUTH ONCE DAILY Patient not taking: Reported on 10/14/2015 06/04/15   Alycia Rossetti, MD  lactulose (CHRONULAC) 10 GM/15ML solution Take 30 mLs (20 g total) by mouth 2 (two) times daily. Patient not taking: Reported on 10/14/2015  10/06/15   Nita Sells, MD  lovastatin (MEVACOR) 10 MG tablet Take 1 tablet (10 mg total) by mouth at bedtime. Patient not taking: Reported on 10/14/2015 05/21/14   Alycia Rossetti, MD  metFORMIN (GLUCOPHAGE) 1000 MG tablet TAKE 1 TABLET TWICE DAILY WITH A MEAL Patient not taking: Reported on 10/14/2015 04/21/15   Alycia Rossetti, MD  oxyCODONE (OXY IR/ROXICODONE) 5 MG immediate release tablet Take 1-3 tablets (5-15 mg total) by mouth every 4 (four) hours as needed. Patient not taking: Reported on 10/14/2015 09/17/15   Gildardo Cranker, DO  potassium chloride SA (K-DUR,KLOR-CON) 20 MEQ tablet Take 2 tablets (40 mEq total) by mouth 2 (two) times daily. Patient not taking: Reported on 10/14/2015 10/07/15   Nita Sells, MD   BP 118/89 mmHg  Pulse 80  Temp(Src) 97.5 F (36.4 C) (Oral)  Resp 18  SpO2 100% Physical Exam  Constitutional: No distress.  HENT:  Head: Normocephalic and atraumatic.  Right Ear: External ear normal.  Left Ear: External ear normal.  Eyes: Conjunctivae are normal. Right eye exhibits no discharge. Left eye exhibits no discharge. No scleral icterus.  Neck: Neck supple. No tracheal deviation present.  Cardiovascular: Normal rate, regular rhythm and intact distal pulses.   Pulmonary/Chest: Effort normal and breath sounds normal. No stridor. No respiratory distress. She has no wheezes. She has no rales.  Abdominal: Soft. Bowel sounds are normal. She exhibits no distension. There is no tenderness. There is no rebound and no guarding.  Musculoskeletal: She exhibits no edema or tenderness.  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 She has normal strength. No cranial nerve deficit (no facial droop, extraocular movements intact, no slurred speech) or sensory deficit. She exhibits normal muscle  tone. She displays no seizure activity. Coordination normal.  Skin: Skin is warm and dry. No rash noted.  Psychiatric: She has a normal mood and affect.  Nursing note and vitals reviewed.   ED Course  Procedures (including critical care time) Labs Review Labs Reviewed  COMPREHENSIVE METABOLIC PANEL -  Abnormal; Notable for the following:    Sodium 127 (*)    Chloride 92 (*)    Glucose, Bld 131 (*)    Creatinine, Ser 1.52 (*)    Calcium 8.3 (*)    Albumin 2.0 (*)    ALT 13 (*)    GFR calc non Af Amer 33 (*)    GFR calc Af Amer 38 (*)    All other components within normal limits  CBC WITH DIFFERENTIAL/PLATELET - Abnormal; Notable for the following:    WBC 12.6 (*)    RBC 3.38 (*)    Hemoglobin 8.2 (*)    HCT 26.0 (*)    MCV 76.9 (*)    MCH 24.3 (*)    RDW 19.7 (*)    Platelets 454 (*)    Neutro Abs 8.3 (*)    Monocytes Absolute 2.0 (*)    Eosinophils Absolute 0.8 (*)    All other components within normal limits  URINALYSIS, ROUTINE W REFLEX MICROSCOPIC (NOT AT [facility]) - Abnormal; Notable for the following:    APPearance TURBID (*)    Hgb urine dipstick MODERATE (*)    Nitrite POSITIVE (*)    Leukocytes, UA LARGE (*)    All other components within normal limits  URINE MICROSCOPIC-ADD ON - Abnormal; Notable for the following:    Squamous Epithelial / LPF MANY (*)    Bacteria, UA FEW (*)    All other components within normal limits  I-STAT CG4 LACTIC ACID, ED - Abnormal; Notable for the following:    Lactic Acid, Venous 2.26 (*)    All other components within normal limits  CULTURE, BLOOD (ROUTINE X 2)  CULTURE, BLOOD (ROUTINE X 2)  URINE CULTURE  I-STAT CG4 LACTIC ACID, ED    Imaging Review Dg Chest Port 1 View  10/14/2015  CLINICAL DATA:  Sepsis. EXAM: PORTABLE CHEST 1 VIEW COMPARISON:  09/26/2015 FINDINGS: The heart is enlarged but stable. A left PICC line is in place. The right PICC line has been removed. Low lung volumes with streaky bibasilar atelectasis and  basilar scarring changes. Stable eventration of the right hemidiaphragm. The bony structures are intact. IMPRESSION: Low lung volumes with vascular crowding and bibasilar atelectasis. Left PICC line in good position. Electronically Signed   By: Marijo Sanes M.D.   On: 10/14/2015 18:24   I have personally reviewed and evaluated these images and lab results as part of my medical decision-making.   MDM   Final diagnoses:  Decubitus ulcer  UTI (lower urinary tract infection)    Pt has had a complicated recent medical history.  She denies any new complaints other than a decubitus ulcer on the buttock that has purulent drainage.  I attempted to call the nursing home staff but was unable to speak with anyone that could give me information about why she was sent.  i spoke with [facility] doctor on call but unfortunately she was not familiar with Ms Shrestha case and was not contacted by the nursing facility.  Pt's labs did come back showing increased ESR and CRP.  Her buttock wound may need debridement.  UA sugguest UTI.  I will consult with the medical service regarding admission.    Dorie Rank, MD 10/14/15 2100

## 2015-10-15 ENCOUNTER — Encounter (HOSPITAL_COMMUNITY): Payer: Self-pay | Admitting: General Practice

## 2015-10-15 DIAGNOSIS — L89312 Pressure ulcer of right buttock, stage 2: Secondary | ICD-10-CM

## 2015-10-15 DIAGNOSIS — Z794 Long term (current) use of insulin: Secondary | ICD-10-CM

## 2015-10-15 DIAGNOSIS — R700 Elevated erythrocyte sedimentation rate: Secondary | ICD-10-CM

## 2015-10-15 DIAGNOSIS — L89320 Pressure ulcer of left buttock, unstageable: Secondary | ICD-10-CM

## 2015-10-15 DIAGNOSIS — R7982 Elevated C-reactive protein (CRP): Secondary | ICD-10-CM

## 2015-10-15 DIAGNOSIS — Z89612 Acquired absence of left leg above knee: Secondary | ICD-10-CM

## 2015-10-15 DIAGNOSIS — E1121 Type 2 diabetes mellitus with diabetic nephropathy: Secondary | ICD-10-CM

## 2015-10-15 DIAGNOSIS — Z89619 Acquired absence of unspecified leg above knee: Secondary | ICD-10-CM

## 2015-10-15 DIAGNOSIS — Z8614 Personal history of Methicillin resistant Staphylococcus aureus infection: Secondary | ICD-10-CM

## 2015-10-15 DIAGNOSIS — D638 Anemia in other chronic diseases classified elsewhere: Secondary | ICD-10-CM

## 2015-10-15 LAB — COMPREHENSIVE METABOLIC PANEL
ALT: 12 U/L — AB (ref 14–54)
AST: 14 U/L — AB (ref 15–41)
Albumin: 1.6 g/dL — ABNORMAL LOW (ref 3.5–5.0)
Alkaline Phosphatase: 84 U/L (ref 38–126)
Anion gap: 10 (ref 5–15)
BUN: 17 mg/dL (ref 6–20)
CHLORIDE: 96 mmol/L — AB (ref 101–111)
CO2: 24 mmol/L (ref 22–32)
CREATININE: 1.8 mg/dL — AB (ref 0.44–1.00)
Calcium: 7.9 mg/dL — ABNORMAL LOW (ref 8.9–10.3)
GFR calc Af Amer: 31 mL/min — ABNORMAL LOW (ref >60)
GFR, EST NON AFRICAN AMERICAN: 27 mL/min — AB (ref >60)
GLUCOSE: 191 mg/dL — AB (ref 65–99)
Potassium: 4.4 mmol/L (ref 3.5–5.1)
Sodium: 130 mmol/L — ABNORMAL LOW (ref 135–145)
Total Bilirubin: 1 mg/dL (ref 0.3–1.2)
Total Protein: 5.8 g/dL — ABNORMAL LOW (ref 6.5–8.1)

## 2015-10-15 LAB — CBC
HCT: 22.6 % — ABNORMAL LOW (ref 36.0–46.0)
Hemoglobin: 7 g/dL — ABNORMAL LOW (ref 12.0–15.0)
MCH: 23.9 pg — ABNORMAL LOW (ref 26.0–34.0)
MCHC: 31 g/dL (ref 30.0–36.0)
MCV: 77.1 fL — AB (ref 78.0–100.0)
Platelets: 390 10*3/uL (ref 150–400)
RBC: 2.93 MIL/uL — ABNORMAL LOW (ref 3.87–5.11)
RDW: 19.8 % — AB (ref 11.5–15.5)
WBC: 13 10*3/uL — AB (ref 4.0–10.5)

## 2015-10-15 LAB — CREATININE, URINE, RANDOM: CREATININE, URINE: 31.83 mg/dL

## 2015-10-15 LAB — GLUCOSE, CAPILLARY
GLUCOSE-CAPILLARY: 141 mg/dL — AB (ref 65–99)
GLUCOSE-CAPILLARY: 128 mg/dL — AB (ref 65–99)
Glucose-Capillary: 188 mg/dL — ABNORMAL HIGH (ref 65–99)
Glucose-Capillary: 134 mg/dL — ABNORMAL HIGH (ref 65–99)

## 2015-10-15 LAB — SODIUM, URINE, RANDOM: Sodium, Ur: 99 mmol/L

## 2015-10-15 MED ORDER — HYDROCERIN EX CREA
TOPICAL_CREAM | Freq: Two times a day (BID) | CUTANEOUS | Status: DC
  Administered 2015-10-15: 1 via TOPICAL
  Administered 2015-10-15 – 2015-10-16 (×3): via TOPICAL
  Administered 2015-10-17: 1 via TOPICAL
  Administered 2015-10-17 – 2015-10-18 (×3): via TOPICAL
  Administered 2015-10-19: 1 via TOPICAL
  Administered 2015-10-19 – 2015-10-20 (×3): via TOPICAL
  Administered 2015-10-21 – 2015-10-22 (×3): 1 via TOPICAL
  Administered 2015-10-22: 11:00:00 via TOPICAL
  Administered 2015-10-23: 1 via TOPICAL
  Administered 2015-10-23 – 2015-10-24 (×2): via TOPICAL
  Filled 2015-10-15 (×2): qty 113

## 2015-10-15 MED ORDER — DEXTROSE 5 % IV SOLN
1.0000 g | INTRAVENOUS | Status: DC
  Administered 2015-10-15 – 2015-10-24 (×10): 1 g via INTRAVENOUS
  Filled 2015-10-15 (×11): qty 10

## 2015-10-15 MED ORDER — COLLAGENASE 250 UNIT/GM EX OINT
TOPICAL_OINTMENT | Freq: Every day | CUTANEOUS | Status: DC
  Administered 2015-10-16: 13:00:00 via TOPICAL
  Administered 2015-10-17: 1 via TOPICAL
  Administered 2015-10-18 – 2015-10-22 (×5): via TOPICAL
  Administered 2015-10-23: 1 via TOPICAL
  Administered 2015-10-24: 09:00:00 via TOPICAL
  Filled 2015-10-15 (×2): qty 30

## 2015-10-15 MED ORDER — SODIUM CHLORIDE 0.9 % IJ SOLN
10.0000 mL | INTRAMUSCULAR | Status: DC | PRN
  Administered 2015-10-15 – 2015-10-23 (×3): 10 mL
  Filled 2015-10-15 (×3): qty 40

## 2015-10-15 MED ORDER — CLONAZEPAM 0.5 MG PO TABS: 0.5000 mg | ORAL_TABLET | Freq: Two times a day (BID) | ORAL | Status: DC | PRN

## 2015-10-15 MED ORDER — HALOPERIDOL LACTATE 5 MG/ML IJ SOLN
1.0000 mg | Freq: Four times a day (QID) | INTRAMUSCULAR | Status: DC | PRN
  Administered 2015-10-15: 1 mg via INTRAVENOUS
  Filled 2015-10-15: qty 1

## 2015-10-15 MED ORDER — IMIPRAMINE HCL 50 MG PO TABS
50.0000 mg | ORAL_TABLET | Freq: Three times a day (TID) | ORAL | Status: DC
  Administered 2015-10-16 – 2015-10-24 (×26): 50 mg via ORAL
  Filled 2015-10-15 (×31): qty 1

## 2015-10-15 MED ORDER — LACTULOSE 10 GM/15ML PO SOLN
30.0000 g | Freq: Two times a day (BID) | ORAL | Status: DC
  Administered 2015-10-15 – 2015-10-16 (×2): 30 g via ORAL
  Filled 2015-10-15 (×3): qty 45

## 2015-10-15 MED ORDER — METRONIDAZOLE IN NACL 5-0.79 MG/ML-% IV SOLN
500.0000 mg | Freq: Three times a day (TID) | INTRAVENOUS | Status: DC
  Administered 2015-10-15 – 2015-10-21 (×18): 500 mg via INTRAVENOUS
  Filled 2015-10-15 (×22): qty 100

## 2015-10-15 MED ORDER — SODIUM CHLORIDE 0.9 % IV SOLN
1000.0000 mL | INTRAVENOUS | Status: DC
  Administered 2015-10-15 – 2015-10-18 (×2): 1000 mL via INTRAVENOUS

## 2015-10-15 MED ORDER — LORATADINE 10 MG PO TABS
10.0000 mg | ORAL_TABLET | Freq: Every day | ORAL | Status: DC
  Administered 2015-10-15 – 2015-10-24 (×10): 10 mg via ORAL
  Filled 2015-10-15 (×10): qty 1

## 2015-10-15 NOTE — Progress Notes (Signed)
Patient ID: Lindsay Cobb, female   DOB: 12/31/1940, 74 y.o.   MRN: [id number] Patient's left above-the-knee amputation has healed quite nicely there is no cellulitis no drainage no wound dehiscence. I will order for the staples and sutures to be removed. Mepilex dressing applied follow-up in the office in 2 weeks.

## 2015-10-15 NOTE — Procedures (Signed)
Incision and Debridement Procedure Note  Pre-operative Diagnosis: left buttock unstageable ulcer   Post-operative Diagnosis: same  Indications: This is a 74 yo black female who has recently had a multitude of medical problems along with decreased sensation of her lower extremities from DM which results in a left septic knee and subsequent AKA.  She has been essentially bed bound and has developed this unstageable wound along with 2 other stage 2 type small wounds on her buttock and sacrum.  She has some purulent drainage from the larger wound along with necrotic eschar.  We have been asked to evaluate the patient for debridement  Anesthesia: not needed  Procedure Details  The procedure, risks and complications have been discussed in detail (including, but not limited to infection, bleeding) with the patient, and the patient has given verbal consent to the procedure.  An #11 blade scalpel was used to perform and incision and debridement of a left buttock wound. Purulent drainage: present.  A large core of necrotic tissue was debrided away.  The initial wound was 4x8x0cm and 100% necrotic eschar.  After debridement the wound measured 4x8x3cm.  No blood loss occurred.  There was still some necrotic tissue that was present, but it was close to sensate tissue and no further debridement was able to be done. The wound was packed with a normal saline wet to dry 4x4 and covered.   Findings: Necrotic tissue and purulent drainage  EBL: 0 cc's  Condition: Tolerated procedure well and Stable   Complications: none.   2.  Tool used for debridement (curette, scapel, etc.)  #11 scalpel  3.  Frequency of surgical debridement.   once  4.  Measurement of total devitalized tissue (wound surface) before and after surgical debridement.   4x8x0cm before   4x8x3cm after  5.  Area and depth of devitalized tissue removed from wound.  4x5x3cm of necrotic tissue was removed from the wound, 60cm^3  6.  Blood  loss and description of tissue removed.  No blood loss, necrotic tissue  7.  Evidence of the progress of the wound's response to treatment.  A.  Current wound volume (current dimensions and depth).  4x8x3cm  B.  Presence (and extent of) of infection.  There was some purulent drainage, no further infection was noted after final inspection  C.  Presence (and extent of) of non viable tissue.  There is still a thin layer of necrotic tissue lining the wond  D.  Other material in the wound that is expected to inhibit healing.  See above  8.  Was there any viable tissue removed (measurements): no  Shamere Campas E 3:03 PM 10/15/2015

## 2015-10-15 NOTE — Consult Note (Addendum)
WOC wound consult note Reason for Consult: Consult requested for buttock wounds.   Right buttock with 2 areas of stage 2 pressure injuries; 1X1X.1cm to upper buttock, and 4X1X.1cm to lower buttock, pink and dry. Left buttock with unstageable pressure injury, fluctuant when probed.  7X3.5cm, cut small opening on wound edge which drained a large amt thick tan pus with strong foul odor.  Swab inserted to 5cm.    Pressure Ulcer POA: Yes Dressing procedure/placement/frequency: Discussed plan of care with primary team.  Pt could benefit from surgical debridement for the significant amt of nonviable tissue, then hydrotherapy could assist with the removal of the remaining nonviable tissue.  Foam dressing to right buttocks to protect and promote healing.  Air mattress to reduce pressure.   Will defer to [facility] team for further plan of care after a consult is performed. Please re-consult if further assistance is needed.  Thank-you,  Cammie Mcgeeawn Zahki Hoogendoorn MSN, RN, CWOCN, EnochWCN-AP, CNS [phone]

## 2015-10-15 NOTE — Progress Notes (Signed)
MEDICATION RELATED NOTE   Pharmacy Consult for Home Med Review  Home meds reviewed after discussion with [facility].  Please note the following meds that she was on there:  1.  Amlodipine 10 mg daily 2.  EC Aspirin 325 mg daily 3.  Atenolol 50 mg daily 4.  Benazepril 20 mg daily 5.  Cipro 500 mg bid x 10 days (LD scheduled 11/17 0900AM) 6.  Daptomycin/Cubicin - 900mg  daily at 14:00PM 7.  Ferrous Sulfate 325 mg daily with meals 8.  Imipramine 50 mg three times daily 9.  Sitagliptin 25mg  daily 10. Potassium chloride 40 meq bid 11. Lactulose 20gm bid 12. Furosemide 20mg  daily 13. Metformin 1000 mg bid with meals 14. Oxycodone 5-15mg  every 4 hours as needed (take 5mg  for mild pain, 10mg  for moderate pain and 15mg  for severe pain)  I have reviewed and updated her home med-list as above.  I have discussed current in-patient therapy with Dr. Lendell CapriceSullivan.  Will hold anti-hypertensives for now and start her imipramine in the morning.  Nadara MustardNita Calina Patrie, PharmD., MS Clinical Pharmacist Pager:  [phone] Thank you for allowing pharmacy to be part of this patients care team. 10/15/2015,11:26 AM

## 2015-10-15 NOTE — Consult Note (Signed)
Lindsay Cobb 1941-08-21  [id number].   Requesting MD: Dr. 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  According to her sister, who most information is obtained from, the patient developed this wound on her left buttock over the last several weeks to months when she has been in and out of the hospital and nursing homes.  Due to her AKA, she has been bed and chair bound and unable to mobilize to get pressure off of this area.  She was readmitted yesterday and we have been asked to see her for an unstageable left buttock wound.  ROS : difficult to obtain secondary to sedated and drowsy state  Family History  Problem Relation Age of Onset  . Hypertension Mother   . Diabetes Mother   . Hypertension Father     Past Medical History  Diagnosis Date  . Diabetes mellitus     takes Januvia,Metformin,and Glipizide daily  . Hyperlipidemia     takes Lovastatin nightly  . Anemia     takes Ferrous Gluconate daily  . Peripheral edema     takes Furosemide daily as needed  . Depression     takes Tofranil daily  . Hypertension     takes Amlodipine,Atenolol,and Benazepril daily  . Arthritis   . Joint pain   . Joint swelling   . Cataracts, bilateral     immature  . Infection of skin of knee     left  . Decubitus ulcer of buttock, unstageable ([place]) 10/2015    Past Surgical History  Procedure Laterality Date  . Breast biopsy Left   . Colonoscopy    . Esophagogastroduodenoscopy    . Ercp    . Patellectomy Left 07/11/2015    Procedure: Left Partial PATELLECTOMY;  Surgeon: Leandrew Koyanagi, MD;  Location: [place];  Service: Orthopedics;  Laterality: Left;  . Irrigation and debridement knee Left 08/15/2015    Procedure: IRRIGATION AND DEBRIDEMENT LEFT KNEE;  Surgeon: Leandrew Koyanagi, MD;  Location: [place];  Service: Orthopedics;  Laterality: Left;  .  Patellectomy Left 08/15/2015    Procedure: Revision PATELLECTOMY;  Surgeon: Leandrew Koyanagi, MD;  Location: [place];  Service: Orthopedics;  Laterality: Left;  . I&d extremity Left 08/18/2015    Procedure: IRRIGATION AND DEBRIDEMENT EXTREMITY;  Surgeon: Leandrew Koyanagi, MD;  Location: [place];  Service: Orthopedics;  Laterality: Left;  Marland Kitchen Minor application of wound vac  08/18/2015    Procedure: MINOR APPLICATION OF WOUND VAC;  Surgeon: Leandrew Koyanagi, MD;  Location: [place];  Service: Orthopedics;;  . Irrigation and debridement knee Left 08/20/2015    Procedure: IRRIGATION AND DEBRIDEMENT KNEE WITH PLACEMENT OF INTEGRA;  Surgeon: Leandrew Koyanagi, MD;  Location: [place];  Service: Orthopedics;  Laterality: Left;  . I&d extremity Left 09/10/2015    Procedure: LEFT KNEE SPLIT THICKNESS SKIN GRAFT, IRRIGATION AND DEBRIDEMENT, WOUND VAC;  Surgeon: Leandrew Koyanagi, MD;  Location: [place];  Service: Orthopedics;  Laterality: Left;  . Skin split graft Left 09/10/2015    Procedure: SKIN GRAFT SPLIT THICKNESS;  Surgeon: Leandrew Koyanagi, MD;  Location: [place];  Service: Orthopedics;  Laterality: Left;  . Application of wound vac Left 09/10/2015    Procedure: APPLICATION OF WOUND VAC;  Surgeon: Leandrew Koyanagi, MD;  Location: [place];  Service: Orthopedics;  Laterality: Left;  .  I&d extremity Left 09/12/2015    Procedure: IRRIGATION AND DEBRIDEMENT LEFT KNEE WITH PLACEMENT OF WOUND VAC;  Surgeon: Leandrew Koyanagi, MD;  Location: [place];  Service: Orthopedics;  Laterality: Left;  . Amputation Left 09/28/2015    Procedure: AMPUTATION ABOVE KNEE;  Surgeon: Newt Minion, MD;  Location: [place];  Service: Orthopedics;  Laterality: Left;  . Tee without cardioversion N/A 10/02/2015    Procedure: TRANSESOPHAGEAL ECHOCARDIOGRAM (TEE);  Surgeon: Skeet Latch, MD;  Location: [facility] END[facility];  Service: Cardiovascular;  Laterality: N/A;    Social History:  reports that she has never smoked. She has never used smokeless tobacco. She reports that she does not drink  alcohol or use illicit drugs.  Allergies: No Known Allergies  Medications Prior to Admission  Medication Sig Dispense Refill  . ciprofloxacin (CIPRO) 500 MG tablet Take 500 mg by mouth 2 (two) times daily. Give twice daily for 10 days - Last dose should be 10/23/2015 at 9AM    . ferrous sulfate 325 (65 FE) MG tablet Take 325 mg by mouth daily with breakfast.    . furosemide (LASIX) 20 MG tablet Take 20 mg by mouth daily.    Marland Kitchen oxyCODONE (OXY IR/ROXICODONE) 5 MG immediate release tablet Take 1-3 tablets (5-15 mg total) by mouth every 4 (four) hours as needed. (Patient taking differently: Take 5-15 mg by mouth every 4 (four) hours as needed ($RemoveBe'5mg'ekaQBVFrG$  for mild pain $RemoveB'10mg'OPHANsJE$  for moderate pain and $Remove'15mg'eIYcznG$  for severe pain). ) 180 tablet 0  . amLODipine (NORVASC) 10 MG tablet TAKE 1 TABLET EVERY DAY 90 tablet 1  . aspirin EC 325 MG tablet Take 1 tablet (325 mg total) by mouth daily. 84 tablet 0  . atenolol (TENORMIN) 50 MG tablet Take 1 tablet (50 mg total) by mouth daily. 90 tablet 1  . benazepril (LOTENSIN) 20 MG tablet TAKE 1 TABLET EVERY DAY 90 tablet 1  . DAPTOmycin 900 mg in sodium chloride 0.9 % 100 mL Inject 900 mg into the vein daily. 1 g 0  . glucose blood (ACCU-CHEK AVIVA) test strip Use to monitor FSBS 1x daily. Dx: E11.9 50 each 6  . imipramine (TOFRANIL) 50 MG tablet Take 1 tablet (50 mg total) by mouth 3 (three) times daily. 270 tablet 1  . JANUVIA 25 MG tablet TAKE ONE TABLET BY MOUTH ONCE DAILY 30 tablet 3  . lactulose (CHRONULAC) 10 GM/15ML solution Take 30 mLs (20 g total) by mouth 2 (two) times daily. 240 mL 0  . lovastatin (MEVACOR) 10 MG tablet Take 1 tablet (10 mg total) by mouth at bedtime. (Patient not taking: Reported on 10/14/2015) 90 tablet 2  . metFORMIN (GLUCOPHAGE) 1000 MG tablet TAKE 1 TABLET TWICE DAILY WITH A MEAL 180 tablet 1  . potassium chloride SA (K-DUR,KLOR-CON) 20 MEQ tablet Take 2 tablets (40 mEq total) by mouth 2 (two) times daily. 10 tablet 0    Blood pressure 96/47,  pulse 94, temperature 98.5 F (36.9 C), temperature source Oral, resp. rate 16, height $RemoveBe'5\' 7"'IFGWaAJTT$  (1.702 m), weight 93.35 kg (205 lb 12.8 oz), SpO2 100 %. Physical Exam: General: obese black female who is laying in bed in NAD, drowsy HEENT: head is normocephalic, atraumatic.  Sclera are noninjected.  PERRL.  Ears and nose without any masses or lesions.  Mouth is dry Heart: regular, rate, and rhythm.  Normal s1,s2. No obvious murmurs, gallops, or rubs noted.  Palpable radial and pedal pulses bilaterally Lungs: CTAB, no wheezes, rhonchi, or rales noted.  Respiratory  effort nonlabored Skin: warm and dry but with significant peeling dry skin.  She has a 4x8cm unstageable left buttock wound with some purulent drainage present.  This is 100% necrotic eschar.  Please see procedure noted for further details regarding this wound. Psych: drowsy and mumbles her speech.    Results for orders placed or performed during the hospital encounter of 10/14/15 (from the past 48 hour(s))  Sodium, urine, random     Status: None   Collection Time: 10/14/15  8:33 AM  Result Value Ref Range   Sodium, Ur 99 mmol/L  Creatinine, urine, random     Status: None   Collection Time: 10/14/15  8:33 AM  Result Value Ref Range   Creatinine, Urine 31.83 mg/dL  Blood Culture (routine x 2)     Status: None (Preliminary result)   Collection Time: 10/14/15  5:20 PM  Result Value Ref Range   Specimen Description BLOOD RIGHT ARM    Special Requests IN PEDIATRIC BOTTLE 4CC    Culture NO GROWTH < 24 HOURS    Report Status PENDING   Blood Culture (routine x 2)     Status: None (Preliminary result)   Collection Time: 10/14/15  5:30 PM  Result Value Ref Range   Specimen Description BLOOD RIGHT HAND    Special Requests BOTTLES DRAWN AEROBIC AND ANAEROBIC 2CC    Culture NO GROWTH < 24 HOURS    Report Status PENDING   Comprehensive metabolic panel     Status: Abnormal   Collection Time: 10/14/15  5:35 PM  Result Value Ref Range    Sodium 127 (L) 135 - 145 mmol/L   Potassium 4.2 3.5 - 5.1 mmol/L   Chloride 92 (L) 101 - 111 mmol/L   CO2 25 22 - 32 mmol/L   Glucose, Bld 131 (H) 65 - 99 mg/dL   BUN 14 6 - 20 mg/dL   Creatinine, Ser 1.52 (H) 0.44 - 1.00 mg/dL   Calcium 8.3 (L) 8.9 - 10.3 mg/dL   Total Protein 6.7 6.5 - 8.1 g/dL   Albumin 2.0 (L) 3.5 - 5.0 g/dL   AST 19 15 - 41 U/L   ALT 13 (L) 14 - 54 U/L   Alkaline Phosphatase 93 38 - 126 U/L   Total Bilirubin 0.6 0.3 - 1.2 mg/dL   GFR calc non Af Amer 33 (L) >60 mL/min   GFR calc Af Amer 38 (L) >60 mL/min    Comment: (NOTE) The eGFR has been calculated using the CKD EPI equation. This calculation has not been validated in all clinical situations. eGFR's persistently <60 mL/min signify possible Chronic Kidney Disease.    Anion gap 10 5 - 15  CBC WITH DIFFERENTIAL     Status: Abnormal   Collection Time: 10/14/15  5:35 PM  Result Value Ref Range   WBC 12.6 (H) 4.0 - 10.5 K/uL   RBC 3.38 (L) 3.87 - 5.11 MIL/uL   Hemoglobin 8.2 (L) 12.0 - 15.0 g/dL   HCT 26.0 (L) 36.0 - 46.0 %   MCV 76.9 (L) 78.0 - 100.0 fL   MCH 24.3 (L) 26.0 - 34.0 pg   MCHC 31.5 30.0 - 36.0 g/dL   RDW 19.7 (H) 11.5 - 15.5 %   Platelets 454 (H) 150 - 400 K/uL   Neutrophils Relative % 65 %   Neutro Abs 8.3 (H) 1.7 - 7.7 K/uL   Lymphocytes Relative 12 %   Lymphs Abs 1.5 0.7 - 4.0 K/uL   Monocytes Relative 16 %  Monocytes Absolute 2.0 (H) 0.1 - 1.0 K/uL   Eosinophils Relative 6 %   Eosinophils Absolute 0.8 (H) 0.0 - 0.7 K/uL   Basophils Relative 1 %   Basophils Absolute 0.1 0.0 - 0.1 K/uL  I-Stat CG4 Lactic Acid, ED  (not at  [facility])     Status: Abnormal   Collection Time: 10/14/15  6:02 PM  Result Value Ref Range   Lactic Acid, Venous 2.26 (HH) 0.5 - 2.0 mmol/L   Comment NOTIFIED PHYSICIAN   Urinalysis, Routine w reflex microscopic (not at [facility])     Status: Abnormal   Collection Time: 10/14/15  8:07 PM  Result Value Ref Range   Color, Urine YELLOW YELLOW   APPearance TURBID (A)  CLEAR   Specific Gravity, Urine 1.010 1.005 - 1.030   pH 7.0 5.0 - 8.0   Glucose, UA NEGATIVE NEGATIVE mg/dL   Hgb urine dipstick MODERATE (A) NEGATIVE   Bilirubin Urine NEGATIVE NEGATIVE   Ketones, ur NEGATIVE NEGATIVE mg/dL   Protein, ur NEGATIVE NEGATIVE mg/dL   Urobilinogen, UA 0.2 0.0 - 1.0 mg/dL   Nitrite POSITIVE (A) NEGATIVE   Leukocytes, UA LARGE (A) NEGATIVE  Urine culture     Status: None (Preliminary result)   Collection Time: 10/14/15  8:07 PM  Result Value Ref Range   Specimen Description URINE, CLEAN CATCH    Special Requests NONE    Culture TOO YOUNG TO READ    Report Status PENDING   Urine microscopic-add on     Status: Abnormal   Collection Time: 10/14/15  8:07 PM  Result Value Ref Range   Squamous Epithelial / LPF MANY (A) RARE   WBC, UA TOO NUMEROUS TO COUNT <3 WBC/hpf   RBC / HPF 11-20 <3 RBC/hpf   Bacteria, UA FEW (A) RARE  I-Stat CG4 Lactic Acid, ED  (not at  [facility])     Status: None   Collection Time: 10/14/15  9:17 PM  Result Value Ref Range   Lactic Acid, Venous 1.33 0.5 - 2.0 mmol/L  Comprehensive metabolic panel     Status: Abnormal   Collection Time: 10/15/15  4:20 AM  Result Value Ref Range   Sodium 130 (L) 135 - 145 mmol/L   Potassium 4.4 3.5 - 5.1 mmol/L   Chloride 96 (L) 101 - 111 mmol/L   CO2 24 22 - 32 mmol/L   Glucose, Bld 191 (H) 65 - 99 mg/dL   BUN 17 6 - 20 mg/dL   Creatinine, Ser 1.80 (H) 0.44 - 1.00 mg/dL   Calcium 7.9 (L) 8.9 - 10.3 mg/dL   Total Protein 5.8 (L) 6.5 - 8.1 g/dL   Albumin 1.6 (L) 3.5 - 5.0 g/dL   AST 14 (L) 15 - 41 U/L   ALT 12 (L) 14 - 54 U/L   Alkaline Phosphatase 84 38 - 126 U/L   Total Bilirubin 1.0 0.3 - 1.2 mg/dL   GFR calc non Af Amer 27 (L) >60 mL/min   GFR calc Af Amer 31 (L) >60 mL/min    Comment: (NOTE) The eGFR has been calculated using the CKD EPI equation. This calculation has not been validated in all clinical situations. eGFR's persistently <60 mL/min signify possible Chronic Kidney Disease.     Anion gap 10 5 - 15  CBC     Status: Abnormal   Collection Time: 10/15/15  4:20 AM  Result Value Ref Range   WBC 13.0 (H) 4.0 - 10.5 K/uL   RBC 2.93 (L) 3.87 -  5.11 MIL/uL   Hemoglobin 7.0 (L) 12.0 - 15.0 g/dL   HCT 22.6 (L) 36.0 - 46.0 %   MCV 77.1 (L) 78.0 - 100.0 fL   MCH 23.9 (L) 26.0 - 34.0 pg   MCHC 31.0 30.0 - 36.0 g/dL   RDW 19.8 (H) 11.5 - 15.5 %   Platelets 390 150 - 400 K/uL  Glucose, capillary     Status: Abnormal   Collection Time: 10/15/15  7:14 AM  Result Value Ref Range   Glucose-Capillary 141 (H) 65 - 99 mg/dL  Glucose, capillary     Status: Abnormal   Collection Time: 10/15/15 12:13 PM  Result Value Ref Range   Glucose-Capillary 188 (H) 65 - 99 mg/dL   Dg Chest Port 1 View  10/14/2015  CLINICAL DATA:  Sepsis. EXAM: PORTABLE CHEST 1 VIEW COMPARISON:  09/26/2015 FINDINGS: The heart is enlarged but stable. A left PICC line is in place. The right PICC line has been removed. Low lung volumes with streaky bibasilar atelectasis and basilar scarring changes. Stable eventration of the right hemidiaphragm. The bony structures are intact. IMPRESSION: Low lung volumes with vascular crowding and bibasilar atelectasis. Left PICC line in good position. Electronically Signed   By: Marijo Sanes M.D.   On: 10/14/2015 18:24   Dg Abd 2 Views  10/14/2015  CLINICAL DATA:  Constipation.  No abdominal pain EXAM: ABDOMEN - 2 VIEW COMPARISON:  None. FINDINGS: No abnormal stool retention. Bowel gas pattern is nonobstructive. No concerning intra-abdominal mass effect or calcification. Lung bases are clear. There is a left-sided central line with tip near the upper cavoatrial junction. IMPRESSION: No abnormal stool retention.  Nonobstructive bowel gas pattern. Electronically Signed   By: Monte Fantasia M.D.   On: 10/14/2015 23:35       Assessment/Plan 1. 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  D/w the patient and her  husband and sister who are present.  They are all agreeable.  -hydrotherapy and santyl along with BID WD dressing changes daily -she will need wound center follow up after discharge. -air overlay mattress ordered -q2 hours turns since patient is unable to mobilize.  Can be up in chair as well.  Omer Monter E 10/15/2015, 3:05 PM Pager: [phone]

## 2015-10-15 NOTE — Care Management Note (Signed)
Case Management Note  Patient Details  Name: Nani SkillernHelen M Komorowski MRN: [id number] Date of Birth: 08/01/1941  Subjective/Objective:                    Action/Plan:  Initial UR completed  Expected Discharge Date:                  Expected Discharge Plan:  Skilled Nursing Facility  In-House Referral:  Clinical Social Work  Discharge planning Services     Post Acute Care Choice:    Choice offered to:     DME Arranged:    DME Agency:     HH Arranged:    HH Agency:     Status of Service:  In process, will continue to follow  Medicare Important Message Given:    Date Medicare IM Given:    Medicare IM give by:    Date Additional Medicare IM Given:    Additional Medicare Important Message give by:     If discussed at Long Length of Stay Meetings, dates discussed:    Additional Comments:  [name], Nicolo Tomko Marie, RN 10/15/2015, 11:18 AM

## 2015-10-15 NOTE — Consult Note (Signed)
[facility]       Reason for Consult: infected decubitus ulcer    Referring Physician: Dr. Conley Cobb  Principal Problem:   Decubitus ulcer of left buttock, unstageable ([place]) Active Problems:   Type II diabetes mellitus ([facility])   Essential hypertension, benign   Obesity   Anxiety   Anemia of chronic disease   Hyponatremia   Constipation   Leukocytosis   S/P AKA (above knee amputation) unilateral ([place])   . arformoterol  15 mcg Nebulization BID  . atorvastatin  80 mg Oral Daily  . clopidogrel  75 mg Oral Daily  . diltiazem  120 mg Oral Daily  . heparin  5,000 Units Subcutaneous 3 times per day  . hydrocerin   Topical BID  . insulin aspart  0-9 Units Subcutaneous TID WC  . lactulose  30 g Oral BID  . loratadine  10 mg Oral Daily  . piperacillin-tazobactam (ZOSYN)  IV  3.375 g Intravenous Q8H  . sodium chloride  3 mL Intravenous Q12H    Recommendations: Ceftriaxone and flagyl Surgery to be consulted for debridement   Assessment: She has an elevated WBC, elevated CRP and ESR and pus in her ulcer.  No obvious probing to bone though may extend down, will await surgical intervention to see how deep it extends and if long-term IV antibiotics required.  She was on daptomycin so unlikely to be gram positive and GNR growing from culture.  For her MRSA infection, the TEE is negative for vegetation, blood cultures clear and pathology report notes good margins of amputated leg, though does not comment on bone.  Therefore I do not feel continued coverage with daptomycin is indicated with more than 2 weeks of gram positive coverage.     Antibiotics: daptomycin   HPI: Lindsay Cobb is a 74 y.o. female with DM, htn, recent MRSA bacteremia from an infected patella s/p AKA with good gross margins who was sent in by Dr. Dellia Cobb for an infected-appearing ulcer.  She has 2 areas of stage 2 pressure ulcers on right and the left with fluctuance, unstageable at this  time, copious pus expressed. Has had no associated fever or chills.  Has not been hurting.   Previous hospitalization reviewed, had AKA for patellar ostemyelitis, surgical pathology reviewed and noted good gross margins, repeat blood cultures remained negative.  CXR independently reviewed and no opacity or concerns for other infection.   Review of Systems:  Constitutional: negative for fevers and chills Gastrointestinal: negative for nausea and diarrhea Genitourinary: negative for dysuria All other systems reviewed and are negative   Past Medical History  Diagnosis Date  . Diabetes mellitus     takes Januvia,Metformin,and Glipizide daily  . Hyperlipidemia     takes Lovastatin nightly  . Anemia     takes Ferrous Gluconate daily  . Peripheral edema     takes Furosemide daily as needed  . Depression     takes Tofranil daily  . Hypertension     takes Amlodipine,Atenolol,and Benazepril daily  . Arthritis   . Joint pain   . Joint swelling   . Cataracts, bilateral     immature  . Infection of skin of knee     left  . Decubitus ulcer of buttock, unstageable ([place]) 10/2015    Social History  Substance Use Topics  . Smoking status: Never Smoker   . Smokeless tobacco: Never Used  . Alcohol Use: No    Family History  Problem Relation  Age of Onset  . Hypertension Mother   . Diabetes Mother   . Hypertension Father     No Known Allergies  Physical Exam: Constitutional: in no apparent distress  Filed Vitals:   10/15/15 1006  BP: 96/47  Pulse:   Temp:   Resp:    EYES: anicteric ENMT: no thrush Cardiovascular: Cor RRR and No murmurs Respiratory: clear; GI: Bowel sounds are normal Musculoskeletal: peripheral pulses normal, no pedal edema, no clubbing or cyanosis Skin: negatives: no rash Hematologic: no cervical lad  Lab Results  Component Value Date   WBC 13.0* 10/15/2015   HGB 7.0* 10/15/2015   HCT 22.6* 10/15/2015   MCV 77.1* 10/15/2015   PLT 390 10/15/2015      Lab Results  Component Value Date   CREATININE 1.80* 10/15/2015   BUN 17 10/15/2015   NA 130* 10/15/2015   K 4.4 10/15/2015   CL 96* 10/15/2015   CO2 24 10/15/2015    Lab Results  Component Value Date   ALT 12* 10/15/2015   AST 14* 10/15/2015   ALKPHOS 84 10/15/2015     Microbiology: Recent Results (from the past 240 hour(s))  Wound culture     Status: None (Preliminary result)   Collection Time: 10/13/15  4:10 PM  Result Value Ref Range Status   Specimen Description WOUND LEFT BUTTOCK  Final   Special Requests NONE  Final   Gram Stain PENDING  Incomplete   Culture   Final    ABUNDANT GRAM NEGATIVE RODS Performed at [facility]    Report Status PENDING  Incomplete  Urine culture     Status: None (Preliminary result)   Collection Time: 10/14/15  8:07 PM  Result Value Ref Range Status   Specimen Description URINE, CLEAN CATCH  Final   Special Requests NONE  Final   Culture TOO YOUNG TO READ  Final   Report Status PENDING  Incomplete    Lindsay Cobb, Lindsay Cobb, [place] [facility] Infect[facility] Disease Lindsay Cobb [email or web address] [phone] pager  [phone] cell 10/15/2015, 10:58 AM

## 2015-10-15 NOTE — Clinical Social Work Note (Signed)
CSW received consult for patient from [facility], CSW to complete assessment and facilitate discharge planning.  Ervin KnackEric R. Lem Peary, MSW, LCSWA [phone] 10/15/2015 7:00 PM

## 2015-10-15 NOTE — Progress Notes (Signed)
[facility] PROGRESS NOTE  TANYA CROTHERS RFX:[id number] DOB: Mar 06, 1941 DOA: 10/14/2015 PCP: Vic Blackbird, MD  Assessment/Plan:  Principal Problem:   Decubitus ulcer of left buttock, unstageable ([place]): Will need debridement. Await recommendations.  Outpatient notes report greenish drainage. I see none today and there is no odor definite fluctuance or cellulitis about the area  Active Problems: Possible UTI: Urinalysis with leukocyte esterase and nitrites, but many squamous epithelial cells. Urine culture pending. Admitting physician ordered vancomycin and Zosyn. Await infectious disease consult. Discussed with Dr. Linus Salmons . History of MRSA bacteremia and septic knee, status post left AKA: Had been on daptomycin at skilled nursing facility. Patient still has staples and sutures in. She had her operation on 10/23. Will leave message with Dr. due to to see when staples and sutures can come out. Stump looks clean.    Type II diabetes mellitus ([place]): Metformin held due to renal failure. Continue sliding scale insulin.    Essential hypertension, benign: Blood pressure control. Several medications held.    Obesity    Anxiety: Patient was started on several medications on admission which were not the same at last discharge, including Seroquel, Remeron, Zoloft, Klonopin. Have asked pharmacy to clarify home regimen. We'll hold these for now.    Anemia of chronic disease: Had anemia panel last admission. Hemoglobin has dropped with hydration, may be dilution. Will monitor. Type and screen. Transfuse if any lower.    Hyponatremia; mild. Monitor.    Constipation: Patient was discharged on twice a day lactulose last admission for hyperammonemia. We will stop current regimen and resume lactulose. patient denies vomiting today. Abdominal films without obstruction or significant impaction     Leukocytosis: Patient was started on prednisone on admission but I don't see that she was discharged on  this medication. CRP and ESR elevation both suggestive of infection. Will stop prednisone and ask pharmacy to clarify  Dry skin: Patient has desquamation on both arms. She reports this started yesterday. She is constantly scratching. Will add antihistamine and emollients. Not diffuse enough to really suggest drug rash, but will monitor.  Status post recent left AKA on 10/23. See above  Confusion/encephalopathy: This was an issue during last hospitalization. History seems somewhat unreliable   Code Status:  full Family Communication:   none at bedside  Disposition Plan:  back to skilled nursing facility when stable   Consultants:   wound care  Infectious disease   Procedures:     Antibiotics:   vancomycin and Zosyn   HPI/Subjective:  no complaints. When asked, reports her rash started yesterday. She is she. No vomiting or abdominal pain.   Objective: Filed Vitals:   10/14/15 2244  BP: 117/93  Pulse: 94  Temp: 98.5 F (36.9 C)  Resp:     Intake/Output Summary (Last 24 hours) at 10/15/15 0829 Last data filed at 10/15/15 0626  Gross per 24 hour  Intake 1376.25 ml  Output      0 ml  Net 1376.25 ml   Filed Weights   10/14/15 2244  Weight: 93.35 kg (205 lb 12.8 oz)    Exam:   General:   alert. Nontoxic appearing. Scratching her arms. Oriented to person and place   Cardiovascular:  regular rate rhythm without murmurs gallops rubs   Respiratory: Clear to auscultation bilaterally without wheezes rhonchi or rales   Abdomen:  soft nontender nondistended   Ext:  no clubbing cyanosis or edema. Left AKA stump with staples and sutures, no drainage or cellulitis  Neurologic:  Nonfocal  Skin: Severe flaking and desquamation of both arms. Dry skin elsewhere but seems limited to both arms   Basic Metabolic Panel:  Recent Labs Lab 10/10/15 0500 10/13/15 1500 10/14/15 1735 10/15/15 0420  NA 136 129* 127* 130*  K 4.0 5.0 4.2 4.4  CL 99* 95* 92* 96*  CO2 $Re'29 25 25  24  'fCJ$ GLUCOSE 108* 157* 131* 191*  BUN $Re'12 16 14 17  'VRy$ CREATININE 1.02* 1.38* 1.52* 1.80*  CALCIUM 8.0* 8.2* 8.3* 7.9*   Liver Function Tests:  Recent Labs Lab 10/10/15 0500 10/13/15 1500 10/14/15 1735 10/15/15 0420  AST $Re'24 20 19 'XwB$ 14*  ALT 14 14 13* 12*  ALKPHOS 87 89 93 84  BILITOT 0.7 0.5 0.6 1.0  PROT 5.3* 6.7 6.7 5.8*  ALBUMIN 1.9* 2.1* 2.0* 1.6*   No results for input(s): LIPASE, AMYLASE in the last 168 hours. No results for input(s): AMMONIA in the last 168 hours. CBC:  Recent Labs Lab 10/10/15 0500 10/13/15 1500 10/14/15 1735 10/15/15 0420  WBC 11.8* 14.2* 12.6* 13.0*  NEUTROABS 5.8 9.7* 8.3*  --   HGB 7.4* 7.9* 8.2* 7.0*  HCT 23.0* 24.9* 26.0* 22.6*  MCV 78.2 78.8 76.9* 77.1*  PLT 293 451* 454* 390   Cardiac Enzymes:  Recent Labs Lab 10/13/15 1500  CKTOTAL 25*  CKMB 1.6   BNP (last 3 results)  Recent Labs  09/26/15 1414  BNP 48.0    ProBNP (last 3 results) No results for input(s): PROBNP in the last 8760 hours.  CBG:  Recent Labs Lab 10/08/15 1129 10/08/15 1649 10/15/15 0714  GLUCAP 115* 99 141*    Recent Results (from the past 240 hour(s))  Wound culture     Status: None (Preliminary result)   Collection Time: 10/13/15  4:10 PM  Result Value Ref Range Status   Specimen Description WOUND LEFT BUTTOCK  Final   Special Requests NONE  Final   Gram Stain PENDING  Incomplete   Culture   Final    ABUNDANT GRAM NEGATIVE RODS Performed at [facility]    Report Status PENDING  Incomplete     Studies: Dg Chest Port 1 View  10/14/2015  CLINICAL DATA:  Sepsis. EXAM: PORTABLE CHEST 1 VIEW COMPARISON:  09/26/2015 FINDINGS: The heart is enlarged but stable. A left PICC line is in place. The right PICC line has been removed. Low lung volumes with streaky bibasilar atelectasis and basilar scarring changes. Stable eventration of the right hemidiaphragm. The bony structures are intact. IMPRESSION: Low lung volumes with vascular crowding and  bibasilar atelectasis. Left PICC line in good position. Electronically Signed   By: Marijo Sanes M.D.   On: 10/14/2015 18:24   Dg Abd 2 Views  10/14/2015  CLINICAL DATA:  Constipation.  No abdominal pain EXAM: ABDOMEN - 2 VIEW COMPARISON:  None. FINDINGS: No abnormal stool retention. Bowel gas pattern is nonobstructive. No concerning intra-abdominal mass effect or calcification. Lung bases are clear. There is a left-sided central line with tip near the upper cavoatrial junction. IMPRESSION: No abnormal stool retention.  Nonobstructive bowel gas pattern. Electronically Signed   By: Monte Fantasia M.D.   On: 10/14/2015 23:35    Scheduled Meds: . arformoterol  15 mcg Nebulization BID  . atorvastatin  80 mg Oral Daily  . benztropine  0.5 mg Oral Daily  . clonazePAM  1 mg Oral BID  . clopidogrel  75 mg Oral Daily  . diltiazem  120 mg Oral Daily  . docusate sodium  100 mg Oral BID  . heparin  5,000 Units Subcutaneous 3 times per day  . insulin aspart  0-9 Units Subcutaneous TID WC  . mirtazapine  15 mg Oral QHS  . pantoprazole  40 mg Oral Daily  . piperacillin-tazobactam (ZOSYN)  IV  3.375 g Intravenous Q8H  . polyethylene glycol  17 g Oral Daily  . predniSONE  20 mg Oral BID WC  . QUEtiapine  400 mg Oral QHS  . sertraline  50 mg Oral BID  . sodium chloride  3 mL Intravenous Q12H   Continuous Infusions: . sodium chloride 1,000 mL (10/14/15 2359)    Time spent: 35 minutes  [place] [facility] [email or web address], password [facility] 10/15/2015, 8:29 AM  LOS: 1 day

## 2015-10-16 LAB — BASIC METABOLIC PANEL
Anion gap: 8 (ref 5–15)
BUN: 23 mg/dL — AB (ref 6–20)
CO2: 26 mmol/L (ref 22–32)
Calcium: 7.9 mg/dL — ABNORMAL LOW (ref 8.9–10.3)
Chloride: 98 mmol/L — ABNORMAL LOW (ref 101–111)
Creatinine, Ser: 1.99 mg/dL — ABNORMAL HIGH (ref 0.44–1.00)
GFR calc Af Amer: 27 mL/min — ABNORMAL LOW (ref >60)
GFR, EST NON AFRICAN AMERICAN: 24 mL/min — AB (ref >60)
GLUCOSE: 126 mg/dL — AB (ref 65–99)
POTASSIUM: 3.6 mmol/L (ref 3.5–5.1)
Sodium: 132 mmol/L — ABNORMAL LOW (ref 135–145)

## 2015-10-16 LAB — URINE CULTURE: Culture: 80000

## 2015-10-16 LAB — GLUCOSE, CAPILLARY
GLUCOSE-CAPILLARY: 115 mg/dL — AB (ref 65–99)
GLUCOSE-CAPILLARY: 142 mg/dL — AB (ref 65–99)
GLUCOSE-CAPILLARY: 120 mg/dL — AB (ref 65–99)
Glucose-Capillary: 138 mg/dL — ABNORMAL HIGH (ref 65–99)

## 2015-10-16 LAB — CBC WITH DIFFERENTIAL/PLATELET
Basophils Absolute: 0.1 10*3/uL (ref 0.0–0.1)
Basophils Relative: 1 %
EOS PCT: 21 %
Eosinophils Absolute: 3.3 10*3/uL — ABNORMAL HIGH (ref 0.0–0.7)
HCT: 24 % — ABNORMAL LOW (ref 36.0–46.0)
Hemoglobin: 7.7 g/dL — ABNORMAL LOW (ref 12.0–15.0)
LYMPHS ABS: 0.7 10*3/uL (ref 0.7–4.0)
LYMPHS PCT: 5 %
MCH: 24.9 pg — AB (ref 26.0–34.0)
MCHC: 32.1 g/dL (ref 30.0–36.0)
MCV: 77.7 fL — AB (ref 78.0–100.0)
MONO ABS: 1.1 10*3/uL — AB (ref 0.1–1.0)
MONOS PCT: 7 %
Neutro Abs: 10.1 10*3/uL — ABNORMAL HIGH (ref 1.7–7.7)
Neutrophils Relative %: 66 %
PLATELETS: 452 10*3/uL — AB (ref 150–400)
RBC: 3.09 MIL/uL — AB (ref 3.87–5.11)
RDW: 20 % — ABNORMAL HIGH (ref 11.5–15.5)
WBC: 15.3 10*3/uL — AB (ref 4.0–10.5)

## 2015-10-16 LAB — WOUND CULTURE

## 2015-10-16 LAB — TYPE AND SCREEN
ABO/RH(D): O POS
Antibody Screen: NEGATIVE

## 2015-10-16 MED ORDER — FERROUS SULFATE 325 (65 FE) MG PO TABS
325.0000 mg | ORAL_TABLET | Freq: Every day | ORAL | Status: DC
  Administered 2015-10-16 – 2015-10-24 (×9): 325 mg via ORAL
  Filled 2015-10-16 (×9): qty 1

## 2015-10-16 MED ORDER — LACTULOSE 10 GM/15ML PO SOLN
30.0000 g | Freq: Every day | ORAL | Status: DC
  Filled 2015-10-16: qty 45

## 2015-10-16 NOTE — Progress Notes (Signed)
Physical Therapy Wound Evaluation and Treatment Patient Details  Name: Lindsay Cobb MRN: [id number] Date of Birth: [birth year]  Today's Date: 10/16/2015 Time: 1127-1156 Time Calculation (min): 29 min  Subjective  Subjective: I remember you Patient and Family Stated Goals: Heal wound Date of Onset:  (admitted with pressure ulcer) Prior Treatments: I&D on 11/09  Pain Score:   10/10 pre-medicated  Wound Assessment  Pressure Ulcer Unstageable - Full thickness tissue loss in which the base of the ulcer is covered by slough (yellow, tan, gray, green or brown) and/or eschar (tan, brown or black) in the wound bed. (Active)  Dressing Type Gauze (Comment);Moist to dry 10/16/2015 12:40 PM  Dressing Changed;Clean;Dry;Intact 10/16/2015 12:40 PM  Dressing Change Frequency Twice a day 10/16/2015 12:40 PM  State of Healing Eschar 10/16/2015 12:40 PM  Site / Wound Assessment Painful;Pink;Red;Yellow 10/16/2015 12:40 PM  % Wound base Red or Granulating 30% 10/16/2015 12:40 PM  % Wound base Yellow 70% 10/16/2015 12:40 PM  % Wound base Black 20% 10/14/2015 11:23 PM  Peri-wound Assessment Intact 10/16/2015 12:40 PM  Wound Length (cm) 7.2 cm 10/16/2015 12:40 PM  Wound Width (cm) 4.1 cm 10/16/2015 12:40 PM  Wound Depth (cm) 3 cm 10/16/2015 12:40 PM  Tunneling (cm) 0 09/26/2015 10:53 AM  Undermining (cm) 3.1cm from 9:00 to 3:00 10/16/2015 12:40 PM  Margins Unattached edges (unapproximated) 10/16/2015 12:40 PM  Drainage Amount Copious 10/16/2015 12:40 PM  Drainage Description Serosanguineous;Odor 10/16/2015 12:40 PM  Treatment Debridement (Selective);Hydrotherapy (Pulse lavage);Packing (Saline gauze);Tape changed;Other (Comment) 10/16/2015 12:40 PM     Santyl applied to wound bed prior to applying dressing.  Hydrotherapy Pulsed lavage therapy - wound location: Left buttock Pulsed Lavage with Suction (psi): 8 psi (4-8psi) Pulsed Lavage with Suction - Normal Saline Used: 1000 mL Pulsed Lavage Tip: Tip  with splash shield Selective Debridement Selective Debridement - Location: Left buttock Selective Debridement - Tools Used: Forceps;Scissors Selective Debridement - Tissue Removed: Eschar and slough   Wound Assessment and Plan  Wound Therapy - Assess/Plan/Recommendations Wound Therapy - Clinical Statement: Presents to hydrotherapy s/p I&D to left buttock. Will benefit from hydrotherapy with Santyl application and selective debridement to increase granulatory growth. Wound Therapy - Functional Problem List: unable to tolerate prolonged sitting due to pain and risk of progression of decubitus ulcer. Factors Delaying/Impairing Wound Healing: Diabetes Mellitus;Immobility;Multiple medical problems;Polypharmacy Hydrotherapy Plan: Debridement;Dressing change;Patient/family education;Pulsatile lavage with suction Wound Therapy - Frequency: 6X / week Wound Therapy - Follow Up Recommendations:  (per surgery) Wound Plan: see above  Wound Therapy Goals- Improve the function of patient's integumentary system by progressing the wound(s) through the phases of wound healing (inflammation - proliferation - remodeling) by: Decrease Necrotic Tissue to: 50 Decrease Necrotic Tissue - Progress: Goal set today Increase Granulation Tissue to: 50 Increase Granulation Tissue - Progress: Goal set today Improve Drainage Characteristics: Min Improve Drainage Characteristics - Progress: Goal set today Goals/treatment plan/discharge plan were made with and agreed upon by patient/family: Yes Time For Goal Achievement: 7 days Wound Therapy - Potential for Goals: Good  Goals will be updated until maximal potential achieved or discharge criteria met.  Discharge criteria: when goals achieved, discharge from hospital, MD decision/surgical intervention, no progress towards goals, refusal/missing three consecutive treatments without notification or medical reason.  GP     Candie Mile S 10/16/2015, 12:53 PM  Camille Bal [place], [place]

## 2015-10-16 NOTE — Progress Notes (Signed)
[facility] PROGRESS NOTE  Lindsay Cobb ZDG:[id number] DOB: [birth year] DOA: 10/14/2015 PCP: [name], MD  Assessment/Plan:  Principal Problem:   Decubitus ulcer of left buttock, unstageable ([facility]): s/p surgical debridement. Now getting pulse lavage. cx grew e coli sensitive to rocephin. Continue same. Appreciate surgery and ID  Active Problems: Possible UTI: Urine cx with 80K E coli colonies . History of MRSA bacteremia and septic knee, status post left AKA: no further need for daptomycin per ID. Dr. [name] evaluated and removed staples and sutures    Type II diabetes mellitus ([facility]): Metformin held due to renal failure. Continue sliding scale insulin. Reasonable control.    Essential hypertension, benign: Normotensive off medications    Obesity    Anxiety: Home medications clarified. Several erroneous entries by pharmacy tech which were stopped.    Anemia of chronic disease: Had anemia panel last admission. Hemoglobin has dropped with hydration, may be dilution. Will monitor. Type and screen. hgb 7.7 today    Hyponatremia; mild. Monitor.    Constipation: continue lactulose  dermatitis: Desquamation on arms improved today and patient is not scratching.  Status post recent left AKA on 10/23. See above  Confusion/encephalopathy: This was an issue during last hospitalization. Seems improved today.  Urinary retention: Unable to void all day yesterday. Bladder catheter with greater than 500 mL. Foley catheter placed.  Acute kidney injury: Likely from infection. Monitor.  Code Status:  full Family Communication:   none at bedside  Disposition Plan:  back to skilled nursing facility when stable   Consultants:   wound care  Infectious disease   Procedures:     Antibiotics:   ceftriaxone, flagyl  HPI/Subjective:  no complaints. When asked, reports her rash started yesterday. She is she. No vomiting or abdominal pain.   Objective: Filed Vitals:   10/16/15 1000  BP: 110/46  Pulse: 88  Temp: 98.4 F (36.9 C)  Resp:     Intake/Output Summary (Last 24 hours) at 10/16/15 1410 Last data filed at 10/16/15 1300  Gross per 24 hour  Intake 1150.83 ml  Output   1025 ml  Net 125.83 ml   Filed Weights   10/14/15 2244  Weight: 93.35 kg (205 lb 12.8 oz)    Exam:   General:   alert. More appropriate. Does not remember me from yesterday.  Cardiovascular:  regular rate rhythm without murmurs gallops rubs   Respiratory: Clear to auscultation bilaterally without wheezes rhonchi or rales   Abdomen:  soft nontender nondistended   Ext:  no clubbing cyanosis or edema. Left AKA stump dressing clean dry and intact  Neurologic: Nonfocal  Skin: Less desquamation and dryness on both arms. Buttock wound with dressing over top.   Basic Metabolic Panel:  Recent Labs Lab 10/10/15 0500 10/13/15 1500 10/14/15 1735 10/15/15 0420 10/16/15 0510  NA 136 129* 127* 130* 132*  K 4.0 5.0 4.2 4.4 3.6  CL 99* 95* 92* 96* 98*  CO2 GLUCOSE 108* 157* 131* 191* 126*  BUN 23*  CREATININE 1.02* 1.38* 1.52* 1.80* 1.99*  CALCIUM 8.0* 8.2* 8.3* 7.9* 7.9*   Liver Function Tests:  Recent Labs Lab 10/10/15 0500 10/13/15 1500 10/14/15 1735 10/15/15 0420  AST 14*  ALT 14 14 13* 12*  ALKPHOS 87 89 93 84  BILITOT 0.7 0.5 0.6 1.0  PROT 5.3* 6.7 6.7 5.8*  ALBUMIN 1.9* 2.1* 2.0* 1.6*   No results for input(s): LIPASE, AMYLASE in  the last 168 hours. No results for input(s): AMMONIA in the last 168 hours. CBC:  Recent Labs Lab 10/10/15 0500 10/13/15 1500 10/14/15 1735 10/15/15 0420 10/16/15 0510  WBC 11.8* 14.2* 12.6* 13.0* 15.3*  NEUTROABS 5.8 9.7* 8.3*  --  10.1*  HGB 7.4* 7.9* 8.2* 7.0* 7.7*  HCT 23.0* 24.9* 26.0* 22.6* 24.0*  MCV 78.2 78.8 76.9* 77.1* 77.7*  PLT 293 451* 454* 390 452*   Cardiac Enzymes:  Recent Labs Lab 10/13/15 1500  CKTOTAL 25*  CKMB 1.6   BNP (last 3 results)  Recent  Labs  09/26/15 1414  BNP 48.0    ProBNP (last 3 results) No results for input(s): PROBNP in the last 8760 hours.  CBG:  Recent Labs Lab 10/15/15 1213 10/15/15 1734 10/15/15 2235 10/16/15 0739 10/16/15 1202  GLUCAP 188* 134* 128* 115* 142*    Recent Results (from the past 240 hour(s))  Wound culture     Status: None   Collection Time: 10/13/15  4:10 PM  Result Value Ref Range Status   Specimen Description WOUND LEFT BUTTOCK  Final   Special Requests NONE  Final   Gram Stain   Final    MODERATE WBC PRESENT,BOTH PMN AND MONONUCLEAR NO SQUAMOUS EPITHELIAL CELLS SEEN FEW GRAM NEGATIVE RODS Performed at [facility]    Culture   Final    ABUNDANT ESCHERICHIA COLI Performed at [facility]    Report Status 10/16/2015 FINAL  Final   Organism ID, Bacteria ESCHERICHIA COLI  Final      Susceptibility   Escherichia coli - MIC*    AMPICILLIN >=32 RESISTANT Resistant     AMPICILLIN/SULBACTAM <=2 SENSITIVE Sensitive     CEFAZOLIN 16 RESISTANT Resistant     CEFEPIME <=1 SENSITIVE Sensitive     CEFTAZIDIME <=1 SENSITIVE Sensitive     CEFTRIAXONE <=1 SENSITIVE Sensitive     CIPROFLOXACIN >=4 RESISTANT Resistant     GENTAMICIN <=1 SENSITIVE Sensitive     IMIPENEM <=0.25 SENSITIVE Sensitive     PIP/TAZO <=4 SENSITIVE Sensitive     TOBRAMYCIN <=1 SENSITIVE Sensitive     TRIMETH/SULFA Value in next row Sensitive      <=20 SENSITIVE(NOTE)    * ABUNDANT ESCHERICHIA COLI  Blood Culture (routine x 2)     Status: None (Preliminary result)   Collection Time: 10/14/15  5:20 PM  Result Value Ref Range Status   Specimen Description BLOOD RIGHT ARM  Final   Special Requests IN PEDIATRIC BOTTLE 4CC  Final   Culture NO GROWTH 2 DAYS  Final   Report Status PENDING  Incomplete  Blood Culture (routine x 2)     Status: None (Preliminary result)   Collection Time: 10/14/15  5:30 PM  Result Value Ref Range Status   Specimen Description BLOOD RIGHT HAND  Final   Special  Requests BOTTLES DRAWN AEROBIC AND ANAEROBIC 2CC  Final   Culture NO GROWTH 2 DAYS  Final   Report Status PENDING  Incomplete  Urine culture     Status: None   Collection Time: 10/14/15  8:07 PM  Result Value Ref Range Status   Specimen Description URINE, CLEAN CATCH  Final   Special Requests NONE  Final   Culture 80,000 COLONIES/ml ESCHERICHIA COLI  Final   Report Status 10/16/2015 FINAL  Final   Organism ID, Bacteria ESCHERICHIA COLI  Final      Susceptibility   Escherichia coli - MIC*    AMPICILLIN 4 SENSITIVE Sensitive  CEFAZOLIN <=4 SENSITIVE Sensitive     CEFTRIAXONE <=1 SENSITIVE Sensitive     CIPROFLOXACIN >=4 RESISTANT Resistant     GENTAMICIN <=1 SENSITIVE Sensitive     IMIPENEM <=0.25 SENSITIVE Sensitive     NITROFURANTOIN <=16 SENSITIVE Sensitive     TRIMETH/SULFA <=20 SENSITIVE Sensitive     AMPICILLIN/SULBACTAM <=2 SENSITIVE Sensitive     PIP/TAZO <=4 SENSITIVE Sensitive     * 80,000 COLONIES/ml ESCHERICHIA COLI     Studies: Dg Chest Port 1 View  10/14/2015  CLINICAL DATA:  Sepsis. EXAM: PORTABLE CHEST 1 VIEW COMPARISON:  09/26/2015 FINDINGS: The heart is enlarged but stable. A left PICC line is in place. The right PICC line has been removed. Low lung volumes with streaky bibasilar atelectasis and basilar scarring changes. Stable eventration of the right hemidiaphragm. The bony structures are intact. IMPRESSION: Low lung volumes with vascular crowding and bibasilar atelectasis. Left PICC line in good position. Electronically Signed   By: Rudie MeyerP.  Gallerani M.D.   On: 10/14/2015 18:24   Dg Abd 2 Views  10/14/2015  CLINICAL DATA:  Constipation.  No abdominal pain EXAM: ABDOMEN - 2 VIEW COMPARISON:  None. FINDINGS: No abnormal stool retention. Bowel gas pattern is nonobstructive. No concerning intra-abdominal mass effect or calcification. Lung bases are clear. There is a left-sided central line with tip near the upper cavoatrial junction. IMPRESSION: No abnormal stool  retention.  Nonobstructive bowel gas pattern. Electronically Signed   By: Marnee SpringJonathon  Watts M.D.   On: 10/14/2015 23:35    Scheduled Meds: . cefTRIAXone (ROCEPHIN)  IV  1 g Intravenous Q24H  . collagenase   Topical Daily  . heparin  5,000 Units Subcutaneous 3 times per day  . hydrocerin   Topical BID  . imipramine  50 mg Oral TID  . insulin aspart  0-9 Units Subcutaneous TID WC  . lactulose  30 g Oral BID  . loratadine  10 mg Oral Daily  . metronidazole  500 mg Intravenous Q8H  . sodium chloride  3 mL Intravenous Q12H   Continuous Infusions: . sodium chloride 1,000 mL (10/15/15 0955)    Time spent: 35 minutes  Pearl Bents L  [facility] [email or web address], password [email or web address] 10/16/2015, 2:10 PM  LOS: 2 days

## 2015-10-16 NOTE — Progress Notes (Signed)
Patient ID: Nani SkillernHelen M Dennie, female   DOB: 05/20/1941, 74 y.o.   MRN: [id number]    Subjective: Pt much more alert today and clear speech today than yesterday  Objective: Vital signs in last 24 hours: Temp:  [98.2 F (36.8 C)-98.4 F (36.9 C)] 98.4 F (36.9 C) (11/10 1000) Pulse Rate:  [81-92] 88 (11/10 1000) Resp:  [16] 16 (11/10 0459) BP: (104-115)/(42-65) 110/46 mmHg (11/10 1000) SpO2:  [95 %-100 %] 100 % (11/10 1000) Last BM Date: 10/15/15  Intake/Output from previous day: 11/09 0701 - 11/10 0700 In: 878.5 [P.O.:480; I.V.:198.5; IV Piggyback:200] Out: 825 [Urine:825] Intake/Output this shift: Total I/O In: -  Out: 200 [Urine:200]  PE: Skin: wound is slightly cleaner on the edges today, but still overall necrotic  Lab Results:   Recent Labs  10/15/15 0420 10/16/15 0510  WBC 13.0* 15.3*  HGB 7.0* 7.7*  HCT 22.6* 24.0*  PLT 390 452*   BMET  Recent Labs  10/15/15 0420 10/16/15 0510  NA 130* 132*  K 4.4 3.6  CL 96* 98*  CO2 24 26  GLUCOSE 191* 126*  BUN 17 23*  CREATININE 1.80* 1.99*  CALCIUM 7.9* 7.9*   PT/INR No results for input(s): LABPROT, INR in the last 72 hours. CMP     Component Value Date/Time   NA 132* 10/16/2015 0510   K 3.6 10/16/2015 0510   CL 98* 10/16/2015 0510   CO2 26 10/16/2015 0510   GLUCOSE 126* 10/16/2015 0510   BUN 23* 10/16/2015 0510   CREATININE 1.99* 10/16/2015 0510   CREATININE 0.91 04/21/2015 1442   CALCIUM 7.9* 10/16/2015 0510   PROT 5.8* 10/15/2015 0420   ALBUMIN 1.6* 10/15/2015 0420   AST 14* 10/15/2015 0420   ALT 12* 10/15/2015 0420   ALKPHOS 84 10/15/2015 0420   BILITOT 1.0 10/15/2015 0420   GFRNONAA 24* 10/16/2015 0510   GFRNONAA 72 09/20/2014 1435   GFRAA 27* 10/16/2015 0510   GFRAA 83 09/20/2014 1435   Lipase  No results found for: LIPASE     Studies/Results: Dg Chest Port 1 View  10/14/2015  CLINICAL DATA:  Sepsis. EXAM: PORTABLE CHEST 1 VIEW COMPARISON:  09/26/2015 FINDINGS: The heart is  enlarged but stable. A left PICC line is in place. The right PICC line has been removed. Low lung volumes with streaky bibasilar atelectasis and basilar scarring changes. Stable eventration of the right hemidiaphragm. The bony structures are intact. IMPRESSION: Low lung volumes with vascular crowding and bibasilar atelectasis. Left PICC line in good position. Electronically Signed   By: Rudie MeyerP.  Gallerani M.D.   On: 10/14/2015 18:24   Dg Abd 2 Views  10/14/2015  CLINICAL DATA:  Constipation.  No abdominal pain EXAM: ABDOMEN - 2 VIEW COMPARISON:  None. FINDINGS: No abnormal stool retention. Bowel gas pattern is nonobstructive. No concerning intra-abdominal mass effect or calcification. Lung bases are clear. There is a left-sided central line with tip near the upper cavoatrial junction. IMPRESSION: No abnormal stool retention.  Nonobstructive bowel gas pattern. Electronically Signed   By: Marnee SpringJonathon  Watts M.D.   On: 10/14/2015 23:35    Anti-infectives: Anti-infectives    Start     Dose/Rate Route Frequency Ordered Stop   10/15/15 1115  cefTRIAXone (ROCEPHIN) 1 g in dextrose 5 % 50 mL IVPB     1 g 100 mL/hr over 30 Minutes Intravenous Every 24 hours 10/15/15 1109     10/15/15 1115  metroNIDAZOLE (FLAGYL) IVPB 500 mg     500 mg 100 mL/hr  over 60 Minutes Intravenous Every 8 hours 10/15/15 1109     10/15/15 0030  piperacillin-tazobactam (ZOSYN) IVPB 3.375 g  Status:  Discontinued     3.375 g 12.5 mL/hr over 240 Minutes Intravenous Every 8 hours 10/14/15 1755 10/15/15 1111   10/14/15 1700  piperacillin-tazobactam (ZOSYN) IVPB 3.375 g     3.375 g 100 mL/hr over 30 Minutes Intravenous  Once 10/14/15 1653 10/14/15 1824   10/14/15 1700  vancomycin (VANCOCIN) IVPB 1000 mg/200 mL premix  Status:  Discontinued     1,000 mg 200 mL/hr over 60 Minutes Intravenous  Once 10/14/15 1653 10/14/15 1656   10/14/15 1700  vancomycin (VANCOCIN) 2,000 mg in sodium chloride 0.9 % 500 mL IVPB     2,000 mg 250 mL/hr over 120  Minutes Intravenous  Once 10/14/15 1657 10/14/15 2114       Assessment/Plan  1. Stage 4 buttock wound -cont BID Dressing changes -hydrotherapy starting today -santyl with dressing changes   LOS: 2 days    Ledon Weihe E 10/16/2015, 1:06 PM Pager: [phone]

## 2015-10-17 LAB — CBC
HEMATOCRIT: 23.5 % — AB (ref 36.0–46.0)
HEMOGLOBIN: 7.3 g/dL — AB (ref 12.0–15.0)
MCH: 23.9 pg — ABNORMAL LOW (ref 26.0–34.0)
MCHC: 31.1 g/dL (ref 30.0–36.0)
MCV: 77 fL — ABNORMAL LOW (ref 78.0–100.0)
PLATELETS: 478 10*3/uL — AB (ref 150–400)
RBC: 3.05 MIL/uL — AB (ref 3.87–5.11)
RDW: 19.8 % — AB (ref 11.5–15.5)
WBC: 11.8 10*3/uL — AB (ref 4.0–10.5)

## 2015-10-17 LAB — GLUCOSE, CAPILLARY
GLUCOSE-CAPILLARY: 120 mg/dL — AB (ref 65–99)
GLUCOSE-CAPILLARY: 142 mg/dL — AB (ref 65–99)
GLUCOSE-CAPILLARY: 114 mg/dL — AB (ref 65–99)
GLUCOSE-CAPILLARY: 97 mg/dL (ref 65–99)

## 2015-10-17 LAB — BASIC METABOLIC PANEL
ANION GAP: 8 (ref 5–15)
BUN: 20 mg/dL (ref 6–20)
CALCIUM: 8 mg/dL — AB (ref 8.9–10.3)
CO2: 26 mmol/L (ref 22–32)
CREATININE: 1.61 mg/dL — AB (ref 0.44–1.00)
Chloride: 100 mmol/L — ABNORMAL LOW (ref 101–111)
GFR, EST AFRICAN AMERICAN: 36 mL/min — AB (ref >60)
GFR, EST NON AFRICAN AMERICAN: 31 mL/min — AB (ref >60)
Glucose, Bld: 112 mg/dL — ABNORMAL HIGH (ref 65–99)
Potassium: 3.4 mmol/L — ABNORMAL LOW (ref 3.5–5.1)
SODIUM: 134 mmol/L — AB (ref 135–145)

## 2015-10-17 MED ORDER — POTASSIUM CHLORIDE CRYS ER 20 MEQ PO TBCR
40.0000 meq | EXTENDED_RELEASE_TABLET | Freq: Once | ORAL | Status: AC
Start: 2015-10-17 — End: 2015-10-17
  Administered 2015-10-17: 40 meq via ORAL
  Filled 2015-10-17: qty 2

## 2015-10-17 MED ORDER — LACTULOSE 10 GM/15ML PO SOLN
30.0000 g | ORAL | Status: DC
  Administered 2015-10-21: 30 g via ORAL
  Filled 2015-10-17 (×5): qty 45

## 2015-10-17 MED ORDER — MORPHINE SULFATE (PF) 2 MG/ML IV SOLN
2.0000 mg | INTRAVENOUS | Status: DC | PRN
  Administered 2015-10-17 – 2015-10-24 (×10): 2 mg via INTRAVENOUS
  Filled 2015-10-17 (×11): qty 1

## 2015-10-17 MED ORDER — MORPHINE SULFATE (PF) 2 MG/ML IV SOLN
INTRAVENOUS | Status: AC
  Filled 2015-10-17: qty 1

## 2015-10-17 NOTE — [place] FL2 (Signed)
[place] MEDICAID FL2 LEVEL OF CARE SCREENING TOOL     IDENTIFICATION  Patient Name: Lindsay Cobb Birthdate: 04/04/1941 Sex: female Admission Date (Current Location): 10/14/2015  [facility] [place]   Facility and Address:  [facility] [place], [address], [place], [place] [address]      Provider Number: [id number]  Attending Physician Name and Address:  Christiane Haorinna L Sullivan, MD  Relative Name and Phone Number:     Reesa Chewlex Bergh, Spouse [phone]       Current Level of Care: Hospital Recommended Level of Care: Skilled Nursing Facility Prior Approval Number:    Date Approved/Denied:   PASRR Number: [id number]  Discharge Plan: SNF    Current Diagnoses: Patient Active Problem List   Diagnosis Date Noted  . S/P AKA (above knee amputation) unilateral (HCC) 10/15/2015  . Decubitus skin ulcer 10/14/2015  . Hyponatremia 10/14/2015  . Constipation 10/14/2015  . Leukocytosis 10/14/2015  . Pyrexia   . Bacteremia   . Acute kidney injury (HCC)   . Hyperkalemia   . Idiopathic hypotension   . Acute encephalopathy   . Atelectasis 09/27/2015  . Bacterial infection of knee joint (HCC) 09/27/2015  . Staphylococcus aureus bacteremia with sepsis (HCC)   . MRSA bacteremia   . Chronic osteomyelitis of right femur (HCC)   . Subacute osteomyelitis of right tibia (HCC)   . Staphylococcus aureus bacteremia 09/26/2015  . Acute renal failure (HCC) 09/26/2015  . Absolute anemia   . Decubitus ulcer of left buttock, unstageable (HCC) 09/23/2015  . HCAP (healthcare-associated pneumonia) 09/22/2015  . Hypokalemia 09/22/2015  . Open knee wound 09/10/2015  . Wound dehiscence, surgical 08/15/2015  . S/P ORIF (open reduction internal fixation) fracture 07/11/2015  . Callus of foot 05/21/2014  . Anemia of chronic disease 03/15/2013  . Type II diabetes mellitus (HCC) 07/20/2012  . Essential hypertension, benign 07/20/2012  . Obesity 07/20/2012  .  Anxiety 07/20/2012  . Peripheral edema 07/20/2012    Orientation ACTIVITIES/SOCIAL BLADDER RESPIRATION    Self, Place    Incontinent Normal  BEHAVIORAL SYMPTOMS/MOOD NEUROLOGICAL Alert to person and place. BOWEL NUTRITION STATUS      Incontinent Diet (Carb modified)  PHYSICIAN VISITS COMMUNICATION OF NEEDS Height & Weight Skin    Verbally 5\' 10"  (177.8 cm) 207 lbs. Surgical wounds, PU Stage and Appropriate Care PU Stage 1 Dressing:  (PRN) PU Stage 2 Dressing: BID      AMBULATORY STATUS RESPIRATION    Assist extensive Normal      Personal Care Assistance Level of Assistance  Bathing, Feeding, Dressing Bathing Assistance: Limited assistance Feeding assistance: Limited assistance Dressing Assistance: Limited assistance      Functional Limitations Info  Speech     Speech Info: Impaired       SPECIAL CARE FACTORS FREQUENCY  PT (By licensed PT)    5X a week               Additional Factors Info  Code Status Code Status Info: Full Allergies Info: NKA           Current Medications (10/17/2015): Current Facility-Administered Medications  Medication Dose Route Frequency Provider Last Rate Last Dose  . 0.9 %  sodium chloride infusion  1,000 mL Intravenous Continuous Christiane Haorinna L Sullivan, MD 10 mL/hr at 10/15/15 0955 1,000 mL at 10/15/15 0955  . albuterol (PROVENTIL) (2.5 MG/3ML) 0.083% nebulizer solution 3 mL  3 mL Inhalation Q4H PRN Clydie Braunondell A Smith, MD      .  cefTRIAXone (ROCEPHIN) 1 g in dextrose 5 % 50 mL IVPB  1 g Intravenous Q24H Gardiner Barefoot, MD   1 g at 10/17/15 1042  . collagenase (SANTYL) ointment   Topical Daily Barnetta Chapel, PA-C   1 application at 10/17/15 1044  . ferrous sulfate tablet 325 mg  325 mg Oral Q breakfast Christiane Ha, MD   325 mg at 10/17/15 1042  . haloperidol lactate (HALDOL) injection 1 mg  1 mg Intravenous Q6H PRN Christiane Ha, MD   1 mg at 10/15/15 1532  . heparin injection 5,000 Units  5,000 Units Subcutaneous 3 times  per day Clydie Braun, MD   5,000 Units at 10/17/15 1249  . hydrocerin (EUCERIN) cream   Topical BID Christiane Ha, MD   1 application at 10/17/15 1045  . imipramine (TOFRANIL) tablet 50 mg  50 mg Oral TID Christiane Ha, MD   50 mg at 10/17/15 1528  . insulin aspart (novoLOG) injection 0-9 Units  0-9 Units Subcutaneous TID WC Clydie Braun, MD   1 Units at 10/17/15 1250  . ipratropium-albuterol (DUONEB) 0.5-2.5 (3) MG/3ML nebulizer solution 3 mL  3 mL Nebulization Q6H PRN Clydie Braun, MD      . Melene Muller ON 10/19/2015] lactulose (CHRONULAC) 10 GM/15ML solution 30 g  30 g Oral QODAY Christiane Ha, MD      . loratadine (CLARITIN) tablet 10 mg  10 mg Oral Daily Christiane Ha, MD   10 mg at 10/17/15 1042  . metroNIDAZOLE (FLAGYL) IVPB 500 mg  500 mg Intravenous Q8H Gardiner Barefoot, MD   500 mg at 10/17/15 1150  . morphine 2 MG/ML injection 2 mg  2 mg Intravenous Q4H PRN Christiane Ha, MD   2 mg at 10/17/15 1117  . nitroGLYCERIN (NITROSTAT) SL tablet 0.4 mg  0.4 mg Sublingual Q5 min PRN Clydie Braun, MD      . ondansetron (ZOFRAN) tablet 4 mg  4 mg Oral Q6H PRN Clydie Braun, MD       Or  . ondansetron (ZOFRAN) injection 4 mg  4 mg Intravenous Q6H PRN Rondell A Katrinka Blazing, MD      . sodium chloride 0.9 % injection 10-40 mL  10-40 mL Intracatheter PRN Clydie Braun, MD   10 mL at 10/15/15 0422  . sodium chloride 0.9 % injection 3 mL  3 mL Intravenous Q12H Clydie Braun, MD   3 mL at 10/14/15 2359  . sodium phosphate (FLEET) 7-19 GM/118ML enema 1 enema  1 enema Rectal Once PRN Clydie Braun, MD       Do not use this list as official medication orders. Please verify with discharge summary.  Discharge Medications:   Medication List    ASK your doctor about these medications        amLODipine 10 MG tablet  Commonly known as:  NORVASC  TAKE 1 TABLET EVERY DAY     aspirin EC 325 MG tablet  Take 1 tablet (325 mg total) by mouth daily.     atenolol 50 MG tablet   Commonly known as:  TENORMIN  Take 1 tablet (50 mg total) by mouth daily.     benazepril 20 MG tablet  Commonly known as:  LOTENSIN  TAKE 1 TABLET EVERY DAY     ciprofloxacin 500 MG tablet  Commonly known as:  CIPRO  Take 500 mg by mouth 2 (two) times daily. Give twice daily for 10  days - Last dose should be 10/23/2015 at 9AM     DAPTOmycin 900 mg in sodium chloride 0.9 % 100 mL  Inject 900 mg into the vein daily.     ferrous sulfate 325 (65 FE) MG tablet  Take 325 mg by mouth daily with breakfast.     furosemide 20 MG tablet  Commonly known as:  LASIX  Take 20 mg by mouth daily.     glucose blood test strip  Commonly known as:  ACCU-CHEK AVIVA  Use to monitor FSBS 1x daily. Dx: E11.9     imipramine 50 MG tablet  Commonly known as:  TOFRANIL  Take 1 tablet (50 mg total) by mouth 3 (three) times daily.     JANUVIA 25 MG tablet  Generic drug:  sitaGLIPtin  TAKE ONE TABLET BY MOUTH ONCE DAILY     lactulose 10 GM/15ML solution  Commonly known as:  CHRONULAC  Take 30 mLs (20 g total) by mouth 2 (two) times daily.     lovastatin 10 MG tablet  Commonly known as:  MEVACOR  Take 1 tablet (10 mg total) by mouth at bedtime.     metFORMIN 1000 MG tablet  Commonly known as:  GLUCOPHAGE  TAKE 1 TABLET TWICE DAILY WITH A MEAL     oxyCODONE 5 MG immediate release tablet  Commonly known as:  Oxy IR/ROXICODONE  Take 1-3 tablets (5-15 mg total) by mouth every 4 (four) hours as needed.     potassium chloride SA 20 MEQ tablet  Commonly known as:  K-DUR,KLOR-CON  Take 2 tablets (40 mEq total) by mouth 2 (two) times daily.        Relevant Imaging Results:  Relevant Lab Results:  Recent Labs    Additional Information From [facility]  Lindsay Cobb, [place], [place]

## 2015-10-17 NOTE — Progress Notes (Signed)
[facility]   Reason for visit: Follow up on infected decubitus ulcer  Interval History: she is getting hydrotherapy, growing E coli.  Afebrile and WBC improving.   Physical Exam: Constitutional:  Filed Vitals:   10/17/15 1053  BP: 129/58  Pulse: 85  Temp: 97.9 F (36.6 C)  Resp: 16   patient appears in NAD Unable to exam on hydrotherapy  Review of Systems: Constitutional: negative for chills Gastrointestinal: negative for diarrhea  Lab Results  Component Value Date   WBC 11.8* 10/17/2015   HGB 7.3* 10/17/2015   HCT 23.5* 10/17/2015   MCV 77.0* 10/17/2015   PLT 478* 10/17/2015    Lab Results  Component Value Date   CREATININE 1.61* 10/17/2015   BUN 20 10/17/2015   NA 134* 10/17/2015   K 3.4* 10/17/2015   CL 100* 10/17/2015   CO2 26 10/17/2015    Lab Results  Component Value Date   ALT 12* 10/15/2015   AST 14* 10/15/2015   ALKPHOS 84 10/15/2015     Microbiology: Recent Results (from the past 240 hour(s))  Wound culture     Status: None   Collection Time: 10/13/15  4:10 PM  Result Value Ref Range Status   Specimen Description WOUND LEFT BUTTOCK  Final   Special Requests NONE  Final   Gram Stain   Final    MODERATE WBC PRESENT,BOTH PMN AND MONONUCLEAR NO SQUAMOUS EPITHELIAL CELLS SEEN FEW GRAM NEGATIVE RODS Performed at [facility]    Culture   Final    ABUNDANT ESCHERICHIA COLI Performed at [facility]    Report Status 10/16/2015 FINAL  Final   Organism ID, Bacteria ESCHERICHIA COLI  Final      Susceptibility   Escherichia coli - MIC*    AMPICILLIN >=32 RESISTANT Resistant     AMPICILLIN/SULBACTAM <=2 SENSITIVE Sensitive     CEFAZOLIN 16 RESISTANT Resistant     CEFEPIME <=1 SENSITIVE Sensitive     CEFTAZIDIME <=1 SENSITIVE Sensitive     CEFTRIAXONE <=1 SENSITIVE Sensitive     CIPROFLOXACIN >=4 RESISTANT Resistant     GENTAMICIN <=1 SENSITIVE Sensitive     IMIPENEM <=0.25 SENSITIVE Sensitive    PIP/TAZO <=4 SENSITIVE Sensitive     TOBRAMYCIN <=1 SENSITIVE Sensitive     TRIMETH/SULFA Value in next row Sensitive      <=20 SENSITIVE(NOTE)    * ABUNDANT ESCHERICHIA COLI  Blood Culture (routine x 2)     Status: None (Preliminary result)   Collection Time: 10/14/15  5:20 PM  Result Value Ref Range Status   Specimen Description BLOOD RIGHT ARM  Final   Special Requests IN PEDIATRIC BOTTLE 4CC  Final   Culture NO GROWTH 2 DAYS  Final   Report Status PENDING  Incomplete  Blood Culture (routine x 2)     Status: None (Preliminary result)   Collection Time: 10/14/15  5:30 PM  Result Value Ref Range Status   Specimen Description BLOOD RIGHT HAND  Final   Special Requests BOTTLES DRAWN AEROBIC AND ANAEROBIC 2CC  Final   Culture NO GROWTH 2 DAYS  Final   Report Status PENDING  Incomplete  Urine culture     Status: None   Collection Time: 10/14/15  8:07 PM  Result Value Ref Range Status   Specimen Description URINE, CLEAN CATCH  Final   Special Requests NONE  Final   Culture 80,000 COLONIES/ml ESCHERICHIA COLI  Final   Report Status 10/16/2015 FINAL  Final   Organism ID, Bacteria ESCHERICHIA COLI  Final      Susceptibility   Escherichia coli - MIC*    AMPICILLIN 4 SENSITIVE Sensitive     CEFAZOLIN <=4 SENSITIVE Sensitive     CEFTRIAXONE <=1 SENSITIVE Sensitive     CIPROFLOXACIN >=4 RESISTANT Resistant     GENTAMICIN <=1 SENSITIVE Sensitive     IMIPENEM <=0.25 SENSITIVE Sensitive     NITROFURANTOIN <=16 SENSITIVE Sensitive     TRIMETH/SULFA <=20 SENSITIVE Sensitive     AMPICILLIN/SULBACTAM <=2 SENSITIVE Sensitive     PIP/TAZO <=4 SENSITIVE Sensitive     * 80,000 COLONIES/ml ESCHERICHIA COLI    Impression:  1. Decubitus ulcer, infected, likely polymicrobial.  2. History of Staph aureus  Plan: 1. Continue with ceftriaxone and flagyl, I would plan IV therapy for at least 2 weeks due to necrotic tissue, extensive infection.   2.  Has completed Staph aureus treatment

## 2015-10-17 NOTE — Clinical Social Work Note (Signed)
Clinical Social Work Assessment  Patient Details  Name: Lindsay Cobb MRN: [id number] Date of Birth: 01/23/1941  Date of referral:  10/17/15               Reason for consult:  Facility Placement                Permission sought to share information with:  Family Supports, Magazine features editoracility Contact Representative Permission granted to share information::  Yes, Verbal Permission Granted  Name::     Lindsay Cobb patient's husband  Agency::  SNF admissions  Relationship::     Contact Information:     Housing/Transportation Living arrangements for the past 2 months:  Skilled Nursing Facility Source of Information:  Spouse, Patient Patient Interpreter Needed:  None Criminal Activity/Legal Involvement Pertinent to Current Situation/Hospitalization:  No - Comment as needed Significant Relationships:  Spouse Lives with:  Facility Resident Do you feel safe going back to the place where you live?  Yes Need for family participation in patient care:  Yes (Comment)  Care giving concerns:  Patient and family do not have any concerns regarding care giving   Social Worker assessment / plan:  [facility] who was receiving iv antibiotics while she was there.  Patient plans to return back to facility.  Patient is alert and oriented x2, and appropriate.  Patient's husband is the main contact due to patient having some confusion at times.  Patient and husband are aware of [facility].  Patient and her family did not express any other questions.  Employment status:  Retired Database administratornsurance information:  Managed Medicare PT Recommendations:  Not assessed at this time Information / Referral to community resources:     Patient/Family's Response to care:  Patient and husband agreeable to going to SNF for short term rehab.  Patient/Family's Understanding of and Emotional Response to Diagnosis, Current Treatment, and Prognosis:  Patient and  husband aware of current diagnosis and treatment plan.  Emotional Assessment Appearance:  Appears stated age Attitude/Demeanor/Rapport:    Affect (typically observed):  Appropriate, Calm Orientation:  Oriented to Self, Oriented to Place Alcohol / Substance use:  Not Applicable Psych involvement (Current and /or in the community):  No (Comment)  Discharge Needs  Concerns to be addressed:  No discharge needs identified Readmission within the last 30 days:  Yes ([facility]) Current discharge risk:  None Barriers to Discharge:  Continued Medical Work up   [place] [employer], [name], LCSWA 10/17/2015, 5:38 PM

## 2015-10-17 NOTE — Progress Notes (Signed)
[facility] PROGRESS NOTE  Lindsay SkillernHelen M Cobb WUJ:[id number] DOB: 05/19/1941 DOA: 10/14/2015 PCP: Milinda AntisURHAM, KAWANTA, MD  Assessment/Plan:  Principal Problem:   Decubitus ulcer of left buttock, unstageable (HCC): s/p surgical debridement. Now getting pulse lavage. cx grew e coli sensitive to rocephin. Continue same. Appreciate surgery and ID  Active Problems: Possible UTI: Urine cx with 80K E coli colonies . History of MRSA bacteremia and septic knee, status post left AKA: no further need for daptomycin per ID. Dr. Lajoyce Cornersuda evaluated and removed staples and sutures    Type II diabetes mellitus (HCC): Metformin held due to renal failure. Continue sliding scale insulin. Reasonable control.    Essential hypertension, benign: Normotensive off medications    Obesity    Anxiety: stable    Anemia of chronic disease: Had anemia panel last admission. Hemoglobin has dropped with hydration, may be dilution. No overt bleed    Hyponatremia; mild. improving    Constipation: continue lactulose  dermatitis: improving  Status post recent left AKA on 10/23. See above  Confusion/encephalopathy: This was an issue during last hospitalization. improved  Urinary retention: continue foley while getting woundcare. Eventual voiding trial  Acute kidney injury: Likely from infection, consider component retention contributing. improving  Code Status:  full Family Communication:   husband Disposition Plan:  back to skilled nursing facility when wound stable/off pulse lavage  Consultants:   surgery  Infectious disease   Procedures:     Antibiotics:   ceftriaxone, flagyl  HPI/Subjective:  no complaints. Having stools. Pain controlled  Objective: Filed Vitals:   10/16/15 2059  BP: 116/46  Pulse: 82  Temp: 98.2 F (36.8 C)  Resp: 17    Intake/Output Summary (Last 24 hours) at 10/17/15 1010 Last data filed at 10/17/15 0918  Gross per 24 hour  Intake 1850.66 ml  Output   1000 ml   Net 850.66 ml   Filed Weights   10/14/15 2244 10/16/15 2059  Weight: 93.35 kg (205 lb 12.8 oz) 94.167 kg (207 lb 9.6 oz)    Exam:   General:   alert. appropriate  Cardiovascular:  regular rate rhythm without murmurs gallops rubs   Respiratory: Clear to auscultation bilaterally without wheezes rhonchi or rales   Abdomen:  soft nontender nondistended   Ext:  no clubbing cyanosis or edema. Left AKA stump dressing clean dry and intact  Neurologic: Nonfocal  Skin: Less desquamation and dryness on both arms. Buttock wound with dressing over top.   Basic Metabolic Panel:  Recent Labs Lab 10/13/15 1500 10/14/15 1735 10/15/15 0420 10/16/15 0510 10/17/15 0340  NA 129* 127* 130* 132* 134*  K 5.0 4.2 4.4 3.6 3.4*  CL 95* 92* 96* 98* 100*  CO2 25 25 24 26 26   GLUCOSE 157* 131* 191* 126* 112*  BUN 16 14 17  23* 20  CREATININE 1.38* 1.52* 1.80* 1.99* 1.61*  CALCIUM 8.2* 8.3* 7.9* 7.9* 8.0*   Liver Function Tests:  Recent Labs Lab 10/13/15 1500 10/14/15 1735 10/15/15 0420  AST 20 19 14*  ALT 14 13* 12*  ALKPHOS 89 93 84  BILITOT 0.5 0.6 1.0  PROT 6.7 6.7 5.8*  ALBUMIN 2.1* 2.0* 1.6*   No results for input(s): LIPASE, AMYLASE in the last 168 hours. No results for input(s): AMMONIA in the last 168 hours. CBC:  Recent Labs Lab 10/13/15 1500 10/14/15 1735 10/15/15 0420 10/16/15 0510 10/17/15 0340  WBC 14.2* 12.6* 13.0* 15.3* 11.8*  NEUTROABS 9.7* 8.3*  --  10.1*  --   HGB 7.9* 8.2*  7.0* 7.7* 7.3*  HCT 24.9* 26.0* 22.6* 24.0* 23.5*  MCV 78.8 76.9* 77.1* 77.7* 77.0*  PLT 451* 454* 390 452* 478*   Cardiac Enzymes:  Recent Labs Lab 10/13/15 1500  CKTOTAL 25*  CKMB 1.6   BNP (last 3 results)  Recent Labs  09/26/15 1414  BNP 48.0    ProBNP (last 3 results) No results for input(s): PROBNP in the last 8760 hours.  CBG:  Recent Labs Lab 10/16/15 0739 10/16/15 1202 10/16/15 1702 10/16/15 2317 10/17/15 0756  GLUCAP 115* 142* 138* 120* 120*     Recent Results (from the past 240 hour(s))  Wound culture     Status: None   Collection Time: 10/13/15  4:10 PM  Result Value Ref Range Status   Specimen Description WOUND LEFT BUTTOCK  Final   Special Requests NONE  Final   Gram Stain   Final    MODERATE WBC PRESENT,BOTH PMN AND MONONUCLEAR NO SQUAMOUS EPITHELIAL CELLS SEEN FEW GRAM NEGATIVE RODS Performed at [facility]    Culture   Final    ABUNDANT ESCHERICHIA COLI Performed at [facility]    Report Status 10/16/2015 FINAL  Final   Organism ID, Bacteria ESCHERICHIA COLI  Final      Susceptibility   Escherichia coli - MIC*    AMPICILLIN >=32 RESISTANT Resistant     AMPICILLIN/SULBACTAM <=2 SENSITIVE Sensitive     CEFAZOLIN 16 RESISTANT Resistant     CEFEPIME <=1 SENSITIVE Sensitive     CEFTAZIDIME <=1 SENSITIVE Sensitive     CEFTRIAXONE <=1 SENSITIVE Sensitive     CIPROFLOXACIN >=4 RESISTANT Resistant     GENTAMICIN <=1 SENSITIVE Sensitive     IMIPENEM <=0.25 SENSITIVE Sensitive     PIP/TAZO <=4 SENSITIVE Sensitive     TOBRAMYCIN <=1 SENSITIVE Sensitive     TRIMETH/SULFA Value in next row Sensitive      <=20 SENSITIVE(NOTE)    * ABUNDANT ESCHERICHIA COLI  Blood Culture (routine x 2)     Status: None (Preliminary result)   Collection Time: 10/14/15  5:20 PM  Result Value Ref Range Status   Specimen Description BLOOD RIGHT ARM  Final   Special Requests IN PEDIATRIC BOTTLE 4CC  Final   Culture NO GROWTH 2 DAYS  Final   Report Status PENDING  Incomplete  Blood Culture (routine x 2)     Status: None (Preliminary result)   Collection Time: 10/14/15  5:30 PM  Result Value Ref Range Status   Specimen Description BLOOD RIGHT HAND  Final   Special Requests BOTTLES DRAWN AEROBIC AND ANAEROBIC 2CC  Final   Culture NO GROWTH 2 DAYS  Final   Report Status PENDING  Incomplete  Urine culture     Status: None   Collection Time: 10/14/15  8:07 PM  Result Value Ref Range Status   Specimen Description URINE,  CLEAN CATCH  Final   Special Requests NONE  Final   Culture 80,000 COLONIES/ml ESCHERICHIA COLI  Final   Report Status 10/16/2015 FINAL  Final   Organism ID, Bacteria ESCHERICHIA COLI  Final      Susceptibility   Escherichia coli - MIC*    AMPICILLIN 4 SENSITIVE Sensitive     CEFAZOLIN <=4 SENSITIVE Sensitive     CEFTRIAXONE <=1 SENSITIVE Sensitive     CIPROFLOXACIN >=4 RESISTANT Resistant     GENTAMICIN <=1 SENSITIVE Sensitive     IMIPENEM <=0.25 SENSITIVE Sensitive     NITROFURANTOIN <=16 SENSITIVE Sensitive     TRIMETH/SULFA <=  20 SENSITIVE Sensitive     AMPICILLIN/SULBACTAM <=2 SENSITIVE Sensitive     PIP/TAZO <=4 SENSITIVE Sensitive     * 80,000 COLONIES/ml ESCHERICHIA COLI     Studies: No results found.  Scheduled Meds: . cefTRIAXone (ROCEPHIN)  IV  1 g Intravenous Q24H  . collagenase   Topical Daily  . ferrous sulfate  325 mg Oral Q breakfast  . heparin  5,000 Units Subcutaneous 3 times per day  . hydrocerin   Topical BID  . imipramine  50 mg Oral TID  . insulin aspart  0-9 Units Subcutaneous TID WC  . lactulose  30 g Oral Daily  . loratadine  10 mg Oral Daily  . metronidazole  500 mg Intravenous Q8H  . sodium chloride  3 mL Intravenous Q12H   Continuous Infusions: . sodium chloride 1,000 mL (10/15/15 0955)    Time spent: 35 minutes  Mykayla Brinton L  [facility] [email or web address], password [facility] 10/17/2015, 10:10 AM  LOS: 3 days

## 2015-10-18 LAB — GLUCOSE, CAPILLARY
Glucose-Capillary: 95 mg/dL (ref 65–99)
Glucose-Capillary: 121 mg/dL — ABNORMAL HIGH (ref 65–99)
Glucose-Capillary: 87 mg/dL (ref 65–99)
Glucose-Capillary: 115 mg/dL — ABNORMAL HIGH (ref 65–99)

## 2015-10-18 NOTE — Progress Notes (Signed)
Physical Therapy Wound Treatment Patient Details  Name: Lindsay Cobb MRN: [id number] Date of Birth: October 22, 1941  Today's Date: 10/18/2015 Time: 1127-1202 Time Calculation (min): 35 min  Subjective  Subjective: I'm doing pretty good Patient and Family Stated Goals: Heal wound Date of Onset:  (admitted with pressure ulcer) Prior Treatments: I&D on 11/09  Pain Score:   pre-medicated, reports occasional pain during hydrotherapy session  Wound Assessment  Pressure Ulcer Unstageable - Full thickness tissue loss in which the base of the ulcer is covered by slough (yellow, tan, gray, green or brown) and/or eschar (tan, brown or black) in the wound bed. (Active)  Dressing Type Gauze (Comment);Moist to dry;Tape dressing;Other (Comment);ABD 10/18/2015 12:48 PM  Dressing Changed;Clean;Dry;Intact 10/18/2015 12:48 PM  Dressing Change Frequency Twice a day 10/18/2015 12:48 PM  State of Healing Eschar 10/18/2015 12:48 PM  Site / Wound Assessment Painful;Pink;Red;Yellow 10/18/2015 12:48 PM  % Wound base Red or Granulating 30% 10/18/2015 12:48 PM  % Wound base Yellow 70% 10/18/2015 12:48 PM  % Wound base Black 20% 10/14/2015 11:23 PM  Peri-wound Assessment Intact;Induration 10/18/2015 12:48 PM  Wound Length (cm) 7.2 cm 10/16/2015 12:40 PM  Wound Width (cm) 4.1 cm 10/16/2015 12:40 PM  Wound Depth (cm) 3 cm 10/16/2015 12:40 PM  Tunneling (cm) 0 09/26/2015 10:53 AM  Undermining (cm) 3.1cm from 9:00 to 3:00 10/16/2015 12:40 PM  Margins Unattached edges (unapproximated) 10/18/2015 12:48 PM  Drainage Amount Copious 10/18/2015 12:48 PM  Drainage Description Serosanguineous;Odor;Purulent 10/18/2015 12:48 PM  Treatment Debridement (Selective);Hydrotherapy (Pulse lavage);Off loading;Packing (Saline gauze);Tape changed;Other (Comment) 10/18/2015 12:48 PM     Santyl applied to wound bed prior to applying dressing.  Hydrotherapy Pulsed lavage therapy - wound location: Left buttock Pulsed Lavage with  Suction (psi): 8 psi (4-12psi) Pulsed Lavage with Suction - Normal Saline Used: 1000 mL Pulsed Lavage Tip: Tip with splash shield Selective Debridement Selective Debridement - Location: Left buttock Selective Debridement - Tools Used: Forceps;Scissors Selective Debridement - Tissue Removed: Eschar and slough   Wound Assessment and Plan  Wound Therapy - Assess/Plan/Recommendations Wound Therapy - Clinical Statement: Still with a lot of purulence. Revealing deeper layers of nectrotic tissue in center and through undermining side of wound. Will continue to debride selectively and keep an eye on where this leads as there is some induration. Wound Therapy - Functional Problem List: unable to tolerate prolonged sitting due to pain and risk of progression of decubitus ulcer. Factors Delaying/Impairing Wound Healing: Diabetes Mellitus;Immobility;Multiple medical problems;Polypharmacy Hydrotherapy Plan: Debridement;Dressing change;Patient/family education;Pulsatile lavage with suction Wound Therapy - Frequency: 6X / week Wound Therapy - Current Recommendations: PT Wound Therapy - Follow Up Recommendations:  (per surgery) Wound Plan: see above  Wound Therapy Goals- Improve the function of patient's integumentary system by progressing the wound(s) through the phases of wound healing (inflammation - proliferation - remodeling) by: Decrease Necrotic Tissue to: 50 Decrease Necrotic Tissue - Progress: Progressing toward goal Increase Granulation Tissue to: 50 Increase Granulation Tissue - Progress: Progressing toward goal Improve Drainage Characteristics: Min Improve Drainage Characteristics - Progress: Not progressing  Goals will be updated until maximal potential achieved or discharge criteria met.  Discharge criteria: when goals achieved, discharge from hospital, MD decision/surgical intervention, no progress towards goals, refusal/missing three consecutive treatments without notification or medical  reason.  GP     Candie Mile S 10/18/2015, 12:53 PM Camille Bal [place], [place]

## 2015-10-18 NOTE — Progress Notes (Signed)
[facility] PROGRESS NOTE  Lindsay Cobb UJW:[id number] DOB: [birth year] DOA: 10/14/2015 PCP: [name], MD  Assessment/Plan:  Principal Problem:   Decubitus ulcer of left buttock, stage 4 [facility]): s/p surgical debridement. Now getting pulse lavage. cx grew e coli sensitive to rocephin. Continue same. Appreciate surgery and ID.  Active Problems: Possible UTI: Urine cx with 80K E coli colonies . History of MRSA bacteremia and septic knee, status post left AKA: no further need for daptomycin per ID. Dr. [name] evaluated and removed staples and sutures    Type II diabetes mellitus (HCC): Metformin held due to renal failure. Continue sliding scale insulin. Reasonable control.    Essential hypertension, benign: Normotensive off medications    Obesity    Anxiety: stable    Anemia of chronic disease: Had anemia panel last admission. Hemoglobin has dropped with hydration, may be dilution. No overt bleed    Hyponatremia; mild. improving    Constipation: continue lactulose  dermatitis: improving  Status post recent left AKA on 10/23. See above  Confusion/encephalopathy: This was an issue during last hospitalization. improved  Urinary retention: continue foley while getting woundcare. Eventual voiding trial  Acute kidney injury: Likely from infection, consider component retention contributing. improving  Code Status:  full Family Communication:   husband Disposition Plan:  back to skilled nursing facility when wound stable/off pulse lavage  Consultants:   surgery  Infectious disease   Procedures:     Antibiotics:   ceftriaxone, flagyl  HPI/Subjective:  no complaints. Having stools. Pain controlled  Objective: Filed Vitals:   10/18/15 0608  BP: 133/58  Pulse: 89  Temp: 98.2 F (36.8 C)  Resp: 17    Intake/Output Summary (Last 24 hours) at 10/18/15 1132 Last data filed at 10/18/15 5621  Gross per 24 hour  Intake 881.67 ml  Output    850 ml  Net   31.67 ml   Filed Weights   10/14/15 2244 10/16/15 2059  Weight: 93.35 kg (205 lb 12.8 oz) 94.167 kg (207 lb 9.6 oz)    Exam:   General:   alert. appropriate  Cardiovascular:  regular rate rhythm without murmurs gallops rubs   Respiratory: Clear to auscultation bilaterally without wheezes rhonchi or rales   Abdomen:  soft nontender nondistended   Ext:  no clubbing cyanosis or edema. Left AKA stump dressing clean dry and intact  Neurologic: Nonfocal  Skin: Less desquamation and dryness on both arms. Buttock wound with grey/white slough.  Basic Metabolic Panel:  Recent Labs Lab 10/13/15 1500 10/14/15 1735 10/15/15 0420 10/16/15 0510 10/17/15 0340  NA 129* 127* 130* 132* 134*  K 5.0 4.2 4.4 3.6 3.4*  CL 95* 92* 96* 98* 100*  CO2 GLUCOSE 157* 131* 191* 126* 112*  BUN 23* 20  CREATININE 1.38* 1.52* 1.80* 1.99* 1.61*  CALCIUM 8.2* 8.3* 7.9* 7.9* 8.0*   Liver Function Tests:  Recent Labs Lab 10/13/15 1500 10/14/15 1735 10/15/15 0420  AST 20 19 14*  ALT 14 13* 12*  ALKPHOS 89 93 84  BILITOT 0.5 0.6 1.0  PROT 6.7 6.7 5.8*  ALBUMIN 2.1* 2.0* 1.6*   No results for input(s): LIPASE, AMYLASE in the last 168 hours. No results for input(s): AMMONIA in the last 168 hours. CBC:  Recent Labs Lab 10/13/15 1500 10/14/15 1735 10/15/15 0420 10/16/15 0510 10/17/15 0340  WBC 14.2* 12.6* 13.0* 15.3* 11.8*  NEUTROABS 9.7* 8.3*  --  10.1*  --   HGB 7.9*  8.2* 7.0* 7.7* 7.3*  HCT 24.9* 26.0* 22.6* 24.0* 23.5*  MCV 78.8 76.9* 77.1* 77.7* 77.0*  PLT 451* 454* 390 452* 478*   Cardiac Enzymes:  Recent Labs Lab 10/13/15 1500  CKTOTAL 25*  CKMB 1.6   BNP (last 3 results)  Recent Labs  09/26/15 1414  BNP 48.0    ProBNP (last 3 results) No results for input(s): PROBNP in the last 8760 hours.  CBG:  Recent Labs Lab 10/17/15 0756 10/17/15 1214 10/17/15 1710 10/17/15 2155 10/18/15 0812  GLUCAP 120* 142* 114* 97 95    Recent  Results (from the past 240 hour(s))  Wound culture     Status: None   Collection Time: 10/13/15  4:10 PM  Result Value Ref Range Status   Specimen Description WOUND LEFT BUTTOCK  Final   Special Requests NONE  Final   Gram Stain   Final    MODERATE WBC PRESENT,BOTH PMN AND MONONUCLEAR NO SQUAMOUS EPITHELIAL CELLS SEEN FEW GRAM NEGATIVE RODS [facility]    Culture   Final    ABUNDANT ESCHERICHIA COLI Performed at [facility]    Report Status 10/16/2015 FINAL  Final   Organism ID, Bacteria ESCHERICHIA COLI  Final      Susceptibility   Escherichia coli - MIC*    AMPICILLIN >=32 RESISTANT Resistant     AMPICILLIN/SULBACTAM <=2 SENSITIVE Sensitive     CEFAZOLIN 16 RESISTANT Resistant     CEFEPIME <=1 SENSITIVE Sensitive     CEFTAZIDIME <=1 SENSITIVE Sensitive     CEFTRIAXONE <=1 SENSITIVE Sensitive     CIPROFLOXACIN >=4 RESISTANT Resistant     GENTAMICIN <=1 SENSITIVE Sensitive     IMIPENEM <=0.25 SENSITIVE Sensitive     PIP/TAZO <=4 SENSITIVE Sensitive     TOBRAMYCIN <=1 SENSITIVE Sensitive     TRIMETH/SULFA Value in next row Sensitive      <=20 SENSITIVE(NOTE)    * ABUNDANT ESCHERICHIA COLI  Blood Culture (routine x 2)     Status: None (Preliminary result)   Collection Time: 10/14/15  5:20 PM  Result Value Ref Range Status   Specimen Description BLOOD RIGHT ARM  Final   Special Requests IN PEDIATRIC BOTTLE 4CC  Final   Culture NO GROWTH 4 DAYS  Final   Report Status PENDING  Incomplete  Blood Culture (routine x 2)     Status: None (Preliminary result)   Collection Time: 10/14/15  5:30 PM  Result Value Ref Range Status   Specimen Description BLOOD RIGHT HAND  Final   Special Requests BOTTLES DRAWN AEROBIC AND ANAEROBIC 2CC  Final   Culture NO GROWTH 4 DAYS  Final   Report Status PENDING  Incomplete  Urine culture     Status: None   Collection Time: 10/14/15  8:07 PM  Result Value Ref Range Status   Specimen Description URINE, CLEAN CATCH   Final   Special Requests NONE  Final   Culture 80,000 COLONIES/ml ESCHERICHIA COLI  Final   Report Status 10/16/2015 FINAL  Final   Organism ID, Bacteria ESCHERICHIA COLI  Final      Susceptibility   Escherichia coli - MIC*    AMPICILLIN 4 SENSITIVE Sensitive     CEFAZOLIN <=4 SENSITIVE Sensitive     CEFTRIAXONE <=1 SENSITIVE Sensitive     CIPROFLOXACIN >=4 RESISTANT Resistant     GENTAMICIN <=1 SENSITIVE Sensitive     IMIPENEM <=0.25 SENSITIVE Sensitive     NITROFURANTOIN <=16 SENSITIVE Sensitive  TRIMETH/SULFA <=20 SENSITIVE Sensitive     AMPICILLIN/SULBACTAM <=2 SENSITIVE Sensitive     PIP/TAZO <=4 SENSITIVE Sensitive     * 80,000 COLONIES/ml ESCHERICHIA COLI     Studies: No results found.  Scheduled Meds: . cefTRIAXone (ROCEPHIN)  IV  1 g Intravenous Q24H  . collagenase   Topical Daily  . ferrous sulfate  325 mg Oral Q breakfast  . heparin  5,000 Units Subcutaneous 3 times per day  . hydrocerin   Topical BID  . imipramine  50 mg Oral TID  . insulin aspart  0-9 Units Subcutaneous TID WC  . [START ON 10/19/2015] lactulose  30 g Oral QODAY  . loratadine  10 mg Oral Daily  . metronidazole  500 mg Intravenous Q8H  . sodium chloride  3 mL Intravenous Q12H   Continuous Infusions: . sodium chloride 1,000 mL (10/18/15 0204)    Time spent: 35 minutes  Janari Yamada L  [facility] [email or web address], password [facility] 10/18/2015, 11:32 AM  LOS: 4 days

## 2015-10-19 LAB — BASIC METABOLIC PANEL
Anion gap: 6 (ref 5–15)
BUN: 8 mg/dL (ref 6–20)
CALCIUM: 8.4 mg/dL — AB (ref 8.9–10.3)
CHLORIDE: 103 mmol/L (ref 101–111)
CO2: 26 mmol/L (ref 22–32)
CREATININE: 1.13 mg/dL — AB (ref 0.44–1.00)
GFR calc Af Amer: 54 mL/min — ABNORMAL LOW (ref >60)
GFR calc non Af Amer: 47 mL/min — ABNORMAL LOW (ref >60)
GLUCOSE: 120 mg/dL — AB (ref 65–99)
Potassium: 3.8 mmol/L (ref 3.5–5.1)
Sodium: 135 mmol/L (ref 135–145)

## 2015-10-19 LAB — CULTURE, BLOOD (ROUTINE X 2)
CULTURE: NO GROWTH
Culture: NO GROWTH

## 2015-10-19 LAB — GLUCOSE, CAPILLARY
GLUCOSE-CAPILLARY: 109 mg/dL — AB (ref 65–99)
Glucose-Capillary: 105 mg/dL — ABNORMAL HIGH (ref 65–99)
Glucose-Capillary: 114 mg/dL — ABNORMAL HIGH (ref 65–99)
Glucose-Capillary: 95 mg/dL (ref 65–99)

## 2015-10-19 LAB — CBC
HEMATOCRIT: 24.8 % — AB (ref 36.0–46.0)
Hemoglobin: 7.6 g/dL — ABNORMAL LOW (ref 12.0–15.0)
MCH: 24.1 pg — AB (ref 26.0–34.0)
MCHC: 30.6 g/dL (ref 30.0–36.0)
MCV: 78.5 fL (ref 78.0–100.0)
Platelets: 472 10*3/uL — ABNORMAL HIGH (ref 150–400)
RBC: 3.16 MIL/uL — ABNORMAL LOW (ref 3.87–5.11)
RDW: 19.5 % — AB (ref 11.5–15.5)
WBC: 9 10*3/uL (ref 4.0–10.5)

## 2015-10-19 NOTE — Progress Notes (Signed)
[facility] PROGRESS NOTE  Lindsay Cobb ZOX:[id number] DOB: Dec 14, 1940 DOA: 10/14/2015 PCP: [name], MD  Assessment/Plan:  Principal Problem:   Decubitus ulcer of left buttock, stage 4 [facility]): s/p surgical debridement. continue pulse lavage. cx grew e coli sensitive to rocephin. Continue same. Appreciate surgery and ID. Back to SNF when wound debrided enough. PT consult for OOB  Active Problems: Possible UTI: Urine cx with 80K E coli colonies . History of MRSA bacteremia and septic knee, status post left AKA: no further need for daptomycin per ID. Dr. [name] evaluated and removed staples and sutures    Type II diabetes mellitus (HCC): Metformin held due to renal failure. Continue sliding scale insulin. Reasonable control.    Essential hypertension, benign: Normotensive off medications    Obesity    Anxiety: stable    Anemia of chronic disease: Had anemia panel last admission. Hemoglobin has dropped with hydration, may be dilution. No overt bleed    Hyponatremia; mild. improving    Constipation: continue lactulose  dermatitis: improving  Status post recent left AKA on 10/23. See above  Confusion/encephalopathy: This was an issue during last hospitalization. improved  Urinary retention: continue foley while getting woundcare. Eventual voiding trial  Acute kidney injury: Likely from infection, consider component retention contributing. improving  Code Status:  full Family Communication:   husband Disposition Plan:  back to skilled nursing facility when wound stable/off pulse lavage  Consultants:   surgery  Infectious disease   Procedures:     Antibiotics:   ceftriaxone, flagyl  HPI/Subjective: No complaints. Wants to get OOB  Objective: Filed Vitals:   10/19/15 1348  BP: 138/77  Pulse: 92  Temp: 98.8 F (37.1 C)  Resp: 18    Intake/Output Summary (Last 24 hours) at 10/19/15 1400 Last data filed at 10/19/15 0929  Gross per 24 hour   Intake    763 ml  Output   1070 ml  Net   -307 ml   Filed Weights   10/14/15 2244 10/16/15 2059  Weight: 93.35 kg (205 lb 12.8 oz) 94.167 kg (207 lb 9.6 oz)    Exam:   General:   alert. appropriate  Cardiovascular:  regular rate rhythm without murmurs gallops rubs   Respiratory: Clear to auscultation bilaterally without wheezes rhonchi or rales   Abdomen:  soft nontender nondistended   Ext:  no clubbing cyanosis or edema. Left AKA stump dressing clean dry and intact  Neurologic: Nonfocal  Skin: dressing CDI  Basic Metabolic Panel:  Recent Labs Lab 10/14/15 1735 10/15/15 0420 10/16/15 0510 10/17/15 0340 10/19/15 0446  NA 127* 130* 132* 134* 135  K 4.2 4.4 3.6 3.4* 3.8  CL 92* 96* 98* 100* 103  CO2 GLUCOSE 131* 191* 126* 112* 120*  BUN 14 17 23* 20 8  CREATININE 1.52* 1.80* 1.99* 1.61* 1.13*  CALCIUM 8.3* 7.9* 7.9* 8.0* 8.4*   Liver Function Tests:  Recent Labs Lab 10/13/15 1500 10/14/15 1735 10/15/15 0420  AST 20 19 14*  ALT 14 13* 12*  ALKPHOS 89 93 84  BILITOT 0.5 0.6 1.0  PROT 6.7 6.7 5.8*  ALBUMIN 2.1* 2.0* 1.6*   No results for input(s): LIPASE, AMYLASE in the last 168 hours. No results for input(s): AMMONIA in the last 168 hours. CBC:  Recent Labs Lab 10/13/15 1500 10/14/15 1735 10/15/15 0420 10/16/15 0510 10/17/15 0340 10/19/15 0446  WBC 14.2* 12.6* 13.0* 15.3* 11.8* 9.0  NEUTROABS 9.7* 8.3*  --  10.1*  --   --  HGB 7.9* 8.2* 7.0* 7.7* 7.3* 7.6*  HCT 24.9* 26.0* 22.6* 24.0* 23.5* 24.8*  MCV 78.8 76.9* 77.1* 77.7* 77.0* 78.5  PLT 451* 454* 390 452* 478* 472*   Cardiac Enzymes:  Recent Labs Lab 10/13/15 1500  CKTOTAL 25*  CKMB 1.6   BNP (last 3 results)  Recent Labs  09/26/15 1414  BNP 48.0    ProBNP (last 3 results) No results for input(s): PROBNP in the last 8760 hours.  CBG:  Recent Labs Lab 10/18/15 1316 10/18/15 1702 10/18/15 2135 10/19/15 0803 10/19/15 1145  GLUCAP 121* 87 115* 105*  114*    Recent Results (from the past 240 hour(s))  Wound culture     Status: None   Collection Time: 10/13/15  4:10 PM  Result Value Ref Range Status   Specimen Description WOUND LEFT BUTTOCK  Final   Special Requests NONE  Final   Gram Stain   Final    MODERATE WBC PRESENT,BOTH PMN AND MONONUCLEAR NO SQUAMOUS EPITHELIAL CELLS SEEN FEW GRAM NEGATIVE RODS Performed at [facility]    Culture   Final    ABUNDANT ESCHERICHIA COLI Performed at [facility]    Report Status 10/16/2015 FINAL  Final   Organism ID, Bacteria ESCHERICHIA COLI  Final      Susceptibility   Escherichia coli - MIC*    AMPICILLIN >=32 RESISTANT Resistant     AMPICILLIN/SULBACTAM <=2 SENSITIVE Sensitive     CEFAZOLIN 16 RESISTANT Resistant     CEFEPIME <=1 SENSITIVE Sensitive     CEFTAZIDIME <=1 SENSITIVE Sensitive     CEFTRIAXONE <=1 SENSITIVE Sensitive     CIPROFLOXACIN >=4 RESISTANT Resistant     GENTAMICIN <=1 SENSITIVE Sensitive     IMIPENEM <=0.25 SENSITIVE Sensitive     PIP/TAZO <=4 SENSITIVE Sensitive     TOBRAMYCIN <=1 SENSITIVE Sensitive     TRIMETH/SULFA Value in next row Sensitive      <=20 SENSITIVE(NOTE)    * ABUNDANT ESCHERICHIA COLI  Blood Culture (routine x 2)     Status: None   Collection Time: 10/14/15  5:20 PM  Result Value Ref Range Status   Specimen Description BLOOD RIGHT ARM  Final   Special Requests IN PEDIATRIC BOTTLE 4CC  Final   Culture NO GROWTH 5 DAYS  Final   Report Status 10/19/2015 FINAL  Final  Blood Culture (routine x 2)     Status: None   Collection Time: 10/14/15  5:30 PM  Result Value Ref Range Status   Specimen Description BLOOD RIGHT HAND  Final   Special Requests BOTTLES DRAWN AEROBIC AND ANAEROBIC 2CC  Final   Culture NO GROWTH 5 DAYS  Final   Report Status 10/19/2015 FINAL  Final  Urine culture     Status: None   Collection Time: 10/14/15  8:07 PM  Result Value Ref Range Status   Specimen Description URINE, CLEAN CATCH  Final   Special  Requests NONE  Final   Culture 80,000 COLONIES/ml ESCHERICHIA COLI  Final   Report Status 10/16/2015 FINAL  Final   Organism ID, Bacteria ESCHERICHIA COLI  Final      Susceptibility   Escherichia coli - MIC*    AMPICILLIN 4 SENSITIVE Sensitive     CEFAZOLIN <=4 SENSITIVE Sensitive     CEFTRIAXONE <=1 SENSITIVE Sensitive     CIPROFLOXACIN >=4 RESISTANT Resistant     GENTAMICIN <=1 SENSITIVE Sensitive     IMIPENEM <=0.25 SENSITIVE Sensitive     NITROFURANTOIN <=16 SENSITIVE Sensitive  TRIMETH/SULFA <=20 SENSITIVE Sensitive     AMPICILLIN/SULBACTAM <=2 SENSITIVE Sensitive     PIP/TAZO <=4 SENSITIVE Sensitive     * 80,000 COLONIES/ml ESCHERICHIA COLI     Studies: No results found.  Scheduled Meds: . cefTRIAXone (ROCEPHIN)  IV  1 g Intravenous Q24H  . collagenase   Topical Daily  . ferrous sulfate  325 mg Oral Q breakfast  . heparin  5,000 Units Subcutaneous 3 times per day  . hydrocerin   Topical BID  . imipramine  50 mg Oral TID  . insulin aspart  0-9 Units Subcutaneous TID WC  . lactulose  30 g Oral QODAY  . loratadine  10 mg Oral Daily  . metronidazole  500 mg Intravenous Q8H  . sodium chloride  3 mL Intravenous Q12H   Continuous Infusions: . sodium chloride 1,000 mL (10/18/15 0204)    Time spent: 35 minutes  Mykaila Blunck L  [facility] [email or web address], password [email or web address] 10/19/2015, 2:00 PM  LOS: 5 days

## 2015-10-20 DIAGNOSIS — B9620 Unspecified Escherichia coli [E. coli] as the cause of diseases classified elsewhere: Secondary | ICD-10-CM

## 2015-10-20 DIAGNOSIS — L89329 Pressure ulcer of left buttock, unspecified stage: Secondary | ICD-10-CM

## 2015-10-20 DIAGNOSIS — Z8619 Personal history of other infectious and parasitic diseases: Secondary | ICD-10-CM

## 2015-10-20 LAB — GLUCOSE, CAPILLARY
GLUCOSE-CAPILLARY: 104 mg/dL — AB (ref 65–99)
GLUCOSE-CAPILLARY: 124 mg/dL — AB (ref 65–99)
Glucose-Capillary: 108 mg/dL — ABNORMAL HIGH (ref 65–99)
Glucose-Capillary: 95 mg/dL (ref 65–99)

## 2015-10-20 MED ORDER — ATENOLOL 25 MG PO TABS
25.0000 mg | ORAL_TABLET | Freq: Every day | ORAL | Status: DC
  Administered 2015-10-20 – 2015-10-21 (×2): 25 mg via ORAL
  Filled 2015-10-20 (×2): qty 1

## 2015-10-20 NOTE — Progress Notes (Signed)
Patient ID: Lindsay Cobb, female   DOB: Feb 25, 1941, 74 y.o.   MRN: [id number]     [facility]      [address] [place], [place] [address]    Phone: [phone] FAX: [phone]     Subjective: Antibiotics changed per ID.   Objective:  Vital signs:  Filed Vitals:   10/19/15 0416 10/19/15 1348 10/19/15 2211 10/20/15 0433  BP: 132/72 138/77 157/66 151/74  Pulse: 93 92 87 91  Temp: 98.4 F (36.9 C) 98.8 F (37.1 C) 98.2 F (36.8 C) 98.1 F (36.7 C)  TempSrc: Oral Oral Oral   Resp: _0 Height:      Weight:      SpO2: 97% 98% 96% 97%    Last BM Date: 10/18/15  Intake/Output   Yesterday:  11/13 0701 - 11/14 0700 In: 763 [P.O.:360; I.V.:3; IV Piggyback:400] Out: 1500 [Urine:1500] This shift: I/O last 3 completed shifts: In: 1406 [P.O.:600; I.V.:6; IV Piggyback:800] Out: 1970 [IBBCW:8889]    Physical Exam: General: Pt awake/alert/oriented x4 in no acute distress  Skin: right lateral buttock-wound is open, tracks medially, fibrinous tissue throughout the base.    Problem List:   Principal Problem:   Decubitus ulcer of left buttock, unstageable (HCC) Active Problems:   Type II diabetes mellitus ([place])   Essential hypertension, benign   Obesity   Anxiety   Anemia of chronic disease   Hyponatremia   Constipation   Leukocytosis   S/P AKA (above knee amputation) unilateral (HCC)    Results:   Labs: Results for orders placed or performed during the hospital encounter of 10/14/15 (from the past 48 hour(s))  Glucose, capillary     Status: Abnormal   Collection Time: 10/18/15  1:16 PM  Result Value Ref Range   Glucose-Capillary 121 (H) 65 - 99 mg/dL  Glucose, capillary     Status: None   Collection Time: 10/18/15  5:02 PM  Result Value Ref Range   Glucose-Capillary 87 65 - 99 mg/dL  Glucose, capillary     Status: Abnormal   Collection Time: 10/18/15  9:35 PM  Result Value Ref Range    Glucose-Capillary 115 (H) 65 - 99 mg/dL   Comment 1 Notify RN   CBC     Status: Abnormal   Collection Time: 10/19/15  4:46 AM  Result Value Ref Range   WBC 9.0 4.0 - 10.5 K/uL   RBC 3.16 (L) 3.87 - 5.11 MIL/uL   Hemoglobin 7.6 (L) 12.0 - 15.0 g/dL   HCT 24.8 (L) 36.0 - 46.0 %   MCV 78.5 78.0 - 100.0 fL   MCH 24.1 (L) 26.0 - 34.0 pg   MCHC 30.6 30.0 - 36.0 g/dL   RDW 19.5 (H) 11.5 - 15.5 %   Platelets 472 (H) 150 - 400 K/uL  Basic metabolic panel     Status: Abnormal   Collection Time: 10/19/15  4:46 AM  Result Value Ref Range   Sodium 135 135 - 145 mmol/L   Potassium 3.8 3.5 - 5.1 mmol/L   Chloride 103 101 - 111 mmol/L   CO2 26 22 - 32 mmol/L   Glucose, Bld 120 (H) 65 - 99 mg/dL   BUN 8 6 - 20 mg/dL   Creatinine, Ser 1.13 (H) 0.44 - 1.00 mg/dL   Calcium 8.4 (L) 8.9 - 10.3 mg/dL   GFR calc non Af Amer 47 (L) >60 mL/min   GFR calc Af Amer 54 (  L) >60 mL/min    Comment: (NOTE) The eGFR has been calculated using the CKD EPI equation. This calculation has not been validated in all clinical situations. eGFR's persistently <60 mL/min signify possible Chronic Kidney Disease.    Anion gap 6 5 - 15  Glucose, capillary     Status: Abnormal   Collection Time: 10/19/15  8:03 AM  Result Value Ref Range   Glucose-Capillary 105 (H) 65 - 99 mg/dL  Glucose, capillary     Status: Abnormal   Collection Time: 10/19/15 11:45 AM  Result Value Ref Range   Glucose-Capillary 114 (H) 65 - 99 mg/dL  Glucose, capillary     Status: Abnormal   Collection Time: 10/19/15  4:38 PM  Result Value Ref Range   Glucose-Capillary 109 (H) 65 - 99 mg/dL  Glucose, capillary     Status: None   Collection Time: 10/19/15  9:56 PM  Result Value Ref Range   Glucose-Capillary 95 65 - 99 mg/dL  Glucose, capillary     Status: Abnormal   Collection Time: 10/20/15  7:45 AM  Result Value Ref Range   Glucose-Capillary 104 (H) 65 - 99 mg/dL    Imaging / Studies: No results found.  Medications / Allergies:   Scheduled Meds: . atenolol  25 mg Oral Daily  . cefTRIAXone (ROCEPHIN)  IV  1 g Intravenous Q24H  . collagenase   Topical Daily  . ferrous sulfate  325 mg Oral Q breakfast  . heparin  5,000 Units Subcutaneous 3 times per day  . hydrocerin   Topical BID  . imipramine  50 mg Oral TID  . insulin aspart  0-9 Units Subcutaneous TID WC  . lactulose  30 g Oral QODAY  . loratadine  10 mg Oral Daily  . metronidazole  500 mg Intravenous Q8H  . sodium chloride  3 mL Intravenous Q12H   Continuous Infusions: . sodium chloride 1,000 mL (10/18/15 0204)   PRN Meds:.albuterol, ipratropium-albuterol, morphine injection, nitroGLYCERIN, ondansetron **OR** ondansetron (ZOFRAN) IV, sodium chloride, sodium phosphate  Antibiotics: Anti-infectives    Start     Dose/Rate Route Frequency Ordered Stop   10/15/15 1115  cefTRIAXone (ROCEPHIN) 1 g in dextrose 5 % 50 mL IVPB     1 g 100 mL/hr over 30 Minutes Intravenous Every 24 hours 10/15/15 1109     10/15/15 1115  metroNIDAZOLE (FLAGYL) IVPB 500 mg     500 mg 100 mL/hr over 60 Minutes Intravenous Every 8 hours 10/15/15 1109     10/15/15 0030  piperacillin-tazobactam (ZOSYN) IVPB 3.375 g  Status:  Discontinued     3.375 g 12.5 mL/hr over 240 Minutes Intravenous Every 8 hours 10/14/15 1755 10/15/15 1111   10/14/15 1700  piperacillin-tazobactam (ZOSYN) IVPB 3.375 g     3.375 g 100 mL/hr over 30 Minutes Intravenous  Once 10/14/15 1653 10/14/15 1824   10/14/15 1700  vancomycin (VANCOCIN) IVPB 1000 mg/200 mL premix  Status:  Discontinued     1,000 mg 200 mL/hr over 60 Minutes Intravenous  Once 10/14/15 1653 10/14/15 1656   10/14/15 1700  vancomycin (VANCOCIN) 2,000 mg in sodium chloride 0.9 % 500 mL IVPB     2,000 mg 250 mL/hr over 120 Minutes Intravenous  Once 10/14/15 1657 10/14/15 2114        Assessment/Plan Stage 4 buttock wound S/p bedside I&D -continue hydrotherapy to help clean up the significant amount of fibrinous tissue at the base of the  wound. -continue santyl -continue BID wet to dry dressing  changes -hopefully can VAC once clean   Erby Pian, [facility] Surgery Pager [phone](7A-4:30P) For consults and floor pages call [phone](7A-4:30P)  10/20/2015 10:55 AM

## 2015-10-20 NOTE — Evaluation (Signed)
Physical Therapy Evaluation Patient Details Name: Lindsay SkillernHelen M Cobb MRN: [id number] DOB: 09/06/1941 Today's Date: 10/20/2015   History of Present Illness  74 y.o. female with L patellar fx sustained in a fall. She underwent partial patellectomy 07-11-15. Returned for infection of L knee and thigh, with irrigation and debridement before being d/c to nursing facility. 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 Readmitted with L hip decubitus ulcer  Clinical Impression   Pt admitted with above diagnosis. Pt currently with functional limitations due to the deficits listed below (see PT Problem List).  Pt will benefit from skilled PT to increase their independence and safety with mobility to allow discharge to the venue listed below.    Recommend using lift for safe transfers OOB     Follow Up Recommendations SNF    Equipment Recommendations  None recommended by PT    Recommendations for Other Services       Precautions / Restrictions Precautions Precautions: Fall Precaution Comments: L AKA Restrictions LLE Weight Bearing: Non weight bearing      Mobility  Bed Mobility Overal bed mobility: Needs Assistance Bed Mobility: Supine to Sit;Sit to Supine     Supine to sit: Mod assist;+2 for physical assistance Sit to supine: Mod assist;+2 for physical assistance   General bed mobility comments: pt able to bring R LE off EOB and initiate transfer with UEs but con't to require assist for trunk elevation and to bring hips to EOB for optimal sitting position  Transfers Overall transfer level: Needs assistance Equipment used:  (bed pad) Transfers: Sit to/from Stand;Lateral/Scoot Transfers Sit to Stand: Total assist;+2 physical assistance (unsuccessful attempt)        Lateral/Scoot Transfers: +2 physical assistance;Max assist General transfer comment: Unsuccessful attempt at sit to stand with R knee blocked, but pt did not weight shift forward or push through RLE or UEs;  simulated lateral scoots at EOB, [facility]  Ambulation/Gait                Stairs            Wheelchair Mobility    Modified Rankin (Stroke Patients Only)       Balance Overall balance assessment: Needs assistance   Sitting balance-Leahy Scale: Fair Sitting balance - Comments: sat EOB x 10 min                                     Pertinent Vitals/Pain Pain Assessment: Faces Faces Pain Scale: Hurts little more Pain Location: [place] Pain Descriptors / Indicators: Grimacing Pain Intervention(s): Limited activity within patient's tolerance;Monitored during session;Repositioned    Home Living Family/patient expects to be discharged to:: Skilled nursing facility                      Prior Function Level of Independence: Needs assistance   Gait / Transfers Assistance Needed: Pt does not state. Reports she does not walk or transfer at [facility], but that she was walking and driving this summer before all of these admissions began           Hand Dominance   Dominant Hand: Right    Extremity/Trunk Assessment   Upper Extremity Assessment: Generalized weakness           Lower Extremity Assessment: Generalized weakness RLE Deficits / Details: Significant RLE weakness, 3+/5 knee extension, unable to flex hip against gravity in seated  position. LLE Deficits / Details: Limited assessment due to pain     Communication   Communication: No difficulties;HOH  Cognition Arousal/Alertness: Awake/alert Behavior During Therapy: WFL for tasks assessed/performed Overall Cognitive Status: Within Functional Limits for tasks assessed               Problem Solving: Decreased initiation      General Comments      Exercises        Assessment/Plan    PT Assessment Patient needs continued PT services  PT Diagnosis Generalized weakness;Difficulty walking;Altered mental status;Acute pain   PT Problem List Decreased  strength;Decreased range of motion;Decreased activity tolerance;Decreased balance;Decreased mobility;Decreased cognition;Decreased knowledge of use of DME;Decreased safety awareness;Decreased knowledge of precautions;Obesity;Pain;Decreased skin integrity  PT Treatment Interventions DME instruction;Functional mobility training;Therapeutic activities;Therapeutic exercise;Balance training;Neuromuscular re-education;Cognitive remediation;Patient/family education;Wheelchair mobility training;Modalities   PT Goals (Current goals can be found in the Care Plan section) Acute Rehab PT Goals Patient Stated Goal: to rehab then home PT Goal Formulation: With patient Time For Goal Achievement: 11/03/15 Potential to Achieve Goals: Fair    Frequency Min 2X/week   Barriers to discharge        Co-evaluation               End of Session Equipment Utilized During Treatment:  (bed pad) Activity Tolerance: Patient limited by pain Patient left: in bed;with call bell/phone within reach Nurse Communication: Mobility status         Time: 2951-8841 PT Time Calculation (min) (ACUTE ONLY): 25 min   Charges:   PT Evaluation $Initial PT Evaluation Tier I: 1 Procedure PT Treatments $Therapeutic Activity: 23-37 mins   PT G Codes:        Olen Pel 10/20/2015, 4:37 PM  Van Clines, PT  Acute Rehabilitation Services Pager [phone] Office [phone]

## 2015-10-20 NOTE — Progress Notes (Signed)
[facility] PROGRESS NOTE  Lindsay Cobb ZOX:[id number] DOB: 01-Mar-1941 DOA: 10/14/2015 PCP: [name], MD  Assessment/Plan:  Principal Problem:   Decubitus ulcer of left buttock, stage 4 [facility]): s/p surgical debridement. continue pulse lavage. cx grew e coli sensitive to rocephin. Continue same. Appreciate surgery and ID. Still with purulent drainage. PT consult for OOB  Active Problems: Possible UTI: Urine cx with 80K E coli colonies . History of MRSA bacteremia and septic knee, status post left AKA: no further need for daptomycin per ID. Dr. [name] evaluated and removed staples and sutures    Type II diabetes mellitus (HCC): Metformin held due to renal failure. Continue sliding scale insulin. Reasonable control.    Essential hypertension, benign: Normotensive off medications    Obesity    Anxiety: stable    Anemia of chronic disease: Had anemia panel last admission. Hemoglobin has dropped with hydration, may be dilution. No overt bleed    Hyponatremia; mild. improving    Constipation: continue lactulose  dermatitis: resolved  Status post recent left AKA on 10/23. See above  Confusion/encephalopathy: This was an issue during last hospitalization. improved  Urinary retention: continue foley while getting woundcare. Eventual voiding trial  Acute kidney injury: Likely from infection, consider component retention contributing. improving  Code Status:  full Family Communication:   husband Disposition Plan:  back to skilled nursing facility when wound stable/off pulse lavage  Consultants:   surgery  Infectious disease   Procedures:     Antibiotics:   ceftriaxone, flagyl  HPI/Subjective: No complaints. Wants to get OOB  Objective: Filed Vitals:   10/20/15 0433  BP: 151/74  Pulse: 91  Temp: 98.1 F (36.7 C)  Resp: 16    Intake/Output Summary (Last 24 hours) at 10/20/15 1013 Last data filed at 10/20/15 0436  Gross per 24 hour  Intake     643 ml  Output   1500 ml  Net   -857 ml   Filed Weights   10/14/15 2244 10/16/15 2059  Weight: 93.35 kg (205 lb 12.8 oz) 94.167 kg (207 lb 9.6 oz)    Exam:   General:   alert. appropriate  Cardiovascular:  regular rate rhythm without murmurs gallops rubs   Respiratory: Clear to auscultation bilaterally without wheezes rhonchi or rales   Abdomen:  soft nontender nondistended   Ext:  no clubbing cyanosis or edema. Left AKA stump dressing clean dry and intact  Neurologic: Nonfocal  Skin: buttock wound with purulent drainage. Grey exudate with underlying granulation  Basic Metabolic Panel:  Recent Labs Lab 10/14/15 1735 10/15/15 0420 10/16/15 0510 10/17/15 0340 10/19/15 0446  NA 127* 130* 132* 134* 135  K 4.2 4.4 3.6 3.4* 3.8  CL 92* 96* 98* 100* 103  CO2 GLUCOSE 131* 191* 126* 112* 120*  BUN 14 17 23* 20 8  CREATININE 1.52* 1.80* 1.99* 1.61* 1.13*  CALCIUM 8.3* 7.9* 7.9* 8.0* 8.4*   Liver Function Tests:  Recent Labs Lab 10/13/15 1500 10/14/15 1735 10/15/15 0420  AST 20 19 14*  ALT 14 13* 12*  ALKPHOS 89 93 84  BILITOT 0.5 0.6 1.0  PROT 6.7 6.7 5.8*  ALBUMIN 2.1* 2.0* 1.6*   No results for input(s): LIPASE, AMYLASE in the last 168 hours. No results for input(s): AMMONIA in the last 168 hours. CBC:  Recent Labs Lab 10/13/15 1500 10/14/15 1735 10/15/15 0420 10/16/15 0510 10/17/15 0340 10/19/15 0446  WBC 14.2* 12.6* 13.0* 15.3* 11.8* 9.0  NEUTROABS 9.7* 8.3*  --  10.1*  --   --   HGB 7.9* 8.2* 7.0* 7.7* 7.3* 7.6*  HCT 24.9* 26.0* 22.6* 24.0* 23.5* 24.8*  MCV 78.8 76.9* 77.1* 77.7* 77.0* 78.5  PLT 451* 454* 390 452* 478* 472*   Cardiac Enzymes:  Recent Labs Lab 10/13/15 1500  CKTOTAL 25*  CKMB 1.6   BNP (last 3 results)  Recent Labs  09/26/15 1414  BNP 48.0    ProBNP (last 3 results) No results for input(s): PROBNP in the last 8760 hours.  CBG:  Recent Labs Lab 10/19/15 0803 10/19/15 1145 10/19/15 1638  10/19/15 2156 10/20/15 0745  GLUCAP 105* 114* 109* 95 104*    Recent Results (from the past 240 hour(s))  Wound culture     Status: None   Collection Time: 10/13/15  4:10 PM  Result Value Ref Range Status   Specimen Description WOUND LEFT BUTTOCK  Final   Special Requests NONE  Final   Gram Stain   Final    MODERATE WBC PRESENT,BOTH PMN AND MONONUCLEAR NO SQUAMOUS EPITHELIAL CELLS SEEN FEW GRAM NEGATIVE RODS Performed at [facility]    Culture   Final    ABUNDANT ESCHERICHIA COLI Performed at [facility]    Report Status 10/16/2015 FINAL  Final   Organism ID, Bacteria ESCHERICHIA COLI  Final      Susceptibility   Escherichia coli - MIC*    AMPICILLIN >=32 RESISTANT Resistant     AMPICILLIN/SULBACTAM <=2 SENSITIVE Sensitive     CEFAZOLIN 16 RESISTANT Resistant     CEFEPIME <=1 SENSITIVE Sensitive     CEFTAZIDIME <=1 SENSITIVE Sensitive     CEFTRIAXONE <=1 SENSITIVE Sensitive     CIPROFLOXACIN >=4 RESISTANT Resistant     GENTAMICIN <=1 SENSITIVE Sensitive     IMIPENEM <=0.25 SENSITIVE Sensitive     PIP/TAZO <=4 SENSITIVE Sensitive     TOBRAMYCIN <=1 SENSITIVE Sensitive     TRIMETH/SULFA Value in next row Sensitive      <=20 SENSITIVE(NOTE)    * ABUNDANT ESCHERICHIA COLI  Blood Culture (routine x 2)     Status: None   Collection Time: 10/14/15  5:20 PM  Result Value Ref Range Status   Specimen Description BLOOD RIGHT ARM  Final   Special Requests IN PEDIATRIC BOTTLE 4CC  Final   Culture NO GROWTH 5 DAYS  Final   Report Status 10/19/2015 FINAL  Final  Blood Culture (routine x 2)     Status: None   Collection Time: 10/14/15  5:30 PM  Result Value Ref Range Status   Specimen Description BLOOD RIGHT HAND  Final   Special Requests BOTTLES DRAWN AEROBIC AND ANAEROBIC 2CC  Final   Culture NO GROWTH 5 DAYS  Final   Report Status 10/19/2015 FINAL  Final  Urine culture     Status: None   Collection Time: 10/14/15  8:07 PM  Result Value Ref Range Status    Specimen Description URINE, CLEAN CATCH  Final   Special Requests NONE  Final   Culture 80,000 COLONIES/ml ESCHERICHIA COLI  Final   Report Status 10/16/2015 FINAL  Final   Organism ID, Bacteria ESCHERICHIA COLI  Final      Susceptibility   Escherichia coli - MIC*    AMPICILLIN 4 SENSITIVE Sensitive     CEFAZOLIN <=4 SENSITIVE Sensitive     CEFTRIAXONE <=1 SENSITIVE Sensitive     CIPROFLOXACIN >=4 RESISTANT Resistant     GENTAMICIN <=1 SENSITIVE Sensitive     IMIPENEM <=0.25 SENSITIVE Sensitive  NITROFURANTOIN <=16 SENSITIVE Sensitive     TRIMETH/SULFA <=20 SENSITIVE Sensitive     AMPICILLIN/SULBACTAM <=2 SENSITIVE Sensitive     PIP/TAZO <=4 SENSITIVE Sensitive     * 80,000 COLONIES/ml ESCHERICHIA COLI     Studies: No results found.  Scheduled Meds: . cefTRIAXone (ROCEPHIN)  IV  1 g Intravenous Q24H  . collagenase   Topical Daily  . ferrous sulfate  325 mg Oral Q breakfast  . heparin  5,000 Units Subcutaneous 3 times per day  . hydrocerin   Topical BID  . imipramine  50 mg Oral TID  . insulin aspart  0-9 Units Subcutaneous TID WC  . lactulose  30 g Oral QODAY  . loratadine  10 mg Oral Daily  . metronidazole  500 mg Intravenous Q8H  . sodium chloride  3 mL Intravenous Q12H   Continuous Infusions: . sodium chloride 1,000 mL (10/18/15 0204)    Time spent: 15 minutes  Andrez Lieurance L  [facility] [email or web address], password [email or web address] 10/20/2015, 10:13 AM  LOS: 6 days

## 2015-10-20 NOTE — Progress Notes (Signed)
Physical Therapy Wound Treatment Patient Details  Name: ERISHA PAUGH MRN: [id number] Date of Birth: 1941-04-26  Today's Date: 10/20/2015 Time: 0952-1020 Time Calculation (min): 28 min  Subjective  Subjective: Pt reports pain with probing of wound by surgery. Patient and Family Stated Goals: Heal wound Date of Onset:  (admitted with pressure ulcer) Prior Treatments: I&D on 11/09  Pain Score:  Pain with probing and packing of wound. Nurse in and gave pain meds.  Wound Assessment  Pressure Ulcer Unstageable - Full thickness tissue loss in which the base of the ulcer is covered by slough (yellow, tan, gray, green or brown) and/or eschar (tan, brown or black) in the wound bed. (Active)  Dressing Type Gauze (Comment);Moist to dry;Tape dressing;Other (Comment);ABD 10/20/2015 11:22 AM  Dressing Changed;Clean;Dry;Intact 10/20/2015 11:22 AM  Dressing Change Frequency Twice a day 10/20/2015 11:22 AM  State of Healing Early/partial granulation 10/20/2015 11:22 AM  Site / Wound Assessment Painful;Pink;Red;Yellow 10/20/2015 11:22 AM  % Wound base Red or Granulating 40% 10/20/2015 11:22 AM  % Wound base Yellow 60% 10/20/2015 11:22 AM  % Wound base Black 20% 10/14/2015 11:23 PM  Peri-wound Assessment Intact;Induration 10/20/2015 11:22 AM  Wound Length (cm) 7.2 cm 10/16/2015 12:40 PM  Wound Width (cm) 4.1 cm 10/16/2015 12:40 PM  Wound Depth (cm) 3 cm 10/16/2015 12:40 PM  Tunneling (cm) 0 09/26/2015 10:53 AM  Undermining (cm) 3.1cm from 9:00 to 3:00 10/16/2015 12:40 PM  Margins Unattached edges (unapproximated) 10/20/2015 11:22 AM  Drainage Amount Copious 10/20/2015 11:22 AM  Drainage Description Purulent 10/20/2015 11:22 AM  Treatment Debridement (Selective);Hydrotherapy (Pulse lavage);Packing (Saline gauze) 10/20/2015 11:22 AM   Santyl applied to wound bed prior to applying dressing.    Hydrotherapy Pulsed lavage therapy - wound location: Left buttock Pulsed Lavage with Suction (psi): 8 psi  (4-12psi) Pulsed Lavage with Suction - Normal Saline Used: 1000 mL Pulsed Lavage Tip: Tip with splash shield Selective Debridement Selective Debridement - Location: Left buttock Selective Debridement - Tools Used: Forceps;Scissors Selective Debridement - Tissue Removed: yellow slough   Wound Assessment and Plan  Wound Therapy - Assess/Plan/Recommendations Wound Therapy - Clinical Statement: Purulent drainage still present. At 3 o'clock wound tracks at least 5 cm. Dr. Conley Canal and surgery P.A. in to see wound. Wound Therapy - Functional Problem List: unable to tolerate prolonged sitting due to pain and risk of progression of decubitus ulcer. Factors Delaying/Impairing Wound Healing: Diabetes Mellitus;Immobility;Multiple medical problems;Polypharmacy Hydrotherapy Plan: Debridement;Dressing change;Patient/family education;Pulsatile lavage with suction Wound Therapy - Frequency: 6X / week Wound Therapy - Current Recommendations: PT Wound Therapy - Follow Up Recommendations:  (per surgery) Wound Plan: see above  Wound Therapy Goals- Improve the function of patient's integumentary system by progressing the wound(s) through the phases of wound healing (inflammation - proliferation - remodeling) by: Decrease Necrotic Tissue to: 50 Decrease Necrotic Tissue - Progress: Progressing toward goal Increase Granulation Tissue to: 50 Increase Granulation Tissue - Progress: Progressing toward goal Improve Drainage Characteristics: Min Improve Drainage Characteristics - Progress: Not progressing  Goals will be updated until maximal potential achieved or discharge criteria met.  Discharge criteria: when goals achieved, discharge from hospital, MD decision/surgical intervention, no progress towards goals, refusal/missing three consecutive treatments without notification or medical reason.  GP     Aidee Latimore 10/20/2015, 11:28 AM Suanne Marker PT [phone]

## 2015-10-20 NOTE — Progress Notes (Signed)
[facility]   Reason for visit: Follow up on infected decubitus ulcer  Interval History: No new issues. growing E coli.  Afebrile and WBC wnl.   Physical Exam: Constitutional:  Filed Vitals:   10/20/15 0433  BP: 151/74  Pulse: 91  Temp: 98.1 F (36.7 C)  Resp: 16   patient appears in NAD GI: soft, nt, nd Skin: ulcers clean, no surrounding erythema Respiratory:  CTAB, normal respiratory effort  Review of Systems: Constitutional: negative for chills Gastrointestinal: negative for diarrhea  Lab Results  Component Value Date   WBC 9.0 10/19/2015   HGB 7.6* 10/19/2015   HCT 24.8* 10/19/2015   MCV 78.5 10/19/2015   PLT 472* 10/19/2015    Lab Results  Component Value Date   CREATININE 1.13* 10/19/2015   BUN 8 10/19/2015   NA 135 10/19/2015   K 3.8 10/19/2015   CL 103 10/19/2015   CO2 26 10/19/2015    Lab Results  Component Value Date   ALT 12* 10/15/2015   AST 14* 10/15/2015   ALKPHOS 84 10/15/2015     Microbiology: Recent Results (from the past 240 hour(s))  Wound culture     Status: None   Collection Time: 10/13/15  4:10 PM  Result Value Ref Range Status   Specimen Description WOUND LEFT BUTTOCK  Final   Special Requests NONE  Final   Gram Stain   Final    MODERATE WBC PRESENT,BOTH PMN AND MONONUCLEAR NO SQUAMOUS EPITHELIAL CELLS SEEN FEW GRAM NEGATIVE RODS Performed at [facility]    Culture   Final    ABUNDANT ESCHERICHIA COLI Performed at [facility]    Report Status 10/16/2015 FINAL  Final   Organism ID, Bacteria ESCHERICHIA COLI  Final      Susceptibility   Escherichia coli - MIC*    AMPICILLIN >=32 RESISTANT Resistant     AMPICILLIN/SULBACTAM <=2 SENSITIVE Sensitive     CEFAZOLIN 16 RESISTANT Resistant     CEFEPIME <=1 SENSITIVE Sensitive     CEFTAZIDIME <=1 SENSITIVE Sensitive     CEFTRIAXONE <=1 SENSITIVE Sensitive     CIPROFLOXACIN >=4 RESISTANT Resistant     GENTAMICIN <=1 SENSITIVE  Sensitive     IMIPENEM <=0.25 SENSITIVE Sensitive     PIP/TAZO <=4 SENSITIVE Sensitive     TOBRAMYCIN <=1 SENSITIVE Sensitive     TRIMETH/SULFA Value in next row Sensitive      <=20 SENSITIVE(NOTE)    * ABUNDANT ESCHERICHIA COLI  Blood Culture (routine x 2)     Status: None   Collection Time: 10/14/15  5:20 PM  Result Value Ref Range Status   Specimen Description BLOOD RIGHT ARM  Final   Special Requests IN PEDIATRIC BOTTLE 4CC  Final   Culture NO GROWTH 5 DAYS  Final   Report Status 10/19/2015 FINAL  Final  Blood Culture (routine x 2)     Status: None   Collection Time: 10/14/15  5:30 PM  Result Value Ref Range Status   Specimen Description BLOOD RIGHT HAND  Final   Special Requests BOTTLES DRAWN AEROBIC AND ANAEROBIC 2CC  Final   Culture NO GROWTH 5 DAYS  Final   Report Status 10/19/2015 FINAL  Final  Urine culture     Status: None   Collection Time: 10/14/15  8:07 PM  Result Value Ref Range Status   Specimen Description URINE, CLEAN CATCH  Final   Special Requests NONE  Final   Culture 80,000 COLONIES/ml ESCHERICHIA  COLI  Final   Report Status 10/16/2015 FINAL  Final   Organism ID, Bacteria ESCHERICHIA COLI  Final      Susceptibility   Escherichia coli - MIC*    AMPICILLIN 4 SENSITIVE Sensitive     CEFAZOLIN <=4 SENSITIVE Sensitive     CEFTRIAXONE <=1 SENSITIVE Sensitive     CIPROFLOXACIN >=4 RESISTANT Resistant     GENTAMICIN <=1 SENSITIVE Sensitive     IMIPENEM <=0.25 SENSITIVE Sensitive     NITROFURANTOIN <=16 SENSITIVE Sensitive     TRIMETH/SULFA <=20 SENSITIVE Sensitive     AMPICILLIN/SULBACTAM <=2 SENSITIVE Sensitive     PIP/TAZO <=4 SENSITIVE Sensitive     * 80,000 COLONIES/ml ESCHERICHIA COLI    Impression:  1. Decubitus ulcer, infected, likely polymicrobial.  2. History of Staph aureus  Plan: 1. Continue with ceftriaxone IV daily and I will change flagyl to oral 500 mg TID 2. Treat through 11/23 and stop antibiotics, pull picc; no indication for further  treatment after that 3.  Has completed Staph aureus treatment  I will sign off, please call with questions.

## 2015-10-21 DIAGNOSIS — I10 Essential (primary) hypertension: Secondary | ICD-10-CM

## 2015-10-21 LAB — GLUCOSE, CAPILLARY
GLUCOSE-CAPILLARY: 94 mg/dL (ref 65–99)
GLUCOSE-CAPILLARY: 96 mg/dL (ref 65–99)
Glucose-Capillary: 107 mg/dL — ABNORMAL HIGH (ref 65–99)
Glucose-Capillary: 84 mg/dL (ref 65–99)

## 2015-10-21 MED ORDER — METRONIDAZOLE 500 MG PO TABS
500.0000 mg | ORAL_TABLET | Freq: Three times a day (TID) | ORAL | Status: DC
  Administered 2015-10-21 – 2015-10-24 (×10): 500 mg via ORAL
  Filled 2015-10-21 (×10): qty 1

## 2015-10-21 MED ORDER — BENAZEPRIL HCL 20 MG PO TABS: 20.0000 mg | ORAL_TABLET | Freq: Every day | ORAL | Status: DC

## 2015-10-21 MED ORDER — ATENOLOL 50 MG PO TABS
50.0000 mg | ORAL_TABLET | Freq: Every day | ORAL | Status: DC
  Administered 2015-10-22 – 2015-10-24 (×3): 50 mg via ORAL
  Filled 2015-10-21 (×3): qty 1

## 2015-10-21 NOTE — Progress Notes (Signed)
[facility] PROGRESS NOTE  Lindsay Cobb WGN:[id number] DOB: [birth year] DOA: 10/14/2015 PCP: [name], MD  Summary 7 female recently discharged to SNF s/p leftg AKA and MRSA bacteremia. Readmitted with confusion and infected decubitus on buttock. ID and surgery consulted. Had debridement of wound and now getting pulse lavage therapy.  Assessment/Plan:  Principal Problem:   Decubitus ulcer of left buttock, stage 4 [facility]): s/p surgical debridement. continue pulse lavage. cx grew e coli sensitive to rocephin. Continue same. ID recommends 2 weeks total rocephin and flagyl. Now on oral flagyl.  Still with purulent drainage. Back to SNF once wound with less necrotic tissue. Possibly a candidate for woundvack  Active Problems: Possible UTI: Urine cx with 80K E coli colonies . History of MRSA bacteremia and septic knee, status post left AKA: no further need for daptomycin per ID. Dr. Lajoyce Cobb evaluated and removed staples and sutures    Type II diabetes mellitus (HCC): Metformin held due to renal failure. Continue sliding scale insulin. Reasonable control.    Essential hypertension, benign: resuming antihypertensives carefully. Increase tenormin to home dose.    Anemia of chronic disease: Had anemia panel last admission. No overt bleed    Hyponatremia; mild. resolved    Constipation: continue lactulose  Dermatitis on arms: resolved  Status post recent left AKA on 10/23. See above  Toxic encephalopathy: resolved  Urinary retention: continue foley while getting woundcare. Eventual voiding trial  Acute kidney injury: Likely from infection, consider component retention contributing. improving  Code Status:  full Family Communication:   husband Disposition Plan:  back to skilled nursing facility when wound stable/off pulse lavage  Consultants:   surgery  Infectious disease   Procedures:   i and d  Antibiotics:   ceftriaxone, flagyl  HPI/Subjective: Poor  appetite and some nausea with oral flagyl  Objective: Filed Vitals:   10/21/15 0600  BP: 145/69  Pulse: 73  Temp: 97.5 F (36.4 C)  Resp: 18    Intake/Output Summary (Last 24 hours) at 10/21/15 1406 Last data filed at 10/21/15 0600  Gross per 24 hour  Intake     40 ml  Output    700 ml  Net   -660 ml   Filed Weights   10/14/15 2244 10/16/15 2059  Weight: 93.35 kg (205 lb 12.8 oz) 94.167 kg (207 lb 9.6 oz)    Exam:   General:   alert. appropriate  Cardiovascular:  regular rate rhythm without murmurs gallops rubs   Respiratory: Clear to auscultation bilaterally without wheezes rhonchi or rales   Abdomen:  soft nontender nondistended   Ext:  no clubbing cyanosis or edema. Left AKA stump dressing clean dry and intact  Neurologic: Nonfocal  Skin: dressing CDI: viewed yesterday. See note. Per PT, less purulent drainage.  Basic Metabolic Panel:  Recent Labs Lab 10/14/15 1735 10/15/15 0420 10/16/15 0510 10/17/15 0340 10/19/15 0446  NA 127* 130* 132* 134* 135  K 4.2 4.4 3.6 3.4* 3.8  CL 92* 96* 98* 100* 103  CO2 GLUCOSE 131* 191* 126* 112* 120*  BUN 14 17 23* 20 8  CREATININE 1.52* 1.80* 1.99* 1.61* 1.13*  CALCIUM 8.3* 7.9* 7.9* 8.0* 8.4*   Liver Function Tests:  Recent Labs Lab 10/14/15 1735 10/15/15 0420  AST 19 14*  ALT 13* 12*  ALKPHOS 93 84  BILITOT 0.6 1.0  PROT 6.7 5.8*  ALBUMIN 2.0* 1.6*   No results for input(s): LIPASE, AMYLASE in the last 168 hours. No  results for input(s): AMMONIA in the last 168 hours. CBC:  Recent Labs Lab 10/14/15 1735 10/15/15 0420 10/16/15 0510 10/17/15 0340 10/19/15 0446  WBC 12.6* 13.0* 15.3* 11.8* 9.0  NEUTROABS 8.3*  --  10.1*  --   --   HGB 8.2* 7.0* 7.7* 7.3* 7.6*  HCT 26.0* 22.6* 24.0* 23.5* 24.8*  MCV 76.9* 77.1* 77.7* 77.0* 78.5  PLT 454* 390 452* 478* 472*   Cardiac Enzymes: No results for input(s): CKTOTAL, CKMB, CKMBINDEX, TROPONINI in the last 168 hours. BNP (last 3  results)  Recent Labs  09/26/15 1414  BNP 48.0    ProBNP (last 3 results) No results for input(s): PROBNP in the last 8760 hours.  CBG:  Recent Labs Lab 10/20/15 1212 10/20/15 1700 10/20/15 2116 10/21/15 0754 10/21/15 1139  GLUCAP 124* 108* 95 94 107*    Recent Results (from the past 240 hour(s))  Wound culture     Status: None   Collection Time: 10/13/15  4:10 PM  Result Value Ref Range Status   Specimen Description WOUND LEFT BUTTOCK  Final   Special Requests NONE  Final   Gram Stain   Final    MODERATE WBC PRESENT,BOTH PMN AND MONONUCLEAR NO SQUAMOUS EPITHELIAL CELLS SEEN FEW GRAM NEGATIVE RODS [facility]    Culture   Final    ABUNDANT ESCHERICHIA COLI [facility]formed at [facility]    Report Status 10/16/2015 FINAL  Final   Organism ID, Bacteria ESCHERICHIA COLI  Final      Susceptibility   Escherichia coli - MIC*    AMPICILLIN >=32 RESISTANT Resistant     AMPICILLIN/SULBACTAM <=2 SENSITIVE Sensitive     CEFAZOLIN 16 RESISTANT Resistant     CEFEPIME <=1 SENSITIVE Sensitive     CEFTAZIDIME <=1 SENSITIVE Sensitive     CEFTRIAXONE <=1 SENSITIVE Sensitive     CIPROFLOXACIN >=4 RESISTANT Resistant     GENTAMICIN <=1 SENSITIVE Sensitive     IMIPENEM <=0.25 SENSITIVE Sensitive     PIP/TAZO <=4 SENSITIVE Sensitive     TOBRAMYCIN <=1 SENSITIVE Sensitive     TRIMETH/SULFA Value in next row Sensitive      <=20 SENSITIVE(NOTE)    * ABUNDANT ESCHERICHIA COLI  Blood Culture (routine x 2)     Status: None   Collection Time: 10/14/15  5:20 PM  Result Value Ref Range Status   Specimen Description BLOOD RIGHT ARM  Final   Special Requests IN PEDIATRIC BOTTLE 4CC  Final   Culture NO GROWTH 5 DAYS  Final   Report Status 10/19/2015 FINAL  Final  Blood Culture (routine x 2)     Status: None   Collection Time: 10/14/15  5:30 PM  Result Value Ref Range Status   Specimen Description BLOOD RIGHT HAND  Final   Special Requests BOTTLES DRAWN  AEROBIC AND ANAEROBIC 2CC  Final   Culture NO GROWTH 5 DAYS  Final   Report Status 10/19/2015 FINAL  Final  Urine culture     Status: None   Collection Time: 10/14/15  8:07 PM  Result Value Ref Range Status   Specimen Description URINE, CLEAN CATCH  Final   Special Requests NONE  Final   Culture 80,000 COLONIES/ml ESCHERICHIA COLI  Final   Report Status 10/16/2015 FINAL  Final   Organism ID, Bacteria ESCHERICHIA COLI  Final      Susceptibility   Escherichia coli - MIC*    AMPICILLIN 4 SENSITIVE Sensitive     CEFAZOLIN <=4 SENSITIVE Sensitive  CEFTRIAXONE <=1 SENSITIVE Sensitive     CIPROFLOXACIN >=4 RESISTANT Resistant     GENTAMICIN <=1 SENSITIVE Sensitive     IMIPENEM <=0.25 SENSITIVE Sensitive     NITROFURANTOIN <=16 SENSITIVE Sensitive     TRIMETH/SULFA <=20 SENSITIVE Sensitive     AMPICILLIN/SULBACTAM <=2 SENSITIVE Sensitive     PIP/TAZO <=4 SENSITIVE Sensitive     * 80,000 COLONIES/ml ESCHERICHIA COLI     Studies: No results found.  Scheduled Meds: . atenolol  25 mg Oral Daily  . cefTRIAXone (ROCEPHIN)  IV  1 g Intravenous Q24H  . collagenase   Topical Daily  . ferrous sulfate  325 mg Oral Q breakfast  . heparin  5,000 Units Subcutaneous 3 times per day  . hydrocerin   Topical BID  . imipramine  50 mg Oral TID  . insulin aspart  0-9 Units Subcutaneous TID WC  . lactulose  30 g Oral QODAY  . loratadine  10 mg Oral Daily  . metroNIDAZOLE  500 mg Oral 3 times per day  . sodium chloride  3 mL Intravenous Q12H   Continuous Infusions: . sodium chloride 1,000 mL (10/20/15 2200)    Time spent: 15 minutes  Kasondra Junod L  [facility] [email or web address], password [facility] 10/21/2015, 2:06 PM  LOS: 7 days

## 2015-10-21 NOTE — Progress Notes (Signed)
Physical Therapy Wound Treatment Patient Details  Name: Lindsay Cobb MRN: [id number] Date of Birth: Apr 08, 1941  Today's Date: 10/21/2015 Time: 4401-0272 Time Calculation (min): 28 min  Subjective  Subjective: Pt reports she doesn't think she will get to leave until she has more IV antibiotics Patient and Family Stated Goals: Heal wound Date of Onset:  (admitted with pressure ulcer) Prior Treatments: I&D on 11/09  Pain Score: Pain Score: 2   Wound Assessment  Pressure Ulcer Unstageable - Full thickness tissue loss in which the base of the ulcer is covered by slough (yellow, tan, gray, green or brown) and/or eschar (tan, brown or black) in the wound bed. (Active)  Dressing Type Gauze (Comment);Moist to dry;Tape dressing;Other (Comment) 10/21/2015  1:34 PM  Dressing Changed;Clean;Dry;Intact 10/21/2015  1:34 PM  Dressing Change Frequency Twice a day 10/21/2015  1:34 PM  State of Healing Early/partial granulation 10/21/2015  1:34 PM  Site / Wound Assessment Painful;Pink;Red;Yellow 10/21/2015  1:34 PM  % Wound base Red or Granulating 45% 10/21/2015  1:34 PM  % Wound base Yellow 55% 10/21/2015  1:34 PM  % Wound base Black 0% 10/21/2015  1:34 PM  % Wound base Other (Comment) 0% 10/21/2015  1:34 PM  Peri-wound Assessment Intact 10/21/2015  1:34 PM  Wound Length (cm) 7.2 cm 10/16/2015 12:40 PM  Wound Width (cm) 4.1 cm 10/16/2015 12:40 PM  Wound Depth (cm) 3 cm 10/16/2015 12:40 PM  Tunneling (cm) 0 09/26/2015 10:53 AM  Undermining (cm) 3.1cm from 9:00 to 3:00 10/16/2015 12:40 PM  Margins Unattached edges (unapproximated) 10/21/2015  1:34 PM  Drainage Amount Moderate 10/21/2015  1:34 PM  Drainage Description Purulent 10/21/2015  1:34 PM  Treatment Debridement (Selective);Hydrotherapy (Pulse lavage);Packing (Saline gauze) 10/21/2015  1:34 PM   Santyl applied to wound bed prior to applying dressing.    Hydrotherapy Pulsed lavage therapy - wound location: Left buttock Pulsed Lavage with  Suction (psi): 8 psi (4-12psi) Pulsed Lavage with Suction - Normal Saline Used: 1000 mL Pulsed Lavage Tip: Tip with splash shield Selective Debridement Selective Debridement - Location: Left buttock Selective Debridement - Tools Used: Forceps;Scissors Selective Debridement - Tissue Removed: yellow slough   Wound Assessment and Plan  Wound Therapy - Assess/Plan/Recommendations Wound Therapy - Clinical Statement: Progresssing with removal of necrotic tissue. Wound Therapy - Functional Problem List: unable to tolerate prolonged sitting due to pain and risk of progression of decubitus ulcer. Factors Delaying/Impairing Wound Healing: Diabetes Mellitus;Immobility;Multiple medical problems;Polypharmacy Hydrotherapy Plan: Debridement;Dressing change;Patient/family education;Pulsatile lavage with suction Wound Therapy - Frequency: 6X / week Wound Therapy - Current Recommendations: PT Wound Therapy - Follow Up Recommendations:  (per surgery) Wound Plan: see above  Wound Therapy Goals- Improve the function of patient's integumentary system by progressing the wound(s) through the phases of wound healing (inflammation - proliferation - remodeling) by: Decrease Necrotic Tissue to: 50 Decrease Necrotic Tissue - Progress: Progressing toward goal Increase Granulation Tissue to: 50 Increase Granulation Tissue - Progress: Progressing toward goal Improve Drainage Characteristics: Min Improve Drainage Characteristics - Progress: Progressing toward goal  Goals will be updated until maximal potential achieved or discharge criteria met.  Discharge criteria: when goals achieved, discharge from hospital, MD decision/surgical intervention, no progress towards goals, refusal/missing three consecutive treatments without notification or medical reason.  GP     Lindsay Cobb 10/21/2015, 1:38 PM [place]

## 2015-10-21 NOTE — Clinical Social Work Note (Signed)
CSW continuing to follow patient's progress, patient plans to go to [facility] once she is medically ready for discharge and orders have been received.  Ervin KnackEric R. Oday Ridings, MSW, Theresia MajorsLCSWA [phone] 10/21/2015 6:14 PM

## 2015-10-21 NOTE — Care Management Note (Signed)
Case Management Note  Patient Details  Name: Lindsay Cobb MRN: [id number] Date of Birth: 06/27/1941  Subjective/Objective:                    Action/Plan:   Expected Discharge Date:                  Expected Discharge Plan:  Skilled Nursing Facility  In-House Referral:  Clinical Social Work  Discharge planning Services     Post Acute Care Choice:    Choice offered to:     DME Arranged:    DME Agency:     HH Arranged:    HH Agency:     Status of Service:  In process, will continue to follow  Medicare Important Message Given:    Date Medicare IM Given:    Medicare IM give by:    Date Additional Medicare IM Given:    Additional Medicare Important Message give by:     If discussed at Long Length of Stay Meetings, dates discussed:  10-21-15  Additional Comments:UR updated   [name], [name], RN 10/21/2015, 10:26 AM

## 2015-10-22 DIAGNOSIS — L8994 Pressure ulcer of unspecified site, stage 4: Secondary | ICD-10-CM

## 2015-10-22 LAB — BASIC METABOLIC PANEL
Anion gap: 7 (ref 5–15)
BUN: 7 mg/dL (ref 6–20)
CHLORIDE: 103 mmol/L (ref 101–111)
CO2: 26 mmol/L (ref 22–32)
CREATININE: 1.05 mg/dL — AB (ref 0.44–1.00)
Calcium: 8.4 mg/dL — ABNORMAL LOW (ref 8.9–10.3)
GFR calc Af Amer: 60 mL/min — ABNORMAL LOW (ref >60)
GFR calc non Af Amer: 51 mL/min — ABNORMAL LOW (ref >60)
GLUCOSE: 88 mg/dL (ref 65–99)
POTASSIUM: 3.4 mmol/L — AB (ref 3.5–5.1)
SODIUM: 136 mmol/L (ref 135–145)

## 2015-10-22 LAB — CBC
HEMATOCRIT: 25.4 % — AB (ref 36.0–46.0)
Hemoglobin: 7.7 g/dL — ABNORMAL LOW (ref 12.0–15.0)
MCH: 23.9 pg — AB (ref 26.0–34.0)
MCHC: 30.3 g/dL (ref 30.0–36.0)
MCV: 78.9 fL (ref 78.0–100.0)
PLATELETS: 423 10*3/uL — AB (ref 150–400)
RBC: 3.22 MIL/uL — ABNORMAL LOW (ref 3.87–5.11)
RDW: 20.2 % — AB (ref 11.5–15.5)
WBC: 9.8 10*3/uL (ref 4.0–10.5)

## 2015-10-22 LAB — GLUCOSE, CAPILLARY
Glucose-Capillary: 89 mg/dL (ref 65–99)
Glucose-Capillary: 87 mg/dL (ref 65–99)
Glucose-Capillary: 117 mg/dL — ABNORMAL HIGH (ref 65–99)
Glucose-Capillary: 139 mg/dL — ABNORMAL HIGH (ref 65–99)

## 2015-10-22 NOTE — Progress Notes (Signed)
RN went in to change dressing and pt refused, pt educated on importance of changing dressing, still refused, staff was able to reposition pt in bed

## 2015-10-22 NOTE — Progress Notes (Signed)
[facility] PROGRESS NOTE  Lindsay Cobb ZOX:[id number] DOB: [birth year] DOA: 10/14/2015  PCP: [name], MD  Brief HPI: 74 year old African-American female who was discharged recently to a skilled nursing facility after undergoing left above knee amputation with MRSA bacteremia. She had been on daptomycin at the SNF. She presented to the hospital with confusion and infected to this ulcer on the buttock. She was hospitalized for further management.  Past medical history:  Past Medical History  Diagnosis Date  . Diabetes mellitus     takes Januvia,Metformin,and Glipizide daily  . Hyperlipidemia     takes Lovastatin nightly  . Anemia     takes Ferrous Gluconate daily  . Peripheral edema     takes Furosemide daily as needed  . Depression     takes Tofranil daily  . Hypertension     takes Amlodipine,Atenolol,and Benazepril daily  . Arthritis   . Joint pain   . Joint swelling   . Cataracts, bilateral     immature  . Infection of skin of knee     left  . Decubitus ulcer of buttock, unstageable (HCC) 10/2015    Consultants: infectious disease. General surgery.  Procedures: bedside debridement of her decubitus  Antibiotics: Currently on IV Rocephin and oral Flagyl.  Subjective: Patient denies any complaints. Denies any pain. No nausea, vomiting. Patient's husband is at bedside.  Objective: Vital Signs  Filed Vitals:   10/21/15 0600 10/21/15 1445 10/21/15 2058 10/22/15 0534  BP: 145/69 153/77 140/74 148/78  Pulse: 73 75 80 87  Temp: 97.5 F (36.4 C) 97.7 F (36.5 C) 98.1 F (36.7 C) 98.6 F (37 C)  TempSrc: Oral Oral Oral Oral  Resp: Height:      Weight:      SpO2: 97% 94% 97% 97%    Intake/Output Summary (Last 24 hours) at 10/22/15 1021 Last data filed at 10/22/15 0825  Gross per 24 hour  Intake 1664.17 ml  Output   1600 ml  Net  64.17 ml   Filed Weights   10/14/15 2244 10/16/15 2059  Weight: 93.35 kg (205 lb 12.8 oz) 94.167  kg (207 lb 9.6 oz)    General appearance: alert, cooperative, appears stated age and no distress Resp: clear to auscultation bilaterally Cardio: regular rate and rhythm, S1, S2 normal, no murmur, click, rub or gallop GI: soft, non-tender; bowel sounds normal; no masses,  no organomegaly Extremities: status post left AKA Neurologic: alert and oriented 3. No focal neurological deficits.  Lab Results:  Basic Metabolic Panel:  Recent Labs Lab 10/16/15 0510 10/17/15 0340 10/19/15 0446 10/22/15 0435  NA 132* 134* 135 136  K 3.6 3.4* 3.8 3.4*  CL 98* 100* 103 103  CO2 GLUCOSE 126* 112* 120* 88  BUN 23* CREATININE 1.99* 1.61* 1.13* 1.05*  CALCIUM 7.9* 8.0* 8.4* 8.4*   CBC:  Recent Labs Lab 10/16/15 0510 10/17/15 0340 10/19/15 0446 10/22/15 0435  WBC 15.3* 11.8* 9.0 9.8  NEUTROABS 10.1*  --   --   --   HGB 7.7* 7.3* 7.6* 7.7*  HCT 24.0* 23.5* 24.8* 25.4*  MCV 77.7* 77.0* 78.5 78.9  PLT 452* 478* 472* 423*   BNP (last 3 results)  Recent Labs  09/26/15 1414  BNP 48.0   CBG:  Recent Labs Lab 10/21/15 0754 10/21/15 1139 10/21/15 1647 10/21/15 2101 10/22/15 0719  GLUCAP 94 107* 84 96 89    Recent Results (from  the past 240 hour(s))  Wound culture     Status: None   Collection Time: 10/13/15  4:10 PM  Result Value Ref Range Status   Specimen Description WOUND LEFT BUTTOCK  Final   Special Requests NONE  Final   Gram Stain   Final    MODERATE WBC PRESENT,BOTH PMN AND MONONUCLEAR NO SQUAMOUS EPITHELIAL CELLS SEEN FEW GRAM NEGATIVE RODS Performed at [facility]    Culture   Final    ABUNDANT ESCHERICHIA COLI Performed at [facility]    Report Status 10/16/2015 FINAL  Final   Organism ID, Bacteria ESCHERICHIA COLI  Final      Susceptibility   Escherichia coli - MIC*    AMPICILLIN >=32 RESISTANT Resistant     AMPICILLIN/SULBACTAM <=2 SENSITIVE Sensitive     CEFAZOLIN 16 RESISTANT Resistant     CEFEPIME <=1  SENSITIVE Sensitive     CEFTAZIDIME <=1 SENSITIVE Sensitive     CEFTRIAXONE <=1 SENSITIVE Sensitive     CIPROFLOXACIN >=4 RESISTANT Resistant     GENTAMICIN <=1 SENSITIVE Sensitive     IMIPENEM <=0.25 SENSITIVE Sensitive     PIP/TAZO <=4 SENSITIVE Sensitive     TOBRAMYCIN <=1 SENSITIVE Sensitive     TRIMETH/SULFA Value in next row Sensitive      <=20 SENSITIVE(NOTE)    * ABUNDANT ESCHERICHIA COLI  Blood Culture (routine x 2)     Status: None   Collection Time: 10/14/15  5:20 PM  Result Value Ref Range Status   Specimen Description BLOOD RIGHT ARM  Final   Special Requests IN PEDIATRIC BOTTLE 4CC  Final   Culture NO GROWTH 5 DAYS  Final   Report Status 10/19/2015 FINAL  Final  Blood Culture (routine x 2)     Status: None   Collection Time: 10/14/15  5:30 PM  Result Value Ref Range Status   Specimen Description BLOOD RIGHT HAND  Final   Special Requests BOTTLES DRAWN AEROBIC AND ANAEROBIC 2CC  Final   Culture NO GROWTH 5 DAYS  Final   Report Status 10/19/2015 FINAL  Final  Urine culture     Status: None   Collection Time: 10/14/15  8:07 PM  Result Value Ref Range Status   Specimen Description URINE, CLEAN CATCH  Final   Special Requests NONE  Final   Culture 80,000 COLONIES/ml ESCHERICHIA COLI  Final   Report Status 10/16/2015 FINAL  Final   Organism ID, Bacteria ESCHERICHIA COLI  Final      Susceptibility   Escherichia coli - MIC*    AMPICILLIN 4 SENSITIVE Sensitive     CEFAZOLIN <=4 SENSITIVE Sensitive     CEFTRIAXONE <=1 SENSITIVE Sensitive     CIPROFLOXACIN >=4 RESISTANT Resistant     GENTAMICIN <=1 SENSITIVE Sensitive     IMIPENEM <=0.25 SENSITIVE Sensitive     NITROFURANTOIN <=16 SENSITIVE Sensitive     TRIMETH/SULFA <=20 SENSITIVE Sensitive     AMPICILLIN/SULBACTAM <=2 SENSITIVE Sensitive     PIP/TAZO <=4 SENSITIVE Sensitive     * 80,000 COLONIES/ml ESCHERICHIA COLI      Studies/Results: No results found.  Medications:  Scheduled: . atenolol  50 mg Oral  Daily  . cefTRIAXone (ROCEPHIN)  IV  1 g Intravenous Q24H  . collagenase   Topical Daily  . ferrous sulfate  325 mg Oral Q breakfast  . heparin  5,000 Units Subcutaneous 3 times per day  . hydrocerin   Topical BID  . imipramine  50 mg Oral TID  .  insulin aspart  0-9 Units Subcutaneous TID WC  . lactulose  30 g Oral QODAY  . loratadine  10 mg Oral Daily  . metroNIDAZOLE  500 mg Oral 3 times per day  . sodium chloride  3 mL Intravenous Q12H   Continuous: . sodium chloride 1,000 mL (10/20/15 2200)   ZOX:WRUEAVWUJ, ipratropium-albuterol, morphine injection, nitroGLYCERIN, ondansetron **OR** ondansetron (ZOFRAN) IV, sodium chloride, sodium phosphate  Assessment/Plan:  Principal Problem:   Stage 4 decubitus ulcer (HCC) Active Problems:   Type II diabetes mellitus (HCC)   Essential hypertension, benign   Obesity   Anxiety   Anemia of chronic disease   Hyponatremia   Constipation   Leukocytosis   S/P AKA (above knee amputation) unilateral (HCC)    Decubitus ulcer of left buttock, stage 4 S/p surgical debridement. Continue pulse lavage/hydrotherapy per general surgery. Cultures grew e coli sensitive to rocephin. Continue same. ID recommends 2 weeks total rocephin and flagyl to end on 11/23. Back to SNF once wound with less necrotic tissue. Possibly a candidate for woundvac.  Possible UTI Urine cx with 80K E coli colonies. Will be covered by ceftriaxone.  History of MRSA bacteremia and septic knee, status post left AKA No further need for daptomycin per ID. Dr. [name] evaluated and removed staples and sutures.  Type II diabetes mellitus Metformin held due to renal failure. Continue sliding scale insulin. Reasonable control.  Essential hypertension, benign Continue atenolol. Monitor blood pressures closely.  Anemia of chronic disease Had anemia panel last admission. No overt bleed  Hyponatremia; mild Resolved  Constipation Continue lactulose  Dermatitis on  arms Resolved  Status post recent left AKA on 10/23 See above  Toxic encephalopathy Resolved  Urinary retention Continue foley while getting woundcare. Eventual voiding trial. Could also be done at [facility].  Acute kidney injury Likely from infection and urinary retention. Now improved.  DVT Prophylaxis: subcutaneous heparin    Code Status: full code  Family Communication: discussed with the patient and her husband  Disposition Plan: await improvement in wound. Will return to SNF eventually.    LOS: 8 days   [facility]  [facility] Pager [phone] 10/22/2015, 10:21 AM  If 7PM-7AM, please contact night-coverage at [email or web address], password [facility]

## 2015-10-22 NOTE — Progress Notes (Signed)
Pt a/o, no c/o pain, pt premedicated prior to dressing change, pt resting comfortably in bed, VSS, pt stable

## 2015-10-22 NOTE — Progress Notes (Signed)
Physical Therapy Wound Treatment Patient Details  Name: EVALEEN SANT MRN: [id number] Date of Birth: 06-Oct-1941  Today's Date: 10/22/2015 Time: 1032-1059 Time Calculation (min): 27 min  Subjective  Subjective: I have not seen you in a few days. Patient and Family Stated Goals: Heal wound Date of Onset:  (admitted with pressure ulcer) Prior Treatments: I&D on 11/09  Pain Score:   Occasional periods of pain during debridement. Pt states she is tolerating well though and did not require any rest breaks. No numerical value given.  Wound Assessment  Pressure Ulcer Unstageable - Full thickness tissue loss in which the base of the ulcer is covered by slough (yellow, tan, gray, green or brown) and/or eschar (tan, brown or black) in the wound bed. (Active)  Dressing Type Gauze (Comment);Moist to dry;Tape dressing;Other (Comment);ABD 10/22/2015 11:00 AM  Dressing Changed;Clean;Dry;Intact 10/22/2015 11:00 AM  Dressing Change Frequency Twice a day 10/22/2015 11:00 AM  State of Healing Early/partial granulation 10/22/2015 11:00 AM  Site / Wound Assessment Painful;Pink;Red;Yellow;Pale;Granulation tissue 10/22/2015 11:00 AM  % Wound base Red or Granulating 45% 10/22/2015 11:00 AM  % Wound base Yellow 55% 10/22/2015 11:00 AM  % Wound base Black 0% 10/22/2015 11:00 AM  % Wound base Other (Comment) 0% 10/22/2015 11:00 AM  Peri-wound Assessment Intact 10/22/2015 11:00 AM  Wound Length (cm) 7.2 cm 10/16/2015 12:40 PM  Wound Width (cm) 4.1 cm 10/16/2015 12:40 PM  Wound Depth (cm) 3 cm 10/16/2015 12:40 PM  Tunneling (cm) 0 09/26/2015 10:53 AM  Undermining (cm) 3.1cm from 9:00 to 3:00 10/16/2015 12:40 PM  Margins Unattached edges (unapproximated) 10/22/2015 11:00 AM  Drainage Amount Moderate 10/22/2015 11:00 AM  Drainage Description Purulent;Serosanguineous 10/22/2015 11:00 AM  Treatment Debridement (Selective);Hydrotherapy (Pulse lavage);Packing (Impregnated strip);Tape changed;Other (Comment)  10/22/2015 11:00 AM     Santyl applied to wound bed prior to applying dressing.  Hydrotherapy Pulsed lavage therapy - wound location: Left buttock Pulsed Lavage with Suction (psi): 8 psi (4-12psi) Pulsed Lavage with Suction - Normal Saline Used: 1000 mL Pulsed Lavage Tip: Tip with splash shield Selective Debridement Selective Debridement - Location: Left buttock Selective Debridement - Tools Used: Forceps;Scissors Selective Debridement - Tissue Removed: yellow slough   Wound Assessment and Plan  Wound Therapy - Assess/Plan/Recommendations Wound Therapy - Clinical Statement: Significant amount of necrotic tissue removed from cephalic end of wound. Caudal portion granulating well with some areas of pale/pink tissue. Continues to make progress. Wound Therapy - Functional Problem List: unable to tolerate prolonged sitting due to pain and risk of progression of decubitus ulcer. Factors Delaying/Impairing Wound Healing: Diabetes Mellitus;Immobility;Multiple medical problems;Polypharmacy Hydrotherapy Plan: Debridement;Dressing change;Patient/family education;Pulsatile lavage with suction Wound Therapy - Frequency: 6X / week Wound Therapy - Current Recommendations: PT Wound Therapy - Follow Up Recommendations:  (per surgery) Wound Plan: see above  Wound Therapy Goals- Improve the function of patient's integumentary system by progressing the wound(s) through the phases of wound healing (inflammation - proliferation - remodeling) by: Decrease Necrotic Tissue to: 50 Decrease Necrotic Tissue - Progress: Progressing toward goal Increase Granulation Tissue to: 50 Increase Granulation Tissue - Progress: Progressing toward goal Improve Drainage Characteristics: Min Improve Drainage Characteristics - Progress: Progressing toward goal  Goals will be updated until maximal potential achieved or discharge criteria met.  Discharge criteria: when goals achieved, discharge from hospital, MD  decision/surgical intervention, no progress towards goals, refusal/missing three consecutive treatments without notification or medical reason.  GP     Candie Mile S 10/22/2015, 11:04 AM  Elayne Snare, [place]

## 2015-10-22 NOTE — Care Management Important Message (Signed)
Important Message  Patient Details  Name: Nani SkillernHelen M Rey MRN: [id number] Date of Birth: 05/15/1941   Medicare Important Message Given:   yes    [name], Reyes Fifield Marie, RN 10/22/2015, 11:04 AM

## 2015-10-22 NOTE — Progress Notes (Signed)
Physical Therapy Treatment Patient Details Name: Lindsay Cobb MRN: [id number] DOB: 1941/06/18 Today's Date: 10/22/2015    History of Present Illness 74 y.o. female with L patellar fx sustained in a fall. She underwent partial patellectomy 07-11-15. Returned for infection of L knee and thigh, with irrigation and debridement before being d/c to nursing facility. 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 Readmitted with L hip decubitus ulcer    PT Comments    Very good participation in initial efforts at  anterior posterior transfer with focus on lateral weight shifting for reciprocal scooting; Pt seemed quite pleased to be OOB; Nursing to use Maximove and amputee sling for assist back to bed   Follow Up Recommendations  SNF     Equipment Recommendations  None recommended by PT    Recommendations for Other Services       Precautions / Restrictions Precautions Precautions: Fall Precaution Comments: L AKA Restrictions LLE Weight Bearing: Non weight bearing    Mobility  Bed Mobility Overal bed mobility: Needs Assistance Bed Mobility: Supine to Sit     Supine to sit: Mod assist;+2 for physical assistance     General bed mobility comments: pt able to bring R LE off EOB and initiate transfer with UEs but con't to require assist for trunk elevation and to bring hips to EOB for optimal sitting position  Transfers Overall transfer level: Needs assistance Equipment used:  (bed pad; amputee sling for maximove) Transfers: Licensed conveyancer transfers: +2 physical assistance;Max assist   General transfer comment: Wrok on reciprocal scooting backwards during ant-post transfer; Cues to weight shift to [employer] R and L hips for scooting, more difficulty with L lean, likely due to pain from AKA; Ultimately required 2 person assist to slide her backwards into chair  Ambulation/Gait                 Stairs             Wheelchair Mobility    Modified Rankin (Stroke Patients Only)       Balance     Sitting balance-Leahy Scale: Fair                              Cognition Arousal/Alertness: Awake/alert Behavior During Therapy: WFL for tasks assessed/performed Overall Cognitive Status: Within Functional Limits for tasks assessed                      Exercises      General Comments        Pertinent Vitals/Pain Pain Assessment: Faces Faces Pain Scale: Hurts a little bit Pain Descriptors / Indicators: Grimacing Pain Intervention(s): Limited activity within patient's tolerance;Monitored during session;Repositioned    Home Living                      Prior Function            PT Goals (current goals can now be found in the care plan section) Acute Rehab PT Goals Patient Stated Goal: to rehab then home PT Goal Formulation: With patient Time For Goal Achievement: 11/03/15 Potential to Achieve Goals: Fair Progress towards PT goals: Progressing toward goals    Frequency  Min 2X/week    PT Plan Current plan remains appropriate    Co-evaluation             End of Session  Activity Tolerance: Patient tolerated treatment well Patient left: in chair;with call bell/phone within reach;with chair alarm set (Amputee sling under pt)     Time: [id number] PT Time Calculation (min) (ACUTE ONLY): 19 min  Charges:  $Therapeutic Activity: 8-22 mins                    G Codes:      Lindsay Cobb, Lindsay Cobb 10/22/2015, 4:18 PM  [facility] Lindsay Cobb, [place]  Acute Rehabilitation Services Pager [phone] Office [phone]

## 2015-10-23 LAB — BASIC METABOLIC PANEL
ANION GAP: 5 (ref 5–15)
BUN: 6 mg/dL (ref 6–20)
CHLORIDE: 103 mmol/L (ref 101–111)
CO2: 26 mmol/L (ref 22–32)
Calcium: 8.5 mg/dL — ABNORMAL LOW (ref 8.9–10.3)
Creatinine, Ser: 0.98 mg/dL (ref 0.44–1.00)
GFR calc Af Amer: >60 mL/min (ref >60)
GFR, EST NON AFRICAN AMERICAN: 56 mL/min — AB (ref >60)
GLUCOSE: 102 mg/dL — AB (ref 65–99)
POTASSIUM: 3.5 mmol/L (ref 3.5–5.1)
Sodium: 134 mmol/L — ABNORMAL LOW (ref 135–145)

## 2015-10-23 LAB — GLUCOSE, CAPILLARY
GLUCOSE-CAPILLARY: 143 mg/dL — AB (ref 65–99)
GLUCOSE-CAPILLARY: 139 mg/dL — AB (ref 65–99)
Glucose-Capillary: 108 mg/dL — ABNORMAL HIGH (ref 65–99)
Glucose-Capillary: 117 mg/dL — ABNORMAL HIGH (ref 65–99)

## 2015-10-23 NOTE — Progress Notes (Signed)
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 She had been on daptomycin at the SNF. She presented to the hospital with confusion and infected ulcer on the buttock. She was hospitalized for further management.  Past medical history:  Past Medical History  Diagnosis Date  . Diabetes mellitus     takes Januvia,Metformin,and Glipizide daily  . Hyperlipidemia     takes Lovastatin nightly  . Anemia     takes Ferrous Gluconate daily  . Peripheral edema     takes Furosemide daily as needed  . Depression     takes Tofranil daily  . Hypertension     takes Amlodipine,Atenolol,and Benazepril daily  . Arthritis   . Joint pain   . Joint swelling   . Cataracts, bilateral     immature  . Infection of skin of knee     left  . Decubitus ulcer of buttock, unstageable (HCC) 10/2015    Consultants: infectious disease. General surgery.  Procedures: bedside debridement of her decubitus  Antibiotics: Currently on IV Rocephin and oral Flagyl.  Subjective: Patient feels well. Denies any complaints. Patient's husband is at bedside.  Objective: Vital Signs  Filed Vitals:   10/22/15 0534 10/22/15 1350 10/22/15 2100 10/23/15 0543  BP: 148/78 154/65 127/68 160/81  Pulse: 87 88 64 71  Temp: 98.6 F (37 C) 98 F (36.7 C) 98.4 F (36.9 C) 98.2 F (36.8 C)  TempSrc: Oral Oral Oral Oral  Resp: 16 16 16 18   Height:      Weight:      SpO2: 97% 96% 97% 98%    Intake/Output Summary (Last 24 hours) at 10/23/15 0936 Last data filed at 10/23/15 0543  Gross per 24 hour  Intake    611 ml  Output    500 ml  Net    111 ml   Filed Weights   10/14/15 2244 10/16/15 2059  Weight: 93.35 kg (205 lb 12.8 oz) 94.167 kg (207 lb 9.6 oz)    General  appearance: alert, cooperative, appears stated age and no distress Resp: clear to auscultation bilaterally Cardio: regular rate and rhythm, S1, S2 normal, no murmur, click, rub or gallop GI: soft, non-tender; bowel sounds normal; no masses,  no organomegaly Extremities: status post left AKA Neurologic: alert and oriented 3. No focal neurological deficits.  Lab Results:  Basic Metabolic Panel:  Recent Labs Lab 10/17/15 0340 10/19/15 0446 10/22/15 0435 10/23/15 0607  NA 134* 135 136 134*  K 3.4* 3.8 3.4* 3.5  CL 100* 103 103 103  CO2 26 26 26 26   GLUCOSE 112* 120* 88 102*  BUN 20 8 7 6   CREATININE 1.61* 1.13* 1.05* 0.98  CALCIUM 8.0* 8.4* 8.4* 8.5*   CBC:  Recent Labs Lab 10/17/15 0340 10/19/15 0446 10/22/15 0435  WBC 11.8* 9.0 9.8  HGB 7.3* 7.6* 7.7*  HCT 23.5* 24.8* 25.4*  MCV 77.0* 78.5 78.9  PLT 478* 472* 423*   BNP (last 3 results)  Recent Labs  09/26/15 1414  BNP 48.0   CBG:  Recent Labs Lab 10/22/15 0719 10/22/15 1258 10/22/15 1722 10/22/15 2103 10/23/15 0732  GLUCAP 89 87 117* 139* 108*    Recent Results (from the past 240 hour(s))  Wound culture     Status: None   Collection Time: 10/13/15  4:10 PM  Result Value Ref Range Status   Specimen Description WOUND LEFT BUTTOCK  Final   Special Requests NONE  Final   Gram Stain   Final    MODERATE WBC PRESENT,BOTH PMN AND MONONUCLEAR NO SQUAMOUS EPITHELIAL CELLS SEEN FEW GRAM NEGATIVE RODS Performed at [facility]    Culture   Final    ABUNDANT ESCHERICHIA COLI Performed at [facility]    Report Status 10/16/2015 FINAL  Final   Organism ID, Bacteria ESCHERICHIA COLI  Final      Susceptibility   Escherichia coli - MIC*    AMPICILLIN >=32 RESISTANT Resistant     AMPICILLIN/SULBACTAM <=2 SENSITIVE Sensitive     CEFAZOLIN 16 RESISTANT Resistant     CEFEPIME <=1 SENSITIVE Sensitive     CEFTAZIDIME <=1 SENSITIVE Sensitive     CEFTRIAXONE <=1 SENSITIVE Sensitive      CIPROFLOXACIN >=4 RESISTANT Resistant     GENTAMICIN <=1 SENSITIVE Sensitive     IMIPENEM <=0.25 SENSITIVE Sensitive     PIP/TAZO <=4 SENSITIVE Sensitive     TOBRAMYCIN <=1 SENSITIVE Sensitive     TRIMETH/SULFA Value in next row Sensitive      <=20 SENSITIVE(NOTE)    * ABUNDANT ESCHERICHIA COLI  Blood Culture (routine x 2)     Status: None   Collection Time: 10/14/15  5:20 PM  Result Value Ref Range Status   Specimen Description BLOOD RIGHT ARM  Final   Special Requests IN PEDIATRIC BOTTLE 4CC  Final   Culture NO GROWTH 5 DAYS  Final   Report Status 10/19/2015 FINAL  Final  Blood Culture (routine x 2)     Status: None   Collection Time: 10/14/15  5:30 PM  Result Value Ref Range Status   Specimen Description BLOOD RIGHT HAND  Final   Special Requests BOTTLES DRAWN AEROBIC AND ANAEROBIC 2CC  Final   Culture NO GROWTH 5 DAYS  Final   Report Status 10/19/2015 FINAL  Final  Urine culture     Status: None   Collection Time: 10/14/15  8:07 PM  Result Value Ref Range Status   Specimen Description URINE, CLEAN CATCH  Final   Special Requests NONE  Final   Culture 80,000 COLONIES/ml ESCHERICHIA COLI  Final   Report Status 10/16/2015 FINAL  Final   Organism ID, Bacteria ESCHERICHIA COLI  Final      Susceptibility   Escherichia coli - MIC*    AMPICILLIN 4 SENSITIVE Sensitive     CEFAZOLIN <=4 SENSITIVE Sensitive     CEFTRIAXONE <=1 SENSITIVE Sensitive     CIPROFLOXACIN >=4 RESISTANT Resistant     GENTAMICIN <=1 SENSITIVE Sensitive     IMIPENEM <=0.25 SENSITIVE Sensitive     NITROFURANTOIN <=16 SENSITIVE Sensitive     TRIMETH/SULFA <=20 SENSITIVE Sensitive     AMPICILLIN/SULBACTAM <=2 SENSITIVE Sensitive     PIP/TAZO <=4 SENSITIVE Sensitive     * 80,000 COLONIES/ml ESCHERICHIA COLI      Studies/Results: No results found.  Medications:  Scheduled: . atenolol  50 mg Oral Daily  . cefTRIAXone (ROCEPHIN)  IV  1 g Intravenous Q24H  . collagenase   Topical Daily  . ferrous sulfate   325 mg Oral Q breakfast  . heparin  5,000 Units Subcutaneous 3 times per day  . hydrocerin   Topical BID  . imipramine  50 mg Oral TID  . insulin aspart  0-9 Units Subcutaneous TID WC  . lactulose  30 g Oral QODAY  .  loratadine  10 mg Oral Daily  . metroNIDAZOLE  500 mg Oral 3 times per day  . sodium chloride  3 mL Intravenous Q12H   Continuous: . sodium chloride 1,000 mL (10/20/15 2200)   ZOX:WRUEAVWUJ, ipratropium-albuterol, morphine injection, nitroGLYCERIN, ondansetron **OR** ondansetron (ZOFRAN) IV, sodium chloride, sodium phosphate  Assessment/Plan:  Principal Problem:   Stage 4 decubitus ulcer (HCC) Active Problems:   Type II diabetes mellitus (HCC)   Essential hypertension, benign   Obesity   Anxiety   Anemia of chronic disease   Hyponatremia   Constipation   Leukocytosis   S/P AKA (above knee amputation) unilateral (HCC)    Decubitus ulcer of left buttock, stage 4 S/p surgical debridement. Continue pulse lavage/hydrotherapy per general surgery. Cultures grew e coli sensitive to rocephin. Continue same. ID recommends 2 weeks total of rocephin and flagyl, to end on 11/23. Per general surgery, patient will need hydrotherapy for 2 weeks. Social worker to find out if this is possible at her skilled nursing facility. Possibly a candidate for woundvac.  Possible UTI Urine cx with 80K E coli colonies. Will be covered by ceftriaxone.  History of MRSA bacteremia and septic knee, status post left AKA No further need for daptomycin per ID. Dr. [name] evaluated and removed staples and sutures from her left AKA stump.  Type II diabetes mellitus Metformin held due to renal failure. Continue sliding scale insulin. Reasonable control.  Essential hypertension, benign Continue atenolol. Monitor blood pressures closely.  Anemia of chronic disease Had anemia panel last admission. No overt bleed  Hyponatremia; mild Resolved  Constipation Continue lactulose  Dermatitis on  arms Resolved  Status post recent left AKA on 10/23 See above  Toxic encephalopathy Resolved  Urinary retention Continue foley while getting woundcare. Eventual voiding trial. Could also be done at [facility].  Acute kidney injury Likely from infection and urinary retention. Now improved.  DVT Prophylaxis: subcutaneous heparin    Code Status: full code  Family Communication: discussed with the patient and her husband  Disposition Plan: Will need hydrotherapy on a daily basis for 2 weeks. This will determine disposition. Social worker and Sports coach to further investigate. Anticipate discharge tomorrow.    LOS: 9 days   [facility]  [facility] Pager [phone] 10/23/2015, 9:36 AM  If 7PM-7AM, please contact night-coverage at [email or web address], password [facility]

## 2015-10-23 NOTE — Discharge Instructions (Signed)
Continue Hydrotherapy  Dressing Change, three times a day.  Put santyl one wound once daily with dressing change. A dressing is a material placed over wounds. It keeps the wound clean, dry, and protected from further injury. This provides an environment that favors wound healing.  BEFORE YOU BEGIN  Get your supplies together. Things you may need include:  Saline solution.  Flexible gauze dressing.  Medicated cream.  Tape.  Gloves.  Abdominal dressing pads.  Gauze squares.  Plastic bags.  Take pain medicine 30 minutes before the dressing change if you need it.  Take a shower before you do the first dressing change of the day. Use plastic wrap or a plastic bag to prevent the dressing from getting wet. REMOVING YOUR OLD DRESSING   Wash your hands with soap and water. Dry your hands with a clean towel.  Put on your gloves.  Remove any tape.  Carefully remove the old dressing. If the dressing sticks, you may dampen it with warm water to loosen it, or follow your caregiver's specific directions.  Remove any gauze or packing tape that is in your wound.  Take off your gloves.  Put the gloves, tape, gauze, or any packing tape into a plastic bag. CHANGING YOUR DRESSING  Open the supplies.  Take the cap off the saline solution.  Open the gauze package so that the gauze remains on the inside of the package.  Put on your gloves.  Clean your wound as told by your caregiver.  If you have been told to keep your wound dry, follow those instructions.  Your caregiver may tell you to do one or more of the following:  Pick up the gauze. Pour the saline solution over the gauze. Squeeze out the extra saline solution.  Put medicated cream or other medicine on your wound if you have been told to do so.  Put the solution soaked gauze only in your wound, not on the skin around it.  Pack your wound loosely or as told by your caregiver.  Put dry gauze on your wound.  Put  abdominal dressing pads over the dry gauze if your wet gauze soaks through.  Tape the abdominal dressing pads in place so they will not fall off. Do not wrap the tape completely around the affected part (arm, leg, abdomen).  Wrap the dressing pads with a flexible gauze dressing to secure it in place.  Take off your gloves. Put them in the plastic bag with the old dressing. Tie the bag shut and throw it away.  Keep the dressing clean and dry until your next dressing change.  Wash your hands. SEEK MEDICAL CARE IF:  Your skin around the wound looks red.  Your wound feels more tender or sore.  You see pus in the wound.  Your wound smells bad.  You have a fever.  Your skin around the wound has a rash that itches and burns.  You see black or yellow skin in your wound that was not there before.  You feel nauseous, throw up, and feel very tired.   This information is not intended to replace advice given to you by your health care provider. Make sure you discuss any questions you have with your health care provider.   Document Released: 12/30/2004 Document Revised: 02/14/2012 Document Reviewed: 10/04/2011 Elsevier Interactive Patient Education [employer]2016 [employer]vier Inc.

## 2015-10-23 NOTE — Progress Notes (Signed)
Patient ID: Nani SkillernHelen M Bensen, female   DOB: 09/08/1941, 74 y.o.   MRN: [id number]    Subjective: Pt feels well today.  Refused dressing change last night  Objective: Vital signs in last 24 hours: Temp:  [98 F (36.7 C)-98.4 F (36.9 C)] 98.2 F (36.8 C) (11/17 0543) Pulse Rate:  [64-88] 71 (11/17 0543) Resp:  [16-18] 18 (11/17 0543) BP: (127-160)/(65-81) 160/81 mmHg (11/17 0543) SpO2:  [96 %-98 %] 98 % (11/17 0543) Last BM Date: 10/20/15  Intake/Output from previous day: 11/16 0701 - 11/17 0700 In: 2295.2 [P.O.:720; I.V.:425.2; IV Piggyback:1150] Out: 900 [Urine:900] Intake/Output this shift:    PE: Skin: wound is more soupy today since her dressing hasn't been changed since yesterday morning.  But is overall cleaner than Monday.  She still has a significant amount of fibrin and necrotic tissue along the base of the wound.    Lab Results:   Recent Labs  10/22/15 0435  WBC 9.8  HGB 7.7*  HCT 25.4*  PLT 423*   BMET  Recent Labs  10/22/15 0435 10/23/15 0607  NA 136 134*  K 3.4* 3.5  CL 103 103  CO2 26 26  GLUCOSE 88 102*  BUN 7 6  CREATININE 1.05* 0.98  CALCIUM 8.4* 8.5*   PT/INR No results for input(s): LABPROT, INR in the last 72 hours. CMP     Component Value Date/Time   NA 134* 10/23/2015 0607   K 3.5 10/23/2015 0607   CL 103 10/23/2015 0607   CO2 26 10/23/2015 0607   GLUCOSE 102* 10/23/2015 0607   BUN 6 10/23/2015 0607   CREATININE 0.98 10/23/2015 0607   CREATININE 0.91 04/21/2015 1442   CALCIUM 8.5* 10/23/2015 0607   PROT 5.8* 10/15/2015 0420   ALBUMIN 1.6* 10/15/2015 0420   AST 14* 10/15/2015 0420   ALT 12* 10/15/2015 0420   ALKPHOS 84 10/15/2015 0420   BILITOT 1.0 10/15/2015 0420   GFRNONAA 56* 10/23/2015 0607   GFRNONAA 72 09/20/2014 1435   GFRAA >60 10/23/2015 0607   GFRAA 83 09/20/2014 1435   Lipase  No results found for: LIPASE     Studies/Results: No results found.  Anti-infectives: Anti-infectives    Start     Dose/Rate  Route Frequency Ordered Stop   10/21/15 1400  metroNIDAZOLE (FLAGYL) tablet 500 mg     500 mg Oral 3 times per day 10/21/15 0833     10/15/15 1115  cefTRIAXone (ROCEPHIN) 1 g in dextrose 5 % 50 mL IVPB     1 g 100 mL/hr over 30 Minutes Intravenous Every 24 hours 10/15/15 1109     10/15/15 1115  metroNIDAZOLE (FLAGYL) IVPB 500 mg  Status:  Discontinued     500 mg 100 mL/hr over 60 Minutes Intravenous Every 8 hours 10/15/15 1109 10/21/15 0833   10/15/15 0030  piperacillin-tazobactam (ZOSYN) IVPB 3.375 g  Status:  Discontinued     3.375 g 12.5 mL/hr over 240 Minutes Intravenous Every 8 hours 10/14/15 1755 10/15/15 1111   10/14/15 1700  piperacillin-tazobactam (ZOSYN) IVPB 3.375 g     3.375 g 100 mL/hr over 30 Minutes Intravenous  Once 10/14/15 1653 10/14/15 1824   10/14/15 1700  vancomycin (VANCOCIN) IVPB 1000 mg/200 mL premix  Status:  Discontinued     1,000 mg 200 mL/hr over 60 Minutes Intravenous  Once 10/14/15 1653 10/14/15 1656   10/14/15 1700  vancomycin (VANCOCIN) 2,000 mg in sodium chloride 0.9 % 500 mL IVPB     2,000 mg 250  mL/hr over 120 Minutes Intravenous  Once 10/14/15 1657 10/14/15 2114       Assessment/Plan  1. Stage 4 buttock wound -cleaning up slowly with hydrotherapy, but still needs aggressive care.  Will increase dressing changes to TID due to soupiness today. -She will likely need a sNF or LTAC that can continue to do hydrotherapy as she will need this for at least 2 more weeks I suspect. -she will need follow up in the wound clinic for chronic wound management once she is discharged.   LOS: 9 days    Robynn Marcel E 10/23/2015, 9:03 AM Pager: [phone]

## 2015-10-23 NOTE — Care Management Note (Addendum)
Case Management Note  Patient Details  Name: Nani SkillernHelen M Saulters MRN: [id number] Date of Birth: 03/30/1941  Subjective/Objective:                    Action/Plan:   Expected Discharge Date:                  Expected Discharge Plan:  Skilled Nursing Facility  In-House Referral:  Clinical Social Work  Discharge planning Services     Post Acute Care Choice:    Choice offered to:     DME Arranged:    DME Agency:     HH Arranged:    HH Agency:     Status of Service:  In process, will continue to follow  Medicare Important Message Given:    Date Medicare IM Given:    Medicare IM give by:    Date Additional Medicare IM Given:    Additional Medicare Important Message give by:     If discussed at Long Length of Stay Meetings, dates discussed:  10-23-15  Additional Comments: UR updated   [name], Shimika Ames Marie, RN 10/23/2015, 8:34 AM

## 2015-10-23 NOTE — Progress Notes (Signed)
Physical Therapy Wound Treatment Patient Details  Name: Lindsay Cobb MRN: [id number] Date of Birth: 1941/02/02  Today's Date: 10/23/2015 Time: 1132-1207 Time Calculation (min): 35 min  Subjective  Subjective: I'm doing pretty good Patient and Family Stated Goals: Heal wound Date of Onset:  (admitted with pressure ulcer) Prior Treatments: I&D on 11/09  Pain Score:   No pain starting treatment, premedicated. 0/10  Wound Assessment  Pressure Ulcer Unstageable - Full thickness tissue loss in which the base of the ulcer is covered by slough (yellow, tan, gray, green or brown) and/or eschar (tan, brown or black) in the wound bed. (Active)  Dressing Type Gauze (Comment);Moist to dry;Tape dressing;Other (Comment);ABD 10/23/2015 12:13 PM  Dressing Changed;Clean;Dry;Intact 10/23/2015 12:13 PM  Dressing Change Frequency Twice a day 10/23/2015 12:13 PM  State of Healing Early/partial granulation 10/23/2015 12:13 PM  Site / Wound Assessment Painful;Pink;Red;Yellow;Pale;Granulation tissue 10/23/2015 12:13 PM  % Wound base Red or Granulating 45% 10/23/2015 12:13 PM  % Wound base Yellow 55% 10/23/2015 12:13 PM  % Wound base Black 0% 10/23/2015 12:13 PM  % Wound base Other (Comment) 0% 10/23/2015 12:13 PM  Peri-wound Assessment Intact 10/23/2015 12:13 PM  Wound Length (cm) 6.8 cm 10/23/2015 12:13 PM  Wound Width (cm) 4.5 cm 10/23/2015 12:13 PM  Wound Depth (cm) 4 cm 10/23/2015 12:13 PM  Tunneling (cm) 6.7 cm at 11 o'clock 10/23/2015 12:13 PM  Undermining (cm) 3.5 cm from 9:00 to 1:00 10/23/2015 12:13 PM  Margins Unattached edges (unapproximated) 10/23/2015 12:13 PM  Drainage Amount Moderate 10/23/2015 12:13 PM  Drainage Description Purulent;Serosanguineous 10/23/2015 12:13 PM  Treatment Debridement (Selective);Hydrotherapy (Pulse lavage);Packing (Saline gauze);Tape changed;Other (Comment) 10/23/2015 12:13 PM     Santyl applied to wound bed prior to applying dressing.  Hydrotherapy Pulsed  lavage therapy - wound location: Left buttock Pulsed Lavage with Suction (psi): 8 psi (4-12psi) Pulsed Lavage with Suction - Normal Saline Used: 1000 mL Pulsed Lavage Tip: Tip with splash shield Selective Debridement Selective Debridement - Location: Left buttock Selective Debridement - Tools Used: Forceps;Scissors Selective Debridement - Tissue Removed: yellow slough and necrotic tissue   Wound Assessment and Plan  Wound Therapy - Assess/Plan/Recommendations Wound Therapy - Clinical Statement: Located a tract at 11:00 approx 6.7 cm in length. Removed signifcant amount of necrosis at the cepahlic end of the wound. Wound Therapy - Functional Problem List: unable to tolerate prolonged sitting due to pain and risk of progression of decubitus ulcer. Factors Delaying/Impairing Wound Healing: Diabetes Mellitus;Immobility;Multiple medical problems;Polypharmacy Hydrotherapy Plan: Debridement;Dressing change;Patient/family education;Pulsatile lavage with suction Wound Therapy - Frequency: 6X / week Wound Therapy - Current Recommendations: PT Wound Therapy - Follow Up Recommendations:  (per surgery) Wound Plan: see above  Wound Therapy Goals- Improve the function of patient's integumentary system by progressing the wound(s) through the phases of wound healing (inflammation - proliferation - remodeling) by: Decrease Necrotic Tissue to: 50 Decrease Necrotic Tissue - Progress: Progressing toward goal Increase Granulation Tissue to: 50 Improve Drainage Characteristics: Min Improve Drainage Characteristics - Progress: Progressing toward goal  Goals will be updated until maximal potential achieved or discharge criteria met.  Discharge criteria: when goals achieved, discharge from hospital, MD decision/surgical intervention, no progress towards goals, refusal/missing three consecutive treatments without notification or medical reason.  GP     Candie Mile S 10/23/2015, 12:19 PM  Elayne Snare, [place]

## 2015-10-24 ENCOUNTER — Inpatient Hospital Stay
Admission: RE | Admit: 2015-10-24 | Discharge: 2015-10-30 | Disposition: A | Discharge status: Other Acute Inpatient Hospital | Payer: Commercial Managed Care - HMO | Source: Ambulatory Visit | Attending: Internal Medicine | Admitting: Internal Medicine

## 2015-10-24 DIAGNOSIS — Z95828 Presence of other vascular implants and grafts: Principal | ICD-10-CM

## 2015-10-24 LAB — GLUCOSE, CAPILLARY
GLUCOSE-CAPILLARY: 118 mg/dL — AB (ref 65–99)
GLUCOSE-CAPILLARY: 123 mg/dL — AB (ref 65–99)

## 2015-10-24 MED ORDER — LACTULOSE 10 GM/15ML PO SOLN: 20.0000 g | Freq: Every day | ORAL | Status: DC

## 2015-10-24 MED ORDER — METRONIDAZOLE 500 MG PO TABS: 500.0000 mg | ORAL_TABLET | Freq: Three times a day (TID) | ORAL | Status: DC

## 2015-10-24 MED ORDER — COLLAGENASE 250 UNIT/GM EX OINT: TOPICAL_OINTMENT | Freq: Every day | CUTANEOUS | Status: DC

## 2015-10-24 MED ORDER — HEPARIN SOD (PORK) LOCK FLUSH 100 UNIT/ML IV SOLN
250.0000 [IU] | INTRAVENOUS | Status: AC | PRN
Start: 2015-10-24 — End: 2015-10-24
  Administered 2015-10-24: 250 [IU]

## 2015-10-24 MED ORDER — OXYCODONE HCL 5 MG PO TABS: 5.0000 mg | ORAL_TABLET | ORAL | Status: DC | PRN

## 2015-10-24 MED ORDER — FLEET ENEMA 7-19 GM/118ML RE ENEM: 1.0000 | ENEMA | Freq: Once | RECTAL | Status: DC | PRN

## 2015-10-24 MED ORDER — HYDROCERIN EX CREA: 1.0000 "application " | TOPICAL_CREAM | Freq: Two times a day (BID) | CUTANEOUS | Status: DC

## 2015-10-24 MED ORDER — DEXTROSE 5 % IV SOLN: 1.0000 g | INTRAVENOUS | Status: DC

## 2015-10-24 NOTE — Progress Notes (Signed)
Physical Therapy Wound Treatment Patient Details  Name: GEET HOSKING MRN: [id number] Date of Birth: Feb 18, 1941  Today's Date: 10/24/2015 Time: 1696-7893 Time Calculation (min): 34 min  Subjective  Subjective: I slept okay last night, my back side doesn't hurt anymore. Patient and Family Stated Goals: Heal wound Date of Onset:  (admitted with pressure ulcer) Prior Treatments: I&D on 11/09  Pain Score:   0/10 no pain. Premedicated  Wound Assessment  Pressure Ulcer Unstageable - Full thickness tissue loss in which the base of the ulcer is covered by slough (yellow, tan, gray, green or brown) and/or eschar (tan, brown or black) in the wound bed. (Active)  Dressing Type Gauze (Comment);Moist to dry;Tape dressing;Other (Comment);ABD 10/24/2015 10:30 AM  Dressing Changed;Clean;Dry;Intact 10/24/2015 10:30 AM  Dressing Change Frequency Twice a day 10/24/2015 10:30 AM  State of Healing Early/partial granulation 10/24/2015 10:30 AM  Site / Wound Assessment Painful;Pink;Red;Yellow;Pale;Granulation tissue 10/24/2015 10:30 AM  % Wound base Red or Granulating 45% 10/24/2015 10:30 AM  % Wound base Yellow 55% 10/24/2015 10:30 AM  % Wound base Black 0% 10/24/2015 10:30 AM  % Wound base Other (Comment) 0% 10/24/2015 10:30 AM  Peri-wound Assessment Intact 10/24/2015 10:30 AM  Wound Length (cm) 6.8 cm 10/23/2015 12:13 PM  Wound Width (cm) 4.5 cm 10/23/2015 12:13 PM  Wound Depth (cm) 4 cm 10/23/2015 12:13 PM  Tunneling (cm) 6.7 cm at 11 o'clock 10/23/2015 12:13 PM  Undermining (cm) 3.5 cm from 9:00 to 1:00 10/23/2015 12:13 PM  Margins Unattached edges (unapproximated) 10/24/2015 10:30 AM  Drainage Amount Moderate 10/24/2015 10:30 AM  Drainage Description Purulent;Serosanguineous 10/24/2015 10:30 AM  Treatment Debridement (Selective);Hydrotherapy (Pulse lavage);Packing (Saline gauze);Tape changed;Other (Comment) 10/24/2015 10:30 AM     Santyl applied to wound bed prior to applying  dressing.  Hydrotherapy Pulsed lavage therapy - wound location: Left buttock Pulsed Lavage with Suction (psi): 8 psi (4-12psi) Pulsed Lavage with Suction - Normal Saline Used: 1000 mL Pulsed Lavage Tip: Tip with splash shield Selective Debridement Selective Debridement - Location: Left buttock Selective Debridement - Tools Used: Forceps;Scissors Selective Debridement - Tissue Removed: yellow slough and necrotic tissue   Wound Assessment and Plan  Wound Therapy - Assess/Plan/Recommendations Wound Therapy - Clinical Statement: More non-viable tissue removed from deep within wound bed, mostly towards cephalic end of wound. Continue with selective debridement, hydrotherapy, and santyl application to promote healthy granulatory growth. Wound Therapy - Functional Problem List: unable to tolerate prolonged sitting due to pain and risk of progression of decubitus ulcer. Factors Delaying/Impairing Wound Healing: Diabetes Mellitus;Immobility;Multiple medical problems;Polypharmacy Hydrotherapy Plan: Debridement;Dressing change;Patient/family education;Pulsatile lavage with suction Wound Therapy - Frequency: 6X / week Wound Therapy - Current Recommendations: PT Wound Therapy - Follow Up Recommendations:  (per surgery) Wound Plan: see above  Wound Therapy Goals- Improve the function of patient's integumentary system by progressing the wound(s) through the phases of wound healing (inflammation - proliferation - remodeling) by: Decrease Necrotic Tissue to: 50 Decrease Necrotic Tissue - Progress: Progressing toward goal Increase Granulation Tissue to: 50 Increase Granulation Tissue - Progress: Progressing toward goal Improve Drainage Characteristics: Min Improve Drainage Characteristics - Progress: Progressing toward goal  Goals will be updated until maximal potential achieved or discharge criteria met.  Discharge criteria: when goals achieved, discharge from hospital, MD decision/surgical  intervention, no progress towards goals, refusal/missing three consecutive treatments without notification or medical reason.  GP     Candie Mile S 10/24/2015, 10:36 AM  Elayne Snare, [place]

## 2015-10-24 NOTE — Discharge Summary (Signed)
[facility]  Physician Discharge Summary   Patient ID: Lindsay Cobb MRN: [id number] DOB/AGE: 73/04/1941 73 y.o.  Admit date: 10/14/2015 Discharge date: 10/24/2015  PCP: [name], MD  DISCHARGE DIAGNOSES:  Principal Problem:   Stage 4 decubitus ulcer (HCC) Active Problems:   Type II diabetes mellitus (HCC)   Essential hypertension, benign   Obesity   Anxiety   Anemia of chronic disease   Hyponatremia   Constipation   Leukocytosis   S/P AKA (above knee amputation) unilateral (HCC)   RECOMMENDATIONS FOR OUTPATIENT FOLLOW UP: 1. CBC and basic metabolic panel next week 2. She needs hydrotherapy to her sacral decubitus wound every day for 2 weeks. 3. Please do voiding trial next week and attempt to take out Foley catheter 4. Please remove PICC line on November 24 5. 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 She had been on daptomycin at the SNF. She presented to the hospital with confusion and infected ulcer on the buttock. She was hospitalized for further management.  Consultants: infectious disease. General surgery.  Procedures: bedside debridement of her decubitus   HOSPITAL COURSE:   Decubitus ulcer of left buttock, stage 4 She is s/p surgical debridement. Continue hydrotherapy per general surgery. Cultures grew e coli sensitive to rocephin. ID recommends 2 weeks total of rocephin and flagyl, to end on 11/23. Per general surgery, patient will need hydrotherapy for 2 weeks. This can be provided close to her SNF. After hydrotherapy, consideration may be given to a wound VAC. She will also  need follow-up at the wound care center.  Possible UTI Urine cx with 80K E coli colonies. Will be covered by ceftriaxone.  History of MRSA bacteremia and septic knee, status post left AKA No further need for daptomycin per ID. Dr. [name] evaluated and removed staples and sutures from her left AKA stump. Continue PT.  Type II diabetes mellitus Metformin was held due to renal failure. Renal function is improved. May resume her oral medications. Monitor blood sugars closely.   Essential hypertension, benign Blood pressure is reasonably well controlled. She will continue her home medication regimen.   Anemia of chronic disease Had anemia panel last admission. No overt bleed. Check CBC next week.  Hyponatremia; mild Resolved  Constipation Continue lactulose  Dermatitis on arms Resolved  Status post recent left AKA on 10/23 See above  Toxic encephalopathy Resolved  Urinary retention Continue foley while getting woundcare. Voiding trial should be done at the SNF in 1 week.  Acute kidney injury Likely from infection and urinary retention. Now improved and back to baseline.  Overall improved. Patient feels much better. She is tolerating her diet. She may return to her SNF as discussed above.   PERTINENT LABS:  The results of significant diagnostics from this hospitalization (including imaging, microbiology, ancillary and laboratory) are listed below for reference.    Microbiology: Recent Results (from the past 240 hour(s))  Blood Culture (routine x 2)     Status: None   Collection Time: 10/14/15  5:20 PM  Result Value Ref Range Status   Specimen Description BLOOD RIGHT ARM  Final   Special Requests IN PEDIATRIC BOTTLE 4CC  Final   Culture NO GROWTH 5 DAYS  Final   Report Status 10/19/2015 FINAL  Final  Blood Culture (routine x 2)     Status: None   Collection Time: 10/14/15  5:30 PM  Result Value Ref Range Status   Specimen Description BLOOD RIGHT HAND  Final   Special  Requests BOTTLES DRAWN AEROBIC AND ANAEROBIC 2CC  Final   Culture NO GROWTH 5 DAYS  Final   Report Status 10/19/2015 FINAL  Final  Urine culture     Status: None   Collection Time: 10/14/15  8:07 PM  Result Value Ref Range Status   Specimen Description URINE, CLEAN CATCH  Final   Special Requests NONE  Final   Culture 80,000 COLONIES/ml ESCHERICHIA COLI  Final   Report Status 10/16/2015 FINAL  Final   Organism ID, Bacteria ESCHERICHIA COLI  Final      Susceptibility   Escherichia coli - MIC*    AMPICILLIN 4 SENSITIVE Sensitive     CEFAZOLIN <=4 SENSITIVE Sensitive     CEFTRIAXONE <=1 SENSITIVE Sensitive     CIPROFLOXACIN >=4 RESISTANT Resistant     GENTAMICIN <=1 SENSITIVE Sensitive     IMIPENEM <=0.25 SENSITIVE Sensitive     NITROFURANTOIN <=16 SENSITIVE Sensitive     TRIMETH/SULFA <=20 SENSITIVE Sensitive     AMPICILLIN/SULBACTAM <=2 SENSITIVE Sensitive     PIP/TAZO <=4 SENSITIVE Sensitive     * 80,000 COLONIES/ml ESCHERICHIA COLI     Labs: Basic Metabolic Panel:  Recent Labs Lab 10/19/15 0446 10/22/15 0435 10/23/15 0607  NA 135 136 134*  K 3.8 3.4* 3.5  CL 103 103 103  CO2 GLUCOSE 120* 88 102*  BUN CREATININE 1.13* 1.05* 0.98  CALCIUM 8.4* 8.4* 8.5*   CBC:  Recent Labs Lab 10/19/15 0446 10/22/15 0435  WBC 9.0 9.8  HGB 7.6* 7.7*  HCT 24.8* 25.4*  MCV 78.5 78.9  PLT 472* 423*   CBG:  Recent Labs Lab 10/23/15 0732 10/23/15 1309 10/23/15 1725 10/23/15 2150 10/24/15 0803  GLUCAP 108* 117* 143* 139* 118*     IMAGING STUDIES US Renal  09/29/2015  CLINICAL DATA:  Acute renal injury. EXAM: RENAL / URINARY TRACT ULTRASOUND COMPLETE COMPARISON:  None. FINDINGS: Extremely limited exam due to body habitus. Right Kidney: Length: 9.8 cm.  No definite hydronephrosis. Left Kidney: Length: 9.5 cm.  No definite hydronephrosis. Bladder: Not visualized. IMPRESSION: Extremely limited examination due to body habitus. No definite hydronephrosis.  Electronically Signed   By: Annia Belt M.D.   On: 09/29/2015 18:49   Mr Knee Left  Wo Contrast  09/27/2015  CLINICAL DATA:  Status post partial left patellectomy 07/11/2015 due to a fracture. The patient's surgical wound subsequently became infected and dehisced. Status post irrigation and debridement. Now with staphylococcal bacteremia. EXAM: MRI OF THE LEFT KNEE WITHOUT CONTRAST TECHNIQUE: Multiplanar, multisequence MR imaging of the knee was performed. No intravenous contrast was administered. COMPARISON:  Plain films left knee 07/04/2015. FINDINGS: Surgical wound is seen anteriorly off the inferior pole the patella. There is diffuse and intense subcutaneous edema about the knee. All imaged musculature demonstrates marked atrophy. MENISCI Medial meniscus:  Intact. Lateral meniscus: There is a horizontal tear in the posterior horn of the lateral meniscus reaching the meniscal undersurface. The body of the lateral meniscus is extruded peripherally. LIGAMENTS Cruciates:  Intact. Collaterals:  Intact. CARTILAGE Patellofemoral: Extensive hyaline cartilage loss is present diffusely. Medial:  Thinned throughout. Lateral:  Thinned throughout.  Joint: A small amount of joint fluid is present. Synovium about the joint appears markedly thickened. Intense edema is seen in Hoffa's fat throughout. Popliteal Fossa:  No Baker's cyst. Extensor Mechanism: The patient is status post inferior patellectomy and reconstruction of the patellar tendon. The extensor mechanism appears intact. Bones: Marrow edema is seen throughout the patella and in the medial and lateral femoral condyles and tibial plateaus. The appearance is highly suspicious for osteomyelitis. IMPRESSION: Intense subcutaneous edema and synovitis about the joint most consistent with infection. Marrow edema throughout the patella and in the tibia and femur is most consistent with osteomyelitis. No abscess is identified. Horizontal tear posterior horn lateral meniscus.  Status post inferior patellectomy and reconstruction of patellar tendon. Marked atrophy of all musculature about the knee. Electronically Signed   By: Drusilla Kanner M.D.   On: 09/27/2015 08:22   Dg Chest Port 1 View  10/14/2015  CLINICAL DATA:  Sepsis. EXAM: PORTABLE CHEST 1 VIEW COMPARISON:  09/26/2015 FINDINGS: The heart is enlarged but stable. A left PICC line is in place. The right PICC line has been removed. Low lung volumes with streaky bibasilar atelectasis and basilar scarring changes. Stable eventration of the right hemidiaphragm. The bony structures are intact. IMPRESSION: Low lung volumes with vascular crowding and bibasilar atelectasis. Left PICC line in good position. Electronically Signed   By: Rudie Meyer M.D.   On: 10/14/2015 18:24   Dg Chest Port 1 View  09/26/2015  CLINICAL DATA:  Shortness of breath. EXAM: PORTABLE CHEST 1 VIEW COMPARISON:  09/21/2015 FINDINGS: The cardiac silhouette, mediastinal and hilar contours are within normal limits and stable. Low lung volumes with vascular crowding and bibasilar atelectasis. Could not exclude a right lower lobe infiltrate. No definite pleural effusions. The right PICC line is stable. IMPRESSION: Low lung volumes with vascular crowding and bibasilar atelectasis. Could not exclude a right basilar infiltrate. Electronically Signed   By: Rudie Meyer M.D.   On: 09/26/2015 14:54   Dg Abd 2 Views  10/14/2015  CLINICAL DATA:  Constipation.  No abdominal pain EXAM: ABDOMEN - 2 VIEW COMPARISON:  None. FINDINGS: No abnormal stool retention. Bowel gas pattern is nonobstructive. No concerning intra-abdominal mass effect or calcification. Lung bases are clear. There is a left-sided central line with tip near the upper cavoatrial junction. IMPRESSION: No abnormal stool retention.  Nonobstructive bowel gas pattern. Electronically Signed   By: Marnee Spring M.D.   On: 10/14/2015 23:35    DISCHARGE EXAMINATION: Filed Vitals:   10/23/15 1015 10/23/15  1400 10/23/15 2152 10/24/15 0519  BP: 147/65 147/65 147/61 142/75  Pulse: 63 57 61 70  Temp: 97.7 F (36.5 C) 97.7 F (36.5 C) 97.7 F (36.5 C) 98.4 F (36.9 C)  TempSrc: Oral Oral Oral Oral  Resp: 18 18 18 19   Height:      Weight:      SpO2: 98% 97% 98% 95%   General appearance: alert, cooperative, appears stated age and no distress Resp: clear to auscultation bilaterally Cardio: regular rate and rhythm, S1, S2 normal, no murmur, click, rub or gallop GI: soft, non-tender; bowel sounds normal; no masses,  no organomegaly   DISPOSITION: SNF  Discharge Instructions    Call MD for:  difficulty breathing, headache or visual disturbances    Complete by:  As directed      Call MD for:  extreme fatigue    Complete by:  As directed      Call MD for:  persistant dizziness or  light-headedness    Complete by:  As directed      Call MD for:  persistant nausea and vomiting    Complete by:  As directed      Call MD for:  severe uncontrolled pain    Complete by:  As directed      Call MD for:  temperature >100.4    Complete by:  As directed      Diet Carb Modified    Complete by:  As directed      Discharge instructions    Complete by:  As directed   Please do voiding trial in 1 week. Leave foley in until then. Please note that the last date for antibiotics is 10/29/15. Stop after that. May remove PICC line after 10/29/15.  You were cared for by a hospitalist during your hospital stay. If you have any questions about your discharge medications or the care you received while you were in the hospital after you are discharged, you can call the unit and asked to speak with the hospitalist on call if the hospitalist that took care of you is not available. Once you are discharged, your primary care physician will handle any further medical issues. Please note that NO REFILLS for any discharge medications will be authorized once you are discharged, as it is imperative that you return to your  primary care physician (or establish a relationship with a primary care physician if you do not have one) for your aftercare needs so that they can reassess your need for medications and monitor your lab values. If you do not have a primary care physician, you can call [phone] for a physician referral.     Increase activity slowly    Complete by:  As directed            ALLERGIES: No Known Allergies   Current Discharge Medication List    START taking these medications   Details  cefTRIAXone 1 g in dextrose 5 % 50 mL Inject 1 g into the vein daily. To be given till 10/29/15.    collagenase (SANTYL) ointment Apply topically daily. Qty: 15 g, Refills: 0    hydrocerin (EUCERIN) CREA Apply 1 application topically 2 (two) times daily. Refills: 0    metroNIDAZOLE (FLAGYL) 500 MG tablet Take 1 tablet (500 mg total) by mouth every 8 (eight) hours. Till 10/29/15.    sodium phosphate (FLEET) 7-19 GM/118ML ENEM Place 133 mLs (1 enema total) rectally once as needed for severe constipation. Refills: 0      CONTINUE these medications which have CHANGED   Details  lactulose (CHRONULAC) 10 GM/15ML solution Take 30 mLs (20 g total) by mouth daily. Qty: 240 mL, Refills: 0    oxyCODONE (OXY IR/ROXICODONE) 5 MG immediate release tablet Take 1-3 tablets (5-15 mg total) by mouth every 4 (four) hours as needed (5mg  for mild pain 10mg  for moderate pain and 15mg  for severe pain). Qty: 30 tablet, Refills: 0      CONTINUE these medications which have NOT CHANGED   Details  ferrous sulfate 325 (65 FE) MG tablet Take 325 mg by mouth daily with breakfast.    furosemide (LASIX) 20 MG tablet Take 20 mg by mouth daily.    amLODipine (NORVASC) 10 MG tablet TAKE 1 TABLET EVERY DAY Qty: 90 tablet, Refills: 1    aspirin EC 325 MG tablet Take 1 tablet (325 mg total) by mouth daily. Qty: 84 tablet, Refills: 0    atenolol (TENORMIN) 50 MG  tablet Take 1 tablet (50 mg total) by mouth daily. Qty: 90 tablet,  Refills: 1    benazepril (LOTENSIN) 20 MG tablet TAKE 1 TABLET EVERY DAY Qty: 90 tablet, Refills: 1    glucose blood (ACCU-CHEK AVIVA) test strip Use to monitor FSBS 1x daily. Dx: E11.9 Qty: 50 each, Refills: 6    imipramine (TOFRANIL) 50 MG tablet Take 1 tablet (50 mg total) by mouth 3 (three) times daily. Qty: 270 tablet, Refills: 1    JANUVIA 25 MG tablet TAKE ONE TABLET BY MOUTH ONCE DAILY Qty: 30 tablet, Refills: 3    lovastatin (MEVACOR) 10 MG tablet Take 1 tablet (10 mg total) by mouth at bedtime. Qty: 90 tablet, Refills: 2    metFORMIN (GLUCOPHAGE) 1000 MG tablet TAKE 1 TABLET TWICE DAILY WITH A MEAL Qty: 180 tablet, Refills: 1    potassium chloride SA (K-DUR,KLOR-CON) 20 MEQ tablet Take 2 tablets (40 mEq total) by mouth 2 (two) times daily. Qty: 10 tablet, Refills: 0      STOP taking these medications     ciprofloxacin (CIPRO) 500 MG tablet      albuterol (PROVENTIL HFA;VENTOLIN HFA) 108 (90 BASE) MCG/ACT inhaler      ipratropium-albuterol (DUONEB) 0.5-2.5 (3) MG/3ML SOLN      nitroGLYCERIN (NITROSTAT) 0.4 MG SL tablet      DAPTOmycin 900 mg in sodium chloride 0.9 % 100 mL          TOTAL DISCHARGE TIME: 35 minutes  [facility]  [facility] Pager [phone]  10/24/2015, 11:28 AM

## 2015-10-24 NOTE — Clinical Social Work Note (Signed)
Clinical Social Worker facilitated patient discharge including contacting patient family and facility to confirm patient discharge plans.  Clinical information faxed to facility and family agreeable with plan.  CSW arranged ambulance transport via [facility] to [facility].  RN to call report prior to discharge.  Clinical Social Worker will sign off for now as social work intervention is no longer needed. Please consult us again if new need arises.  Derenda FennelBashira London Tarnowski, MSW, LCSWA [phone] 10/24/2015 12:43 PM

## 2015-10-24 NOTE — Progress Notes (Signed)
Pt is getting discharged to [facility] this evening. Alert with confusion. No concerns to report at this time

## 2015-10-24 NOTE — Progress Notes (Signed)
[facility] Pt left unit via ambulance at 1610hrs

## 2015-10-25 ENCOUNTER — Non-Acute Institutional Stay (SKILLED_NURSING_FACILITY): Payer: Commercial Managed Care - HMO | Admitting: Internal Medicine

## 2015-10-25 ENCOUNTER — Encounter (HOSPITAL_COMMUNITY)
Admission: RE | Admit: 2015-10-25 | Discharge: 2015-10-25 | Disposition: A | Discharge status: Home / Self Care | Payer: Commercial Managed Care - HMO | Source: Skilled Nursing Facility | Attending: Internal Medicine | Admitting: Internal Medicine

## 2015-10-25 DIAGNOSIS — L03317 Cellulitis of buttock: Secondary | ICD-10-CM | POA: Diagnosis not present

## 2015-10-25 DIAGNOSIS — L89324 Pressure ulcer of left buttock, stage 4: Secondary | ICD-10-CM | POA: Diagnosis not present

## 2015-10-25 DIAGNOSIS — S78112S Complete traumatic amputation at level between left hip and knee, sequela: Secondary | ICD-10-CM

## 2015-10-25 DIAGNOSIS — S88012S Complete traumatic amputation at knee level, left lower leg, sequela: Secondary | ICD-10-CM | POA: Diagnosis not present

## 2015-10-25 DIAGNOSIS — E0821 Diabetes mellitus due to underlying condition with diabetic nephropathy: Secondary | ICD-10-CM | POA: Diagnosis not present

## 2015-10-25 LAB — C DIFFICILE QUICK SCREEN W PCR REFLEX
C Diff antigen: NEGATIVE
C Diff interpretation: NEGATIVE
C Diff toxin: NEGATIVE

## 2015-10-25 LAB — CREATININE, URINE, RANDOM: Creatinine, Urine: 69.36 mg/dL

## 2015-10-25 LAB — PROTEIN, URINE, RANDOM: Total Protein, Urine: 41 mg/dL

## 2015-10-25 NOTE — Progress Notes (Signed)
Patient ID: Lindsay Cobb, female   DOB: [birth year], 74 y.o.   MRN: [id number]  Facility; [facility] Chief complaint; admission to SNF post admit to [facility] from 11/8 to 10/24/2015   History; this patient is now being readmitted to this nursing home for the third time. She had initially been admitted here after a stay at [facility] from 10/5 through 09/16/2015. At that point she had had a wound infection after suffering a patella fracture in July of this year. Cultures of that left knee grew MRSA. She came to [place] with a wound VAC on the left knee and on IV vancomycin. She was admitted back to the hospital from 10/16-11/2/16 when she developed a high fever. 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 Her postoperative blood cultures were still positive for MRSA and she came back to [place] on Cubicin for 8 weeks. Her TEE did not show any vegetations.  The patient's hospitalization this time was prompted by a high ESR and C-reactive protein being faxed to infectious disease. I think the concern was this that she was still actively septic related to the previous infection in her left knee. What she actually had was an unstageable wound over her left gluteal area. This underwent an I&D by general surgery cultures grew Escherichia coli and she is now on 2 weeks of Rocephin and Flagyl to and on 11/23. Per general surgery hydrotherapy will be required daily although this cannot be provided here. See no imaging studies of the underlying pelvis. The unstageable wound on her left gluteal area was actually a known phenomenon that I was getting ready to debridement in the facility. The patient had actually come year initially 10/12 with wound on her left buttock that was unstageable. When I did her readmission after the left above-knee amputation on 11/6 I listed her as having  superficial pressure areas on both buttock's and the right scapula although this was actually in her and she was noted to have a  unstageable area over the left gluteal. It was noted that this was potentially infected and would need debridement. The patient was on daptomycin at that time. The correct interpretation is that this wound was significant when she first came here and has deteriorated over the course of her stay here and the hospitalizations.  Patient was also felt on this admission to have a possible UTI with 80,000 colonies of Escherichia coli. This is covered by the Rocephin that she is on for the Escherichia coli in her wound. She was not felt to have need further need for daptomycin. She was felt to have renal insufficiency and anemia of chronic disease. Her admission creatinine was 1.5 to increasing to a peak of 1.99 but discharged with a creatinine of 0.98  BMP Latest Ref Rng 10/23/2015 10/22/2015 10/19/2015  Glucose 65 - 99 mg/dL 102(H) 88 120(H)  BUN 6 - 20 mg/dL $Remove'6 7 8  'zRtntDR$ Creatinine 0.44 - 1.00 mg/dL 0.98 1.05(H) 1.13(H)  Sodium 135 - 145 mmol/L 134(L) 136 135  Potassium 3.5 - 5.1 mmol/L 3.5 3.4(L) 3.8  Chloride 101 - 111 mmol/L 103 103 103  CO2 22 - 32 mmol/L $RemoveB'26 26 26  'zRIJNKiy$ Calcium 8.9 - 10.3 mg/dL 8.5(L) 8.4(L) 8.4(L)    CBC Latest Ref Rng 10/22/2015 10/19/2015 10/17/2015  WBC 4.0 - 10.5 K/uL 9.8 9.0 11.8(H)  Hemoglobin 12.0 - 15.0 g/dL 7.7(L) 7.6(L) 7.3(L)  Hematocrit 36.0 - 46.0 % 25.4(L) 24.8(L) 23.5(L)  Platelets 150 - 400 K/uL 423(H) 472(H) 478(H)  Past Medical History  Diagnosis Date  . Diabetes mellitus     takes Januvia,Metformin,and Glipizide daily  . Hyperlipidemia     takes Lovastatin nightly  . Anemia     takes Ferrous Gluconate daily  . Peripheral edema     takes Furosemide daily as needed  . Depression     takes Tofranil daily  . Hypertension     takes Amlodipine,Atenolol,and Benazepril daily  . Arthritis   . Joint pain   . Joint swelling   . Cataracts, bilateral     immature  . Infection of skin of knee     left  . Decubitus ulcer of buttock, unstageable ([place]) 10/2015    Past Surgical History  Procedure Laterality Date  . Breast biopsy Left   . Colonoscopy    . Esophagogastroduodenoscopy    . Ercp    . Patellectomy Left 07/11/2015    Procedure: Left Partial PATELLECTOMY;  Surgeon: Leandrew Koyanagi, MD;  Location: [place];  Service: Orthopedics;  Laterality: Left;  . Irrigation and debridement knee Left 08/15/2015    Procedure: IRRIGATION AND DEBRIDEMENT LEFT KNEE;  Surgeon: Leandrew Koyanagi, MD;  Location: [place];  Service: Orthopedics;  Laterality: Left;  . Patellectomy Left 08/15/2015    Procedure: Revision PATELLECTOMY;  Surgeon: Leandrew Koyanagi, MD;  Location: [place];  Service: Orthopedics;  Laterality: Left;  . I&d extremity Left 08/18/2015    Procedure: IRRIGATION AND DEBRIDEMENT EXTREMITY;  Surgeon: Leandrew Koyanagi, MD;  Location: [place];  Service: Orthopedics;  Laterality: Left;  Marland Kitchen Minor application of wound vac  08/18/2015    Procedure: MINOR APPLICATION OF WOUND VAC;  Surgeon: Leandrew Koyanagi, MD;  Location: [place];  Service: Orthopedics;;  . Irrigation and debridement knee Left 08/20/2015    Procedure: IRRIGATION AND DEBRIDEMENT KNEE WITH PLACEMENT OF INTEGRA;  Surgeon: Leandrew Koyanagi, MD;  Location: [place];  Service: Orthopedics;  Laterality: Left;  . I&d extremity Left 09/10/2015    Procedure: LEFT KNEE SPLIT THICKNESS SKIN GRAFT, IRRIGATION AND DEBRIDEMENT, WOUND VAC;  Surgeon: Leandrew Koyanagi, MD;  Location: [place];  Service: Orthopedics;  Laterality: Left;  . Skin split graft Left 09/10/2015    Procedure: SKIN GRAFT SPLIT THICKNESS;  Surgeon: Leandrew Koyanagi, MD;  Location: [place];  Service: Orthopedics;  Laterality: Left;  . Application of wound vac Left 09/10/2015    Procedure: APPLICATION OF WOUND VAC;  Surgeon: Leandrew Koyanagi, MD;  Location: [place];  Service: Orthopedics;  Laterality: Left;  . I&d extremity Left 09/12/2015    Procedure: IRRIGATION AND DEBRIDEMENT LEFT KNEE WITH PLACEMENT OF WOUND VAC;  Surgeon: Leandrew Koyanagi, MD;  Location: [place];  Service: Orthopedics;  Laterality:  Left;  . Amputation Left 09/28/2015    Procedure: AMPUTATION ABOVE KNEE;  Surgeon: Newt Minion, MD;  Location: [place];  Service: Orthopedics;  Laterality: Left;  . Tee without cardioversion N/A 10/02/2015    Procedure: TRANSESOPHAGEAL ECHOCARDIOGRAM (TEE);  Surgeon: Skeet Latch, MD;  Location[facility] ENDOSCOPY;  Service: Cardiovascular;  Laterality: N/A;   Current Outpatient Prescriptions on File Prior to Visit  Medication Sig Dispense Refill  . amLODipine (NORVASC) 10 MG tablet TAKE 1 TABLET EVERY DAY 90 tablet 1  . aspirin EC 325 MG tablet Take 1 tablet (325 mg total) by mouth daily. 84 tablet 0  . atenolol (TENORMIN) 50 MG tablet Take 1 tablet (50 mg total) by mouth daily. 90 tablet 1  . benazepril (LOTENSIN) 20  MG tablet TAKE 1 TABLET EVERY DAY 90 tablet 1  . cefTRIAXone 1 g in dextrose 5 % 50 mL Inject 1 g into the vein daily. To be given till 10/29/15.    . collagenase (SANTYL) ointment Apply topically daily. 15 g 0  . ferrous sulfate 325 (65 FE) MG tablet Take 325 mg by mouth daily with breakfast.    . furosemide (LASIX) 20 MG tablet Take 20 mg by mouth daily.    Marland Kitchen glucose blood (ACCU-CHEK AVIVA) test strip Use to monitor FSBS 1x daily. Dx: E11.9 50 each 6  . hydrocerin (EUCERIN) CREA Apply 1 application topically 2 (two) times daily.  0  . imipramine (TOFRANIL) 50 MG tablet Take 1 tablet (50 mg total) by mouth 3 (three) times daily. 270 tablet 1  . JANUVIA 25 MG tablet TAKE ONE TABLET BY MOUTH ONCE DAILY 30 tablet 3  . lactulose (CHRONULAC) 10 GM/15ML solution Take 30 mLs (20 g total) by mouth daily. 240 mL 0  . lovastatin (MEVACOR) 10 MG tablet Take 1 tablet (10 mg total) by mouth at bedtime. (Patient not taking: Reported on 10/14/2015) 90 tablet 2  . metFORMIN (GLUCOPHAGE) 1000 MG tablet TAKE 1 TABLET TWICE DAILY WITH A MEAL 180 tablet 1  . metroNIDAZOLE (FLAGYL) 500 MG tablet Take 1 tablet (500 mg total) by mouth every 8 (eight) hours. Till 10/29/15.    Marland Kitchen oxyCODONE (OXY  IR/ROXICODONE) 5 MG immediate release tablet Take 1-3 tablets (5-15 mg total) by mouth every 4 (four) hours as needed ($RemoveBe'5mg'fKBnhmMbw$  for mild pain $RemoveB'10mg'BUWjKeBD$  for moderate pain and $Remove'15mg'YkGpGEL$  for severe pain). 30 tablet 0  . potassium chloride SA (K-DUR,KLOR-CON) 20 MEQ tablet Take 2 tablets (40 mEq total) by mouth 2 (two) times daily. 10 tablet 0  . sodium phosphate (FLEET) 7-19 GM/118ML ENEM Place 133 mLs (1 enema total) rectally once as needed for severe constipation.  0    Social; the patient prior to wall of this lived in [place] with her husband in her own home. She is a nonsmoker no alcohol or drug use issues. Before her fall and fracture in July she was independent with ADLs and IADLs.  Family history of hypertension and diabetes in her mother and hypertension in her father.  Review of systems Gen. the patient states she feels well pain is manageable. HEENT no headache Respiratory no shortness of breath Cardiac no chest pain GI no abdominal pain or diarrhea although notable for diarrheal today. GU Foley catheter in place Extremities she is not complaining of pain in her stump on the left or the right foot Neurologic; no lateralizing weakness or numbness Mental status no obvious issues here Endocrine blood sugars appear to be stable. Last hemoglobin A1c was 7.1 at the end of October Hematologic no bleeding issues that the patient is aware of  Physical examination Gen. the patient looks well in no distress alert conversational and pleasant Vitals; O2 sat 96% on room air pulse rate 82 respirations 18 and unlabored. HEENT; eyes look normal oral exam normal Lymph nonpalpable in the cervical clavicular or axillary areas Respiratory clear entry bilaterally no wheezing work of breathing is normal Cardiac heart sounds are normal she appears to be euvolemic no murmurs Abdomen; bowel sounds positive no liver no spleen no masses and no tenderness. Rectal examination shows liquid stool which she  was incontinent GU Foley catheter is in place Extremities; her left stump has some service eschar that I did not remove I am not completely  certain that this is all well opposed in the middle. Her right leg has good peripheral pulses perhaps a stage I area on the right heel Skin; the patient has a deep probing stage 3-4 wound which goes from her left gluteal superiorly and does come precariously close to bone although I don't actually feel any. There is some surface left here mild to moderate. I think this could be addressed with Santyl and probably debridement with a curette. She is going to need an imaging study of the pelvis, if this is negative then I think a wound VAC can be put on quite soon. She has a stage II wound over the right gluteal this is not new either.  Neurologic; the patient has antigravity/good strength in both her proximal lower extremities. 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 Culture of this grew Escherichia coli she is on Rocephin and Flagyl. Antibiotics to continue through 10/29/15 at the direction of infectious disease. If the patient is to get pulse lavage she will need to be transported daily out of the facility during weekdays and this is not available on the weekends. Fortunately I don't really think that pulse lavage is absolutely necessary although it would be nice. If I can get along curette I think I can probably take most of this out early in the week. Santyl-based dressing with moist gauze to fill in the deficit in the meantime. As noted think she needs an x-ray of the pelvis to look at the underlying bone here. I'll see if a baseline sedimentation rate and C reactive protein was established if not reorder #2 mild acute renal failure this was addressed in the hospital. #3 liquid diarrhea present during my exam, she is at high rescue for C. difficile and appropriate orders will be left  if indeed she develops more than or equal to 3 liquid bowel movements in 24 hours #4 perfectly reasonable to leave the Foley catheter in place at this point. #5 I have some mild concern about the stump incision line will have to see how this looks early in the week. #6 type 2 diabetes on oral agents including Januvia, Glucophage, #7 hyperlipidemia on Mevacor #8 hypertension on Tenormin, Lotensin, Norvasc #9 anemia of chronic disease likely chronic malnutrition #10 profound protein calorie malnutrition with an albumin level of 1.6. He probably should be screened for protein losing nephropathy if she hasn't already had that. I don't think I ordered this she's been in and out of the hospital so frequently. I've spoken to her about the need for nutrition otherwise there will be no hope of healing the wound on her buttock.      Marland Kitchen

## 2015-10-27 ENCOUNTER — Other Ambulatory Visit: Payer: Self-pay

## 2015-10-27 ENCOUNTER — Telehealth: Payer: Self-pay | Admitting: *Deleted

## 2015-10-27 ENCOUNTER — Non-Acute Institutional Stay (SKILLED_NURSING_FACILITY): Payer: Commercial Managed Care - HMO | Admitting: Internal Medicine

## 2015-10-27 DIAGNOSIS — E43 Unspecified severe protein-calorie malnutrition: Secondary | ICD-10-CM

## 2015-10-27 DIAGNOSIS — L89324 Pressure ulcer of left buttock, stage 4: Secondary | ICD-10-CM

## 2015-10-27 MED ORDER — OXYCODONE HCL 5 MG PO TABS: 5.0000 mg | ORAL_TABLET | ORAL | Status: DC | PRN

## 2015-10-27 NOTE — Telephone Encounter (Signed)
Refill request [facility] 

## 2015-10-27 NOTE — Telephone Encounter (Signed)
Received fax from [facility] stating pt has been discharged from Hospital  Date of admit:10/14/15  Date of discharge: 10/24/15  Level of Care: Inpatient Acute Medical Care  [facility]  Attending physician: Prescott Gumondell Smith,MD  PCP: Kingsley SpittleKawanta [place],MD  Discharge Dispostion: discharge/transferred to Skilled nursing facility with Medicare Certification  DX: L89.90- Pressure ulcer of unspecified site,unspecifed stage

## 2015-10-29 ENCOUNTER — Encounter (HOSPITAL_COMMUNITY)
Admission: AD | Admit: 2015-10-29 | Discharge: 2015-10-29 | Disposition: A | Discharge status: Home / Self Care | Payer: Commercial Managed Care - HMO | Source: Skilled Nursing Facility | Attending: Internal Medicine | Admitting: Internal Medicine

## 2015-10-29 ENCOUNTER — Telehealth: Payer: Self-pay

## 2015-10-29 ENCOUNTER — Ambulatory Visit (HOSPITAL_COMMUNITY): Payer: Commercial Managed Care - HMO | Attending: Internal Medicine

## 2015-10-29 DIAGNOSIS — Z95828 Presence of other vascular implants and grafts: Secondary | ICD-10-CM | POA: Diagnosis present

## 2015-10-29 LAB — CBC WITH DIFFERENTIAL/PLATELET
Basophils Absolute: 0.1 10*3/uL (ref 0.0–0.1)
Basophils Relative: 0 %
EOS ABS: 1.9 10*3/uL — AB (ref 0.0–0.7)
EOS PCT: 14 %
HCT: 30.6 % — ABNORMAL LOW (ref 36.0–46.0)
Hemoglobin: 9.5 g/dL — ABNORMAL LOW (ref 12.0–15.0)
LYMPHS ABS: 1.1 10*3/uL (ref 0.7–4.0)
LYMPHS PCT: 8 %
MCH: 25.2 pg — AB (ref 26.0–34.0)
MCHC: 31 g/dL (ref 30.0–36.0)
MCV: 81.2 fL (ref 78.0–100.0)
MONO ABS: 1.1 10*3/uL — AB (ref 0.1–1.0)
Monocytes Relative: 8 %
Neutro Abs: 9.5 10*3/uL — ABNORMAL HIGH (ref 1.7–7.7)
Neutrophils Relative %: 70 %
PLATELETS: 386 10*3/uL (ref 150–400)
RBC: 3.77 MIL/uL — ABNORMAL LOW (ref 3.87–5.11)
RDW: 22.3 % — AB (ref 11.5–15.5)
WBC: 13.7 10*3/uL — ABNORMAL HIGH (ref 4.0–10.5)

## 2015-10-29 LAB — BASIC METABOLIC PANEL
Anion gap: 6 (ref 5–15)
BUN: 15 mg/dL (ref 6–20)
CHLORIDE: 99 mmol/L — AB (ref 101–111)
CO2: 25 mmol/L (ref 22–32)
CREATININE: 0.96 mg/dL (ref 0.44–1.00)
Calcium: 8.8 mg/dL — ABNORMAL LOW (ref 8.9–10.3)
GFR calc Af Amer: >60 mL/min (ref >60)
GFR calc non Af Amer: 57 mL/min — ABNORMAL LOW (ref >60)
GLUCOSE: 196 mg/dL — AB (ref 65–99)
POTASSIUM: 5.3 mmol/L — AB (ref 3.5–5.1)
SODIUM: 130 mmol/L — AB (ref 135–145)

## 2015-10-29 NOTE — Telephone Encounter (Signed)
Nurse from [facility] called to request a order for an X-Ray  To see if Pick line in patient is still working or if a new one is needed spoke with arlo.

## 2015-10-30 ENCOUNTER — Emergency Department (HOSPITAL_COMMUNITY): Payer: Commercial Managed Care - HMO

## 2015-10-30 ENCOUNTER — Emergency Department (HOSPITAL_COMMUNITY)
Admission: EM | Admit: 2015-10-30 | Discharge: 2015-10-31 | Disposition: A | Discharge status: Home / Self Care | Payer: Commercial Managed Care - HMO | Attending: Emergency Medicine | Admitting: Emergency Medicine

## 2015-10-30 ENCOUNTER — Encounter (HOSPITAL_COMMUNITY): Payer: Self-pay

## 2015-10-30 ENCOUNTER — Non-Acute Institutional Stay (SKILLED_NURSING_FACILITY): Payer: Commercial Managed Care - HMO | Admitting: Internal Medicine

## 2015-10-30 DIAGNOSIS — R799 Abnormal finding of blood chemistry, unspecified: Secondary | ICD-10-CM | POA: Diagnosis present

## 2015-10-30 DIAGNOSIS — L89324 Pressure ulcer of left buttock, stage 4: Secondary | ICD-10-CM | POA: Diagnosis not present

## 2015-10-30 DIAGNOSIS — Z79899 Other long term (current) drug therapy: Secondary | ICD-10-CM | POA: Diagnosis not present

## 2015-10-30 DIAGNOSIS — E785 Hyperlipidemia, unspecified: Secondary | ICD-10-CM | POA: Insufficient documentation

## 2015-10-30 DIAGNOSIS — D649 Anemia, unspecified: Secondary | ICD-10-CM | POA: Diagnosis not present

## 2015-10-30 DIAGNOSIS — I10 Essential (primary) hypertension: Secondary | ICD-10-CM | POA: Diagnosis not present

## 2015-10-30 DIAGNOSIS — N179 Acute kidney failure, unspecified: Secondary | ICD-10-CM

## 2015-10-30 DIAGNOSIS — E875 Hyperkalemia: Secondary | ICD-10-CM | POA: Diagnosis not present

## 2015-10-30 DIAGNOSIS — R7881 Bacteremia: Secondary | ICD-10-CM

## 2015-10-30 DIAGNOSIS — M1990 Unspecified osteoarthritis, unspecified site: Secondary | ICD-10-CM | POA: Insufficient documentation

## 2015-10-30 DIAGNOSIS — Z7982 Long term (current) use of aspirin: Secondary | ICD-10-CM | POA: Diagnosis not present

## 2015-10-30 DIAGNOSIS — L8990 Pressure ulcer of unspecified site, unspecified stage: Secondary | ICD-10-CM

## 2015-10-30 DIAGNOSIS — B9562 Methicillin resistant Staphylococcus aureus infection as the cause of diseases classified elsewhere: Secondary | ICD-10-CM

## 2015-10-30 DIAGNOSIS — F329 Major depressive disorder, single episode, unspecified: Secondary | ICD-10-CM | POA: Diagnosis not present

## 2015-10-30 DIAGNOSIS — L89311 Pressure ulcer of right buttock, stage 1: Secondary | ICD-10-CM | POA: Diagnosis not present

## 2015-10-30 DIAGNOSIS — Z7984 Long term (current) use of oral hypoglycemic drugs: Secondary | ICD-10-CM | POA: Insufficient documentation

## 2015-10-30 DIAGNOSIS — D72829 Elevated white blood cell count, unspecified: Secondary | ICD-10-CM | POA: Diagnosis not present

## 2015-10-30 DIAGNOSIS — E119 Type 2 diabetes mellitus without complications: Secondary | ICD-10-CM | POA: Diagnosis not present

## 2015-10-30 DIAGNOSIS — H269 Unspecified cataract: Secondary | ICD-10-CM | POA: Diagnosis not present

## 2015-10-30 LAB — COMPREHENSIVE METABOLIC PANEL
ALT: 10 U/L — AB (ref 14–54)
ANION GAP: 4 — AB (ref 5–15)
AST: 20 U/L (ref 15–41)
Albumin: 2.7 g/dL — ABNORMAL LOW (ref 3.5–5.0)
Alkaline Phosphatase: 68 U/L (ref 38–126)
BUN: 14 mg/dL (ref 6–20)
CHLORIDE: 100 mmol/L — AB (ref 101–111)
CO2: 25 mmol/L (ref 22–32)
CREATININE: 0.84 mg/dL (ref 0.44–1.00)
Calcium: 8.9 mg/dL (ref 8.9–10.3)
GFR calc non Af Amer: >60 mL/min (ref >60)
Glucose, Bld: 125 mg/dL — ABNORMAL HIGH (ref 65–99)
POTASSIUM: 5.8 mmol/L — AB (ref 3.5–5.1)
SODIUM: 129 mmol/L — AB (ref 135–145)
Total Bilirubin: 0.2 mg/dL — ABNORMAL LOW (ref 0.3–1.2)
Total Protein: 7.8 g/dL (ref 6.5–8.1)

## 2015-10-30 LAB — URINALYSIS, ROUTINE W REFLEX MICROSCOPIC
BILIRUBIN URINE: NEGATIVE
Glucose, UA: NEGATIVE mg/dL
Ketones, ur: NEGATIVE mg/dL
Nitrite: NEGATIVE
PH: 5.5 (ref 5.0–8.0)
SPECIFIC GRAVITY, URINE: 1.02 (ref 1.005–1.030)

## 2015-10-30 LAB — I-STAT CG4 LACTIC ACID, ED
LACTIC ACID, VENOUS: 1.79 mmol/L (ref 0.5–2.0)
LACTIC ACID, VENOUS: 1.5 mmol/L (ref 0.5–2.0)

## 2015-10-30 LAB — CBC WITH DIFFERENTIAL/PLATELET
BASOS ABS: 0.1 10*3/uL (ref 0.0–0.1)
BASOS PCT: 1 %
Eosinophils Absolute: 2.1 10*3/uL — ABNORMAL HIGH (ref 0.0–0.7)
Eosinophils Relative: 17 %
HCT: 29.7 % — ABNORMAL LOW (ref 36.0–46.0)
HEMOGLOBIN: 9.5 g/dL — AB (ref 12.0–15.0)
Lymphocytes Relative: 12 %
Lymphs Abs: 1.5 10*3/uL (ref 0.7–4.0)
MCH: 25.5 pg — ABNORMAL LOW (ref 26.0–34.0)
MCHC: 32 g/dL (ref 30.0–36.0)
MCV: 79.8 fL (ref 78.0–100.0)
MONO ABS: 1.3 10*3/uL — AB (ref 0.1–1.0)
Monocytes Relative: 11 %
NEUTROS ABS: 7.3 10*3/uL (ref 1.7–7.7)
NEUTROS PCT: 59 %
Platelets: 383 10*3/uL (ref 150–400)
RBC: 3.72 MIL/uL — ABNORMAL LOW (ref 3.87–5.11)
RDW: 22.1 % — ABNORMAL HIGH (ref 11.5–15.5)
WBC: 12.3 10*3/uL — ABNORMAL HIGH (ref 4.0–10.5)

## 2015-10-30 LAB — URINE MICROSCOPIC-ADD ON

## 2015-10-30 LAB — POTASSIUM: Potassium: 5.6 mmol/L — ABNORMAL HIGH (ref 3.5–5.1)

## 2015-10-30 MED ORDER — SODIUM POLYSTYRENE SULFONATE 15 GM/60ML PO SUSP
30.0000 g | Freq: Once | ORAL | Status: AC
  Administered 2015-10-31: 30 g via ORAL
  Filled 2015-10-30: qty 120

## 2015-10-30 MED ORDER — SODIUM CHLORIDE 0.9 % IV SOLN
1.0000 g | Freq: Once | INTRAVENOUS | Status: AC
  Administered 2015-10-31: 1 g via INTRAVENOUS
  Filled 2015-10-30: qty 10

## 2015-10-30 MED ORDER — SODIUM BICARBONATE 8.4 % IV SOLN
50.0000 meq | Freq: Once | INTRAVENOUS | Status: AC
  Administered 2015-10-31: 50 meq via INTRAVENOUS
  Filled 2015-10-30: qty 50

## 2015-10-30 MED ORDER — SODIUM CHLORIDE 0.9 % IV BOLUS (SEPSIS)
1000.0000 mL | Freq: Once | INTRAVENOUS | Status: AC
  Administered 2015-10-31: 1000 mL via INTRAVENOUS

## 2015-10-30 NOTE — ED Notes (Signed)
Pt sent here from the [facility] for evaluation of elevated white count and ulcer to left buttocks. Pt has a foley in and recent left AKA.

## 2015-10-30 NOTE — Discharge Instructions (Signed)
Hyperkalemia You need to have care for your wound at the wound center as well as plan during her hospital stay. Stop taking the potassium supplements. Also hold benazepril. Your potassium should be rechecked at the nursing home in 2 days. Return to the ED if you develop new or worsening symptoms. Hyperkalemia is when you have too much potassium in your blood. Potassium is normally removed (excreted) from your body by your kidneys. If there is too much potassium in your blood, it can affect your heart's ability to function.  CAUSES  Hyperkalemia may be caused by:   Taking in too much potassium. You can do this by:  Using salt substitutes. They contain large amounts of potassium.  Taking potassium supplements.  Eating foods high in potassium.  Excreting too little potassium. This can happen if:  Your kidneys are not working properly. Kidney (renal) disease, including short- or long-term renal failure, is a very common cause of hyperkalemia.  You are taking medicines that lower your excretion of potassium.  You have Addison disease.  You have a urinary tract blockage, such as kidney stones.  You are on treatment to mechanically clean your blood (dialysis) and you skip a treatment.  Releasing a high amount of potassium from your cells into your blood. This can happen with:  Injury to muscles (rhabdomyolysis) or other tissues. Most potassium is stored in your muscles.  Severe burns or infections.  Acidic blood plasma (acidosis). Acidosis can result from many diseases, such as uncontrolled diabetes. RISK FACTORS The most common risk factor of hyperkalemia is kidney disease. Other risk factors of hyperkalemia include:  Addison disease. This is a condition where your glands do not produce enough hormones.  Alcoholism or heavy drug use.   Using certain blood pressure medicines, such as angiotensin-converting enzyme (ACE) inhibitors, angiotensin II receptor blockers (ARBs), or  potassium-sparing diuretics such as spironolactone.  Severe injury or burn. SIGNS AND SYMPTOMS  Oftentimes, there are no signs or symptoms of hyperkalemia. However, when your potassium level becomes high enough, you may experience symptoms such as:  Irregular or very slow heartbeat.  Nausea.  Fatigue.  Tingling of the skin or numbness of the hands or feet.  Muscle weakness.  Fatigue.  Not being able to move (paralysis). You may not have any symptoms of hyperkalemia.  DIAGNOSIS  Hyperkalemia may be diagnosed by:  Physical exam.  Blood tests.  ECG (electrocardiogram).  Discussion of prescription and non-prescription drug use. TREATMENT  Treatment for hyperkalemia is often directed at the underlying cause. In some instances, treatment may include:   Insulin.  Glucose (sugar) and water solution given through a vein (intravenous or IV).  Dialysis.  Medicines to remove the potassium from your body.  Medicines to move calcium from your bloodstream into your tissues. HOME CARE INSTRUCTIONS   Take medicines only as directed by your health care provider.  Do not take any supplements, natural products, herbs, or vitamins without reviewing them with your health care provider. Certain supplements and natural food products can have high amounts of potassium.  Limit your alcohol intake as directed by your health care provider.  Stop illegal drug use. If you need help quitting, ask your health care provider.  Keep all follow-up visits as directed by your health care provider. This is important.  If you have kidney disease, you may need to follow a low potassium diet. A dietitian can help educate you on low potassium foods. SEEK MEDICAL CARE IF:   You notice an irregular  or very slow heartbeat.  You feel light-headed.  You feel weak.  You are nauseous.  You have tingling or numbness in your hands or feet. SEEK IMMEDIATE MEDICAL CARE IF:   You have shortness of  breath.  You have chest pain or discomfort.  You pass out.  You have muscle paralysis. MAKE SURE YOU:   Understand these instructions.  Will watch your condition.  Will get help right away if you are not doing well or get worse.   This information is not intended to replace advice given to you by your health care provider. Make sure you discuss any questions you have with your health care provider.   Document Released: 11/12/2002 Document Revised: 12/13/2014 Document Reviewed: 02/27/2014 Elsevier Interactive Patient Education [employer]2016 [employer]

## 2015-10-30 NOTE — ED Notes (Signed)
Pt placed on bedpan - had loose BM-  pericare performed -

## 2015-10-30 NOTE — ED Provider Notes (Signed)
CSN: [id number]     Arrival date & time 10/30/15  1842 History   First MD Initiated Contact with Patient 10/30/15 1850     Chief Complaint  Patient presents with  . Abnormal Lab     (Consider location/radiation/quality/duration/timing/severity/associated sxs/prior Treatment) HPI Comments: Patient sent from nursing home with leukocytosis on blood work drawn yesterday. Uncertain why this blood work was obtained. White count was 13.7. Patient with no history of drainage and ulceration to her left buttock. 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 She was on IV antibiotics that she completed yesterday. Patient states she feels well. Denies fever. Denies vomiting. Denies any chest pain or shortness of breath.  The history is provided by a caregiver.    Past Medical History  Diagnosis Date  . Diabetes mellitus     takes Januvia,Metformin,and Glipizide daily  . Hyperlipidemia     takes Lovastatin nightly  . Anemia     takes Ferrous Gluconate daily  . Peripheral edema     takes Furosemide daily as needed  . Depression     takes Tofranil daily  . Hypertension     takes Amlodipine,Atenolol,and Benazepril daily  . Arthritis   . Joint pain   . Joint swelling   . Cataracts, bilateral     immature  . Infection of skin of knee     left  . 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 Breast biopsy Left   . Colonoscopy    . Esophagogastroduodenoscopy    . Ercp    . Patellectomy Left 07/11/2015    Procedure: Left Partial PATELLECTOMY;  Surgeon: Tarry Kos, MD;  Location: [facility];  Service: Orthopedics;  Laterality: Left;  . Irrigation and debridement knee Left 08/15/2015    Procedure: IRRIGATION AND DEBRIDEMENT LEFT KNEE;  Surgeon: Tarry Kos, MD;  Location: [facility];  Service: Orthopedics;  Laterality: Left;  . Patellectomy Left 08/15/2015    Procedure: Revision PATELLECTOMY;  Surgeon:  Tarry Kos, MD;  Location: [facility];  Service: Orthopedics;  Laterality: Left;  . I&d extremity Left 08/18/2015    Procedure: IRRIGATION AND DEBRIDEMENT EXTREMITY;  Surgeon: Tarry Kos, MD;  Location: [facility];  Service: Orthopedics;  Laterality: Left;  Marland Kitchen Minor application of wound vac  08/18/2015    Procedure: MINOR APPLICATION OF WOUND VAC;  Surgeon: Tarry Kos, MD;  Location: [facility];  Service: Orthopedics;;  . Irrigation and debridement knee Left 08/20/2015    Procedure: IRRIGATION AND DEBRIDEMENT KNEE WITH PLACEMENT OF INTEGRA;  Surgeon: Tarry Kos, MD;  Location: [facility];  Service: Orthopedics;  Laterality: Left;  . I&d extremity Left 09/10/2015    Procedure: LEFT KNEE SPLIT THICKNESS SKIN GRAFT, IRRIGATION AND DEBRIDEMENT, WOUND VAC;  Surgeon: Tarry Kos, MD;  Location: [facility];  Service: Orthopedics;  Laterality: Left;  . Skin split graft Left 09/10/2015    Procedure: SKIN GRAFT SPLIT THICKNESS;  Surgeon: Tarry Kos, MD;  Location: [facility];  Service: Orthopedics;  Laterality: Left;  . Application of wound vac Left 09/10/2015    Procedure: APPLICATION OF WOUND VAC;  Surgeon: Tarry Kos, MD;  Location: [facility];  Service: Orthopedics;  Laterality: Left;  . I&d extremity Left 09/12/2015    Procedure: IRRIGATION AND DEBRIDEMENT LEFT KNEE WITH PLACEMENT OF WOUND VAC;  Surgeon: Tarry Kos, MD;  Location: [facility];  Service: Orthopedics;  Laterality: Left;  . Amputation Left 09/28/2015    Procedure: AMPUTATION ABOVE KNEE;  Surgeon: Nadara Mustard, MD;  Location: [facility];  Service: Orthopedics;  Laterality: Left;  . Tee without cardioversion N/A 10/02/2015    Procedure: TRANSESOPHAGEAL ECHOCARDIOGRAM (TEE);  Surgeon: Chilton Si, MD;  Location: [facility] [place] ENDOSCOPY;  Service: Cardiovascular;  Laterality: N/A;   Family History  Problem Relation Age of Onset  . Hypertension Mother   . Diabetes Mother   . Hypertension Father    Social History  Substance Use Topics  . Smoking status: Never Smoker   .  Smokeless tobacco: Never Used  . Alcohol Use: No   OB History    No data available     Review of Systems  Constitutional: Negative for fever, activity change and appetite change.  HENT: Negative for congestion.   Eyes: Negative for visual disturbance.  Respiratory: Negative for cough, chest tightness and shortness of breath.   Cardiovascular: Negative for chest pain.  Gastrointestinal: Negative for nausea, vomiting and abdominal pain.  Genitourinary: Negative for dysuria, hematuria, vaginal bleeding and vaginal discharge.  Musculoskeletal: Negative for myalgias and arthralgias.  Skin: Positive for wound.  Neurological: Negative for dizziness, weakness and headaches.  A complete 10 system review of systems was obtained and all systems are negative except as noted in the HPI and PMH.      Allergies  Review of patient's allergies indicates no known allergies.  Home Medications   Prior to Admission medications   Medication Sig Start Date End Date Taking? Authorizing Provider  amLODipine (NORVASC) 10 MG tablet TAKE 1 TABLET EVERY DAY 03/26/15  Yes Salley Scarlet, MD  aspirin EC 325 MG tablet Take 1 tablet (325 mg total) by mouth daily. 07/11/15  Yes Tarry Kos, MD  atenolol (TENORMIN) 50 MG tablet Take 1 tablet (50 mg total) by mouth daily. 03/14/14  Yes Salley Scarlet, MD  benazepril (LOTENSIN) 20 MG tablet TAKE 1 TABLET EVERY DAY 03/26/15  Yes Salley Scarlet, MD  ferrous sulfate 325 (65 FE) MG tablet Take 325 mg by mouth daily with breakfast.   Yes Historical Provider, MD  furosemide (LASIX) 20 MG tablet Take 20 mg by mouth daily.   Yes Historical Provider, MD  imipramine (TOFRANIL) 50 MG tablet Take 1 tablet (50 mg total) by mouth 3 (three) times daily. 04/21/15  Yes Salley Scarlet, MD  JANUVIA 25 MG tablet TAKE ONE TABLET BY MOUTH ONCE DAILY 06/04/15  Yes Salley Scarlet, MD  lactulose (CHRONULAC) 10 GM/15ML solution Take 30 mLs (20 g total) by mouth daily. 10/24/15  Yes Osvaldo Shipper, MD  lovastatin (MEVACOR) 10 MG tablet Take 1 tablet (10 mg total) by mouth at bedtime. 05/21/14  Yes Salley Scarlet, MD  metFORMIN (GLUCOPHAGE) 1000 MG tablet TAKE 1 TABLET TWICE DAILY WITH A MEAL Patient taking differently: Take 1,000 mg by mouth 2 (two) times daily with a meal. TAKE 1 TABLET TWICE DAILY WITH A MEAL 04/21/15  Yes Salley Scarlet, MD  potassium chloride SA (K-DUR,KLOR-CON) 20 MEQ tablet Take 2 tablets (40 mEq total) by mouth 2 (two) times daily. 10/07/15  Yes Rhetta Mura, MD  cefTRIAXone 1 g in dextrose 5 % 50 mL Inject 1 g into the vein daily. To be given till 10/29/15. Patient not taking: Reported on 10/30/2015 10/24/15   Osvaldo Shipper, MD  collagenase (SANTYL) ointment Apply topically daily. Patient not taking: Reported on 10/30/2015 10/24/15   Osvaldo Shipper, MD  glucose  blood (ACCU-CHEK AVIVA) test strip Use to monitor FSBS 1x daily. Dx: E11.9 12/31/14   Salley Scarlet, MD  hydrocerin (EUCERIN) CREA Apply 1 application topically 2 (two) times daily. Patient not taking: Reported on 10/30/2015 10/24/15   Osvaldo Shipper, MD  metroNIDAZOLE (FLAGYL) 500 MG tablet Take 1 tablet (500 mg total) by mouth every 8 (eight) hours. Till 10/29/15. Patient not taking: Reported on 10/30/2015 10/24/15   Osvaldo Shipper, MD  oxyCODONE (OXY IR/ROXICODONE) 5 MG immediate release tablet Take 1-3 tablets (5-15 mg total) by mouth every 4 (four) hours as needed (  for mild pain  for moderate pain and  for severe pain). 10/27/15   Tiffany L Reed, DO  sodium phosphate (FLEET) 7-19 GM/118ML ENEM Place 133 mLs (1 enema total) rectally once as needed for severe constipation. 10/24/15   Osvaldo Shipper, MD   BP 123/60 mmHg  Pulse 78  Temp(Src) 99.4 F (37.4 C) (Rectal)  Resp 18  Ht  (1.778 m)  Wt 200 lb (90.719 kg)  BMI 28.70 kg/m2  SpO2 93% Physical Exam  Constitutional: She is oriented to person, place, and time. She appears well-developed and well-nourished. No  distress.  HENT:  Head: Normocephalic and atraumatic.  Mouth/Throat: Oropharynx is clear and moist. No oropharyngeal exudate.  Eyes: Conjunctivae and EOM are normal. Pupils are equal, round, and reactive to light.  Neck: Normal range of motion. Neck supple.  No meningismus.  Cardiovascular: Normal rate, regular rhythm, normal heart sounds and intact distal pulses.   No murmur heard. Pulmonary/Chest: Effort normal and breath sounds normal. No respiratory distress.  Abdominal: Soft. There is no tenderness. There is no rebound and no guarding.  Musculoskeletal: Normal range of motion. She exhibits no edema or tenderness.  L AKA well healed L buttock with 4 cm deep ulcer almost to bone. Large amount of yellow drainage Shallow stage 1 ulcer to R buttock  Neurological: She is alert and oriented to person, place, and time. No cranial nerve deficit. She exhibits normal muscle tone. Coordination normal.  No ataxia on finger to nose bilaterally. No pronator drift. 5/5 strength throughout. CN 2-12 intact.Equal grip strength. Sensation intact.   Skin: Skin is warm.  Psychiatric: She has a normal mood and affect. Her behavior is normal.  Nursing note and vitals reviewed.   ED Course  Procedures (including critical care time) Labs Review Labs Reviewed  CBC WITH DIFFERENTIAL/PLATELET - Abnormal; Notable for the following:    WBC 12.3 (*)    RBC 3.72 (*)    Hemoglobin 9.5 (*)    HCT 29.7 (*)    MCH 25.5 (*)    RDW 22.1 (*)    Monocytes Absolute 1.3 (*)    Eosinophils Absolute 2.1 (*)    All other components within normal limits  COMPREHENSIVE METABOLIC PANEL - Abnormal; Notable for the following:    Sodium 129 (*)    Potassium 5.8 (*)    Chloride 100 (*)    Glucose, Bld 125 (*)    Albumin 2.7 (*)    ALT 10 (*)    Total Bilirubin 0.2 (*)    Anion gap 4 (*)    All other components within normal limits  URINALYSIS, ROUTINE W REFLEX MICROSCOPIC (NOT AT [facility]) - Abnormal; Notable for the  following:    Color, Urine BROWN (*)    APPearance HAZY (*)    Hgb urine dipstick LARGE (*)    Protein, ur TRACE (*)    Leukocytes, UA MODERATE (*)  All other components within normal limits  URINE MICROSCOPIC-ADD ON - Abnormal; Notable for the following:    Squamous Epithelial / LPF 0-5 (*)    Bacteria, UA MANY (*)    All other components within normal limits  CULTURE, BLOOD (ROUTINE X 2)  CULTURE, BLOOD (ROUTINE X 2)  URINE CULTURE  POTASSIUM  I-STAT CG4 LACTIC ACID, ED    Imaging Review Dg Chest 1 View  10/29/2015  CLINICAL DATA:  Evaluate PICC line placement EXAM: CHEST 1 VIEW COMPARISON:  Portable chest x-ray of 10/14/2015 FINDINGS: The tip of the PICC line overlies the the left axilla probably within the left axillary vein or distal left subclavian vein. No pneumothorax is seen. The lungs are clear. Heart size is stable. IMPRESSION: The tip of the left PICC line overlies the region of the expected left axillary vein or distal left subclavian vein. No pneumothorax. Electronically Signed   By: Dwyane DeePaul  Barry M.D.   On: 10/29/2015 13:49   Dg Chest Portable 1 View  10/30/2015  CLINICAL DATA:  Fever EXAM: PORTABLE CHEST 1 VIEW COMPARISON:  October 29, 2015 FINDINGS: There is mild atelectasis in the right lower lobe region. The lungs elsewhere clear. Heart size and pulmonary vascularity are normal. No adenopathy. Central catheter no longer appreciable. No pneumothorax. IMPRESSION: Mild atelectasis right lower lobe region. No edema or consolidation. Electronically Signed   By: Bretta BangWilliam  Woodruff III M.D.   On: 10/30/2015 19:51   Dg Humerus Left  10/29/2015  CLINICAL DATA:  PICC line placement. EXAM: LEFT HUMERUS - 2+ VIEW COMPARISON:  None. FINDINGS: There is no evidence of fracture or other focal bone lesions. PICC line is seen in soft tissues of left upper arm, with distal tip in expected position of left axillary vein. IMPRESSION: No fracture or dislocation is noted. Distal tip of PICC  line is in expected position of the left axillary vein. Electronically Signed   By: Lupita RaiderJames  Green Jr, M.D.   On: 10/29/2015 13:47   I have personally reviewed and evaluated these images and lab results as part of my medical decision-making.   EKG Interpretation   Date/Time:  Thursday October 30 2015 21:47:03 EST Ventricular Rate:  82 PR Interval:  166 QRS Duration: 144 QT Interval:  375 QTC Calculation: 438 R Axis:   52 Text Interpretation:  Sinus rhythm Right bundle branch block No  significant change was found Confirmed by Manus GunningANCOUR  MD, Dameian Crisman [phone]([id number]) on  10/30/2015 9:54:40 PM      MDM   Final diagnoses:  None   patient sent from nursing home with elevated white blood cell count. No reported fever. The patient completed prolonged course of antibiotics (rocephin and flagyl) yesterday for unstageable sacral ulcer.  Labs show white count of 12. Mild hyponatremia and hyperkalemia. No EKG changes related to hyperkalemia.  CT does not show any bony involvement or drainable abscess. UA appears dirty but is from catheter sample.  Culture sent.  Patient noted to have mild hyperkalemia without EKG changes. 5.6 on recheck. According to her medication list she is still taking potassium supplements. She'll be instructed to stop these. She is given a dose of Kayexalate and bicarbonate. She will also need to hold her ACE inhibitor.  Case discussed with hospitalist Dr. Konrad DoloresMerrell. He feels patient does not need any further antibiotics for her wound but needs to have local wound care at the wound center which does not appear to have started yet. Dr. Konrad DoloresMerrell does not feel that patient needs  to be admitted. She is nontoxic-appearing and anxious to go back to her nursing home.  Advised to stop potassium supplements. Will need recheck of potassium at nursing home. Will also need aggressive local wound care.   Glynn Octave, MD 10/31/15 [phone]

## 2015-10-30 NOTE — ED Notes (Signed)
EKG done and patient on cardiac monitor. 

## 2015-10-31 NOTE — ED Notes (Signed)
Ass[place]. Pt resting quietly at this time. Awaiting EDP disposition. Denies pain. No distress. Vitals stable. On cardiac, NBP, pulse ox monitoring.

## 2015-11-01 ENCOUNTER — Encounter: Payer: Self-pay | Admitting: Internal Medicine

## 2015-11-01 ENCOUNTER — Other Ambulatory Visit (HOSPITAL_COMMUNITY)
Admission: AD | Admit: 2015-11-01 | Discharge: 2015-11-01 | Disposition: A | Discharge status: Home / Self Care | Payer: Commercial Managed Care - HMO | Source: Skilled Nursing Facility | Attending: Emergency Medicine | Admitting: Emergency Medicine

## 2015-11-01 DIAGNOSIS — I10 Essential (primary) hypertension: Secondary | ICD-10-CM | POA: Insufficient documentation

## 2015-11-01 LAB — BASIC METABOLIC PANEL
ANION GAP: 6 (ref 5–15)
BUN: 11 mg/dL (ref 6–20)
CHLORIDE: 102 mmol/L (ref 101–111)
CO2: 25 mmol/L (ref 22–32)
Calcium: 8.6 mg/dL — ABNORMAL LOW (ref 8.9–10.3)
Creatinine, Ser: 0.72 mg/dL (ref 0.44–1.00)
GFR calc non Af Amer: >60 mL/min (ref >60)
GLUCOSE: 108 mg/dL — AB (ref 65–99)
POTASSIUM: 4.2 mmol/L (ref 3.5–5.1)
Sodium: 133 mmol/L — ABNORMAL LOW (ref 135–145)

## 2015-11-01 LAB — CBC WITH DIFFERENTIAL/PLATELET
BASOS PCT: 1 %
Basophils Absolute: 0.1 10*3/uL (ref 0.0–0.1)
Eosinophils Absolute: 1.7 10*3/uL — ABNORMAL HIGH (ref 0.0–0.7)
Eosinophils Relative: 19 %
HEMATOCRIT: 28.4 % — AB (ref 36.0–46.0)
HEMOGLOBIN: 8.9 g/dL — AB (ref 12.0–15.0)
LYMPHS ABS: 1.9 10*3/uL (ref 0.7–4.0)
LYMPHS PCT: 20 %
MCH: 25.2 pg — ABNORMAL LOW (ref 26.0–34.0)
MCHC: 31.3 g/dL (ref 30.0–36.0)
MCV: 80.5 fL (ref 78.0–100.0)
MONO ABS: 1 10*3/uL (ref 0.1–1.0)
MONOS PCT: 11 %
NEUTROS ABS: 4.5 10*3/uL (ref 1.7–7.7)
NEUTROS PCT: 49 %
Platelets: 327 10*3/uL (ref 150–400)
RBC: 3.53 MIL/uL — ABNORMAL LOW (ref 3.87–5.11)
RDW: 22.2 % — AB (ref 11.5–15.5)
WBC: 9.2 10*3/uL (ref 4.0–10.5)

## 2015-11-01 LAB — URINE CULTURE

## 2015-11-01 NOTE — Progress Notes (Signed)
Patient ID: Lindsay Cobb, female   DOB: 1941-05-06, 74 y.o.   MRN: [id number]   Facility; [facility] This is an acute visit  Chief complaint; acute visit secondary leukocytosis with history of decubitus ulcer buttocks   HPI--this is a pleasant 74 year old female who has had a somewhat complicated course recently. She had initially been admitted here after a stay at [facility] from 10/5 through 09/16/2015. At that point she had had a wound infection after suffering a patella fracture in July of this year. Cultures of that left knee grew MRSA. She came to [place] with a wound VAC on the left knee and on IV vancomycin. She was admitted back to the hospital from 10/16-11/2/16 when she developed a high fever. 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 Her postoperative blood cultures were still positive for MRSA and she came back to [place] on Cubicin for 8 weeks. Her TEE did not show any vegetations.  The patient's hospitalization most recently  was prompted by a high ESR and C-reactive protein being faxed to infectious disease. I think the concern was this that she was still actively septic related to the previous infection in her left knee. What she actually had was an unstageable wound over her left gluteal area. This underwent an I&D by general surgery cultures grew Escherichia coli and she has completed  2 weeks of Rocephin and Flagyl as of yesterday--.    Patient was also felt on most recent  admission to have a possible UTI with 80,000 colonies of Escherichia coli. This was covered by the Rocephin that she is on for the Escherichia coli in her wound. She was not felt to have need further need for daptomycin. She was felt to have renal insufficiency and anemia of chronic disease. Her admission creatinine was 1.5 to increasing to a peak of 1.99 but discharged with a creatinine of 0.98--this is arranged stable with a creatinine of 0.96 on lab done on November 23.  Lab done on the 23rd is  significant however for an elevated white count of 13.7-she denies any fever or chills-per nursing they are concerned somewhat with the buttocks wound thinking it may be worsening   She is also on aggressive potassium supplementation at 40 mEq twice a day-potassium is elevated at 5.3 per lab done yesterday  Labs.  10/29/2015.  Sodium 1:30 potassium 5.3 BUN 15 creatinine 0.96.  WBC 13.7 hemoglobin 9.5 platelets 386  BMP Latest Ref Rng 10/23/2015 10/22/2015 10/19/2015  Glucose 65 - 99 mg/dL 102(H) 88 120(H)  BUN 6 - 20 mg/dL _0 Creatinine 0.44 - 1.00 mg/dL 0.98 1.05(H) 1.13(H)  Sodium 135 - 145 mmol/L 134(L) 136 135  Potassium 3.5 - 5.1 mmol/L 3.5 3.4(L) 3.8  Chloride 101 - 111 mmol/L 103 103 103  CO2 22 - 32 mmol/L _1 Calcium 8.9 - 10.3 mg/dL 8.5(L) 8.4(L) 8.4(L)    CBC Latest Ref Rng 10/22/2015 10/19/2015 10/17/2015  WBC 4.0 - 10.5 K/uL 9.8 9.0 11.8(H)  Hemoglobin 12.0 - 15.0 g/dL 7.7(L) 7.6(L) 7.3(L)  Hematocrit 36.0 - 46.0 % 25.4(L) 24.8(L) 23.5(L)  Platelets 150 - 400 K/uL 423(H) 472(H) 478(H)    Past Medical History  Diagnosis Date  . Diabetes mellitus     takes Januvia,Metformin,and Glipizide daily  . Hyperlipidemia     takes Lovastatin nightly  . Anemia     takes Ferrous Gluconate daily  . Peripheral edema     takes Furosemide daily as needed  .  Depression     takes Tofranil daily  . Hypertension     takes Amlodipine,Atenolol,and Benazepril daily  . Arthritis   . Joint pain   . Joint swelling   . Cataracts, bilateral     immature  . Infection of skin of knee     left  . Decubitus ulcer of buttock, unstageable ([place]) 10/2015   Past Surgical History  Procedure Laterality Date  . Breast biopsy Left   . Colonoscopy    . Esophagogastroduodenoscopy    . Ercp    . Patellectomy Left 07/11/2015    Procedure: Left Partial PATELLECTOMY;  Surgeon: Leandrew Koyanagi, MD;  Location: [place];  Service: Orthopedics;  Laterality: Left;  . Irrigation and debridement  knee Left 08/15/2015    Procedure: IRRIGATION AND DEBRIDEMENT LEFT KNEE;  Surgeon: Leandrew Koyanagi, MD;  Location: [place];  Service: Orthopedics;  Laterality: Left;  . Patellectomy Left 08/15/2015    Procedure: Revision PATELLECTOMY;  Surgeon: Leandrew Koyanagi, MD;  Location: [place];  Service: Orthopedics;  Laterality: Left;  . I&d extremity Left 08/18/2015    Procedure: IRRIGATION AND DEBRIDEMENT EXTREMITY;  Surgeon: Leandrew Koyanagi, MD;  Location: [place];  Service: Orthopedics;  Laterality: Left;  Marland Kitchen Minor application of wound vac  08/18/2015    Procedure: MINOR APPLICATION OF WOUND VAC;  Surgeon: Leandrew Koyanagi, MD;  Location: [place];  Service: Orthopedics;;  . Irrigation and debridement knee Left 08/20/2015    Procedure: IRRIGATION AND DEBRIDEMENT KNEE WITH PLACEMENT OF INTEGRA;  Surgeon: Leandrew Koyanagi, MD;  Location: [place];  Service: Orthopedics;  Laterality: Left;  . I&d extremity Left 09/10/2015    Procedure: LEFT KNEE SPLIT THICKNESS SKIN GRAFT, IRRIGATION AND DEBRIDEMENT, WOUND VAC;  Surgeon: Leandrew Koyanagi, MD;  Location: [place];  Service: Orthopedics;  Laterality: Left;  . Skin split graft Left 09/10/2015    Procedure: SKIN GRAFT SPLIT THICKNESS;  Surgeon: Leandrew Koyanagi, MD;  Location: [place];  Service: Orthopedics;  Laterality: Left;  . Application of wound vac Left 09/10/2015    Procedure: APPLICATION OF WOUND VAC;  Surgeon: Leandrew Koyanagi, MD;  Location: [place];  Service: Orthopedics;  Laterality: Left;  . I&d extremity Left 09/12/2015    Procedure: IRRIGATION AND DEBRIDEMENT LEFT KNEE WITH PLACEMENT OF WOUND VAC;  Surgeon: Leandrew Koyanagi, MD;  Location: [place];  Service: Orthopedics;  Laterality: Left;  . Amputation Left 09/28/2015    Procedure: AMPUTATION ABOVE KNEE;  Surgeon: Newt Minion, MD;  Location: [place];  Service: Orthopedics;  Laterality: Left;  . Tee without cardioversion N/A 10/02/2015    Procedure: TRANSESOPHAGEAL ECHOCARDIOGRAM (TEE);  Surgeon: Skeet Latch, MD;  Location[facility];  Service:  Cardiovascular;  Laterality: N/A;   Current Outpatient Prescriptions on File Prior to Visit  Medication Sig Dispense Refill  . amLODipine (NORVASC) 10 MG tablet TAKE 1 TABLET EVERY DAY 90 tablet 1  . aspirin EC 325 MG tablet Take 1 tablet (325 mg total) by mouth daily. 84 tablet 0  . atenolol (TENORMIN) 50 MG tablet Take 1 tablet (50 mg total) by mouth daily. 90 tablet 1  . benazepril (LOTENSIN) 20 MG tablet TAKE 1 TABLET EVERY DAY 90 tablet 1  . cefTRIAXone 1 g in dextrose 5 % 50 mL Inject 1 g into the vein daily. To be given till 10/29/15.    . collagenase (SANTYL) ointment Apply topically daily. 15 g 0  . ferrous sulfate 325 (65 FE) MG  tablet Take 325 mg by mouth daily with breakfast.    . furosemide (LASIX) 20 MG tablet Take 20 mg by mouth daily.    Marland Kitchen glucose blood (ACCU-CHEK AVIVA) test strip Use to monitor FSBS 1x daily. Dx: E11.9 50 each 6  . hydrocerin (EUCERIN) CREA Apply 1 application topically 2 (two) times daily.  0  . imipramine (TOFRANIL) 50 MG tablet Take 1 tablet (50 mg total) by mouth 3 (three) times daily. 270 tablet 1  . JANUVIA 25 MG tablet TAKE ONE TABLET BY MOUTH ONCE DAILY 30 tablet 3  . lactulose (CHRONULAC) 10 GM/15ML solution Take 30 mLs (20 g total) by mouth daily. 240 mL 0  . lovastatin (MEVACOR) 10 MG tablet Take 1 tablet (10 mg total) by mouth at bedtime. (Patient not taking: Reported on 10/14/2015) 90 tablet 2  . metFORMIN (GLUCOPHAGE) 1000 MG tablet TAKE 1 TABLET TWICE DAILY WITH A MEAL 180 tablet 1  . metroNIDAZOLE (FLAGYL) 500 MG tablet Take 1 tablet (500 mg total) by mouth every 8 (eight) hours. Till 10/29/15.    Marland Kitchen oxyCODONE (OXY IR/ROXICODONE) 5 MG immediate release tablet Take 1-3 tablets (5-15 mg total) by mouth every 4 (four) hours as needed (71m for mild pain 140mfor moderate pain and 1514mor severe pain). 30 tablet 0  . potassium chloride SA (K-DUR,KLOR-CON) 20 MEQ tablet Take 2 tablets (40 mEq total) by mouth 2 (two) times daily. 10 tablet 0  . sodium  phosphate (FLEET) 7-19 GM/118ML ENEM Place 133 mLs (1 enema total) rectally once as needed for severe constipation.  0    Social; the patient prior to wall of this lived in [place] her husband in her own home. She is a nonsmoker no alcohol or drug use issues. Before her fall and fracture in July she was independent with ADLs and IADLs.  Family history of hypertension and diabetes in her mother and hypertension in her father.  Review of systems Gen. the patient states she feels well pain is manageable Skin-again has an unstageable buttocks wound is followed by wound care as well as Dr. RobDellia Nimss completed antibiotic as of yesterday. HEENT no headache Respiratory no shortness of breath Cardiac no chest pain GI no abdominal pain or diarrhea  GU Foley catheter in place Extremities she is not complaining of pain in her stump on the left or the right foot Neurologic; no lateralizing weakness or numbness Mental status no obvious issues here    Physical examination Temperature 98.4 pulse 82 respirations 20 blood pressure 142/59 Gen. the patient looks well in no distress alert conversational and pleasant V HEENT; eyes look normal oral exam normal Respiratory clear entry bilaterally no wheezing work of breathing is normal Cardiac heart sounds are normal she appears to be euvolemic no murmurs Abdomen; bowel sounds positive no liver no spleen no masses and no tenderness.  GU Foley catheter is in place Extremities; her left stump appears to be stable I do not see any drainage bleeding or sign of infection at the site  Skin; currently this area is covered per previous progress note the patient has a deep probing stage 3-4 wound which goes from her left gluteal superiorly and does come p close to bone   She has a stage II wound over the right gluteal this is not new either.  Neurologic; the patient has antigravity/good strength in both her proximal lower extremities. There  is no lateralizing neurologic signs    Impression/plan #1 infected decubitus  ulcer over her left gluteal area stage 3-4. Culture of this grew Escherichia coli she has completed Rocephin and Flagyl.  Patient's white count is trending up at 13.7 it had normalized-apparently this is a complicated situation-at this point will send her to the ER for prompt evaluation for concerns of this elevated white count-she is a  fragile individual in this regards and will be aggressive here clinically she does not appear to be septic or unstable  #2 mild acute renal failure this was addressed in the hospital--appears to have stabilized with a creatinine of 0.96 BUN of 15 on lab done October 29 2015  #3 hyperkalemia-she is on aggressive supplementation here and appears we can discontinue this again will await ER evaluation I suspect they will draw labs as well in regards to her potassium.  [id number].           Marland Kitchen

## 2015-11-03 ENCOUNTER — Non-Acute Institutional Stay (SKILLED_NURSING_FACILITY): Payer: Commercial Managed Care - HMO | Admitting: Internal Medicine

## 2015-11-03 ENCOUNTER — Encounter (HOSPITAL_COMMUNITY)
Admission: RE | Admit: 2015-11-03 | Discharge: 2015-11-03 | Disposition: A | Discharge status: Home / Self Care | Payer: Commercial Managed Care - HMO | Source: Skilled Nursing Facility | Attending: Internal Medicine | Admitting: Internal Medicine

## 2015-11-03 DIAGNOSIS — L89324 Pressure ulcer of left buttock, stage 4: Secondary | ICD-10-CM | POA: Diagnosis not present

## 2015-11-03 DIAGNOSIS — D72829 Elevated white blood cell count, unspecified: Secondary | ICD-10-CM | POA: Diagnosis not present

## 2015-11-03 LAB — CBC WITH DIFFERENTIAL/PLATELET
BASOS ABS: 0.1 10*3/uL (ref 0.0–0.1)
BASOS PCT: 1 %
EOS ABS: 0.6 10*3/uL (ref 0.0–0.7)
Eosinophils Relative: 8 %
HEMATOCRIT: 28.6 % — AB (ref 36.0–46.0)
HEMOGLOBIN: 9.2 g/dL — AB (ref 12.0–15.0)
Lymphocytes Relative: 16 %
Lymphs Abs: 1.3 10*3/uL (ref 0.7–4.0)
MCH: 25.6 pg — ABNORMAL LOW (ref 26.0–34.0)
MCHC: 32.2 g/dL (ref 30.0–36.0)
MCV: 79.7 fL (ref 78.0–100.0)
MONOS PCT: 10 %
Monocytes Absolute: 0.8 10*3/uL (ref 0.1–1.0)
NEUTROS ABS: 5.4 10*3/uL (ref 1.7–7.7)
NEUTROS PCT: 66 %
Platelets: 373 10*3/uL (ref 150–400)
RBC: 3.59 MIL/uL — AB (ref 3.87–5.11)
RDW: 21.3 % — ABNORMAL HIGH (ref 11.5–15.5)
WBC: 8.2 10*3/uL (ref 4.0–10.5)

## 2015-11-03 LAB — BASIC METABOLIC PANEL
ANION GAP: 4 — AB (ref 5–15)
BUN: 10 mg/dL (ref 6–20)
CALCIUM: 8.6 mg/dL — AB (ref 8.9–10.3)
CO2: 26 mmol/L (ref 22–32)
CREATININE: 0.75 mg/dL (ref 0.44–1.00)
Chloride: 101 mmol/L (ref 101–111)
Glucose, Bld: 137 mg/dL — ABNORMAL HIGH (ref 65–99)
Potassium: 4 mmol/L (ref 3.5–5.1)
SODIUM: 131 mmol/L — AB (ref 135–145)

## 2015-11-03 NOTE — Progress Notes (Signed)
Patient ID: Lindsay Cobb, female   DOB: 09-05-1941, 74 y.o.   MRN: [id number] Facility; [facility] Chief complaint; wound review, other medical issues History; this is a patient with a known deep stage III [possibly stage IV] wound over her left pelvis and a superficial area over the right pelvis as well. The wound was an infected area that underwent surgical debridement during her last hospitalization. For reasons that are not totally clear she was sent to [facility] on 11/23 with a mildly elevated white count. The patient had just completed Rocephin and Flagyl related to Escherichia coli that was cultured out of the wound during her last hospitalization. A CT scan of the pelvis is was done that did not show underlying osteomyelitis I think ordered by the EDP. This is at least helpful in determining what to do going forward.  CBC Latest Ref Rng 11/01/2015 10/30/2015 10/29/2015  WBC 4.0 - 10.5 K/uL 9.2 12.3(H) 13.7(H)  Hemoglobin 12.0 - 15.0 g/dL 4.0(J) 8.1(X) 9.1(Y)  Hematocrit 36.0 - 46.0 % 28.4(L) 29.7(L) 30.6(L)  Platelets 150 - 400 K/uL 327 383 386   BMP Latest Ref Rng 11/01/2015 10/30/2015 10/30/2015  Glucose 65 - 99 mg/dL 782(N) - 562(Z)  BUN 6 - 20 mg/dL 11 - 14  Creatinine 3.08 - 1.00 mg/dL 6.57 - 8.46  Sodium 962 - 145 mmol/L 133(L) - 129(L)  Potassium 3.5 - 5.1 mmol/L 4.2 5.6(H) 5.8(H)  Chloride 101 - 111 mmol/L 102 - 100(L)  CO2 22 - 32 mmol/L 25 - 25  Calcium 8.9 - 10.3 mg/dL 9.5(M) - 8.9         CLINICAL DATA:  Elevated white count and ulcer to the left buttocks.   EXAM: CT PELVIS WITHOUT CONTRAST   TECHNIQUE: Multidetector CT imaging of the pelvis was performed following the standard protocol without intravenous contrast.   COMPARISON:  Abdominal films 10/14/2015   FINDINGS: There is a deep skin and subcutaneous ulceration over the left sacrum extending nearly to the underlying bone. Infiltration around the tract consistent with cellulitis. No discrete fluid  collection to suggest abscess. The underlying sacrum and pelvis appear intact. No evidence of cortical loss or bone erosion to suggest osteomyelitis. No focal bone sclerosis or expansile bone lesions.   Degenerative changes in the hips. Degenerative changes in the SI joints and lower lumbar spine. No acute fracture or dislocation. No focal bone lesion or bone destruction.   Foley catheter in the bladder. Calcifications in the uterus likely representing fibroid. No free or loculated pelvic fluid collections. No pelvic mass or lymphadenopathy. Appendix is normal. Vascular calcifications in the aorta and iliac arteries.   IMPRESSION: Deep skin and subcutaneous ulceration over the left sacrum. No focal abscess. No bone involvement.     Electronically Signed   By: Burman Nieves M.D.   On: 10/30/2015 23:01          Past Medical History  Diagnosis Date  . Diabetes mellitus     takes Januvia,Metformin,and Glipizide daily  . Hyperlipidemia     takes Lovastatin nightly  . Anemia     takes Ferrous Gluconate daily  . Peripheral edema     takes Furosemide daily as needed  . Depression     takes Tofranil daily  . Hypertension     takes Amlodipine,Atenolol,and Benazepril daily  . Arthritis   . Joint pain   . Joint swelling   . Cataracts, bilateral     immature  . Infection of skin of knee  left  . 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 Breast biopsy Left   . Colonoscopy    . Esophagogastroduodenoscopy    . Ercp    . Patellectomy Left 07/11/2015    Procedure: Left Partial PATELLECTOMY;  Surgeon: Tarry Kos, MD;  [facility];  Service: Orthopedics;  Laterality: Left;  . Irrigation and debridement knee Left 08/15/2015    Procedure: IRRIGATION AND DEBRIDEMENT LEFT KNEE;  Surgeon: Tarry Kos, MD;  [facility];  Service: Orthopedics;  Laterality: Left;  . Patellectomy Left 08/15/2015     Procedure: Revision PATELLECTOMY;  Surgeon: Tarry Kos, MD;  [facility];  Service: Orthopedics;  Laterality: Left;  . I&d extremity Left 08/18/2015    Procedure: IRRIGATION AND DEBRIDEMENT EXTREMITY;  Surgeon: Tarry Kos, MD;  [facility];  Service: Orthopedics;  Laterality: Left;  Marland Kitchen Minor application of wound vac  08/18/2015    Procedure: MINOR APPLICATION OF WOUND VAC;  Surgeon: Tarry Kos, MD;  [facility];  Service: Orthopedics;;  . Irrigation and debridement knee Left 08/20/2015    Procedure: IRRIGATION AND DEBRIDEMENT KNEE WITH PLACEMENT OF INTEGRA;  Surgeon: Tarry Kos, MD;  [facility];  Service: Orthopedics;  Laterality: Left;  . I&d extremity Left 09/10/2015    Procedure: LEFT KNEE SPLIT THICKNESS SKIN GRAFT, IRRIGATION AND DEBRIDEMENT, WOUND VAC;  Surgeon: Tarry Kos, MD;  [facility];  Service: Orthopedics;  Laterality: Left;  . Skin split graft Left 09/10/2015    Procedure: SKIN GRAFT SPLIT THICKNESS;  Surgeon: Tarry Kos, MD;  [facility];  Service: Orthopedics;  Laterality: Left;  . Application of wound vac Left 09/10/2015    Procedure: APPLICATION OF WOUND VAC;  Surgeon: Tarry Kos, MD;  [facility];  Service: Orthopedics;  Laterality: Left;  . I&d extremity Left 09/12/2015    Procedure: IRRIGATION AND DEBRIDEMENT LEFT KNEE WITH PLACEMENT OF WOUND VAC;  Surgeon: Tarry Kos, MD;  [facility];  Service: Orthopedics;  Laterality: Left;  . Amputation Left 09/28/2015    Procedure: AMPUTATION ABOVE KNEE;  Surgeon: Nadara Mustard, MD;  [facility];  Service: Orthopedics;  Laterality: Left;  . Tee without cardioversion N/A 10/02/2015    Procedure: TRANSESOPHAGEAL ECHOCARDIOGRAM (TEE);  Surgeon: Chilton Si, MD;  [facility] ENDOSCOPY;  Service: Cardiovascular;  Laterality: N/A;    Review of systems Gen. patient states she feels reasonably well. Respiratory; no shortness of breath Cardiac no clear chest pain GI I am not really clear  how many bowel movements this lady is having I had some concern about C. difficile when I last saw her on 11/19 although the patient seems to relate that she hasn't had any diarrhea yesterday. GU Foley catheter still in place Extremities; above knee amputation site is well-healed  Physical examination Gen. patient looks well Respiratory clear entry bilaterally Cardiac heart sounds are normal no murmurs Abdomen; mildly distended. Bowel sounds are present in rushes no liver no spleen no overt masses GU no suprapubic or costovertebral angle tenderness Skin;; the left gluteal wound probes superiorly and is precariously close to bone. There is no overt soft tissue infection. On the right gluteal is the more superficial wound although there appears to be surrounding bruising but no tenderness. This would suggest the patient is spending too much time lying on these areas.  Impression/plan #1 deep probing stage 3-4 wound.  There is no underlying osteomyelitis by the CT scan ordered when she went to the emergency room. #2 mildly elevated white count which is already self corrected I don't think any further investigations need to be ordered. #3 question I ileus versus constipation; it is not clear to me that she is having diarrhea #4 original Escherichia coli infection which was treated with Rocephin and Flagyl. These antibiotics have been completed. No further antibiotics are necessary

## 2015-11-04 ENCOUNTER — Encounter: Payer: Self-pay | Admitting: Infectious Disease

## 2015-11-04 LAB — CULTURE, BLOOD (ROUTINE X 2)
CULTURE: NO GROWTH
Culture: NO GROWTH

## 2015-11-04 NOTE — Progress Notes (Addendum)
Patient ID: Lindsay Cobb, female   DOB: [birth year], 74 y.o.   MRN: [id number]                PROGRESS NOTE  DATE:  10/27/2015           FACILITY: [facility]                      LEVEL OF CARE:   SNF   Acute Visit               CHIEF COMPLAINT:  Wound review.      HISTORY OF PRESENT ILLNESS:  This is a patient who came back from the hospital after debridement of an unstageable wound over her left ischial tuberosity.   This was done by General Surgery in the hospital.  Cultures of this grew E.coli.  She is going to be on Rocephin, ending on 10/29/2015.    She came back with recommendations for pulse lavage.  This is not easy to arrange and she would need to be sent out to therapy for this; nor do I actually think this is necessary, although I certainly agree that debridement needs to be done.    Her last albumin level on 10/15/2015 was 1.6. I spent some time talking to her today about intake issues.  Judging by the look of her lunch tray, she is not eating much.    Past Medical History  Diagnosis Date  . Diabetes mellitus     takes Januvia,Metformin,and Glipizide daily  . Hyperlipidemia     takes Lovastatin nightly  . Anemia     takes Ferrous Gluconate daily  . Peripheral edema     takes Furosemide daily as needed  . Depression     takes Tofranil daily  . Hypertension     takes Amlodipine,Atenolol,and Benazepril daily  . Arthritis   . Joint pain   . Joint swelling   . Cataracts, bilateral     immature  . Infection of skin of knee     left  . 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 Breast biopsy Left   . Colonoscopy    . Esophagogastroduodenoscopy    . Ercp    . Patellectomy Left 07/11/2015    Procedure: Left Partial PATELLECTOMY;  Surgeon: Tarry Kos, MD;  [facility];  Service: Orthopedics;  Laterality: Left;  . Irrigation and debridement knee Left 08/15/2015    Procedure:  IRRIGATION AND DEBRIDEMENT LEFT KNEE;  Surgeon: Tarry Kos, MD;  [facility];  Service: Orthopedics;  Laterality: Left;  . Patellectomy Left 08/15/2015    Procedure: Revision PATELLECTOMY;  Surgeon: Tarry Kos, MD;  [facility];  Service: Orthopedics;  Laterality: Left;  . I&d extremity Left 08/18/2015    Procedure: IRRIGATION AND DEBRIDEMENT EXTREMITY;  Surgeon: Tarry Kos, MD;  [facility];  Service: Orthopedics;  Laterality: Left;  Marland Kitchen Minor application of wound vac  08/18/2015    Procedure: MINOR APPLICATION OF WOUND VAC;  Surgeon: Tarry Kos, MD;  [facility];  Service: Orthopedics;;  . Irrigation and debridement knee Left 08/20/2015    Procedure: IRRIGATION AND DEBRIDEMENT KNEE WITH PLACEMENT OF INTEGRA;  Surgeon: Tarry Kos, MD;  [facility];  Service: Orthopedics;  Laterality: Left;  . I&d extremity Left 09/10/2015    Procedure: LEFT KNEE SPLIT THICKNESS SKIN GRAFT, IRRIGATION AND DEBRIDEMENT, WOUND  VAC;  Surgeon: Tarry KosNaiping M Xu, MD;  [facility];  Service: Orthopedics;  Laterality: Left;  . Skin split graft Left 09/10/2015    Procedure: SKIN GRAFT SPLIT THICKNESS;  Surgeon: Tarry KosNaiping M Xu, MD;  [facility];  Service: Orthopedics;  Laterality: Left;  . Application of wound vac Left 09/10/2015    Procedure: APPLICATION OF WOUND VAC;  Surgeon: Tarry KosNaiping M Xu, MD;  [facility];  Service: Orthopedics;  Laterality: Left;  . I&d extremity Left 09/12/2015    Procedure: IRRIGATION AND DEBRIDEMENT LEFT KNEE WITH PLACEMENT OF WOUND VAC;  Surgeon: Tarry KosNaiping M Xu, MD;  [facility];  Service: Orthopedics;  Laterality: Left;  . Amputation Left 09/28/2015    Procedure: AMPUTATION ABOVE KNEE;  Surgeon: Nadara MustardMarcus Duda V, MD;  [facility];  Service: Orthopedics;  Laterality: Left;  . Tee without cardioversion N/A 10/02/2015    Procedure: TRANSESOPHAGEAL ECHOCARDIOGRAM (TEE);  Surgeon: Chilton Siiffany Belfast, MD;  [facility];  Service: Cardiovascular;  Laterality: N/A;     PHYSICAL EXAMINATION:   SKIN:   INSPECTION:  Left gluteal:  She has a large wound probing superiorly to the ischial tuberosity.    PROCEDURE NOTE:  Using a curette, I attempted to remove some of the adherent surface slough at the base of this wound.  I got some of this out, although I more than likely disrupted this enough for Santyl to have some effect.  This is not ready for a wound VAC.    ASSESSMENT/PLAN:            Unstageable wound, left gluteal.   Procedure note above.  Continue the Santyl-based dressings.  I did, as mentioned, spend some time talking to her about nutrition.    Severe protein calorie malnutrition.   She is on nutritional supps plus Pro-Stat.     CPT CODE: [id number]

## 2015-11-10 ENCOUNTER — Non-Acute Institutional Stay (SKILLED_NURSING_FACILITY): Payer: Commercial Managed Care - HMO | Admitting: Internal Medicine

## 2015-11-10 DIAGNOSIS — L89324 Pressure ulcer of left buttock, stage 4: Secondary | ICD-10-CM

## 2015-11-10 DIAGNOSIS — E43 Unspecified severe protein-calorie malnutrition: Secondary | ICD-10-CM | POA: Diagnosis not present

## 2015-11-11 ENCOUNTER — Encounter: Payer: Self-pay | Admitting: Licensed Clinical Social Worker

## 2015-11-11 ENCOUNTER — Other Ambulatory Visit: Payer: Self-pay | Admitting: Licensed Clinical Social Worker

## 2015-11-11 NOTE — Patient Outreach (Signed)
Assessment:  CSW called client phone number on 11/11/15.  CSW spoke via phone with client on 11/11/15. CSW verified identity of client. CSW and Chany spoke of current client needs. Client has been receiving nursing care at [facility].  Client has been participating in scheduled physical therapy sessions for client  at [facility].  Client said that scheduled physical therapy sessions for client were helping client.  Client is cooperating with care providers at [facility]. CSW encouraged client to continue participating in scheduled physical therapy sessions for client at [facility]. CSW also encouraged client to continue cooperating with care providers for client at [facility].  CSW also spoke via phone with Lindsay Cobb, sister of client, on 11/11/15.  CSW verified identity of Lindsay Cobb, sister of client. Client has given permission for [facility] program staff to speak with her sister, Lindsay Cobb about client needs. Lindsay Cobb said that client had involved skin care needs and that client and Lindsay Cobb had met with care plan team at facility on 11/11/15 for client care plan meeting. Lindsay Cobb was encouraged by facility social worker on 11/11/15 to begin [facility] application for client with [place].  CSW Lindsay Cobb spoke with Lindsay Cobb, sister of client, on 11/11/15 about typical documents she would need to bring to a Medicaid application appointment for client with [facility] caseworker.  CSW gave Lindsay Cobb and client Lindsay Cobb phone number of [phone] and encouraged client/Lindsay Cobb to call CSW as needed to discuss social work needs of client. CSW thanked client and Lindsay Cobb for phone conversation with CSW on 11/11/15.  Plan: Client to continue to participate in scheduled physical therapy sessions for client at [facility] facility.   Client to continue to cooperate with care providers for client at [facility]. CSW to call client in three weeks to assess client needs at that time.  Lindsay Cobb.Lindsay Cobb MSW, LCSW Licensed Clinical Social Worker [facility] Care Management [phone]

## 2015-11-12 ENCOUNTER — Non-Acute Institutional Stay (SKILLED_NURSING_FACILITY): Payer: Commercial Managed Care - HMO | Admitting: Internal Medicine

## 2015-11-12 DIAGNOSIS — R634 Abnormal weight loss: Secondary | ICD-10-CM

## 2015-11-12 DIAGNOSIS — E43 Unspecified severe protein-calorie malnutrition: Secondary | ICD-10-CM | POA: Diagnosis not present

## 2015-11-12 NOTE — Progress Notes (Signed)
Patient ID: Nani SkillernHelen M Vanbeek, female   DOB: 07/26/1941, 74 y.o.   MRN: [id number]                PROGRESS NOTE  DATE:  11/10/2015         FACILITY: [facility]                 LEVEL OF CARE:   SNF   Acute Visit                CHIEF COMPLAINT:  Wound review.      HISTORY OF PRESENT ILLNESS:  I follow [name], a lady who has a deep stage III wound over the left pelvis which was a surgical wound, debriding a nonstageable pressure sore.  She has completed antibiotics for her E.coli that cultured out of the wound during her last hospitalization.  A CT scan of the pelvis did not show underlying osteomyelitis.    I had started her on a wound VAC last week.  It is fairly clear to me and the wound care nurse that she is spending too much time sitting up in her wheelchair.  We started the wound VAC on 11/03/2015.    PHYSICAL EXAMINATION:   SKIN:   INSPECTION:  The area over her left gluteal area probes towards the ischial tuberosity.  This comes very close to the bone, although I do not actually feel bone.  There is no evidence of surrounding soft tissue infection.  Over the right gluteal is a more superficial wound which is stage II.    ASSESSMENT/PLAN:           Stage III wound, left gluteal.  We will continue the wound VAC.  I am going to put restrictions on how long this woman can be up out of bed.    I see no current evidence of infection

## 2015-11-13 ENCOUNTER — Inpatient Hospital Stay: Payer: Medicare Other | Admitting: Infectious Disease

## 2015-11-16 NOTE — Progress Notes (Addendum)
Patient ID: Lindsay Cobb, female   DOB: 04/11/1941, 74 y.o.   MRN: [id number]                PROGRESS NOTE  DATE:  11/12/2015         FACILITY: [facility]                       LEVEL OF CARE:   SNF   Acute Visit                   CHIEF COMPLAINT:  Extreme weight loss.      HISTORY OF PRESENT ILLNESS:  I was asked to see Lindsay Cobb today with regards to a startling weight loss.  I actually had not noticed this.  However, since she has come back from her last hospitalization, she has lost 10 pounds in the last five days.  Her current weight as of yesterday is 182 pounds.  On 11/07/2015, this was 192.9.  Her amputation was on 09/28/2015.  According to the nursing home records, on 10/10/2015 she weighed 218 pounds which would mean that she has lost 36 pounds since the beginning of November.    In discussing this with the patient, she talks about having difficulty initiating a swallow but no clear dysphagia after the swallow begins.  She does better with liquids or yogurt consistency foods.  I have spoken to Speech Therapy about this, who is now more on the radar with swallowing issues.    CURRENT MEDICATIONS:  Medication list is reviewed.      REVIEW OF SYSTEMS:    HEENT:   As noted, has difficulty initiating a swallow.   There is no clear pain or odynophagia.   CHEST/RESPIRATORY:  No shortness of breath.   CARDIAC:  No chest pain.   GI:  No nausea, vomiting, or diarrhea.                GU:  No dysuria.    PSYCHIATRIC:  Mental status:   The patient vehemently denies any symptoms or signs of depression.    PHYSICAL EXAMINATION:   GENERAL APPEARANCE:  The patient is not in any distress.             CHEST/RESPIRATORY:  Clear air entry bilaterally.    CARDIOVASCULAR:   CARDIAC:  Heart sounds are normal.  She does not appear dehydrated.      GASTROINTESTINAL:   LIVER/SPLEEN/KIDNEYS:  No liver, no spleen.  No tenderness.    GENITOURINARY:   BLADDER:  No suprapubic or  costovertebral angle tenderness.    PSYCHIATRIC:   MENTAL STATUS:  She does not easily admit to depression.  Speech Therapy reported increasing confusion, although I had not previously recognized this on my bedside assessments.    ASSESSMENT/PLAN:              Extreme weight loss of uncertain etiology.  I am going to check lab work on her.   I have spoken to her about the need to increase her intake.  I have also spoken to Speech Therapy.  I have reviewed her medication list.  I do not see any obvious culprits.  I note that she is on imipramine.  I will need to follow up on this with her or the family.  It is possible she will need an UGI series.

## 2015-11-17 ENCOUNTER — Ambulatory Visit (HOSPITAL_COMMUNITY)
Admission: RE | Admit: 2015-11-17 | Discharge: 2015-11-17 | Disposition: A | Discharge status: Home / Self Care | Payer: Commercial Managed Care - HMO | Source: Ambulatory Visit | Attending: Internal Medicine | Admitting: Internal Medicine

## 2015-11-17 ENCOUNTER — Non-Acute Institutional Stay (SKILLED_NURSING_FACILITY): Payer: Commercial Managed Care - HMO | Admitting: Internal Medicine

## 2015-11-17 ENCOUNTER — Other Ambulatory Visit: Payer: Self-pay | Admitting: Internal Medicine

## 2015-11-17 ENCOUNTER — Encounter (HOSPITAL_COMMUNITY)
Admission: RE | Admit: 2015-11-17 | Discharge: 2015-11-17 | Disposition: A | Discharge status: Home / Self Care | Payer: Commercial Managed Care - HMO | Source: Skilled Nursing Facility | Attending: Internal Medicine | Admitting: Internal Medicine

## 2015-11-17 DIAGNOSIS — D649 Anemia, unspecified: Secondary | ICD-10-CM | POA: Insufficient documentation

## 2015-11-17 DIAGNOSIS — K5649 Other impaction of intestine: Secondary | ICD-10-CM | POA: Insufficient documentation

## 2015-11-17 DIAGNOSIS — R5383 Other fatigue: Secondary | ICD-10-CM | POA: Insufficient documentation

## 2015-11-17 DIAGNOSIS — R140 Abdominal distension (gaseous): Secondary | ICD-10-CM | POA: Diagnosis not present

## 2015-11-17 DIAGNOSIS — E119 Type 2 diabetes mellitus without complications: Secondary | ICD-10-CM | POA: Insufficient documentation

## 2015-11-17 DIAGNOSIS — E871 Hypo-osmolality and hyponatremia: Secondary | ICD-10-CM | POA: Diagnosis not present

## 2015-11-17 DIAGNOSIS — I10 Essential (primary) hypertension: Secondary | ICD-10-CM | POA: Insufficient documentation

## 2015-11-17 DIAGNOSIS — R488 Other symbolic dysfunctions: Secondary | ICD-10-CM | POA: Insufficient documentation

## 2015-11-17 LAB — COMPREHENSIVE METABOLIC PANEL
ALT: 8 U/L — AB (ref 14–54)
ANION GAP: 7 (ref 5–15)
AST: 14 U/L — ABNORMAL LOW (ref 15–41)
Albumin: 2.4 g/dL — ABNORMAL LOW (ref 3.5–5.0)
Alkaline Phosphatase: 74 U/L (ref 38–126)
BUN: 18 mg/dL (ref 6–20)
CALCIUM: 8.9 mg/dL (ref 8.9–10.3)
CHLORIDE: 96 mmol/L — AB (ref 101–111)
CO2: 24 mmol/L (ref 22–32)
CREATININE: 0.81 mg/dL (ref 0.44–1.00)
Glucose, Bld: 120 mg/dL — ABNORMAL HIGH (ref 65–99)
Potassium: 4.8 mmol/L (ref 3.5–5.1)
SODIUM: 127 mmol/L — AB (ref 135–145)
Total Bilirubin: 0.5 mg/dL (ref 0.3–1.2)
Total Protein: 7.3 g/dL (ref 6.5–8.1)

## 2015-11-17 LAB — CBC
HCT: 30.3 % — ABNORMAL LOW (ref 36.0–46.0)
Hemoglobin: 9.7 g/dL — ABNORMAL LOW (ref 12.0–15.0)
MCH: 25.3 pg — AB (ref 26.0–34.0)
MCHC: 32 g/dL (ref 30.0–36.0)
MCV: 79.1 fL (ref 78.0–100.0)
PLATELETS: 461 10*3/uL — AB (ref 150–400)
RBC: 3.83 MIL/uL — AB (ref 3.87–5.11)
RDW: 19.6 % — AB (ref 11.5–15.5)
WBC: 8.8 10*3/uL (ref 4.0–10.5)

## 2015-11-19 ENCOUNTER — Encounter (HOSPITAL_COMMUNITY)
Admission: AD | Admit: 2015-11-19 | Discharge: 2015-11-19 | Disposition: A | Discharge status: Home / Self Care | Payer: Commercial Managed Care - HMO | Source: Skilled Nursing Facility | Attending: Internal Medicine | Admitting: Internal Medicine

## 2015-11-19 LAB — BASIC METABOLIC PANEL
ANION GAP: 5 (ref 5–15)
BUN: 17 mg/dL (ref 6–20)
CO2: 27 mmol/L (ref 22–32)
Calcium: 9.2 mg/dL (ref 8.9–10.3)
Chloride: 98 mmol/L — ABNORMAL LOW (ref 101–111)
Creatinine, Ser: 0.74 mg/dL (ref 0.44–1.00)
GLUCOSE: 143 mg/dL — AB (ref 65–99)
POTASSIUM: 4.8 mmol/L (ref 3.5–5.1)
Sodium: 130 mmol/L — ABNORMAL LOW (ref 135–145)

## 2015-11-21 NOTE — Progress Notes (Addendum)
Patient ID: Lindsay Cobb, female   DOB: 12/13/1940, 74 y.o.   MRN: [id number]                PROGRESS NOTE  DATE:  11/17/2015         FACILITY: [facility]                 LEVEL OF CARE:   SNF   Acute Visit             CHIEF COMPLAINT:  Hyponatremia.     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  This is secondary to an infected left patella fracture site.  She came here with a large wound over the left sacrum that extended down to bone.  This was a surgically debrided wound which I believe cultured E.coli.  I think she has completed her antibiotics here.  She did not have osteomyelitis.    Since these issues, the patient has continued not to do well.  She is not eating.  Staff report that she is increasingly confused, although I do not really have a baseline here.  When she came in, she always seemed fairly cognitively intact to me.    CURRENT MEDICATIONS:  Medication list is reviewed.    Amlodipine 10 q.d.       ASA 325 daily.     Atenolol 50 q.d.      Ferrous sulfate 325 daily.    Imipramine 50 three times a day.    Januvia 25, 1 tablet a day.    Lasix 20 q.d.     Lovastatin 10 q.d.     Metformin 1000 b.i.d.     Oxycodone 5 mg q.4 p.r.n.      Pro-Stat 30 cc b.i.d.      LABORATORY DATA:   Lab work from today:      Sodium 127, potassium 4.8, CO2 of 24, BUN 18, creatinine 0.81.    Albumin 2.4.     Liver function tests normal.     White count 8.8, hemoglobin 9.7 which seems reasonably stable.    REVIEW OF SYSTEMS:   Very difficult from the patient.   CHEST/RESPIRATORY:  She does not complain of shortness of breath or cough.      CARDIAC:  No chest pain.   GI:  No abdominal pain.   No nausea, vomiting, or diarrhea.     PHYSICAL EXAMINATION:   GENITOURINARY:   BLADDER:  She has a Foley catheter in place for wound protection.  There is no obvious CVA tenderness or  suprapubic tenderness.       CIRCULATION:   EDEMA/VARICOSITIES:  Extremities:  No edema.      ASSESSMENT/PLAN:            Hyponatremia.  I think this patient looks volume-contracted.  I am going to discontinue the Lasix.    Ileus and/or impaction.  I am going to do a KUB of her abdomen to rule out impaction.  I am going to reduce her imipramine, which is highly anticholinergic and could be adding to her confusion, although we will need to monitor the depression element of this carefully.

## 2015-11-24 ENCOUNTER — Other Ambulatory Visit: Payer: Self-pay | Admitting: Internal Medicine

## 2015-11-24 ENCOUNTER — Non-Acute Institutional Stay (SKILLED_NURSING_FACILITY): Payer: Commercial Managed Care - HMO | Admitting: Internal Medicine

## 2015-11-24 DIAGNOSIS — E871 Hypo-osmolality and hyponatremia: Secondary | ICD-10-CM

## 2015-11-24 DIAGNOSIS — R1310 Dysphagia, unspecified: Secondary | ICD-10-CM

## 2015-11-24 DIAGNOSIS — L89324 Pressure ulcer of left buttock, stage 4: Secondary | ICD-10-CM | POA: Diagnosis not present

## 2015-11-25 ENCOUNTER — Other Ambulatory Visit: Payer: Self-pay | Admitting: *Deleted

## 2015-11-25 MED ORDER — OXYCODONE HCL 5 MG PO TABS: ORAL_TABLET | ORAL | Status: DC

## 2015-11-25 NOTE — Telephone Encounter (Signed)
[facility] 

## 2015-11-26 ENCOUNTER — Encounter: Payer: Self-pay | Admitting: Internal Medicine

## 2015-11-26 ENCOUNTER — Ambulatory Visit (HOSPITAL_COMMUNITY)
Admission: RE | Admit: 2015-11-26 | Discharge: 2015-11-26 | Disposition: A | Discharge status: Home / Self Care | Payer: Commercial Managed Care - HMO | Source: Ambulatory Visit | Attending: Internal Medicine | Admitting: Internal Medicine

## 2015-11-26 ENCOUNTER — Other Ambulatory Visit: Payer: Self-pay | Admitting: Internal Medicine

## 2015-11-26 ENCOUNTER — Non-Acute Institutional Stay (SKILLED_NURSING_FACILITY): Payer: Commercial Managed Care - HMO | Admitting: Internal Medicine

## 2015-11-26 ENCOUNTER — Encounter (HOSPITAL_COMMUNITY)
Admission: AD | Admit: 2015-11-26 | Discharge: 2015-11-26 | Disposition: A | Discharge status: Home / Self Care | Payer: Commercial Managed Care - HMO | Source: Skilled Nursing Facility | Attending: Internal Medicine | Admitting: Internal Medicine

## 2015-11-26 DIAGNOSIS — R1310 Dysphagia, unspecified: Secondary | ICD-10-CM | POA: Diagnosis present

## 2015-11-26 DIAGNOSIS — E785 Hyperlipidemia, unspecified: Secondary | ICD-10-CM | POA: Insufficient documentation

## 2015-11-26 DIAGNOSIS — E119 Type 2 diabetes mellitus without complications: Secondary | ICD-10-CM | POA: Insufficient documentation

## 2015-11-26 DIAGNOSIS — E875 Hyperkalemia: Secondary | ICD-10-CM

## 2015-11-26 DIAGNOSIS — I10 Essential (primary) hypertension: Secondary | ICD-10-CM | POA: Diagnosis not present

## 2015-11-26 DIAGNOSIS — E871 Hypo-osmolality and hyponatremia: Secondary | ICD-10-CM | POA: Diagnosis not present

## 2015-11-26 LAB — BASIC METABOLIC PANEL
ANION GAP: 9 (ref 5–15)
BUN: 35 mg/dL — AB (ref 6–20)
CALCIUM: 9.3 mg/dL (ref 8.9–10.3)
CO2: 27 mmol/L (ref 22–32)
CREATININE: 1.08 mg/dL — AB (ref 0.44–1.00)
Chloride: 90 mmol/L — ABNORMAL LOW (ref 101–111)
GFR calc Af Amer: 58 mL/min — ABNORMAL LOW (ref >60)
GFR, EST NON AFRICAN AMERICAN: 50 mL/min — AB (ref >60)
GLUCOSE: 173 mg/dL — AB (ref 65–99)
Potassium: 5.2 mmol/L — ABNORMAL HIGH (ref 3.5–5.1)
Sodium: 126 mmol/L — ABNORMAL LOW (ref 135–145)

## 2015-11-26 NOTE — Progress Notes (Signed)
Patient ID: Lindsay Cobb, female   DOB: 07-24-1941, 74 y.o.   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  This is secondary to an infected left patella fracture site.  She came here with a large wound over the left sacrum that extended down to bone.  This was a surgically debrided wound which I believe cultured E.coli.  I she has completed her antibiotics here.  She did not have osteomyelitis.      patient has had some by mouth intake issues and Dr. Leanord Hawking has assessed her for this-she was also found to be somewhat hyponatremic and Dr. Leanord Hawking did recently discontinue her Lasix.  She currently is not on potassium either updated lab done today shows her potassium is minimally elevated at 5.2 sodium is 126 which appears to be on the lower end of her recent baseline.  Currently she has no complaints her vital signs are stable.  Family medical social history is been reviewed per admission note on 10/25/2015.    CURRENT MEDICATIONS:  Medication list is reviewed.    Amlodipine 10 q.d.       ASA 325 daily.     Atenolol 50 q.d.      Ferrous sulfate 325 daily.    Imipramine 50 three times a day.    Januvia 25, 1 tablet a day.      Lovastatin 10 q.d.     Metformin 1000 b.i.d.     Oxycodone 5 mg q.4 p.r.n.      Pro-Stat 30 cc b.i.d.      LABORATORY DATA:     11/26/2015.  Sodium 126 potassium 5.2 BUN 35 creatinine 1.08-.     REVIEW OF SYSTEMS:   Very difficult from the patient.  Gen. no complaints of fever or chills.    CHEST/RESPIRATORY:  She does not complain of shortness of breath or cough.      CARDIAC:  No chest pain.   GI:  No abdominal pain.   No  nausea, vomiting, or diarrhea.   Muscle skeletal is not complaining of joint pain.  Neurologic is not complaining of headache or dizziness   PHYSICAL EXAMINATION Temperature 97.3 pulse 69 respirations 18 blood pressure 100/56.  General this is a pleasant elderly female in no distress lying comfortably in bed  Oropharynx is dry mucous membranes moist.  .  Chest is clear to auscultation with shallow air entry.  Heart is regular rate and rhythm without murmur gallop or rub.  Abdomen is soft does not appear to be tender there are positive bowel sounds.  .      ASSESSMENT/PLAN:              Hyperkalemia-this is quite minimal one would wonder possibly about slight hemolysis-she is not on any Ace or potassium supplement-will update this tomorrow for follow-up clinically she appears to be stable.    Hyponatremia this appears to be relatively baseline Will  update this as well her Lasix has been discontinued.    [id number].

## 2015-11-27 ENCOUNTER — Encounter (HOSPITAL_COMMUNITY)
Admission: AD | Admit: 2015-11-27 | Discharge: 2015-11-27 | Disposition: A | Discharge status: Home / Self Care | Payer: Commercial Managed Care - HMO | Source: Skilled Nursing Facility | Attending: Internal Medicine | Admitting: Internal Medicine

## 2015-11-27 LAB — BASIC METABOLIC PANEL
Anion gap: 9 (ref 5–15)
BUN: 30 mg/dL — ABNORMAL HIGH (ref 6–20)
CO2: 25 mmol/L (ref 22–32)
Calcium: 9.5 mg/dL (ref 8.9–10.3)
Chloride: 94 mmol/L — ABNORMAL LOW (ref 101–111)
Creatinine, Ser: 0.85 mg/dL (ref 0.44–1.00)
GFR calc Af Amer: >60 mL/min (ref >60)
GFR calc non Af Amer: >60 mL/min (ref >60)
Glucose, Bld: 164 mg/dL — ABNORMAL HIGH (ref 65–99)
Potassium: 5 mmol/L (ref 3.5–5.1)
Sodium: 128 mmol/L — ABNORMAL LOW (ref 135–145)

## 2015-11-30 NOTE — Progress Notes (Signed)
Patient ID: Lindsay Cobb, female   DOB: 11/01/1941, 74 y.o.   MRN: [id number]                PROGRESS NOTE  DATE:  11/24/2015         FACILITY: [facility]              LEVEL OF CARE:   SNF   Acute Visit                   CHIEF COMPLAINT:  Follow up multiple medical issues including her bilateral wounds, back pain.     HISTORY OF PRESENT ILLNESS:  This is a patient whom I have been following for a deep postsurgical wound which was initially infected over her left gluteal.  She also has a wound over her right gluteal area.    I saw her last week for hyponatremia, stopped her Lasix.  Increasing confusion, as well, and I reduced her imipramine as well as trying to address her hyponatremia.    Apparently, she complained of excruciating low back pain last week, making it difficult for the nurses to turn her over in bed, although I can get her to say nothing about this today.    Looking at her weights, her weight is up to 177.3 pounds, which is stable over the last week but markedly down over the last two weeks.       LABORATORY DATA:   Last lab work from 11/19/2015:     Sodium 130, potassium 4.8, BUN 17, creatinine 0.74.     PHYSICAL EXAMINATION:   GENERAL APPEARANCE:  The patient is not in any distress.          CHEST/RESPIRATORY:  Clear air entry bilaterally.    CARDIOVASCULAR:   CARDIAC:  She still looks to be somewhat volume-contracted.   Otherwise, cardiac exam is normal.    GASTROINTESTINAL:   ABDOMEN:  Slightly distended.   Bowel sounds are present.   LIVER/SPLEEN/KIDNEYS:  No liver, no spleen.  No tenderness.     SKIN:   INSPECTION:  Wound exam:   Over the left gluteal, the wound looks as though it is contracting.  This is a deep stage IV wound that was initially an infected decubitus ulcer.  Her area over the right gluteal is more superficial, covered in an eschar, and Santyl is an appropriate dressing.   MUSCULOSKELETAL:   BACK:  There is no focal tenderness  in her back.   GENITOURINARY:  No clear CVA tenderness or anything from a GU point of view that could mimic musculoskeletal back pain.     ASSESSMENT/PLAN:                   Hyponatremia.  Seems to be improving off the Lasix.  I will recheck this on Wednesday.    Bilateral gluteal wounds, as noted.  Both of these appear to be making progress, certainly not deteriorating.    Low back pain last week.  I cannot even get her to talk about this today.  Hopefully, this is stable.  I could find nothing of relevance here.    Type 2 diabetes.  I would like to see her blood sugars on Januvia and metformin.     CPT CODE: [id number]            ADDENDUM:  She was supposed to go for a swallowing study and upper GI series, although I do not see those results.  I will try to check this next time.

## 2015-12-01 ENCOUNTER — Encounter (HOSPITAL_COMMUNITY)
Admission: RE | Admit: 2015-12-01 | Discharge: 2015-12-01 | Disposition: A | Discharge status: Home / Self Care | Payer: Commercial Managed Care - HMO | Source: Skilled Nursing Facility | Attending: Internal Medicine | Admitting: Internal Medicine

## 2015-12-01 ENCOUNTER — Non-Acute Institutional Stay (SKILLED_NURSING_FACILITY): Payer: Commercial Managed Care - HMO | Admitting: Internal Medicine

## 2015-12-01 DIAGNOSIS — L89324 Pressure ulcer of left buttock, stage 4: Secondary | ICD-10-CM

## 2015-12-01 DIAGNOSIS — R634 Abnormal weight loss: Secondary | ICD-10-CM

## 2015-12-01 DIAGNOSIS — E871 Hypo-osmolality and hyponatremia: Secondary | ICD-10-CM

## 2015-12-01 DIAGNOSIS — F05 Delirium due to known physiological condition: Secondary | ICD-10-CM

## 2015-12-01 DIAGNOSIS — R410 Disorientation, unspecified: Secondary | ICD-10-CM

## 2015-12-01 DIAGNOSIS — F332 Major depressive disorder, recurrent severe without psychotic features: Secondary | ICD-10-CM | POA: Diagnosis not present

## 2015-12-01 LAB — OSMOLALITY, URINE: OSMOLALITY UR: 510 mosm/kg (ref 300–900)

## 2015-12-01 NOTE — Progress Notes (Signed)
Patient ID: Lindsay Cobb, female   DOB: 10/12/1941, 74 y.o.   MRN: [id number]    Facility; [facility] Chief complaint; review of declining oral intake other issues. History; I continue to see this patient often due to a complicated decline in her status. Briefly she came to was after fracturing her left patella which subsequently became infected. 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 The amputation was in October I believe. She was readmitted to hospital from 11/8 through 11/18with an infected decubitus ulcer over her eft gluteal area. She has an unstageable area over the right gluteal. The patient was taken to the OR for surgical debridement of the left gluteal ulcer. She completed a course of antibiotics for an Escherichia coli infection. We have had her on a wound VAC for several weeks now and we have had some improvement in this large surgical wound.  Over the last week due to I have become more aware of the patient not eating. She was able to 1.2 described a difficult sensation in her midesophagus.She has been followed carefully by speech therapy in the facility who tell me today that she will take liquids better than solids but mostly refusesto eat. 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 But no gross obstruction with the limitations that the patient could not be properly positioned, etc. Etc. She did not swallow enough contrast the really see the stomach properly.  Finally I have heard from multiple staff membersthat the patient is more confusedMy sense of this patient that when she first came into the building in the middle of the summer she was quite cognitively intact. I am now wondering whether that was correct. In any case she seems depressed, confused  Current medications Oxycodone 5 mg every 4 hours when  necessary Imipramine 25 twice a daybrackets I have been tapering this wondering if this was inhibiting gastric or GI motility] Lovastatin 10 mg daily Metformin1 tablet twice a day 1000 mg Amlodipine 10 mg daily Enteric-coated aspirin 325 daily Atenolol 50 mg daily Ferrous sulfate 325 daily Januvia25 daily         Study Result       CLINICAL DATA:  Dysphagia, diabetes mellitus, hypertension, hyperlipidemia, post above-the-knee amputation   EXAM: UPPER GI SERIES WITHOUT KUB   TECHNIQUE: Routine upper GI series was performed with thin barium. Examination limited by esophageal motility and patient's inability to stand/roll into required positions for imaging.   FLUOROSCOPY TIME:  Radiation Exposure Index (as provided by the fluoroscopic device): Not provided   If the device does not provide the exposure index:   Fluoroscopy Time (in minutes and seconds):  2 minutes 54 seconds   Number of Acquired Images:  2   COMPARISON:  Abdominal radiograph 11/17/2015   FINDINGS: Gaseous distention of the proximal colon with gas and stool throughout remainder of colon.   Overall bowel gas pattern is nonobstructive.   No bowel wall thickening.   Grossly normal esophageal distention.   No definite mass or stricture identified.   No persistent intraluminal filling defects.   Esophageal walls appear smooth.   Markedly impaired esophageal motility with prolonged retention of contrast within the thoracic esophagus.   Very poor primary peristaltic and secondary peristaltic waves are identified.   Intermittent retrograde peristalsis noted.   Clearance is facilitated  by elevating the head of the bed and the utilization of gravity, though this was limited by patient's amputation.   Clearance of contrast from the esophagus appears to be heavily dependent on gravity.   Only minimal distention of the stomach is identified due to inability to get significant contrast into the  stomach.   No definite gastric outlet obstruction, gross gastric mass or ulceration identified.   Pill esophagram was not performed due to patient condition.   Unable to adequately position for targeted rapid sequence imaging of the cervical esophagus and hypopharynx.   IMPRESSION: Significantly limited exam due to a combination of patient condition, with limited positioning, and severe diffuse impairment of esophageal motility as above.   No gross evidence of mass or obstruction on limited exam.   If better visualization of the upper GI tract is required, recommend endoscopy.     Electronically Signed   By: Lavonia Dana M.D.   On: 11/26/2015 16:18      Social; the patient I believe was living with her husband. I have not met himnevertheless staff tell me that he appears frail himselfShe has a sister who speaks on her behalf although the patient at one point was her own responsible party She remains a full code  indicated that her mother is deceased. She indicated that her father is deceased.   Review of systems Gen.; this is not possible from the patient.  Most of the information I have is from discussing things with the speech therapist's  Physical examination Gen.; the patient is obviously lost weight Respiratory; shallow but otherwise clear entry bilaterally Cardiac she does not look to be dehydrated Abdomen; no liver no spleen no masses bowel sounds are positive there is no tenderness GU Foley catheter in place. Skin; the wound in her left gluteal has contracted although there is considerable depth. No obvious evidence of infection. The area on the right is unstageable and will need to continue Santyl. Mental status; she appears to be depressedAsking her cognitive questions is difficult I could not get her to answer the year or the month. She was able to toe me that she first came in here with a fractured knee cap.  Impression/plan #1 severe weight loss. The cause of  this is not clear. She probably needs an endoscopy and will need to see GI. I think consideration should be given to a PEG tubehowever I do not think the patient is currently competent to make this decision. I've arranged for a meeting with her sister and her husband when I'm next in the building on Wednesday 12/28 #2 decubitus ulcers; these are stable to improved, I don't believe that the left decubitus ulcer as d her delirium #3deliriumquestion hyponatremia #4 hyponatremia he appears to be euvolemic #5 depression;? The major cause of all of this?I had been reducing her imipramine as a possible cause of impaired gastric motility and confusion however I now wonder whether I have unroofed major depression  BMP Latest Ref Rng 11/27/2015 11/26/2015 11/19/2015  Glucose 65 - 99 mg/dL 164(H) 173(H) 143(H)  BUN 6 - 20 mg/dL 30(H) 35(H) 17  Creatinine 0.44 - 1.00 mg/dL 0.85 1.08(H) 0.74  Sodium 135 - 145 mmol/L 128(L) 126(L) 130(L)  Potassium 3.5 - 5.1 mmol/L 5.0 5.2(H) 4.8  Chloride 101 - 111 mmol/L 94(L) 90(L) 98(L)  CO2 22 - 32 mmol/L _0 Calcium 8.9 - 10.3 mg/dL 9.5 9.3 9.2   CBC Latest Ref Rng 11/17/2015 11/03/2015 11/01/2015  WBC 4.0 -  10.5 K/uL 8.8 8.2 9.2  Hemoglobin 12.0 - 15.0 g/dL 9.7(L) 9.2(L) 8.9(L)  Hematocrit 36.0 - 46.0 % 30.3(L) 28.6(L) 28.4(L)  Platelets 150 - 400 K/uL 461(H) 373 327     Meeting scheduled for Wednesday. I will see how aggressive to be in maintaining this patient.Whether she had mild background dementia when she first came in here or not there has been a drastic change in this woman's mental status. The aggressiveness of our support for this patient will be determined I hope in 48 hours time.

## 2015-12-02 ENCOUNTER — Encounter: Payer: Self-pay | Admitting: Licensed Clinical Social Worker

## 2015-12-02 ENCOUNTER — Other Ambulatory Visit: Payer: Self-pay | Admitting: Licensed Clinical Social Worker

## 2015-12-02 NOTE — Patient Outreach (Signed)
Assessment:  CSW called client contact phone number on 12/02/15. CSW spoke via phone with Coy Saunasosemary, Charity fundraiserN at [facility], on 12/02/15. CSW verified identity of Coy SaunasRosemary, Charity fundraiserN at [facility]. CSW introduced self to MarydelRosemary and spoke with Coy Saunasosemary about client needs. Coy SaunasRosemary informed CSW that client had lost a significant amount of weight in recent weeks. She said client had lost 7 pounds of weight just in the past week. She said that Dr. Gordy Levanobeson, facility medical  doctor, is scheduled to meet with Mr. [name] and sister of client on 12/03/15 to discuss current medical needs of client. Coy SaunasRosemary said that she would also be present at family meeting with Dr. Gordy Levanobeson on 12/03/15.  Coy SaunasRosemary said that both a speech therapist and nutritional representative of facility would also attend family meeting with Dr. Gordy Levanobeson for client  on 12/03/15. Coy SaunasRosemary said client had been refusing to eat and has been refusing some of her medications.  She said that Dr. Gordy Levanobeson may talk with family members of client  about possible use of a feeding tube for client at family meeting on 12/03/15. Coy SaunasRosemary said client had skin integrity care issues with a wound area on her left leg (she had previously had an above the knee amputation on left leg). Client also has skin issues related to area on her sacrum . Coy SaunasRosemary said client is not communicating very well at present and has some cognitive issues.  Dr. Gordy Levanobeson has mentioned possible depression issue for client.  CSW thanked [place] Creek[place]mary for information related to current status of client. CSW invited Coy Saunasosemary, Charity fundraiserN at [facility], to call CSW at [phone] as needed to discuss social work needs of client.  Plan: Client to continue to participate in scheduled physical [facility]. CSW to call client in three weeks to assess needs of client at that time.   Kelton PillarMichael S.Dhani Imel MSW, LCSW Licensed Clinical Social Worker [facility] [phone]

## 2015-12-03 ENCOUNTER — Non-Acute Institutional Stay (SKILLED_NURSING_FACILITY): Payer: Commercial Managed Care - HMO | Admitting: Internal Medicine

## 2015-12-03 ENCOUNTER — Encounter (HOSPITAL_COMMUNITY)
Admission: AD | Admit: 2015-12-03 | Discharge: 2015-12-03 | Disposition: A | Discharge status: Home / Self Care | Payer: Commercial Managed Care - HMO | Source: Skilled Nursing Facility | Attending: Emergency Medicine | Admitting: Emergency Medicine

## 2015-12-03 DIAGNOSIS — E43 Unspecified severe protein-calorie malnutrition: Secondary | ICD-10-CM

## 2015-12-03 DIAGNOSIS — F05 Delirium due to known physiological condition: Secondary | ICD-10-CM

## 2015-12-03 DIAGNOSIS — F332 Major depressive disorder, recurrent severe without psychotic features: Secondary | ICD-10-CM | POA: Diagnosis not present

## 2015-12-03 DIAGNOSIS — R410 Disorientation, unspecified: Secondary | ICD-10-CM

## 2015-12-03 DIAGNOSIS — E871 Hypo-osmolality and hyponatremia: Secondary | ICD-10-CM | POA: Diagnosis not present

## 2015-12-03 LAB — BASIC METABOLIC PANEL
ANION GAP: 8 (ref 5–15)
BUN: 29 mg/dL — ABNORMAL HIGH (ref 6–20)
CHLORIDE: 96 mmol/L — AB (ref 101–111)
CO2: 24 mmol/L (ref 22–32)
Calcium: 9.4 mg/dL (ref 8.9–10.3)
Creatinine, Ser: 0.72 mg/dL (ref 0.44–1.00)
Glucose, Bld: 163 mg/dL — ABNORMAL HIGH (ref 65–99)
POTASSIUM: 5.2 mmol/L — AB (ref 3.5–5.1)
SODIUM: 128 mmol/L — AB (ref 135–145)

## 2015-12-03 LAB — OSMOLALITY: OSMOLALITY: 282 mosm/kg (ref 275–295)

## 2015-12-04 ENCOUNTER — Encounter (HOSPITAL_COMMUNITY)
Admission: AD | Admit: 2015-12-04 | Discharge: 2015-12-04 | Disposition: A | Discharge status: Home / Self Care | Payer: Commercial Managed Care - HMO | Source: Skilled Nursing Facility | Attending: Internal Medicine | Admitting: Internal Medicine

## 2015-12-04 DIAGNOSIS — I10 Essential (primary) hypertension: Secondary | ICD-10-CM | POA: Diagnosis not present

## 2015-12-04 DIAGNOSIS — R5383 Other fatigue: Secondary | ICD-10-CM | POA: Diagnosis not present

## 2015-12-04 DIAGNOSIS — D649 Anemia, unspecified: Secondary | ICD-10-CM | POA: Diagnosis present

## 2015-12-04 DIAGNOSIS — E119 Type 2 diabetes mellitus without complications: Secondary | ICD-10-CM | POA: Diagnosis present

## 2015-12-04 DIAGNOSIS — R488 Other symbolic dysfunctions: Secondary | ICD-10-CM | POA: Diagnosis present

## 2015-12-04 LAB — BASIC METABOLIC PANEL
Anion gap: 10 (ref 5–15)
BUN: 22 mg/dL — AB (ref 6–20)
CO2: 22 mmol/L (ref 22–32)
Calcium: 9.4 mg/dL (ref 8.9–10.3)
Chloride: 99 mmol/L — ABNORMAL LOW (ref 101–111)
Creatinine, Ser: 0.66 mg/dL (ref 0.44–1.00)
GFR calc Af Amer: >60 mL/min (ref >60)
GLUCOSE: 148 mg/dL — AB (ref 65–99)
POTASSIUM: 4.8 mmol/L (ref 3.5–5.1)
Sodium: 131 mmol/L — ABNORMAL LOW (ref 135–145)

## 2015-12-05 ENCOUNTER — Encounter (HOSPITAL_COMMUNITY)
Admission: AD | Admit: 2015-12-05 | Discharge: 2015-12-05 | Disposition: A | Discharge status: Home / Self Care | Payer: Commercial Managed Care - HMO | Source: Skilled Nursing Facility | Attending: Internal Medicine | Admitting: Internal Medicine

## 2015-12-05 ENCOUNTER — Other Ambulatory Visit: Payer: Self-pay | Admitting: *Deleted

## 2015-12-05 DIAGNOSIS — E119 Type 2 diabetes mellitus without complications: Secondary | ICD-10-CM | POA: Diagnosis not present

## 2015-12-05 LAB — TSH: TSH: 1.279 u[IU]/mL (ref 0.350–4.500)

## 2015-12-05 MED ORDER — OXYCODONE HCL 5 MG PO TABS: ORAL_TABLET | ORAL | Status: AC

## 2015-12-05 NOTE — Telephone Encounter (Signed)
[facility]

## 2015-12-06 ENCOUNTER — Other Ambulatory Visit (HOSPITAL_COMMUNITY)
Admission: RE | Admit: 2015-12-06 | Discharge: 2015-12-06 | Disposition: A | Discharge status: Home / Self Care | Payer: Commercial Managed Care - HMO | Source: Skilled Nursing Facility | Attending: Internal Medicine | Admitting: Internal Medicine

## 2015-12-06 DIAGNOSIS — E119 Type 2 diabetes mellitus without complications: Secondary | ICD-10-CM | POA: Diagnosis present

## 2015-12-06 LAB — BASIC METABOLIC PANEL
Anion gap: 9 (ref 5–15)
BUN: 16 mg/dL (ref 6–20)
CHLORIDE: 101 mmol/L (ref 101–111)
CO2: 21 mmol/L — AB (ref 22–32)
CREATININE: 0.79 mg/dL (ref 0.44–1.00)
Calcium: 9.6 mg/dL (ref 8.9–10.3)
GFR calc Af Amer: >60 mL/min (ref >60)
GFR calc non Af Amer: >60 mL/min (ref >60)
GLUCOSE: 121 mg/dL — AB (ref 65–99)
POTASSIUM: 4.9 mmol/L (ref 3.5–5.1)
Sodium: 131 mmol/L — ABNORMAL LOW (ref 135–145)

## 2015-12-08 NOTE — Progress Notes (Addendum)
Patient ID: Lindsay Cobb, female   DOB: 04-Apr-1941, 75 y.o.   MRN: [id number]                PROGRESS NOTE  DATE:  12/03/2015            FACILITY: [facility]                      LEVEL OF CARE:   SNF   Acute Visit            CHIEF COMPLAINT:  Continued review of declining oral intake, increasing confusion, etc.    HISTORY OF PRESENT ILLNESS:  Reference my note of 12/01/2015.  I am going to have a family meeting today to discuss the aggressiveness of this patient's support and further work-up.    She has had a declining mental status.  There is not universal agreement about the status she had when she first came into the building in October.  By my recollection, I thought her cognition was normal.   She gave her own history, although most other staff members feel that there was some degree of impairment.  In any case, it was certainly not at these levels.  I spoke to Social Work about the [employer].  All of her scores were 10/15.  Unfortunately, there was not one done in October.    She continues to have a very marginal oral intake.  Speech Therapy states that she will eat things she likes, but spit out anything of higher consistency.  From 11/25/2015, she has been reasonably stable at 172 pounds.  However, she has lost over 20 pounds in the course of this month.    PAST MEDICAL HISTORY/PROBLEM LIST:   Reviewed.      PAST SURGICAL HISTORY:   Reviewed.       CURRENT MEDICATIONS:  Medication list is reviewed.               ASA 325 q.d.     Atenolol 50 q.d.     Ferrous sulfate 325 daily.    Januvia 25 daily.     Metformin 1000 b.i.d.      Lovastatin 10 mg daily.     Imipramine 50 mg b.i.d.      LABORATORY DATA:   Lab work from today:      Sodium 128, potassium 5.2, CO2 24, BUN 29, creatinine 0.72.    Urine osmolality 510.  I do not have a serum osmolality.    REVIEW OF SYSTEMS:   Not really meaningful from the patient in this state.    PHYSICAL EXAMINATION:     GENERAL APPEARANCE:  Once again, the patient speaks in a whiny, high-pitched voice.   CHEST/RESPIRATORY:  Clear air entry bilaterally.    CARDIOVASCULAR:   CARDIAC:  Heart sounds are normal.  There are no murmurs.    LYMPHATICS:  None palpable in the cervical, clavicular, or axillary areas.   BREASTS:  No obvious breast mass.   GASTROINTESTINAL:   LIVER/SPLEEN/KIDNEYS:  No liver, no spleen.  No tenderness.       GENITOURINARY:   BLADDER:  No suprapubic or costovertebral angle tenderness.    CIRCULATION:   EDEMA/VARICOSITIES:  Extremities:  No evidence of a DVT in the right leg.   MUSCULOSKELETAL:   EXTREMITIES:   LEFT LOWER EXTREMITY:  Left AKA site looks stable.    PSYCHIATRIC:   MENTAL STATUS:  Again, speaks in a whiny, high-pitched voice.  She  certainly looks depressed, although she denies this.    ASSESSMENT/PLAN:            Failure to thrive with significant weight loss.  Per the staff, she is eating poorly, virtually nothing.  I am going to meet with the family today to discuss placement of a PEG tube.    I am going to ask for a psych consult, ?depression versus delirium.    Hyponatremia.  She is probably somewhat volume-contracted.   The marginally elevated potassium made me wonder about adrenal insufficiency.  However, I am going to give her 2 L of fluid and see if this will result in improvement.     Dysphagia.  I did an upper GI series that showed significant problems with esophageal peristalsis.  It was a poor study.  I think she probably deserves an endoscopy, as well.    ?Delirium.  Consider a CT scan of the head.  The cause of this is not clear.    Imipramine.  The patient was on a fairly high dose of imipramine when she came in.  Out of concern for her declining mental status, I tried to taper this down, although I wonder if I have unroofed depression.  I increased this from 25 b.i.d. to 50 b.i.d. on 12/01/2015.     C

## 2015-12-10 ENCOUNTER — Non-Acute Institutional Stay (SKILLED_NURSING_FACILITY): Payer: Commercial Managed Care - HMO | Admitting: Internal Medicine

## 2015-12-10 DIAGNOSIS — F332 Major depressive disorder, recurrent severe without psychotic features: Secondary | ICD-10-CM

## 2015-12-10 DIAGNOSIS — R410 Disorientation, unspecified: Secondary | ICD-10-CM

## 2015-12-10 DIAGNOSIS — F05 Delirium due to known physiological condition: Secondary | ICD-10-CM | POA: Diagnosis not present

## 2015-12-10 DIAGNOSIS — E43 Unspecified severe protein-calorie malnutrition: Secondary | ICD-10-CM | POA: Diagnosis not present

## 2015-12-10 DIAGNOSIS — R634 Abnormal weight loss: Secondary | ICD-10-CM

## 2015-12-10 NOTE — Progress Notes (Signed)
Patient ID: Lindsay Cobb, female   DOB: [birth year], 75 y.o.   MRN: [id number] Facility; [facility] Chief complaint; follow-up weight loss, poor by mouth intake History; I'm seeing Lindsay Cobb in follow-up for her visit last week. Apparently her family has agreed to the PEG tube placement for profound weight loss. The cause of this is not really been clear. I have thought that most of this was probably depression and/or delirium related. And I still think this is the case. There seemed to be a lot of upper GI issues. I did a swallowing study that did not show an esophageal obstruction. Nevertheless I have felt that she needs a endoscopy, I put in this order some weeks ago I'm not sure where we are with this I think currently this could go ahead with a diagnostic endoscopy as well as PEG tube placement. GI is in agreement. Nothing much is changed she is eating very poorly. Weight loss continues most recently at 165.6 pounds this is down from 170.2 pounds on 11/3180.3 pounds on 12/5 and 255.2 pounds on 10/14. Her oral intake monitoring reveals none of many meals 1-25% of the rest. I don't think her family has been able to do much better with food they bring in from the outside. I note that she has been seen by psychiatry and ordered Abilify 1 mg permit L as a depression adjuvant. She is on imipramine. With her ice to the imipramine she came in on this. As I begin to receive reports of increasing confusion I considered the anticholinergic effects of imipramine as being a possible cause and I began to taper this. I wonder if I have been the cause of precipitating a major depression in doing so.. I believe she was on 100 mg 3 times a day at one point.  Past Medical History  Diagnosis Date  . Diabetes mellitus     takes Januvia,Metformin,and Glipizide daily  . Hyperlipidemia     takes Lovastatin nightly  . Anemia     takes Ferrous Gluconate daily  . Peripheral edema     takes Furosemide daily as needed  .  Depression     takes Tofranil daily  . Hypertension     takes Amlodipine,Atenolol,and Benazepril daily  . Arthritis   . Joint pain   . Joint swelling   . Cataracts, bilateral     immature  . Infection of skin of knee     left  . 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 Breast biopsy Left   . Colonoscopy    . Esophagogastroduodenoscopy    . Ercp    . Patellectomy Left 07/11/2015    Procedure: Left Partial PATELLECTOMY;  Surgeon: Tarry Kos, MD;  Location: [facility] OR;  Service: Orthopedics;  Laterality: Left;  . Irrigation and debridement knee Left 08/15/2015    Procedure: IRRIGATION AND DEBRIDEMENT LEFT KNEE;  Surgeon: Tarry Kos, MD;  Location: [facility] OR;  Service: Orthopedics;  Laterality: Left;  . Patellectomy Left 08/15/2015    Procedure: Revision PATELLECTOMY;  Surgeon: Tarry Kos, MD;  Location: [facility] OR;  Service: Orthopedics;  Laterality: Left;  . I&d extremity Left 08/18/2015    Procedure: IRRIGATION AND DEBRIDEMENT EXTREMITY;  Surgeon: Tarry Kos, MD;  Location: [facility] OR;  Service: Orthopedics;  Laterality: Left;  Marland Kitchen Minor application of wound vac  08/18/2015    Procedure: MINOR APPLICATION OF WOUND VAC;  Surgeon: Edwin Cap  Roda ShuttersXu, MD;  [facility];  Service: Orthopedics;;  . Irrigation and debridement knee Left 08/20/2015    Procedure: IRRIGATION AND DEBRIDEMENT KNEE WITH PLACEMENT OF INTEGRA;  Surgeon: Tarry KosNaiping M Xu, MD;  Location: [facility] OR;  Service: Orthopedics;  Laterality: Left;  . I&d extremity Left 09/10/2015    Procedure: LEFT KNEE SPLIT THICKNESS SKIN GRAFT, IRRIGATION AND DEBRIDEMENT, WOUND VAC;  Surgeon: Tarry KosNaiping M Xu, MD;  Location: [facility] OR;  Service: Orthopedics;  Laterality: Left;  . Skin split graft Left 09/10/2015    Procedure: SKIN GRAFT SPLIT THICKNESS;  Surgeon: Tarry KosNaiping M Xu, MD;  Location: [facility] OR;  Service: Orthopedics;  Laterality: Left;  . Application of wound vac Left 09/10/2015    Procedure: APPLICATION OF  WOUND VAC;  Surgeon: Tarry KosNaiping M Xu, MD;  Location: [facility] OR;  Service: Orthopedics;  Laterality: Left;  . I&d extremity Left 09/12/2015    Procedure: IRRIGATION AND DEBRIDEMENT LEFT KNEE WITH PLACEMENT OF WOUND VAC;  Surgeon: Tarry KosNaiping M Xu, MD;  Location: [facility];  Service: Orthopedics;  Laterality: Left;  . Amputation Left 09/28/2015    Procedure: AMPUTATION ABOVE KNEE;  Surgeon: Nadara MustardMarcus Duda V, MD;  Location: [facility] OR;  Service: Orthopedics;  Laterality: Left;  . Tee without cardioversion N/A 10/02/2015    Procedure: TRANSESOPHAGEAL ECHOCARDIOGRAM (TEE);  Surgeon: Chilton Siiffany , MD;  [facility];  Service: Cardiovascular;  Laterality: N/A;    Current medications Imipramine 50 mg bid Amlodipine 10 daily Aspirin 325 once a day Atenolol 50 daily Iron 325 once a day Januvia 25 once a day Metformin 1000 twice a day    Review of systems; almost impossible in this patient speaking in a squeaky high-pitched voice.  Physical examination Gen. the patient is visibly more may see dictated Respiratory clear entry bilaterally Cardiac she does not appear to be dehydrated Abdomen no liver spleen or mass GU no bladder distention Mental status; she vehemently denies symptoms of depression however she certainly looks the part  Impression/plan #1 major depression plus or minus concomitant subacute delirium. Now beginning to favor the former. I don't disagree with the Abilify I am going to increase her imipramine from 50-75 twice a day. I'm going to start her on a proton pump inhibitor. I'm going to do a brief abdominal screen for another medical source of this weight loss #2 profound weight loss see references to this above #3 I tried to feed her myself today; using applesauce and a teaspoon. First of all it was a fight to get her to take anything. She became increasingly agitated and then vomited. #4 left greater than right gluteal pressure ulcers. I did not see these today although these are not  going to heal without attention to her nutritional parameters  BMP Latest Ref Rng 12/06/2015 12/04/2015 12/03/2015  Glucose 65 - 99 mg/dL 161(W121(H) 960(A148(H) 540(J163(H)  BUN 6 - 20 mg/dL 16 81(X22(H) 91(Y29(H)  Creatinine 0.44 - 1.00 mg/dL 7.820.79 9.560.66 2.130.72  Sodium 135 - 145 mmol/L 131(L) 131(L) 128(L)  Potassium 3.5 - 5.1 mmol/L 4.9 4.8 5.2(H)  Chloride 101 - 111 mmol/L 101 99(L) 96(L)  CO2 22 - 32 mmol/L 21(L) 22 24  Calcium 8.9 - 10.3 mg/dL 9.6 9.4 9.4   CBC Latest Ref Rng 11/17/2015 11/03/2015 11/01/2015  WBC 4.0 - 10.5 K/uL 8.8 8.2 9.2  Hemoglobin 12.0 - 15.0 g/dL 0.8(M9.7(L) 5.7(Q9.2(L) 8.9(L)  Hematocrit 36.0 - 46.0 % 30.3(L) 28.6(L) 28.4(L)  Platelets 150 - 400 K/uL 461(H) 373 327     #5

## 2015-12-11 ENCOUNTER — Other Ambulatory Visit: Payer: Self-pay | Admitting: Internal Medicine

## 2015-12-11 DIAGNOSIS — R634 Abnormal weight loss: Secondary | ICD-10-CM

## 2015-12-12 ENCOUNTER — Ambulatory Visit (HOSPITAL_COMMUNITY)
Admission: RE | Admit: 2015-12-12 | Discharge: 2015-12-12 | Disposition: A | Discharge status: Home / Self Care | Payer: Commercial Managed Care - HMO | Source: Ambulatory Visit | Attending: Internal Medicine | Admitting: Internal Medicine

## 2015-12-12 DIAGNOSIS — R937 Abnormal findings on diagnostic imaging of other parts of musculoskeletal system: Secondary | ICD-10-CM | POA: Insufficient documentation

## 2015-12-12 DIAGNOSIS — R634 Abnormal weight loss: Secondary | ICD-10-CM | POA: Diagnosis not present

## 2015-12-15 ENCOUNTER — Other Ambulatory Visit (HOSPITAL_COMMUNITY)
Admission: AD | Admit: 2015-12-15 | Discharge: 2015-12-15 | Disposition: A | Discharge status: Home / Self Care | Payer: Commercial Managed Care - HMO | Source: Skilled Nursing Facility | Attending: Internal Medicine | Admitting: Internal Medicine

## 2015-12-15 DIAGNOSIS — B9562 Methicillin resistant Staphylococcus aureus infection as the cause of diseases classified elsewhere: Secondary | ICD-10-CM | POA: Diagnosis present

## 2015-12-16 ENCOUNTER — Encounter: Payer: Self-pay | Admitting: Gastroenterology

## 2015-12-16 ENCOUNTER — Ambulatory Visit: Payer: Commercial Managed Care - HMO | Admitting: Gastroenterology

## 2015-12-16 ENCOUNTER — Ambulatory Visit (INDEPENDENT_AMBULATORY_CARE_PROVIDER_SITE_OTHER): Payer: Commercial Managed Care - HMO | Admitting: Gastroenterology

## 2015-12-16 ENCOUNTER — Other Ambulatory Visit (HOSPITAL_COMMUNITY)
Admission: RE | Admit: 2015-12-16 | Discharge: 2015-12-16 | Disposition: A | Discharge status: Home / Self Care | Payer: Commercial Managed Care - HMO | Source: Skilled Nursing Facility | Attending: Internal Medicine | Admitting: Internal Medicine

## 2015-12-16 VITALS — BP 103/65 | HR 90 | Temp 97.8°F

## 2015-12-16 DIAGNOSIS — R634 Abnormal weight loss: Secondary | ICD-10-CM

## 2015-12-16 DIAGNOSIS — K224 Dyskinesia of esophagus: Secondary | ICD-10-CM | POA: Diagnosis not present

## 2015-12-16 DIAGNOSIS — E119 Type 2 diabetes mellitus without complications: Secondary | ICD-10-CM | POA: Diagnosis present

## 2015-12-16 DIAGNOSIS — Z978 Presence of other specified devices: Secondary | ICD-10-CM | POA: Insufficient documentation

## 2015-12-16 NOTE — Progress Notes (Signed)
Primary Care Physician:  Vic Blackbird, MD  Primary Gastroenterologist:  Barney Drain, MD   Chief Complaint  Patient presents with  . Dysphagia    HPI:  Lindsay Cobb is a 75 y.o. female with h/o DM, decubitus ulcers of buttocks, depression who presents from the nursing home for further evaluation of weight loss, dysphagia and consideration of EGD +/- PEG. Patient reported cognitively intact last fall when initially admitted to nursing home for rehabilitation. She suffered left patella fracture surgically repaired but developed wound dehiscence and infection. Several trips to OR for debridement. 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 Also required surgical debridement of left gluteal decubitus ulcer. Since that time, patient has had notable decline in mental status and diminished oral intake. It was thought she may be suffering from major depression or delirium.   She weighed 260 in 07/2015. 250 pounds just prior to AKA. 234 lb on 10/06/15 post AKA. Down to 181 lb 12/5. Down to 165 lb most recent at nursing home.   According to records, speech therapy evaluated the patient. Patient does better with liquids than thick consistencies but for most part just spits out the food.   UGI series 11/2015: Examination limited by esophageal motility and patient's inability to stand/roll into required positions for imaging. Markedly impaired esophageal motility with prolonged retention of contrast within the thoracic esophagus. Very poor primary and secondary peristaltic waves. Intermittent retrograde peristalsis. Clearance of contrast from esophagus appears to be heavily dependent on gravity.   CT Abd without contrast 12/12/15:  IMPRESSION: 1. Abnormal T10-T11 disc with adjacent endplate destruction highly suspicious for acute discitis osteomyelitis. 2. No other acute findings identified about the abdomen. 3. Calcified aortic atherosclerosis.  Psych input ?major depression with psychotic  features +/- conversion disorder.  Dr. Dellia Nims did not feel patient is capable of making decision herself at this time. Family met with and agreed to PEG (sister and husband).   Current Outpatient Prescriptions  Medication Sig Dispense Refill  . amLODipine (NORVASC) 10 MG tablet TAKE 1 TABLET EVERY DAY 90 tablet 1  . Aripiprazole (ABILIFY) 1 MG/ML mL Take 10 mg by mouth daily.    Marland Kitchen aspirin EC 325 MG tablet Take 1 tablet (325 mg total) by mouth daily. 84 tablet 0  . atenolol (TENORMIN) 50 MG tablet Take 1 tablet (50 mg total) by mouth daily. 90 tablet 1  . ferrous sulfate 325 (65 FE) MG tablet Take 325 mg by mouth daily with breakfast.    . glucose blood (ACCU-CHEK AVIVA) test strip Use to monitor FSBS 1x daily. Dx: E11.9 50 each 6  . imipramine (TOFRANIL) 50 MG tablet Take 1 tablet (50 mg total) by mouth 3 (three) times daily. 270 tablet 1  . JANUVIA 25 MG tablet TAKE ONE TABLET BY MOUTH ONCE DAILY 30 tablet 3  . lovastatin (MEVACOR) 10 MG tablet Take 1 tablet (10 mg total) by mouth at bedtime. 90 tablet 2  . metFORMIN (GLUCOPHAGE) 1000 MG tablet TAKE 1 TABLET TWICE DAILY WITH A MEAL (Patient taking differently: Take 1,000 mg by mouth 2 (two) times daily with a meal. TAKE 1 TABLET TWICE DAILY WITH A MEAL) 180 tablet 1  . omeprazole (PRILOSEC) 20 MG capsule Take 20 mg by mouth daily.    Marland Kitchen oxyCODONE (OXY IR/ROXICODONE) 5 MG immediate release tablet Take one tablet by mouth every 4 hours as needed for pain 180 tablet 0   No current facility-administered medications for this visit.    Allergies as  of 12/16/2015  . (No Known Allergies)    Past Medical History  Diagnosis Date  . Diabetes mellitus     takes Januvia,Metformin,and Glipizide daily  . Hyperlipidemia     takes Lovastatin nightly  . Anemia     takes Ferrous Gluconate daily  . Peripheral edema     takes Furosemide daily as needed  . Depression     takes Tofranil daily  . Hypertension     takes Amlodipine,Atenolol,and  Benazepril daily  . Arthritis   . Joint pain   . Joint swelling   . Cataracts, bilateral     immature  . Infection of skin of knee     left  . Decubitus ulcer of buttock, unstageable ([place]) 10/2015    Past Surgical History  Procedure Laterality Date  . Breast biopsy Left   . Colonoscopy  2008    [place]: hemorrhoids  . Esophagogastroduodenoscopy    . Ercp    . Patellectomy Left 07/11/2015    Procedure: Left Partial PATELLECTOMY;  Surgeon: Leandrew Koyanagi, MD;  Location: [facility];  Service: Orthopedics;  Laterality: Left;  . Irrigation and debridement knee Left 08/15/2015    Procedure: IRRIGATION AND DEBRIDEMENT LEFT KNEE;  Surgeon: Leandrew Koyanagi, MD;  Location: [facility];  Service: Orthopedics;  Laterality: Left;  . Patellectomy Left 08/15/2015    Procedure: Revision PATELLECTOMY;  Surgeon: Leandrew Koyanagi, MD;  Location: [place];  Service: Orthopedics;  Laterality: Left;  . I&d extremity Left 08/18/2015    Procedure: IRRIGATION AND DEBRIDEMENT EXTREMITY;  Surgeon: Leandrew Koyanagi, MD;  Location: [facility];  Service: Orthopedics;  Laterality: Left;  Marland Kitchen Minor application of wound vac  08/18/2015    Procedure: MINOR APPLICATION OF WOUND VAC;  Surgeon: Leandrew Koyanagi, MD;  Location: [place];  Service: Orthopedics;;  . Irrigation and debridement knee Left 08/20/2015    Procedure: IRRIGATION AND DEBRIDEMENT KNEE WITH PLACEMENT OF INTEGRA;  Surgeon: Leandrew Koyanagi, MD;  Location: [place];  Service: Orthopedics;  Laterality: Left;  . I&d extremity Left 09/10/2015    Procedure: LEFT KNEE SPLIT THICKNESS SKIN GRAFT, IRRIGATION AND DEBRIDEMENT, WOUND VAC;  Surgeon: Leandrew Koyanagi, MD;  Location: [facility];  Service: Orthopedics;  Laterality: Left;  . Skin split graft Left 09/10/2015    Procedure: SKIN GRAFT SPLIT THICKNESS;  Surgeon: Leandrew Koyanagi, MD;  Location: [facility];  Service: Orthopedics;  Laterality: Left;  . Application of wound vac Left 09/10/2015    Procedure: APPLICATION OF WOUND VAC;  Surgeon: Leandrew Koyanagi, MD;  Location: [place];   Service: Orthopedics;  Laterality: Left;  . I&d extremity Left 09/12/2015    Procedure: IRRIGATION AND DEBRIDEMENT LEFT KNEE WITH PLACEMENT OF WOUND VAC;  Surgeon: Leandrew Koyanagi, MD;  Location: [place];  Service: Orthopedics;  Laterality: Left;  . Amputation Left 09/28/2015    Procedure: AMPUTATION ABOVE KNEE;  Surgeon: Newt Minion, MD;  Location: [place];  Service: Orthopedics;  Laterality: Left;  . Tee without cardioversion N/A 10/02/2015    Procedure: TRANSESOPHAGEAL ECHOCARDIOGRAM (TEE);  Surgeon: Skeet Latch, MD;  Location: [facility];  Service: Cardiovascular;  Laterality: N/A;    Family History  Problem Relation Age of Onset  . Hypertension Mother   . Diabetes Mother   . Hypertension Father     Social History   Social History  . Marital Status: Married    Spouse Name: N/A  . Number of Children: N/A  . Years of Education:  N/A   Occupational History  . Not on file.   Social History Main Topics  . Smoking status: Never Smoker   . Smokeless tobacco: Never Used  . Alcohol Use: No  . Drug Use: No  . Sexual Activity: Not on file   Other Topics Concern  . Not on file   Social History Narrative      ROS: ?reliability. She is oriented to person, place, not time.  General: Negative for fever, chills, fatigue, weakness. See hpi Eyes: Negative for vision changes.  ENT: Negative for hoarseness, difficulty swallowing , nasal congestion. CV: Negative for chest pain, angina, palpitations, dyspnea on exertion, peripheral edema.  Respiratory: Negative for dyspnea at rest, dyspnea on exertion, cough, sputum, wheezing.  GI: See history of present illness. GU:  Negative for dysuria, hematuria, urinary incontinence, urinary frequency, nocturnal urination.  MS: Negative for joint pain, low back pain.  Derm: Negative for rash or itching.  Neuro: Negative for weakness, abnormal sensation, seizure, frequent headaches, memory loss, confusion.  Psych: Negative for anxiety,  depression, suicidal ideation, hallucinations.  Endo: see hpi Heme: Negative for bruising or bleeding. Allergy: Negative for rash or hives.    Physical Examination:  BP 103/65 mmHg  Pulse 90  Temp(Src) 97.8 F (36.6 C) (Oral)   General: Well-nourished, well-developed in no acute distress. In wheelchair. Accompanied by aide from nursing home. Head: Normocephalic, atraumatic.   Eyes: Conjunctiva pale, no icterus. Mouth: Oropharyngeal mucosa moist and pink , no lesions erythema or exudate. Neck: Supple without thyromegaly, masses, or lymphadenopathy.  Lungs: Clear to auscultation bilaterally.  Heart: Regular rate and rhythm, no murmurs rubs or gallops.  Abdomen: Bowel sounds are normal, nontender, nondistended, no hepatosplenomegaly or masses, no abdominal bruits or    hernia , no rebound or guarding.  Limited exam, performed in wheelchair Rectal: not performed Extremities: left AKA. Right lower extremity without edema.  Neuro: Alert and oriented x 2 , grossly normal neurologically.  Skin: Warm and dry, no rash or jaundice.   Psych: Alert and cooperative, normal mood and affect.  Labs: Lab Results  Component Value Date   TSH 1.279 12/05/2015   Lab Results  Component Value Date   CREATININE 0.79 12/06/2015   BUN 16 12/06/2015   NA 131* 12/06/2015   K 4.9 12/06/2015   CL 101 12/06/2015   CO2 21* 12/06/2015   Lab Results  Component Value Date   ALT 8* 11/17/2015   AST 14* 11/17/2015   ALKPHOS 74 11/17/2015   BILITOT 0.5 11/17/2015   Lab Results  Component Value Date   WBC 8.8 11/17/2015   HGB 9.7* 11/17/2015   HCT 30.3* 11/17/2015   MCV 79.1 11/17/2015   PLT 461* 11/17/2015   Lab Results  Component Value Date   IRON 17* 09/23/2015   TIBC 160* 09/23/2015   FERRITIN 158 09/23/2015     Imaging Studies: Ct Abdomen Wo Contrast  2016-01-11  ADDENDUM REPORT: 01/11/16 13:44 ADDENDUM: Study discussed by telephone with Dr. Tommie Raymond (covering for Dr. Linton Ham )  on 2016/01/11 At 1334 hours. Electronically Signed   By: Genevie Ann M.D.   On: 2016-01-11 13:44  2016-01-11  CLINICAL DATA:  75 year old female with weight loss and decreased p.o. intake since mid December. Dementia. Initial encounter. EXAM: CT ABDOMEN WITHOUT CONTRAST TECHNIQUE: Multidetector CT imaging of the abdomen was performed following the standard protocol without IV contrast. COMPARISON:  Upper GI series 11/26/15.  Pelvis CT 10/30/2015. FINDINGS: Mild right lower lobe atelectasis. Minimal left  lung base atelectasis. No pericardial or pleural effusion. Calcified coronary artery atherosclerosis. Destruction of the endplates of W10 and U72 with abnormal intermediate density throughout the disc space, and mild ventral and lateral paraspinal stranding (series 2, image 15). Chronic posterior right seventh rib fracture. Elsewhere bone mineralization is within normal limits. No similar vertebral endplate changes elsewhere. *INSERT* calcified no free fluid or free air in the abdomen. Oral contrast in the colon from the ascending to the splenic flexure segments. Gas-filled nondilated redundant sigmoid colon partially visible. No dilated small bowel. The stomach is mildly distended with air and fluid. Negative noncontrast liver, spleen, pancreas, adrenal glands, and kidneys. No lymphadenopathy in the abdomen. IMPRESSION: 1. Abnormal T10-T11 disc with adjacent endplate destruction highly suspicious for acute discitis osteomyelitis. 2. No other acute findings identified about the abdomen. 3. Calcified aortic atherosclerosis. Electronically Signed: By: Genevie Ann M.D. On: 12/12/2015 13:21   Dg Duanne Limerick  W/kub  11/26/2015  CLINICAL DATA:  Dysphagia, diabetes mellitus, hypertension, hyperlipidemia, post above-the-knee amputation EXAM: UPPER GI SERIES WITHOUT KUB TECHNIQUE: Routine upper GI series was performed with thin barium. Examination limited by esophageal motility and patient's inability to stand/roll into required positions  for imaging. FLUOROSCOPY TIME:  Radiation Exposure Index (as provided by the fluoroscopic device): Not provided If the device does not provide the exposure index: Fluoroscopy Time (in minutes and seconds):  2 minutes 54 seconds Number of Acquired Images:  2 COMPARISON:  Abdominal radiograph 11/17/2015 FINDINGS: Gaseous distention of the proximal colon with gas and stool throughout remainder of colon. Overall bowel gas pattern is nonobstructive. No bowel wall thickening. Grossly normal esophageal distention. No definite mass or stricture identified. No persistent intraluminal filling defects. Esophageal walls appear smooth. Markedly impaired esophageal motility with prolonged retention of contrast within the thoracic esophagus. Very poor primary peristaltic and secondary peristaltic waves are identified. Intermittent retrograde peristalsis noted. Clearance is facilitated by elevating the head of the bed and the utilization of gravity, though this was limited by patient's amputation. Clearance of contrast from the esophagus appears to be heavily dependent on gravity. Only minimal distention of the stomach is identified due to inability to get significant contrast into the stomach. No definite gastric outlet obstruction, gross gastric mass or ulceration identified. Pill esophagram was not performed due to patient condition. Unable to adequately position for targeted rapid sequence imaging of the cervical esophagus and hypopharynx. IMPRESSION: Significantly limited exam due to a combination of patient condition, with limited positioning, and severe diffuse impairment of esophageal motility as above. No gross evidence of mass or obstruction on limited exam. If better visualization of the upper GI tract is required, recommend endoscopy. Electronically Signed   By: Lavonia Dana M.D.   On: 11/26/2015 16:18

## 2015-12-16 NOTE — Patient Instructions (Signed)
1. We will discuss your case with Dr. Darrick PennaFields and your family regarding feeding tube option. At a minimal we will perform upper endoscopy in the near future to evaluate weight loss and esophageal dysmotility but if all are in agreement we will also put in feeding tube at the same time if appropriate. Further recommendations to follow.

## 2015-12-17 ENCOUNTER — Telehealth: Payer: Self-pay | Admitting: *Deleted

## 2015-12-17 ENCOUNTER — Non-Acute Institutional Stay (SKILLED_NURSING_FACILITY): Payer: Commercial Managed Care - HMO | Admitting: Internal Medicine

## 2015-12-17 DIAGNOSIS — L89324 Pressure ulcer of left buttock, stage 4: Secondary | ICD-10-CM | POA: Diagnosis not present

## 2015-12-17 DIAGNOSIS — E43 Unspecified severe protein-calorie malnutrition: Secondary | ICD-10-CM | POA: Diagnosis not present

## 2015-12-17 NOTE — Telephone Encounter (Signed)
Received fax from [facility] stating this pt has been discharged from Hospital  Date of admit:10/24/15  Date of discharge:12/13/15  Level of Care: skilled nursing facility  Discharging facility: [facility]  Attending physician: Prescott GumSmith Rondell,MD  PCP: Kingsley SpittleKawanta [place],MD  Discharge Dispostion: Discharged to a facility that provides custodial or supportive care  DX: L 89.90- pressure ulcer of unspecified site, unspecified stage

## 2015-12-18 ENCOUNTER — Encounter: Payer: Self-pay | Admitting: Gastroenterology

## 2015-12-18 ENCOUNTER — Encounter (HOSPITAL_COMMUNITY)
Admission: AD | Admit: 2015-12-18 | Discharge: 2015-12-18 | Disposition: A | Discharge status: Home / Self Care | Payer: Commercial Managed Care - HMO | Source: Skilled Nursing Facility | Attending: Internal Medicine | Admitting: Internal Medicine

## 2015-12-18 ENCOUNTER — Telehealth: Payer: Self-pay | Admitting: *Deleted

## 2015-12-18 DIAGNOSIS — I10 Essential (primary) hypertension: Secondary | ICD-10-CM | POA: Diagnosis not present

## 2015-12-18 DIAGNOSIS — B9562 Methicillin resistant Staphylococcus aureus infection as the cause of diseases classified elsewhere: Secondary | ICD-10-CM | POA: Diagnosis present

## 2015-12-18 LAB — LIPID PANEL
CHOLESTEROL: 86 mg/dL (ref 0–200)
HDL: 36 mg/dL — ABNORMAL LOW (ref >40)
LDL CALC: 36 mg/dL (ref 0–99)
Total CHOL/HDL Ratio: 2.4 RATIO
Triglycerides: 71 mg/dL (ref <150)
VLDL: 14 mg/dL (ref 0–40)

## 2015-12-18 NOTE — Telephone Encounter (Signed)
Submitted [facility] referral thru [facility] connect for authorization to Dr. 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

## 2015-12-18 NOTE — Assessment & Plan Note (Addendum)
75 y/o unfortunate female who suffered left patellar fracture in 07/2015 requiring surgery. 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 She also has unstageable left gluteal ulcer requiring surgical debridement and some gluteal ulcer on the right to lesser of degree. She has suffered cognitive decline over the course of the last 4-5 months of unclear etiology, ?depression, ?delirium ?conversion disorder per notes. She has been noted to have profound weight loss. She weighed 260 in 07/2015 before her illness began. She had dropped to 250 pounds just prior to the AKA. Postoperatively she weighed 232 lb. Since that time she has continued to lose weight and most recently down to 165 lb per nursing home records. She has been noted to be consuming minimal food. According to staff, she does best with thin liquids and does drink water. They are starting to have increasing difficulty to get her to take her medicines. She has been noted to spit out food without attempts to swallow. UGI series very limited but demonstrated markedly impaired esophageal motility.   According to Dr. Jannetta Quintobson's note, family (sister and husband) desire PEG placement. Dr. Leanord Hawkingobson is requesting EGD which is very reasonable to evaluate her symptoms. Dr. Leanord Hawkingobson reports patient not able to make decision herself about PEG due to cognitive issues. Today she tells me she does not want the PEG unless she absolutely has to have it. We discussed the need to improve her nutritional status for purposes of healing of wounds and overall health. We discussed that this would not eliminate her risk of aspiration. I will talk with Dr. Darrick PennaFields, further recommendations to follow.

## 2015-12-20 LAB — WOUND CULTURE

## 2015-12-20 LAB — CULTURE, BLOOD (ROUTINE X 2)
CULTURE: NO GROWTH
Culture: NO GROWTH

## 2015-12-21 NOTE — Progress Notes (Addendum)
Patient ID: Lindsay Cobb, female   DOB: 1941-01-13, 75 y.o.   MRN: [id number]                PROGRESS NOTE  DATE:  12/17/2015            FACILITY: [facility]                  LEVEL OF CARE:   SNF   Acute Visit                 CHIEF COMPLAINT:  Review of multiple medical issues.      HISTORY OF PRESENT ILLNESS:  Please reference my note of 12/01/2015 in terms of history.     She continues to do poorly in terms of intake, although she has not lost a considerable amount of weight, most recently at 164.8 pounds.   She was 179.7 pounds on 11/16/2015.  She is eating much less than 25% of all meals.  Today, her sister tried to feed her.  I do not think she has taken anything.  The cause of this has not really been clear.  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  I have referred her to GI for consideration of a diagnostic endoscopy with PEG tube placement.  I ordered a CT scan of her abdomen, which was done on 12/12/2015, in order to see if there was anything else intra-abdominally, malignancy, etc. that could be contributing.  Nothing was seen.  However, it was noted that she had destruction of the end plates of S01 and U93 with intermediate density throughout the disk space, suggestive of diskitis/osteomyelitis.    Her left buttock wound also has caused some concern with odor and pain.  The wound VAC has been removed, which seems reasonable.    REVIEW OF SYSTEMS:  Other than the weight loss, review of systems is just not meaningful.     PHYSICAL EXAMINATION:   GENERAL APPEARANCE:  The patient continues to visibly lose weight.   CHEST/RESPIRATORY:  Clear air entry bilaterally.    CARDIOVASCULAR:   CARDIAC:  Heart sounds are normal.  She does not appear to be dehydrated.   GASTROINTESTINAL:   ABDOMEN:  No masses are noted.    LIVER/SPLEEN/KIDNEYS:  No liver, no spleen.   GENITOURINARY:   BLADDER:  Foley catheter in place.    SKIN:   INSPECTION:  Her left gluteal wound  is contracted, although there is some depth.  There is also an odor.  No obvious soft tissue infection.   The area on the right is more superficial, but currently unstageable.  We have been using Santyl.   PSYCHIATRIC:   MENTAL STATUS:  A combination of depression and delirium.  Her cognition has gone down quite a bit from when I first met this lady in the middle of last summer.    ASSESSMENT/PLAN:           Severe weight loss.  I have asked for a diagnostic endoscopy and PEG tube placement.  She saw GI yesterday.    Decubitus ulcers.  We are waiting for culture results here.    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  Right now, I do not think this patient can undergo any particular investigations.  I would like to get the PEG tube placed before considering her for a CT scan-guided aspiration and biopsy of this area.  Right now, I simply do not think she can go through this.  Also notable for  the fact that she is completely asymptomatic in this area.  ?infection vs maliganacy.   ?Infection of her left buttock wound.  Culture as of today is still pending.  Otherwise this does not look ominous at the present time  Blood cultures.  Again, still pending.  I am looking at a source of sepsis.    The next step here is stabilizing this woman's nutritional status.  The cause of her declining cognition is not clear to me.  Once we stabilize her, I will consider trying to get investigations into the T9-T10 end plate destructions which could either be infectious or malignant.

## 2015-12-22 ENCOUNTER — Telehealth: Payer: Self-pay | Admitting: Gastroenterology

## 2015-12-22 NOTE — Progress Notes (Signed)
I discussed case with Dr. Darrick PennaFields. She agrees to EGD with PEG placement. Family needs to understand that if patient pulls tube out (as can be seen in situations where patient is more alert and physically capable to pull out and may not desire PEG) that she may require surgery to repair any potential damage done.   Also the family needs to understand that this PEG tube is merely a source of nutrition & may not change the course of her disease.  It does not decrease the risk of aspiration.  We have discussed risks & benefits which include, but are not limited to, tube becoming dislodged or obstructed, bleeding, infection, perforation & drug reaction.    Husband or healthcare power of attorney will need to be present to sign consent.   I have tried to call husband twice today but call will not go through.    PLEASE SCHEDULE EGD/PEG PLACEMENT WITH [facility].  NEEDS ANCEF 2 GRAMS PRE-PROCEDURE. PLEASE TRY TO GET PATIENT'S SISTERS PHONE NUMBER SO I CAN CALL HER AND A GOOD NUMBER FOR THE HUSBAND.

## 2015-12-22 NOTE — Telephone Encounter (Signed)
Tried to call Larita FifeLynn back, left msg regarding the following that was documented in OV note today. I need better contact number for husband and for sister too. I have not been able to discuss with family.   I discussed case with Dr. Darrick PennaFields. She agrees to EGD with PEG placement. Family needs to understand that if patient pulls tube out (as can be seen in situations where patient is more alert and physically capable to pull out and may not desire PEG) that she may require surgery to repair any potential damage done.   Also the family needs to understand that this PEG tube is merely a source of nutrition & may not change the course of her disease.  It does not decrease the risk of aspiration.  We have discussed risks & benefits which include, but are not limited to, tube becoming dislodged or obstructed, bleeding, infection, perforation & drug reaction.    Husband or healthcare power of attorney will need to be present to sign consent.   I have tried to call husband twice today but call will not go through.    PLEASE SCHEDULE EGD/PEG PLACEMENT WITH [facility].  NEEDS ANCEF 2 GRAMS PRE-PROCEDURE. PLEASE TRY TO GET PATIENT'S SISTERS PHONE NUMBER SO I CAN CALL HER AND A GOOD NUMBER FOR THE HUSBAND.

## 2015-12-22 NOTE — Telephone Encounter (Signed)
Talked with patients husband and is said that 01/05/16 @ 2:30 would ne fine. He is aware that he need to be at the hospital at 1:30. If we need to talk with him we will have to call Britta MccreedyBarbara Wincher-[phone](sister) and she will get in touch with him because his phone is not working.

## 2015-12-22 NOTE — Telephone Encounter (Signed)
Larita FifeLynn (dietician) called to follow up on patient's plan of care. She said that the patient isn't getting any better and has severe weight loss. She is wanting to know if a decision has been made if the patient is getting a PEG tube. She is asking to speak with [facility]. Please call [phone]

## 2015-12-23 ENCOUNTER — Other Ambulatory Visit: Payer: Self-pay

## 2015-12-23 ENCOUNTER — Other Ambulatory Visit: Payer: Self-pay | Admitting: Licensed Clinical Social Worker

## 2015-12-23 DIAGNOSIS — R1310 Dysphagia, unspecified: Secondary | ICD-10-CM

## 2015-12-23 NOTE — Progress Notes (Signed)
cc'ed to pcp °

## 2015-12-23 NOTE — Patient Outreach (Signed)
Assessment:  CSW called contact phone number of client on 12/23/15.   CSW spoke via phone with Lindsay Cobb, social worker at [facility] on 12/23/15. CSW verified identity of Lindsay Cobb. Tami reported that client is receiving nursing care at facility. Tami reported that client is scheduled for PEG tube placement on 01/05/16 at [facility]. Dr. Jonette Eva will complete PEG tube placement for client on 01/05/16 at [facility]..  Client is communicative per Lindsay Cobb. . Client has lost significant weight in recent months  (40 pound weight loss since August of 2016) She is on a mechanical soft diet. Client holds food in mouth and often does not swallow, although she is able physically to swallow.  Client is taking medications as prescribed.  Client has support form her spouse, Lindsay Post.  Client has support from her sister, Lindsay Cobb..  Client has nieces who are supportive of client.  Client has multiple medical needs.  Client admitted to [facility] on 09/16/15.  She went to the hospital on 09/21/15.  She readmitted from the hospital to [facility] on 10/08/15.  She went to hospital on 10/14/15.  She readmitted to [facility] on 10/24/15.  She has been at [facility] since 10/24/15.  Client weighs 160. 3 pounds at present, per Lindsay Cobb, Child psychotherapist at [employer].  Tami reported that client has skin integrity issues. Client has two areas on her sacrum where she receives scheduled skin care per [facility]. Client had been receiving physical therapy sessions at facility;  however, client has not been able to participate in physical therapy sessions recently due to her multiple medical needs.  CSW thanked Lindsay Cobb for phone conversation with CSW on 12/23/15.  Plan: Client to cooperate with care providers at [facility] providing nursing and supportive care for client. CSW to call client/family representative in three weeks to assess client needs.  Lindsay Cobb.Lindsay Cobb MSW,  LCSW Licensed Clinical Social Worker [facility] Care Management [phone]

## 2015-12-24 ENCOUNTER — Non-Acute Institutional Stay (SKILLED_NURSING_FACILITY): Payer: Commercial Managed Care - HMO | Admitting: Internal Medicine

## 2015-12-24 ENCOUNTER — Encounter (HOSPITAL_COMMUNITY)
Admission: AD | Admit: 2015-12-24 | Discharge: 2015-12-24 | Disposition: A | Discharge status: Home / Self Care | Payer: Commercial Managed Care - HMO | Source: Skilled Nursing Facility | Attending: Internal Medicine | Admitting: Internal Medicine

## 2015-12-24 ENCOUNTER — Telehealth: Payer: Self-pay

## 2015-12-24 DIAGNOSIS — I10 Essential (primary) hypertension: Secondary | ICD-10-CM | POA: Diagnosis not present

## 2015-12-24 DIAGNOSIS — E43 Unspecified severe protein-calorie malnutrition: Secondary | ICD-10-CM

## 2015-12-24 DIAGNOSIS — R634 Abnormal weight loss: Secondary | ICD-10-CM | POA: Diagnosis not present

## 2015-12-24 DIAGNOSIS — F32A Depression, unspecified: Secondary | ICD-10-CM

## 2015-12-24 DIAGNOSIS — F329 Major depressive disorder, single episode, unspecified: Secondary | ICD-10-CM | POA: Diagnosis not present

## 2015-12-24 LAB — COMPREHENSIVE METABOLIC PANEL
ALBUMIN: 2.1 g/dL — AB (ref 3.5–5.0)
ALT: 8 U/L — ABNORMAL LOW (ref 14–54)
ANION GAP: 8 (ref 5–15)
AST: 15 U/L (ref 15–41)
Alkaline Phosphatase: 101 U/L (ref 38–126)
BUN: 24 mg/dL — AB (ref 6–20)
CHLORIDE: 101 mmol/L (ref 101–111)
CO2: 23 mmol/L (ref 22–32)
Calcium: 9.4 mg/dL (ref 8.9–10.3)
Creatinine, Ser: 0.77 mg/dL (ref 0.44–1.00)
GFR calc Af Amer: >60 mL/min (ref >60)
GFR calc non Af Amer: >60 mL/min (ref >60)
GLUCOSE: 116 mg/dL — AB (ref 65–99)
POTASSIUM: 5.1 mmol/L (ref 3.5–5.1)
SODIUM: 132 mmol/L — AB (ref 135–145)
TOTAL PROTEIN: 6.8 g/dL (ref 6.5–8.1)
Total Bilirubin: 0.4 mg/dL (ref 0.3–1.2)

## 2015-12-24 LAB — CBC WITH DIFFERENTIAL/PLATELET
BASOS ABS: 0 10*3/uL (ref 0.0–0.1)
Basophils Relative: 1 %
EOS PCT: 4 %
Eosinophils Absolute: 0.2 10*3/uL (ref 0.0–0.7)
HEMATOCRIT: 32.7 % — AB (ref 36.0–46.0)
Hemoglobin: 10.5 g/dL — ABNORMAL LOW (ref 12.0–15.0)
LYMPHS ABS: 1.3 10*3/uL (ref 0.7–4.0)
LYMPHS PCT: 24 %
MCH: 25.7 pg — AB (ref 26.0–34.0)
MCHC: 32.1 g/dL (ref 30.0–36.0)
MCV: 80 fL (ref 78.0–100.0)
MONO ABS: 0.7 10*3/uL (ref 0.1–1.0)
Monocytes Relative: 13 %
NEUTROS ABS: 3.1 10*3/uL (ref 1.7–7.7)
Neutrophils Relative %: 58 %
PLATELETS: 293 10*3/uL (ref 150–400)
RBC: 4.09 MIL/uL (ref 3.87–5.11)
RDW: 17.3 % — AB (ref 11.5–15.5)
WBC: 5.3 10*3/uL (ref 4.0–10.5)

## 2015-12-24 LAB — SEDIMENTATION RATE: SED RATE: 45 mm/h — AB (ref 0–22)

## 2015-12-24 LAB — C-REACTIVE PROTEIN: CRP: 8.4 mg/dL — AB (ref <1.0)

## 2015-12-24 NOTE — Telephone Encounter (Signed)
Dr.Robson called from the [facility]. He is wanting to know if her EGD/PEG placement can be moved up sooner because she said that she as wasting away. Please advise

## 2015-12-24 NOTE — Telephone Encounter (Signed)
Tried to call patient's husband but phone out of order. He gave [place] permission to speak to patient's sister previously on 12/22/15.Marland Kitchen Spoke with patient's sister, Buck Mam. She has been visiting patient during feeding time. Taking very little by mouth. Will sometimes take pudding or ice cream. Remote "nervous breakdown" in the 1970s, stayed in hospital. Had been doing well since then. Prior to patient injury her patella, patient was living at home, capable of all ADLs, eating well. Decline has occurred over the past several months. She reports that patient's husband and herself want the PEG. Both plan to be at hospital for procedure. The patient/family understand that this PEG tube is merely a source of nutrition & may not change the course of her disease.  It does not decrease the risk of aspiration.  We have discussed risks & benefits which include, but are not limited to, tube becoming dislodged or obstructed, bleeding, infection, perforation & drug reaction.  The  family agree(s) with this plan & written consent will be obtained.        I spoke with Dr. Jena Gauss, we will try to get patient scheduled for EGD/PEG within the next one week.   Previous work up for weight loss CT abd without contrast: 12/12/15  IMPRESSION: 1. Abnormal T10-T11 disc with adjacent endplate destruction highly suspicious for acute discitis osteomyelitis. 2. No other acute findings identified about the abdomen. 3. Calcified aortic atherosclerosis.  UGI series limited 11/26/15 IMPRESSION: Significantly limited exam due to a combination of patient condition, with limited positioning, and severe diffuse impairment of esophageal motility as above.  No gross evidence of mass or obstruction on limited exam.  If better visualization of the upper GI tract is required, recommend Endoscopy.  CT pelvis without contrast 10/30/15 IMPRESSION: Deep skin and subcutaneous ulceration over the left sacrum. No focal abscess.  No bone involvement.  CXR 10/30/15 IMPRESSION: Mild atelectasis right lower lobe region. No edema or consolidation  Patient is Full Code per [facility] staff.

## 2015-12-24 NOTE — Telephone Encounter (Signed)
I agree, we need to try to do within next 5 days or so if possible. Patient is unable to eat. Otherwise she may end up in hospital in interim.

## 2015-12-25 ENCOUNTER — Other Ambulatory Visit: Payer: Self-pay

## 2015-12-25 NOTE — Telephone Encounter (Signed)
Patient is scheduled for the 24th at 1:30 with [facility]. I [facility] and she is aware that the dae and time has changed. I tried to call the patients sister Britta Mccreedy but she was not a t home so I left a message for her to call us.

## 2015-12-25 NOTE — Telephone Encounter (Signed)
Called the [facility] and [facility]) and they are aware that if she can not eat or drink then she should go to the ER.

## 2015-12-25 NOTE — Telephone Encounter (Signed)
PLEASE CALL DR. Leanord Hawking. PT IS SCHEDULED FOR TUES JAN 24. DR. Shamarr Faucett IN OUT ON VACATION UNTIL JAN 24. IF PT CANNOT EAT OR DRINK HE SHOULD SEND HER TO THE ED.

## 2015-12-25 NOTE — Telephone Encounter (Signed)
Thanks

## 2015-12-26 ENCOUNTER — Emergency Department (HOSPITAL_COMMUNITY)
Admission: EM | Admit: 2015-12-26 | Discharge: 2015-12-26 | Disposition: A | Discharge status: Home / Self Care | Payer: Commercial Managed Care - HMO | Attending: Emergency Medicine | Admitting: Emergency Medicine

## 2015-12-26 ENCOUNTER — Encounter (HOSPITAL_COMMUNITY): Payer: Self-pay

## 2015-12-26 ENCOUNTER — Emergency Department (HOSPITAL_COMMUNITY): Payer: Commercial Managed Care - HMO

## 2015-12-26 ENCOUNTER — Non-Acute Institutional Stay (SKILLED_NURSING_FACILITY): Payer: Commercial Managed Care - HMO | Admitting: Internal Medicine

## 2015-12-26 DIAGNOSIS — E785 Hyperlipidemia, unspecified: Secondary | ICD-10-CM | POA: Diagnosis not present

## 2015-12-26 DIAGNOSIS — Z8619 Personal history of other infectious and parasitic diseases: Secondary | ICD-10-CM | POA: Diagnosis not present

## 2015-12-26 DIAGNOSIS — Z79899 Other long term (current) drug therapy: Secondary | ICD-10-CM | POA: Diagnosis not present

## 2015-12-26 DIAGNOSIS — F329 Major depressive disorder, single episode, unspecified: Secondary | ICD-10-CM | POA: Insufficient documentation

## 2015-12-26 DIAGNOSIS — R627 Adult failure to thrive: Secondary | ICD-10-CM | POA: Diagnosis not present

## 2015-12-26 DIAGNOSIS — M1990 Unspecified osteoarthritis, unspecified site: Secondary | ICD-10-CM | POA: Insufficient documentation

## 2015-12-26 DIAGNOSIS — N390 Urinary tract infection, site not specified: Secondary | ICD-10-CM | POA: Insufficient documentation

## 2015-12-26 DIAGNOSIS — D649 Anemia, unspecified: Secondary | ICD-10-CM | POA: Insufficient documentation

## 2015-12-26 DIAGNOSIS — I10 Essential (primary) hypertension: Secondary | ICD-10-CM | POA: Diagnosis not present

## 2015-12-26 DIAGNOSIS — Z7982 Long term (current) use of aspirin: Secondary | ICD-10-CM | POA: Diagnosis not present

## 2015-12-26 DIAGNOSIS — R634 Abnormal weight loss: Secondary | ICD-10-CM | POA: Diagnosis not present

## 2015-12-26 DIAGNOSIS — E119 Type 2 diabetes mellitus without complications: Secondary | ICD-10-CM | POA: Diagnosis not present

## 2015-12-26 DIAGNOSIS — Z8669 Personal history of other diseases of the nervous system and sense organs: Secondary | ICD-10-CM | POA: Diagnosis not present

## 2015-12-26 DIAGNOSIS — Z872 Personal history of diseases of the skin and subcutaneous tissue: Secondary | ICD-10-CM | POA: Insufficient documentation

## 2015-12-26 LAB — URINALYSIS, ROUTINE W REFLEX MICROSCOPIC
Glucose, UA: NEGATIVE mg/dL
Ketones, ur: 15 mg/dL — AB
NITRITE: POSITIVE — AB
PH: 5.5 (ref 5.0–8.0)
Protein, ur: 100 mg/dL — AB

## 2015-12-26 LAB — URINE MICROSCOPIC-ADD ON

## 2015-12-26 LAB — CBC WITH DIFFERENTIAL/PLATELET
Basophils Absolute: 0 10*3/uL (ref 0.0–0.1)
Basophils Relative: 0 %
EOS ABS: 0.2 10*3/uL (ref 0.0–0.7)
EOS PCT: 3 %
HEMATOCRIT: 31.6 % — AB (ref 36.0–46.0)
HEMOGLOBIN: 10.1 g/dL — AB (ref 12.0–15.0)
LYMPHS ABS: 1.5 10*3/uL (ref 0.7–4.0)
LYMPHS PCT: 22 %
MCH: 25.4 pg — ABNORMAL LOW (ref 26.0–34.0)
MCHC: 32 g/dL (ref 30.0–36.0)
MCV: 79.6 fL (ref 78.0–100.0)
MONO ABS: 0.6 10*3/uL (ref 0.1–1.0)
Monocytes Relative: 9 %
Neutro Abs: 4.6 10*3/uL (ref 1.7–7.7)
Neutrophils Relative %: 66 %
Platelets: 322 10*3/uL (ref 150–400)
RBC: 3.97 MIL/uL (ref 3.87–5.11)
RDW: 17.2 % — ABNORMAL HIGH (ref 11.5–15.5)
WBC: 6.9 10*3/uL (ref 4.0–10.5)

## 2015-12-26 LAB — PROTIME-INR
INR: 1.44 (ref 0.00–1.49)
Prothrombin Time: 17.6 seconds — ABNORMAL HIGH (ref 11.6–15.2)

## 2015-12-26 LAB — COMPREHENSIVE METABOLIC PANEL
ALBUMIN: 2.5 g/dL — AB (ref 3.5–5.0)
ALK PHOS: 105 U/L (ref 38–126)
ALT: 8 U/L — ABNORMAL LOW (ref 14–54)
ANION GAP: 10 (ref 5–15)
AST: 16 U/L (ref 15–41)
BILIRUBIN TOTAL: 0.6 mg/dL (ref 0.3–1.2)
BUN: 18 mg/dL (ref 6–20)
CALCIUM: 9.5 mg/dL (ref 8.9–10.3)
CO2: 22 mmol/L (ref 22–32)
Chloride: 101 mmol/L (ref 101–111)
Creatinine, Ser: 0.77 mg/dL (ref 0.44–1.00)
GFR calc Af Amer: >60 mL/min (ref >60)
GLUCOSE: 135 mg/dL — AB (ref 65–99)
POTASSIUM: 4.7 mmol/L (ref 3.5–5.1)
Sodium: 133 mmol/L — ABNORMAL LOW (ref 135–145)
TOTAL PROTEIN: 8 g/dL (ref 6.5–8.1)

## 2015-12-26 LAB — LIPASE, BLOOD: LIPASE: 18 U/L (ref 11–51)

## 2015-12-26 MED ORDER — CIPROFLOXACIN HCL 500 MG PO TABS: 500.0000 mg | ORAL_TABLET | Freq: Two times a day (BID) | ORAL | Status: AC

## 2015-12-26 MED ORDER — SODIUM CHLORIDE 0.9 % IV SOLN: INTRAVENOUS | Status: DC

## 2015-12-26 MED ORDER — CEFTRIAXONE SODIUM 1 G IJ SOLR
1.0000 g | Freq: Once | INTRAMUSCULAR | Status: AC
  Administered 2015-12-26: 1 g via INTRAVENOUS
  Filled 2015-12-26: qty 10

## 2015-12-26 MED ORDER — SODIUM CHLORIDE 0.9 % IV BOLUS (SEPSIS)
250.0000 mL | Freq: Once | INTRAVENOUS | Status: AC
  Administered 2015-12-26: 250 mL via INTRAVENOUS

## 2015-12-26 NOTE — ED Notes (Signed)
Pt here from [facility] for failure to thrive.

## 2015-12-26 NOTE — ED Notes (Signed)
EDP in to evaluate 

## 2015-12-26 NOTE — ED Notes (Signed)
Pt given a meal tray 

## 2015-12-26 NOTE — ED Notes (Signed)
Pt has foley in place from [facility]

## 2015-12-26 NOTE — ED Provider Notes (Addendum)
CSN: [id number]     Arrival date & time 12/26/15  1320 History   First MD Initiated Contact with Patient 12/26/15 1342     Chief Complaint  Patient presents with  . Failure To Thrive     (Consider location/radiation/quality/duration/timing/severity/associated sxs/prior Treatment) The history is provided by the patient.   75 year old patient from [facility]. Patient states she's had decreased appetite for the past several days. [facility] referred her in for failure to thrive.. The patient is currently on Keflex. Patient denies any specific symptoms or pain. 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  Past Medical History  Diagnosis Date  . Diabetes mellitus     takes Januvia,Metformin,and Glipizide daily  . Hyperlipidemia     takes Lovastatin nightly  . Anemia     takes Ferrous Gluconate daily  . Peripheral edema     takes Furosemide daily as needed  . Depression     takes Tofranil daily  . Hypertension     takes Amlodipine,Atenolol,and Benazepril daily  . Arthritis   . Joint pain   . Joint swelling   . Cataracts, bilateral     immature  . Infection of skin of knee     left  . 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 Breast biopsy Left   . Colonoscopy  2008    [place]: hemorrhoids  . Esophagogastroduodenoscopy    . Ercp    . Patellectomy Left 07/11/2015    Procedure: Left Partial PATELLECTOMY;  Surgeon: Tarry Kos, MD;  Location: [facility] OR;  Service: Orthopedics;  Laterality: Left;  . Irrigation and debridement knee Left 08/15/2015    Procedure: IRRIGATION AND DEBRIDEMENT LEFT KNEE;  Surgeon: Tarry Kos, MD;  Location: [facility] OR;  Service: Orthopedics;  Laterality: Left;  . Patellectomy Left 08/15/2015    Procedure: Revision PATELLECTOMY;  Surgeon: Tarry Kos, MD;  Location: [facility] OR;  Service: Orthopedics;  Laterality: Left;  . I&d extremity Left 08/18/2015    Procedure: IRRIGATION AND  DEBRIDEMENT EXTREMITY;  Surgeon: Tarry Kos, MD;  Location: [facility] OR;  Service: Orthopedics;  Laterality: Left;  Marland Kitchen Minor application of wound vac  08/18/2015    Procedure: MINOR APPLICATION OF WOUND VAC;  Surgeon: Tarry Kos, MD;  Location: [facility] OR;  Service: Orthopedics;;  . Irrigation and debridement knee Left 08/20/2015    Procedure: IRRIGATION AND DEBRIDEMENT KNEE WITH PLACEMENT OF INTEGRA;  Surgeon: Tarry Kos, MD;  Location: [facility] OR;  Service: Orthopedics;  Laterality: Left;  . I&d extremity Left 09/10/2015    Procedure: LEFT KNEE SPLIT THICKNESS SKIN GRAFT, IRRIGATION AND DEBRIDEMENT, WOUND VAC;  Surgeon: Tarry Kos, MD;  Location: [facility] OR;  Service: Orthopedics;  Laterality: Left;  . Skin split graft Left 09/10/2015    Procedure: SKIN GRAFT SPLIT THICKNESS;  Surgeon: Tarry Kos, MD;  Location: [facility] OR;  Service: Orthopedics;  Laterality: Left;  . Application of wound vac Left 09/10/2015    Procedure: APPLICATION OF WOUND VAC;  Surgeon: Tarry Kos, MD;  Location: [facility] OR;  Service: Orthopedics;  Laterality: Left;  . I&d extremity Left 09/12/2015    Procedure: IRRIGATION AND DEBRIDEMENT LEFT KNEE WITH PLACEMENT OF WOUND VAC;  Surgeon: Tarry Kos, MD;  Location: [facility] OR;  Service: Orthopedics;  Laterality: Left;  . Amputation Left 09/28/2015    Procedure: AMPUTATION ABOVE KNEE;  Surgeon: Nadara Mustard, MD;  Location: [facility] OR;  Service: Orthopedics;  Laterality: Left;  . Tee without cardioversion N/A 10/02/2015    Procedure: TRANSESOPHAGEAL ECHOCARDIOGRAM (TEE);  Surgeon: Chilton Si, MD;  Location: [facility] ENDOSCOPY;  Service: Cardiovascular;  Laterality: N/A;   Family History  Problem Relation Age of Onset  . Hypertension Mother   . Diabetes Mother   . Hypertension Father    Social History  Substance Use Topics  . Smoking status: Never Smoker   . Smokeless tobacco: Never Used  . Alcohol Use: No   OB History    No data available     Review of Systems  Constitutional: Positive for  appetite change. Negative for fever.  HENT: Negative for congestion.   Eyes: Negative for redness.  Respiratory: Negative for shortness of breath.   Cardiovascular: Negative for chest pain.  Gastrointestinal: Negative for nausea, vomiting and abdominal pain.  Musculoskeletal: Negative for back pain and neck pain.  Neurological: Negative for headaches.  Hematological: Does not bruise/bleed easily.      Allergies  Review of patient's allergies indicates no known allergies.  Home Medications   Prior to Admission medications   Medication Sig Start Date End Date Taking? Authorizing Provider  Amino Acids-Protein Hydrolys (PRO-STAT PO) Take 45 mLs by mouth 2 (two) times daily.   Yes Historical Provider, MD  amLODipine (NORVASC) 10 MG tablet TAKE 1 TABLET EVERY DAY 03/26/15  Yes Salley Scarlet, MD  Aripiprazole (ABILIFY) 1 MG/ML mL Take 2.5 mg by mouth daily.    Yes Historical Provider, MD  aspirin EC 325 MG tablet Take 1 tablet (325 mg total) by mouth daily. 07/11/15  Yes Tarry Kos, MD  atenolol (TENORMIN) 50 MG tablet Take 1 tablet (50 mg total) by mouth daily. 03/14/14  Yes Salley Scarlet, MD  cephALEXin (KEFLEX) 500 MG capsule Take 500 mg by mouth 3 (three) times daily. Course from 12/22/2015-01/05/2016   Yes Historical Provider, MD  ferrous sulfate 325 (65 FE) MG tablet Take 325 mg by mouth daily with breakfast.   Yes Historical Provider, MD  imipramine (TOFRANIL) 50 MG tablet Take 1 tablet (50 mg total) by mouth 3 (three) times daily. Patient taking differently: Take 75 mg by mouth 2 (two) times daily.  04/21/15  Yes Salley Scarlet, MD  JANUVIA 25 MG tablet TAKE ONE TABLET BY MOUTH ONCE DAILY 06/04/15  Yes Salley Scarlet, MD  lovastatin (MEVACOR) 10 MG tablet Take 1 tablet (10 mg total) by mouth at bedtime. 05/21/14  Yes Salley Scarlet, MD  metFORMIN (GLUCOPHAGE) 1000 MG tablet TAKE 1 TABLET TWICE DAILY WITH A MEAL Patient taking differently: Take 1,000 mg by mouth 2 (two) times  daily with a meal. TAKE 1 TABLET TWICE DAILY WITH A MEAL 04/21/15  Yes Salley Scarlet, MD  Nutritional Supplements (ENSURE CLEAR PO) Take 1 Can by mouth 2 (two) times daily.   Yes Historical Provider, MD  omeprazole (PRILOSEC) 20 MG capsule Take 20 mg by mouth daily.   Yes Historical Provider, MD  oxyCODONE (OXY IR/ROXICODONE) 5 MG immediate release tablet Take one tablet by mouth every 4 hours as needed for pain 12/05/15  Yes Tiffany L Reed, DO  ciprofloxacin (CIPRO) 500 MG tablet Take 1 tablet (500 mg total) by mouth 2 (two) times daily. 12/26/15   Vanetta Mulders, MD  glucose blood (ACCU-CHEK AVIVA) test strip Use to monitor FSBS 1x daily. Dx: E11.9 12/31/14   Salley Scarlet, MD   BP 132/55 mmHg  Pulse 97  Temp(Src) 97.5 F (36.4  C) (Oral)  Resp 14  SpO2 97% Physical Exam  Constitutional: She appears well-developed and well-nourished. No distress.  HENT:  Head: Normocephalic and atraumatic.  Mouth/Throat: Oropharynx is clear and moist.  Eyes: Conjunctivae and EOM are normal. Pupils are equal, round, and reactive to light.  Neck: Normal range of motion. Neck supple.  Cardiovascular: Normal rate, regular rhythm and normal heart sounds.   Pulmonary/Chest: Effort normal and breath sounds normal. No respiratory distress.  Abdominal: Soft. Bowel sounds are normal. There is no tenderness.  Genitourinary:  Foley catheter in place.  Musculoskeletal:  Left above-knee amputation.  Neurological: She is alert. No cranial nerve deficit. She exhibits normal muscle tone. Coordination normal.  Skin: Skin is warm. No rash noted.  Nursing note and vitals reviewed.   ED Course  Procedures (including critical care time) Labs Review Labs Reviewed  COMPREHENSIVE METABOLIC PANEL - Abnormal; Notable for the following:    Sodium 133 (*)    Glucose, Bld 135 (*)    Albumin 2.5 (*)    ALT 8 (*)    All other components within normal limits  PROTIME-INR - Abnormal; Notable for the following:     Prothrombin Time 17.6 (*)    All other components within normal limits  CBC WITH DIFFERENTIAL/PLATELET - Abnormal; Notable for the following:    Hemoglobin 10.1 (*)    HCT 31.6 (*)    MCH 25.4 (*)    RDW 17.2 (*)    All other components within normal limits  URINALYSIS, ROUTINE W REFLEX MICROSCOPIC (NOT AT [facility]) - Abnormal; Notable for the following:    APPearance HAZY (*)    Specific Gravity, Urine >1.030 (*)    Hgb urine dipstick LARGE (*)    Bilirubin Urine SMALL (*)    Ketones, ur 15 (*)    Protein, ur 100 (*)    Nitrite POSITIVE (*)    Leukocytes, UA MODERATE (*)    All other components within normal limits  URINE MICROSCOPIC-ADD ON - Abnormal; Notable for the following:    Squamous Epithelial / LPF 0-5 (*)    Bacteria, UA MANY (*)    All other components within normal limits  URINE CULTURE  LIPASE, BLOOD   Results for orders placed or performed during the hospital encounter of 12/26/15  Comprehensive metabolic panel  Result Value Ref Range   Sodium 133 (L) 135 - 145 mmol/L   Potassium 4.7 3.5 - 5.1 mmol/L   Chloride 101 101 - 111 mmol/L   CO2 22 22 - 32 mmol/L   Glucose, Bld 135 (H) 65 - 99 mg/dL   BUN 18 6 - 20 mg/dL   Creatinine, Ser 1.61 0.44 - 1.00 mg/dL   Calcium 9.5 8.9 - 09.6 mg/dL   Total Protein 8.0 6.5 - 8.1 g/dL   Albumin 2.5 (L) 3.5 - 5.0 g/dL   AST 16 15 - 41 U/L   ALT 8 (L) 14 - 54 U/L   Alkaline Phosphatase 105 38 - 126 U/L   Total Bilirubin 0.6 0.3 - 1.2 mg/dL   GFR calc non Af Amer >60 >60 mL/min   GFR calc Af Amer >60 >60 mL/min   Anion gap 10 5 - 15  Lipase, blood  Result Value Ref Range   Lipase 18 11 - 51 U/L  Protime-INR  Result Value Ref Range   Prothrombin Time 17.6 (H) 11.6 - 15.2 seconds   INR 1.44 0.00 - 1.49  CBC with Differential/Platelet  Result Value Ref Range  WBC 6.9 4.0 - 10.5 K/uL   RBC 3.97 3.87 - 5.11 MIL/uL   Hemoglobin 10.1 (L) 12.0 - 15.0 g/dL   HCT 40.9 (L) 81.1 - 91.4 %   MCV 79.6 78.0 - 100.0 fL   MCH 25.4  (L) 26.0 - 34.0 pg   MCHC 32.0 30.0 - 36.0 g/dL   RDW 78.2 (H) 95.6 - 21.3 %   Platelets 322 150 - 400 K/uL   Neutrophils Relative % 66 %   Neutro Abs 4.6 1.7 - 7.7 K/uL   Lymphocytes Relative 22 %   Lymphs Abs 1.5 0.7 - 4.0 K/uL   Monocytes Relative 9 %   Monocytes Absolute 0.6 0.1 - 1.0 K/uL   Eosinophils Relative 3 %   Eosinophils Absolute 0.2 0.0 - 0.7 K/uL   Basophils Relative 0 %   Basophils Absolute 0.0 0.0 - 0.1 K/uL  Urinalysis, Routine w reflex microscopic (not at [facility])  Result Value Ref Range   Color, Urine YELLOW YELLOW   APPearance HAZY (A) CLEAR   Specific Gravity, Urine >1.030 (H) 1.005 - 1.030   pH 5.5 5.0 - 8.0   Glucose, UA NEGATIVE NEGATIVE mg/dL   Hgb urine dipstick LARGE (A) NEGATIVE   Bilirubin Urine SMALL (A) NEGATIVE   Ketones, ur 15 (A) NEGATIVE mg/dL   Protein, ur 086 (A) NEGATIVE mg/dL   Nitrite POSITIVE (A) NEGATIVE   Leukocytes, UA MODERATE (A) NEGATIVE  Urine microscopic-add on  Result Value Ref Range   Squamous Epithelial / LPF 0-5 (A) NONE SEEN   WBC, UA TOO NUMEROUS TO COUNT 0 - 5 WBC/hpf   RBC / HPF 6-30 0 - 5 RBC/hpf   Bacteria, UA MANY (A) NONE SEEN   Urine-Other YEAST PRESENT      Imaging Review Dg Chest 1 View  12/26/2015  CLINICAL DATA:  Failure through thrive EXAM: CHEST 1 VIEW COMPARISON:  10/30/2015 FINDINGS: Cardiomediastinal silhouette is stable. No acute infiltrate or pleural effusion. No pulmonary edema. Elevation of the right hemidiaphragm again noted. Degenerative changes thoracic spine the IMPRESSION: No active disease. There is chronic mild elevation of the right hemidiaphragm. Degenerative changes thoracic spine. Electronically Signed   By: Natasha Mead M.D.   On: 12/26/2015 16:21   I have personally reviewed and evaluated these images and lab results as part of my medical decision-making.   EKG Interpretation   Date/Time:  Friday December 26 2015 14:54:42 EST Ventricular Rate:  93 PR Interval:  149 QRS Duration: 128 QT  Interval:  373 QTC Calculation: 464 R Axis:   -63 Text Interpretation:  Sinus rhythm RBBB and LAFB Baseline wander in  lead(s) II Artifact Confirmed by Johnie Makki  MD, Sabin Gibeault ([id number]) on 12/26/2015  3:26:53 PM      MDM   Final diagnoses:  UTI (lower urinary tract infection)   Patient sent in from [facility] for failure to thrive basically patient with decreased appetite. Workup shows evidence of urinary tract infection. Patient reportedly has been on Keflex since the 16th will stop that and start Cipro. Patient given 1 g of IV Rocephin here. Urine sent for culture. Patient nontoxic no acute distress. Chest x-ray negative for pneumonia. No significant leukocytosis. No significant anemia. No significant electrolyte abnormalities. Patient stable for discharge back to [facility].    Vanetta Mulders, MD 12/26/15 1809   Addendum: [facility] refused to take patient back apparently they were under the impression that she was going to be admitted by GI medicine for failure to eat  or drink. Patient did eat some food here. Patient's BUN and creatinine is reasonable appears to be some reasonably hydrated however if she needs additional IV fluid hydration that can occur at the [facility]. Do not think the GI medicine will put the G-tube in earlier than what it's scheduled for which is either January 24 of January 25. However I did agree that we would try to talk to Dr. Dionicia Abler he may or may not know anything about the G-tube because it sounds like mostly communication was by Dr. Darrick Penna. Clinically and based on patient's vital signs she does not have to be admitted today however IV hydration and even IV antibiotics for the urinary tract infection could be done at the [facility]. Apparently patient was on the Keflex for some skin sores and not for urinary tract infection. Switching her to Cipro was appropriate. Would not be appropriate to continue Keflex to treat a UTI when she's been on Keflex. Obviously the  urinary tract infection is resistant to that or not responding to that.  Vanetta Mulders, MD 12/26/15 1922  Discussed with Dr. Dionicia Abler. And he was not privy to the information for the plans from Dr. fields. However he agrees that patient can receive IV fluid hydration over the weekend at [facility]. Agrees that he does not see an indication for admission over the weekend.  Vanetta Mulders, MD 12/26/15 [phone]

## 2015-12-26 NOTE — ED Notes (Signed)
Clydie Braun from [facility] calling and stating that patient was sent here to expedite PEG placement and wanting to talk with EDP.

## 2015-12-26 NOTE — ED Notes (Signed)
Per Dr Deretha [facility] making no changes in scheduling. Pt to return to [facility]. Called report back to Lurena Joiner, will be sending tech to carry pt back to [facility].

## 2015-12-26 NOTE — ED Notes (Signed)
Pt states she has been drinking fluids but she does not have an appetite to ear. States she eats when she gets hungry

## 2015-12-26 NOTE — Progress Notes (Addendum)
Patient ID: Lindsay Cobb, female   DOB: 1941/10/15, 75 y.o.   MRN: [id number]                PROGRESS NOTE  DATE:  12/24/2015   FACILITY: [facility]                     LEVEL OF CARE:   SNF   Acute Visit             CHIEF COMPLAINT:  Continued weight loss.      HISTORY OF PRESENT ILLNESS:  Please see my note of 12/01/2015 in terms of historical review.    The patient continues to eat virtually nothing, per discussion with staff and family.  Her weight is now down to 160.3 pounds, which represents over a 19-pound weight loss in a month and a 3-pound weight loss in the last 10 days.    The patient has been seen by Psychiatry and placed on Abilify 2.5 mg as an adjunct for major depression.  She is on imipramine at 50 b.i.d.      CURRENT MEDICATIONS:  Medication list is reviewed.    LABORATORY DATA:   Her lab work from today:      Sodium 132, potassium 5.1, BUN 24, creatinine 0.77.    Albumin 2.1.    Liver function tests normal.      White count 5.3, hemoglobin 10.5, platelet count 293.    Sedimentation rate 45.    C-reactive protein 8.4.    REVIEW OF SYSTEMS:   Other than weight loss, her review of systems is not really meaningful.    However:   CHEST/RESPIRATORY:  No shortness of breath.  No cough.   GI:  She does not describe dysphagia, odynophagia, or abdominal pain. GU:  No clear dysuria.    MUSCULOSKELETAL:  She is not complaining of back pain.      PHYSICAL EXAMINATION:   GENERAL APPEARANCE:  She continues to lose weight.     CHEST/RESPIRATORY:  Clear air entry bilaterally.    CARDIOVASCULAR:   CARDIAC:  Heart sounds are normal.  She does not appear to be grossly dehydrated.       GASTROINTESTINAL:   ABDOMEN:  No masses are noted.    LIVER/SPLEEN/KIDNEYS:  No liver, no spleen.  No tenderness.     GENITOURINARY:   BLADDER:  Foley catheter in place.   SKIN:   INSPECTION:  Her left gluteal wound is still deep, but contracted.  I restarted the  wound VAC.  Culture here grew methicillin-sensitive Staph aureus.  She is on Keflex.   PSYCHIATRIC:   MENTAL STATUS:  There are very clear cognitive concerns here.  I continue to think that depression is playing a major role here.    ASSESSMENT/PLAN:                 Continued weight loss.  Likely secondary to major depression.  I am going to increase her imipramine to 75 b.i.d.   I wonder whether I might have inadvertently triggered this when I reduced her imipramine due to anticholinergic side effects, possibly contributing to delirium. The possibility of an underlying maliganacy,chronic infection seems also possible.   ?Osteomyelitis.  This is at T10-11.  I will attempt to image this area specifically and get a biopsy and aspirate once I have a PEG tube in place. Blood cultures negative  Her wounds are not healing but stable. Resume the wound vac  Her  diagnostic endoscopy and PEG tube are scheduled for 01/05/2016.  I called the GI office to see if they might have something earlier.  This patient really is losing a tremendous amount of weight and eating minimally

## 2015-12-26 NOTE — ED Notes (Addendum)
Report called to Austin Miles at [facility]. D/c papers and prescriptions reviewed.

## 2015-12-26 NOTE — Discharge Instructions (Signed)
Workup negative except for findings of urinary tract infection. According to his historical records patient is currently on Keflex. Therefore have ordered Cipro. Patient received 1 g of IV Rocephin here in the emergency department. And patient's urine was sent for culture.

## 2015-12-26 NOTE — ED Notes (Signed)
Pt left department by wheelchair escorted by [facility] center staff back to the [facility].

## 2015-12-26 NOTE — Progress Notes (Signed)
Patient ID: TERRY ABILA, female   DOB: 06-16-1941, 75 y.o.   MRN: [id number]                 PROGRESS NOTE  DATE:  12/26/2015   FACILITY: [facility]                     LEVEL OF CARE:   SNF   Acute Visit             CHIEF COMPLAINT--failure to thrive-poor by mouth intake-continued weight loss     HISTORY OF PRESENT ILLNESS:   .  Patient is a pleasant 75 year old female with a very complex medical history.  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  Subsequently she developed a left gluteal ulcer that required at one time a wound VAC which actually has just been restarted by Dr. Leanord Hawking.  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  She has been evaluated by GI actually 2 days ago-I PEG tube was discussed and family patient apparently do desire this-this was apparently discuss extensively by the nurse practitioner when they saw GI.  Nursing has stated that patient has eaten virtually nothing the last couple days which is certainly concerning.  She has lost a significant amount of weight she was 174 back on December 21 most recent weight is 159.6.       The patient has been seen by Psychiatry and placed on Abilify 2.5 mg as an adjunct for major depression.  She is on imipramine at 75 mg b.i.d. --- Dr. Leanord Hawking increase this earlier this week.  Currently her vital signs are stable she does not have any acute complaints although certainly her by mouth intake is of great concern    Family medical social history reviewed per note on 12/01/2015    CURRENT MEDICATIONS:  Medication list is reviewed.    LABORATORY DATA:     Annual 18 2017:      Sodium 132, potassium 5.1, BUN 24, creatinine 0.77.    Albumin 2.1.    Liver function tests normal.      White count 5.3, hemoglobin 10.5,  platelet count 293.    Sedimentation rate 45.    C-reactive protein 8.4.    REVIEW OF SYSTEMS:   Other than weight loss, her review of systems is not really meaningful.    H:   CHEST/RESPIRATORY:  No shortness of breath.  No cough Cardiac-no chest pain.   GI:  She does not describe dysphagia, odynophagia, or abdominal pain. GU:  No clear dysuria.    MUSCULOSKELETAL:  She is not complaining of back pain Neurologic is not complaining of dizziness or syncopal-type feelings.      PHYSICAL EXAMINATION:   Temperature 98.2 pulse 90 respirations 18 blood pressure 145/74 weight is 159.6 GENERAL APPEARANCE:  Frail elderly female in no distress sitting in her wheelchair.  Her skin is warm and dry    CHEST/RESPIRATORY:  Clear air entry bilaterally.    CARDIOVASCULAR:   CARDIAC:  Heart sounds are regular with some occasional irregular beats.  She does not appear to be grossly dehydrated.       GASTROINTESTINAL:   ABDOMEN:   Abdomen is soft nontender with positive bowel sounds     .   SKIN:   INSPECTION:  Her left gluteal wound Was  not assessed apparently Dr. Leanord Hawking has restarted her wound VAC   PSYCHIATRIC:   MENTAL STATUS:   She is pleasant although appears to have some cognitive deficits-Dr. Leanord Hawking is concerned about depression    ASSESSMENT/PLAN:                 Continued weight loss. Combined with very minimal by mouth intake last couple days-nursing has discussed this with Dr. Leanord Hawking as well-family does desire a PEG tube-will send her to the ER for admission for what I suspect will be PEG tube placement-Dr. Leanord Hawking continues to be deeply concerned about patient's status with very minimal by mouth intake--nursing will make ER aware of patient's status  [phone]

## 2015-12-28 ENCOUNTER — Encounter: Payer: Self-pay | Admitting: Internal Medicine

## 2015-12-29 LAB — URINE CULTURE

## 2015-12-30 ENCOUNTER — Inpatient Hospital Stay
Admission: RE | Admit: 2015-12-30 | Discharge: 2016-01-07 | Disposition: E | Payer: Commercial Managed Care - HMO | Source: Ambulatory Visit | Attending: Internal Medicine | Admitting: Internal Medicine

## 2015-12-30 ENCOUNTER — Encounter (HOSPITAL_COMMUNITY): Payer: Self-pay | Admitting: *Deleted

## 2015-12-30 ENCOUNTER — Encounter (HOSPITAL_COMMUNITY)
Admission: RE | Disposition: A | Discharge status: Skilled Nursing Facility | Payer: Self-pay | Source: Ambulatory Visit | Attending: Gastroenterology

## 2015-12-30 ENCOUNTER — Ambulatory Visit (HOSPITAL_COMMUNITY)
Admission: RE | Admit: 2015-12-30 | Discharge: 2015-12-30 | Disposition: A | Discharge status: Skilled Nursing Facility | Payer: Commercial Managed Care - HMO | Source: Ambulatory Visit | Attending: Gastroenterology | Admitting: Gastroenterology

## 2015-12-30 DIAGNOSIS — I10 Essential (primary) hypertension: Secondary | ICD-10-CM | POA: Insufficient documentation

## 2015-12-30 DIAGNOSIS — R1111 Vomiting without nausea: Principal | ICD-10-CM

## 2015-12-30 DIAGNOSIS — K2970 Gastritis, unspecified, without bleeding: Secondary | ICD-10-CM | POA: Diagnosis not present

## 2015-12-30 DIAGNOSIS — Z7982 Long term (current) use of aspirin: Secondary | ICD-10-CM | POA: Diagnosis not present

## 2015-12-30 DIAGNOSIS — R634 Abnormal weight loss: Secondary | ICD-10-CM | POA: Diagnosis not present

## 2015-12-30 DIAGNOSIS — K2950 Unspecified chronic gastritis without bleeding: Secondary | ICD-10-CM | POA: Diagnosis not present

## 2015-12-30 DIAGNOSIS — Z89612 Acquired absence of left leg above knee: Secondary | ICD-10-CM | POA: Insufficient documentation

## 2015-12-30 DIAGNOSIS — F329 Major depressive disorder, single episode, unspecified: Secondary | ICD-10-CM | POA: Diagnosis not present

## 2015-12-30 DIAGNOSIS — M1991 Primary osteoarthritis, unspecified site: Secondary | ICD-10-CM | POA: Insufficient documentation

## 2015-12-30 DIAGNOSIS — Z7984 Long term (current) use of oral hypoglycemic drugs: Secondary | ICD-10-CM | POA: Diagnosis not present

## 2015-12-30 DIAGNOSIS — E785 Hyperlipidemia, unspecified: Secondary | ICD-10-CM | POA: Diagnosis not present

## 2015-12-30 DIAGNOSIS — Z79899 Other long term (current) drug therapy: Secondary | ICD-10-CM | POA: Insufficient documentation

## 2015-12-30 DIAGNOSIS — R1310 Dysphagia, unspecified: Secondary | ICD-10-CM | POA: Diagnosis not present

## 2015-12-30 DIAGNOSIS — K224 Dyskinesia of esophagus: Secondary | ICD-10-CM

## 2015-12-30 DIAGNOSIS — E119 Type 2 diabetes mellitus without complications: Secondary | ICD-10-CM | POA: Insufficient documentation

## 2015-12-30 HISTORY — PX: PEG PLACEMENT: SHX5437

## 2015-12-30 HISTORY — PX: ESOPHAGOGASTRODUODENOSCOPY: SHX5428

## 2015-12-30 LAB — GLUCOSE, CAPILLARY: Glucose-Capillary: 137 mg/dL — ABNORMAL HIGH (ref 65–99)

## 2015-12-30 SURGERY — EGD (ESOPHAGOGASTRODUODENOSCOPY)
Anesthesia: Moderate Sedation

## 2015-12-30 MED ORDER — LIDOCAINE 1 % OPTIME INJ - NO CHARGE
INTRAMUSCULAR | Status: DC | PRN
  Administered 2015-12-30: 5 mL

## 2015-12-30 MED ORDER — MIDAZOLAM HCL 5 MG/5ML IJ SOLN
INTRAMUSCULAR | Status: AC
  Filled 2015-12-30: qty 10

## 2015-12-30 MED ORDER — TOPICALS DOCUMENTATION OPTIME
TOPICAL | Status: DC | PRN
  Administered 2015-12-30: 1 via TOPICAL

## 2015-12-30 MED ORDER — OMEPRAZOLE 20 MG PO CPDR: 20.0000 mg | DELAYED_RELEASE_CAPSULE | Freq: Two times a day (BID) | ORAL | Status: AC

## 2015-12-30 MED ORDER — SODIUM CHLORIDE 0.9 % IV SOLN
INTRAVENOUS | Status: DC
  Administered 2015-12-30: 14:00:00 via INTRAVENOUS

## 2015-12-30 MED ORDER — MIDAZOLAM HCL 5 MG/5ML IJ SOLN
INTRAMUSCULAR | Status: DC | PRN
  Administered 2015-12-30 (×3): 1 mg via INTRAVENOUS
  Administered 2015-12-30: 2 mg via INTRAVENOUS

## 2015-12-30 MED ORDER — CEFAZOLIN SODIUM 1 G IJ SOLR
INTRAMUSCULAR | Status: DC | PRN
  Administered 2015-12-30: 1 g via INTRAMUSCULAR

## 2015-12-30 MED ORDER — LIDOCAINE VISCOUS 2 % MT SOLN
OROMUCOSAL | Status: DC | PRN
  Administered 2015-12-30: 4 mL via OROMUCOSAL

## 2015-12-30 MED ORDER — MEPERIDINE HCL 100 MG/ML IJ SOLN
INTRAMUSCULAR | Status: AC
  Filled 2015-12-30: qty 2

## 2015-12-30 MED ORDER — MEPERIDINE HCL 100 MG/ML IJ SOLN
INTRAMUSCULAR | Status: DC | PRN
  Administered 2015-12-30 (×4): 25 mg via INTRAVENOUS

## 2015-12-30 MED ORDER — CEFAZOLIN SODIUM 1-5 GM-% IV SOLN
1.0000 g | Freq: Once | INTRAVENOUS | Status: AC
  Administered 2015-12-30: 1 g via INTRAVENOUS

## 2015-12-30 MED ORDER — CEFAZOLIN (ANCEF) 1 G IV SOLR: 2.0000 g | INTRAVENOUS | Status: DC

## 2015-12-30 MED ORDER — CEFAZOLIN SODIUM-DEXTROSE 2-3 GM-% IV SOLR
INTRAVENOUS | Status: AC
  Filled 2015-12-30: qty 50

## 2015-12-30 MED ORDER — LIDOCAINE VISCOUS 2 % MT SOLN
OROMUCOSAL | Status: AC
  Filled 2015-12-30: qty 15

## 2015-12-30 NOTE — H&P (Addendum)
Primary Care Physician:  [name], MD Primary Gastroenterologist:  Dr. Darrick Penna  Pre-Procedure History & Physical: HPI:  Lindsay Cobb is a 75 y.o. female here for Unable To maintain ADEQUATE NUTRITION.  Past Medical History  Diagnosis Date  . Diabetes mellitus     takes Januvia,Metformin,and Glipizide daily  . Hyperlipidemia     takes Lovastatin nightly  . Anemia     takes Ferrous Gluconate daily  . Peripheral edema     takes Furosemide daily as needed  . Depression     takes Tofranil daily  . Hypertension     takes Amlodipine,Atenolol,and Benazepril daily  . Arthritis   . Joint pain   . Joint swelling   . Cataracts, bilateral     immature  . Infection of skin of knee     left  . 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 Breast biopsy Left   . Colonoscopy  2008    [place]: hemorrhoids  . Esophagogastroduodenoscopy    . Ercp    . Patellectomy Left 07/11/2015    Procedure: Left Partial PATELLECTOMY;  Surgeon: Tarry Kos, MD;  Location: [facility] OR;  Service: Orthopedics;  Laterality: Left;  . Irrigation and debridement knee Left 08/15/2015    Procedure: IRRIGATION AND DEBRIDEMENT LEFT KNEE;  Surgeon: Tarry Kos, MD;  Location: [facility] OR;  Service: Orthopedics;  Laterality: Left;  . Patellectomy Left 08/15/2015    Procedure: Revision PATELLECTOMY;  Surgeon: Tarry Kos, MD;  Location: [facility] OR;  Service: Orthopedics;  Laterality: Left;  . I&d extremity Left 08/18/2015    Procedure: IRRIGATION AND DEBRIDEMENT EXTREMITY;  Surgeon: Tarry Kos, MD;  Location: [facility] OR;  Service: Orthopedics;  Laterality: Left;  Marland Kitchen Minor application of wound vac  08/18/2015    Procedure: MINOR APPLICATION OF WOUND VAC;  Surgeon: Tarry Kos, MD;  Location: [facility] OR;  Service: Orthopedics;;  . Irrigation and debridement knee Left 08/20/2015    Procedure: IRRIGATION AND DEBRIDEMENT KNEE WITH PLACEMENT OF INTEGRA;  Surgeon: Tarry Kos,  MD;  Location: [facility] OR;  Service: Orthopedics;  Laterality: Left;  . I&d extremity Left 09/10/2015    Procedure: LEFT KNEE SPLIT THICKNESS SKIN GRAFT, IRRIGATION AND DEBRIDEMENT, WOUND VAC;  Surgeon: Tarry Kos, MD;  Location: [facility];  Service: Orthopedics;  Laterality: Left;  . Skin split graft Left 09/10/2015    Procedure: SKIN GRAFT SPLIT THICKNESS;  Surgeon: Tarry Kos, MD;  Location: [facility];  Service: Orthopedics;  Laterality: Left;  . Application of wound vac Left 09/10/2015    Procedure: APPLICATION OF WOUND VAC;  Surgeon: Tarry Kos, MD;  Location: [facility];  Service: Orthopedics;  Laterality: Left;  . I&d extremity Left 09/12/2015    Procedure: IRRIGATION AND DEBRIDEMENT LEFT KNEE WITH PLACEMENT OF WOUND VAC;  Surgeon: Tarry Kos, MD;  Location: [facility];  Service: Orthopedics;  Laterality: Left;  . Amputation Left 09/28/2015    Procedure: AMPUTATION ABOVE KNEE;  Surgeon: Nadara Mustard, MD;  Location: [facility];  Service: Orthopedics;  Laterality: Left;  . Tee without cardioversion N/A 10/02/2015    Procedure: TRANSESOPHAGEAL ECHOCARDIOGRAM (TEE);  Surgeon: Chilton Si, MD;  Location: [facility] ENDOSCOPY;  Service: Cardiovascular;  Laterality: N/A;    Prior to Admission medications   Medication Sig Start Date End Date Taking? Authorizing Provider  Amino Acids-Protein Hydrolys (PRO-STAT PO) Take 45 mLs by mouth 2 (two) times daily.  Yes Historical Provider, MD  amLODipine (NORVASC) 10 MG tablet TAKE 1 TABLET EVERY DAY 03/26/15  Yes Salley Scarlet, MD  Aripiprazole (ABILIFY) 1 MG/ML mL Take 2.5 mg by mouth daily.    Yes Historical Provider, MD  aspirin EC 325 MG tablet Take 1 tablet (325 mg total) by mouth daily. 07/11/15  Yes Tarry Kos, MD  atenolol (TENORMIN) 50 MG tablet Take 1 tablet (50 mg total) by mouth daily. 03/14/14  Yes Salley Scarlet, MD  cephALEXin (KEFLEX) 500 MG capsule Take 500 mg by mouth 3 (three) times daily. Course from 12/22/2015-01/05/2016   Yes Historical Provider, MD   ciprofloxacin (CIPRO) 500 MG tablet Take 1 tablet (500 mg total) by mouth 2 (two) times daily. 12/26/15  Yes Vanetta Mulders, MD  ferrous sulfate 325 (65 FE) MG tablet Take 325 mg by mouth daily with breakfast.    Historical Provider, MD  glucose blood (ACCU-CHEK AVIVA) test strip Use to monitor FSBS 1x daily. Dx: E11.9 12/31/14   Salley Scarlet, MD  imipramine (TOFRANIL) 50 MG tablet Take 1 tablet (50 mg total) by mouth 3 (three) times daily. Patient taking differently: Take 75 mg by mouth 2 (two) times daily.  04/21/15   Salley Scarlet, MD  JANUVIA 25 MG tablet TAKE ONE TABLET BY MOUTH ONCE DAILY 06/04/15   Salley Scarlet, MD  lovastatin (MEVACOR) 10 MG tablet Take 1 tablet (10 mg total) by mouth at bedtime. 05/21/14   Salley Scarlet, MD  metFORMIN (GLUCOPHAGE) 1000 MG tablet TAKE 1 TABLET TWICE DAILY WITH A MEAL Patient taking differently: Take 1,000 mg by mouth 2 (two) times daily with a meal. TAKE 1 TABLET TWICE DAILY WITH A MEAL 04/21/15   Salley Scarlet, MD  Nutritional Supplements (ENSURE CLEAR PO) Take 1 Can by mouth 2 (two) times daily.    Historical Provider, MD  omeprazole (PRILOSEC) 20 MG capsule Take 20 mg by mouth daily.    Historical Provider, MD  oxyCODONE (OXY IR/ROXICODONE) 5 MG immediate release tablet Take one tablet by mouth every 4 hours as needed for pain 12/05/15   Tiffany L Reed, DO    Allergies as of 12/23/2015  . (No Known Allergies)    Family History  Problem Relation Age of Onset  . Hypertension Mother   . Diabetes Mother   . Hypertension Father     Social History   Social History  . Marital Status: Married    Spouse Name: N/A  . Number of Children: N/A  . Years of Education: N/A   Occupational History  . Not on file.   Social History Main Topics  . Smoking status: Never Smoker   . Smokeless tobacco: Never Used  . Alcohol Use: No  . Drug Use: No  . Sexual Activity: Not on file   Other Topics Concern  . Not on file   Social History  Narrative    Review of Systems: See HPI, otherwise negative ROS   Physical Exam: BP 127/68 mmHg  Pulse 107  Temp(Src) 98 F (36.7 C) (Oral)  Resp 18  SpO2 98% General:   Alert,  pleasant and cooperative in NAD Head:  Normocephalic and atraumatic. Neck:  Supple; Lungs:  Clear throughout to auscultation.    Heart:  Regular rate and rhythm. Abdomen:  Soft, nontender and nondistended. Normal bowel sounds, without guarding, and without rebound.   Neurologic:  Alert and  oriented x4;  grossly normal neurologically.  Impression/Plan:    Unable  To maintain ADEQUATE NUTRITION  PLAN: 1. PEG TODAY. DISCUSSED PROCEDURE, BENEFITS, & RISKS: < 1% chance of medication reaction, colon perforation requiring surgery, need for surgery if PEG IS PULLED OUT IN THE NEXT 6 WEEKS, OR bleeding.

## 2015-12-30 NOTE — Op Note (Addendum)
[facility] [address] [place], [address]   EGD WITH PEG PROCEDURE REPORT        EXAM DATE: 12/28/2015  PATIENT NAME:          Lindsay Cobb, Lindsay Cobb          MR #: [id number] BIRTHDATE:       [birth year]     VISIT #:     [phone] ATTENDING:     West Bali, MD     STATUS:     outpatient ASSISTANT:  INDICATIONS:  The patient is a 75 yr old female here for an EGD with PEG due to dysphagia. PROCEDURE PERFORMED:     EGD with biopsy EGD with PEG placement  MEDICATIONS:     Demerol 100 mg IV and Versed 5 mg IV  MD initated sedation: 1402. Procedure end: 1458 TOPICAL ANESTHETIC:     Viscous Xylocaine  CONSENT: The patient understands the risks and benefits of the procedure and understands that these risks include, but are not limited to: sedation, allergic reaction, infection, perforation and/or bleeding. Alternative means of evaluation and treatment include, among others: physical exam, x-rays, and/or surgical intervention. The patient elects to proceed with this endoscopic procedure.  DESCRIPTION OF PROCEDURE: During intra-op preparation period all mechanical & medical equipment was checked for proper function. Hand hygiene and appropriate measures for infection prevention was taken. After the risks, benefits and alternatives of the procedure were thoroughly explained, Informed consent was verified, confirmed and timeout was successfully executed by the treatment team. The patient was anesthetized with topical anesthesia and the EG-[id number] ([id number]) endoscope was introduced through the mouth and advanced to the second portion of the duodenum.  The instrument was slowly withdrawn as the mucosa was fully examined.  ESOPHAGUS: The mucosa of the esophagus appeared normal.   STOMACH: Mild non-erosive gastritis (inflammation) was found in the gastric body and gastric antrum.  Multiple biopsies were performed using cold forceps. NORMAL SECOND PORTION OF THE  DUODENUM.  The stomach was then inflated with air, and by a combination of transillumination and manual palpation, the site for the gastrostomy tube placement was selected and marked on the anterior abdominal wall.  The skin of the anterior abdomen was surgically prepped and draped with sterile towels.  Utilizing strict sterile technique, the selected site was then anesthetized with 1% xylocaine by injection into the skin and subcutaneous tissue.  A 1 cm incision was made through the skin and subcutaneous tissue, and the needle/cannula assembly was then passed through the abdominal wall and through the anterior wall of the stomach, maintaining visualization with the endoscope.  A snare device previously placed through the instrument channel was then opened and placed around the cannula, the needle was removed, and the insertion wire was passed through the cannula and into the stomach lumen.  The snare was then loosened from the cannula, and repositioned to snare the insertion wire.  The snare was then pulled up to the endoscope distal tip, and the scope was then withdrawn bringing with it the snare and insertion wire.  The insertion wire was then released from the snare, and then loop-attached to the 30F BS Push PEG gastrostomy tube.  Using the "pull technique", the G-tube was then pulled into place by traction on the insertion wire at the abdominal wall end.        The G-tube insertion site was then cleansed once again, and the external bolster was placed over the tube to secure it  to the abdominal wall.  A sterile dressing was then applied, and the procedure terminated. Estimated blood loss is zero unless otherwise noted in this procedure report. The gastroscope was then slowly withdrawn and removed.    ADVERSE EVENT:     There were no immediate complications. IMPRESSIONS:     Distal gastritis  RECOMMENDATIONS:     The skin incision is at 4 cm and the top of the bumper is at  6 cm. RESUME PREVIOUS DIET. START OMEPRAZOLE 30 MINUTES PRIOR TO MEALS TWICE DAILY FOR 3 MOS THEN ONCE DAILY FOR THE NEXT YEAR. REPEAT EXAM:   ___________________________________ West Bali, MD eSigned:  West Bali, MD 01-21-2016 4:14 PM Revised: Jan 21, 2016 4:14 PM  cc: Baltazar Najjar, M.D.  CPT CODES: ICD CODES:  The ICD and CPT codes recommended by this software are interpretations from the data that the clinical staff has captured with the software.  The verification of the translation of this report to the ICD and CPT codes and modifiers is the sole responsibility of the health care institution and practicing physician where this report was generated.  [employer] will not be held responsible for the validity of the ICD and CPT codes included on this report.  AMA assumes no liability for data contained or not contained herein. CPT is a Publishing rights manager of the [employer].  PATIENT NAME:  Lindsay Cobb, Lindsay Cobb MR#: [id number]

## 2015-12-30 NOTE — Discharge Instructions (Signed)
You have gastritis. I biopsied your stomach. Your tube has been placed.  The skin incision is at 4 cm and the top of the bumper is at 6 cm.   RESUME PREVIOUS DIET.  START OMEPRAZOLE 30 MINUTES PRIOR TO MEALS TWICE DAILY FOR 3 MOS THEN ONCE DAILY FOR THE NEXT YEAR.  PLEASE CALL WITH QUESTIONS OR CONCERNS.   UPPER ENDOSCOPY AFTER CARE Read the instructions outlined below and refer to this sheet in the next week. These discharge instructions provide you with general information on caring for yourself after you leave the hospital. While your treatment has been planned according to the most current medical practices available, unavoidable complications occasionally occur. If you have any problems or questions after discharge, call DR. Cattleya Dobratz, [phone].  ACTIVITY  You may resume your regular activity, but move at a slower pace for the next 24 hours.   Take frequent rest periods for the next 24 hours.   Walking will help get rid of the air and reduce the bloated feeling in your belly (abdomen).   No driving for 24 hours (because of the medicine (anesthesia) used during the test).   You may shower.   Do not sign any important legal documents or operate any machinery for 24 hours (because of the anesthesia used during the test).    NUTRITION  Drink plenty of fluids.   You may resume your normal diet as instructed by your doctor.   Begin with a light meal and progress to your normal diet. Heavy or fried foods are harder to digest and may make you feel sick to your stomach (nauseated).   Avoid alcoholic beverages for 24 hours or as instructed.    MEDICATIONS  You may resume your normal medications.   WHAT YOU CAN EXPECT TODAY  Some feelings of bloating in the abdomen.   Passage of more gas than usual.    IF YOU HAD A BIOPSY TAKEN DURING THE UPPER ENDOSCOPY:  Eat a soft diet IF YOU HAVE NAUSEA, BLOATING, ABDOMINAL PAIN, OR VOMITING.    FINDING OUT THE RESULTS OF YOUR  TEST Not all test results are available during your visit. DR. Darrick Penna WILL CALL YOU WITHIN 14 DAYS OF YOUR PROCEDUE WITH YOUR RESULTS. Do not assume everything is normal if you have not heard from DR. Fulton Merry, CALL HER OFFICE AT [phone].  SEEK IMMEDIATE MEDICAL ATTENTION AND CALL THE OFFICE: [phone] IF:  You have more than a spotting of blood in your stool.   Your belly is swollen (abdominal distention).   You are nauseated or vomiting.   You have a temperature over 101F.   You have abdominal pain or discomfort that is severe or gets worse throughout the day.   Gastritis  Gastritis is an inflammation (the body's way of reacting to injury and/or infection) of the stomach. It is often caused by viral or bacterial (germ) infections. It can also be caused BY ASPIRIN, BC/GOODY POWDER'S, (IBUPROFEN) MOTRIN, OR ALEVE (NAPROXEN), chemicals (including alcohol), SPICY FOODS, and medications. This illness may be associated with generalized malaise (feeling tired, not well), UPPER ABDOMINAL STOMACH cramps, and fever. One common bacterial cause of gastritis is an organism known as H. Pylori. This can be treated with antibiotics.

## 2015-12-31 ENCOUNTER — Other Ambulatory Visit (HOSPITAL_COMMUNITY)
Admission: RE | Admit: 2015-12-31 | Discharge: 2015-12-31 | Disposition: A | Discharge status: Home / Self Care | Payer: Commercial Managed Care - HMO | Source: Skilled Nursing Facility | Attending: Internal Medicine | Admitting: Internal Medicine

## 2015-12-31 ENCOUNTER — Non-Acute Institutional Stay (SKILLED_NURSING_FACILITY): Payer: Commercial Managed Care - HMO | Admitting: Internal Medicine

## 2015-12-31 ENCOUNTER — Ambulatory Visit (HOSPITAL_COMMUNITY): Payer: Commercial Managed Care - HMO

## 2015-12-31 DIAGNOSIS — R627 Adult failure to thrive: Secondary | ICD-10-CM | POA: Diagnosis not present

## 2015-12-31 DIAGNOSIS — Z931 Gastrostomy status: Secondary | ICD-10-CM | POA: Diagnosis not present

## 2015-12-31 DIAGNOSIS — R1111 Vomiting without nausea: Secondary | ICD-10-CM | POA: Diagnosis present

## 2015-12-31 LAB — COMPREHENSIVE METABOLIC PANEL
ALBUMIN: 2.2 g/dL — AB (ref 3.5–5.0)
ALK PHOS: 96 U/L (ref 38–126)
ALT: 8 U/L — AB (ref 14–54)
AST: 16 U/L (ref 15–41)
Anion gap: 10 (ref 5–15)
BUN: 22 mg/dL — ABNORMAL HIGH (ref 6–20)
CALCIUM: 9.1 mg/dL (ref 8.9–10.3)
CO2: 21 mmol/L — AB (ref 22–32)
CREATININE: 1.06 mg/dL — AB (ref 0.44–1.00)
Chloride: 100 mmol/L — ABNORMAL LOW (ref 101–111)
GFR calc non Af Amer: 51 mL/min — ABNORMAL LOW (ref >60)
GFR, EST AFRICAN AMERICAN: 59 mL/min — AB (ref >60)
GLUCOSE: 173 mg/dL — AB (ref 65–99)
Potassium: 5 mmol/L (ref 3.5–5.1)
SODIUM: 131 mmol/L — AB (ref 135–145)
Total Bilirubin: 0.5 mg/dL (ref 0.3–1.2)
Total Protein: 6.9 g/dL (ref 6.5–8.1)

## 2015-12-31 LAB — CBC WITH DIFFERENTIAL/PLATELET
Basophils Absolute: 0 10*3/uL (ref 0.0–0.1)
Basophils Relative: 0 %
EOS ABS: 0.1 10*3/uL (ref 0.0–0.7)
Eosinophils Relative: 1 %
HCT: 33.7 % — ABNORMAL LOW (ref 36.0–46.0)
HEMOGLOBIN: 10.7 g/dL — AB (ref 12.0–15.0)
LYMPHS ABS: 0.7 10*3/uL (ref 0.7–4.0)
LYMPHS PCT: 7 %
MCH: 25.4 pg — AB (ref 26.0–34.0)
MCHC: 31.8 g/dL (ref 30.0–36.0)
MCV: 80 fL (ref 78.0–100.0)
Monocytes Absolute: 0.5 10*3/uL (ref 0.1–1.0)
Monocytes Relative: 5 %
NEUTROS ABS: 9 10*3/uL — AB (ref 1.7–7.7)
NEUTROS PCT: 87 %
Platelets: 377 10*3/uL (ref 150–400)
RBC: 4.21 MIL/uL (ref 3.87–5.11)
RDW: 17.2 % — ABNORMAL HIGH (ref 11.5–15.5)
WBC: 10.2 10*3/uL (ref 4.0–10.5)

## 2015-12-31 NOTE — Progress Notes (Signed)
Patient ID: Lindsay Cobb, female   DOB: Apr 08, 1941, 75 y.o.   MRN: [id number]                  PROGRESS NOTE  DATE:  12/31/2015   FACILITY: [facility]                     LEVEL OF CARE:   SNF   Acute Visit             CHIEF COMPLAINT--acute visit follow-up failure to thrive-status post PEG tube placement    HISTORY OF PRESENT ILLNESS:   .  Patient is a pleasant 75 year old female with a very complex medical history.  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  Subsequently she developed a left gluteal ulcer that required at one time a wound VAC which actually has just been restarted by Dr. Leanord Hawking.  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  She has been evaluated by GI -I PEG tube was discussed and family patient apparently do desire this-this was apparently discuss extensively by the nurse practitioner when they saw GI.    She has lost a significant amount of weight she was 174 back on December 21 most recent weight is 159.3.   Patient did receive a PEG tube placement and has returned the facility.  She also did have an EGD done and was diagnosed with gastritis duodenitis Heidel hernia it appears she is return to the facility appears to be doing okay although she is really not eating much at all.  PEG feedings are being started.  Currently she has no complaints.  It appears at one point she was started on Cipro as well for a possible UTI however culture has grown out multiple pathogens none predominant--she has been afebrile is not complaining of dysuria  Family medical social history reviewed per note on 12/01/2015    CURRENT MEDICATIONS:  Medication list is reviewed.     12/26/2015.  Sodium 133 potassium 4.7 BUN 18 creatinine 0.77.  Albumin 2.5-ALT 8-otherwise liver  function tests within normal limits.  WBC 6.9 hemoglobin 10.1 platelets 322  LABORATORY DATA:     Dec 24 2015:      Sodium 132, potassium 5.1, BUN 24, creatinine 0.77.    Albumin 2.1.    Liver function tests normal.      White count 5.3, hemoglobin 10.5, platelet count 293.    Sedimentation rate 45.    C-reactive protein 8.4.    REVIEW OF SYSTEMS:   Other than weight loss, her review of systems is not really meaningful.    H:   CHEST/RESPIRATORY:  No shortness of breath.  No cough Cardiac-no chest pain.  --Does have a history of constipation at times GI:  She does not describe dysphagia, odynophagia, or abdominal pain. GU:  No clear dysuria.    MUSCULOSKELETAL:  She is not complaining of back pain Neurologic is not complaining of dizziness or syncopal-type feelings.      PHYSICAL EXAMINATION:  Temperature 98.4 pulse 89 respirations 26 blood pressure 126/51 weight is 159.3 GENERAL APPEARANCE:  Frail elderly female in no distress lying comfortably in bed  Her skin is warm and dry    CHEST/RESPIRATORY:  Clear air entry bilaterally.    CARDIOVASCULAR:  CARDIAC:  Heart sounds are regular   she does  not have significant lower extremity edema.       GASTROINTESTINAL:   ABDOMEN:   Abdomen is soft nontender with positive bowel sounds though somewhat reduced-PEG site appears unremarkable GU does not appear to have any suprapubic tenderness     .   SKIN:   INSPECTION:  Her  left gluteal wound Was not assessed Musculoskeletal moves all extremities 4 she is status post left AKA.  Neurologic appears to be grossly intact    PSYCHIATRIC:   MENTAL STATUS:   She is pleasant although appears to have some cognitive deficits-    ASSESSMENT/PLAN:                 History of failure to thrive with continued weight loss-she is now status post peg tube-tube feedings have been started-will obtain updated labs to make sure renal function electrolytes remained stable especially with  initiation of her tube feedings.  Clinically she appears very frail but not unstable-  Also will obtain an abdominal x-ray secondary to PEG tube placement and somewhat hypoactive bowel sounds.  Regards to question UTI again culture has grown out multiple pathogens none predominant -she has completed 5 days of Cipro she does not complain of dysuria she has been afebrile no complaints of back pain-we'll discontinue this- she does continue on Keflex for her wound issues  [phone]

## 2016-01-02 ENCOUNTER — Encounter (HOSPITAL_COMMUNITY): Payer: Self-pay | Admitting: Gastroenterology

## 2016-01-02 ENCOUNTER — Other Ambulatory Visit: Payer: Self-pay | Admitting: Licensed Clinical Social Worker

## 2016-01-02 NOTE — Patient Outreach (Signed)
  [facility]) Care Management  [facility] Social Work  01/02/2016  Lindsay Cobb August 05, 1941 [id number]  Subjective:    Objective:   Current Medications:  No current outpatient prescriptions on file.   No current facility-administered medications for this visit.    Functional Status:  In your present state of health, do you have any difficulty performing the following activities: 10/15/2015 10/07/2015  Hearing? N N  Vision? N N  Difficulty concentrating or making decisions? Lindsay Cobb  Walking or climbing stairs? Y Y  Dressing or bathing? N Y  Doing errands, shopping? Lindsay Cobb  Preparing Food and eating ? - Y  Using the Toilet? - N  In the past six months, have you accidently leaked urine? - N  Do you have problems with loss of bowel control? - N  Managing your Medications? - Y  Managing your Finances? - Y  Housekeeping or managing your Housekeeping? - Y    Fall/Depression Screening:  PHQ 2/9 Scores 10/14/2015 10/07/2015 10/03/2015 05/21/2014  PHQ - 2 Score _0 0  PHQ- 9 Score _1 -    Assessment:   CSW traveled to [facility] on 01/02/16 to visit with client. CSW met with client on 01/02/16 at room of client at [facility]. Patient assessed in  [place] for continued care needs. CSW will continue to collaborate with the skilled nursing facility social worker to facilitate discharge planning needs and communicate with the patient and family. Client is receiving nursing care support at [facility]. Client recently had PEG tube placed at [facility].  Client had been losing weight for several months. Client is receiving medications as prescribed. Client is sleeping well. She has support from Lindsay Cobb, her sister. Her husband, Lindsay Cobb, is also supportive.  She did not mention any pain issues at this time.  However, client is receiving skin care also through nursing services at [facility]. Client is receiving some physical  therapy sessions as she is able to participate.  She said she has reduced energy at present. Again, due to recent PEG tube placement, client is now receiving nutrition via PEG tube as ordered. Client and CSW discussed client care plan. Client agreed that she would try to cooperate with care providers for client at [facility] for next 30 days.  CSW encouraged client or her family representative to call CSW at [phone] to discuss social work needs of client. CSW thanked Lindsay Cobb for allowing CSW to visit with her at [facility] on 01/02/16.   Plan:  Client to cooperate with care providers for client at [facility] for next 30 days. CSW to call client in 3 weeks to assess needs of client at that time.  Lindsay Cobb.Lindsay Cobb MSW, LCSW Licensed Clinical Social Worker [facility] Care Management [phone]

## 2016-01-03 ENCOUNTER — Non-Acute Institutional Stay (SKILLED_NURSING_FACILITY): Payer: Commercial Managed Care - HMO | Admitting: Internal Medicine

## 2016-01-03 DIAGNOSIS — R627 Adult failure to thrive: Secondary | ICD-10-CM

## 2016-01-03 DIAGNOSIS — R634 Abnormal weight loss: Secondary | ICD-10-CM | POA: Diagnosis not present

## 2016-01-03 DIAGNOSIS — F05 Delirium due to known physiological condition: Secondary | ICD-10-CM | POA: Diagnosis not present

## 2016-01-03 DIAGNOSIS — R410 Disorientation, unspecified: Secondary | ICD-10-CM

## 2016-01-05 ENCOUNTER — Encounter: Payer: Self-pay | Admitting: Internal Medicine

## 2016-01-05 DIAGNOSIS — R627 Adult failure to thrive: Secondary | ICD-10-CM | POA: Insufficient documentation

## 2016-01-06 NOTE — Progress Notes (Addendum)
Patient ID: Lindsay Cobb, female   DOB: Jan 17, 1941, 75 y.o.   MRN: [id number]                PROGRESS NOTE  DATE:  01/03/2016         FACILITY: [facility]                  LEVEL OF CARE:   SNF   Acute Visit                    CHIEF COMPLAINT:  General review.      HISTORY OF PRESENT ILLNESS:  This is a patient who finally has a PEG tube in place.  She had stopped eating.  The etiology of this was felt to be major depression, at least by me.  If there was another medical issue, I did not identify it.  I had asked for a diagnostic endoscopy at the time of her PEG tube placement, although I do not exactly see this result.    Noted, according to the nursing staff, that she is drinking but not eating.  The PEG tube seems to be functioning well.  Her proton pump inhibitor was changed to omeprazole 20 p.o. b.i.d. by GI.  Note that her weight loss has stabilized.  She had gone up to 163 pounds yesterday.  This is up from the 159 range before her tube.    The other issue is the CT scan from 1/6 that suggest T10-T11 destruction suggesting osteo however as far as I can tell she is not symptomatic here. I elected to wait until we had nutritional support before further investigating this. She will need a dedicated MRI. Also rapidly progressive cognitive loss over the last 6 months will need imaging. I did not think it was wise to do these tests while she was starving  LABORATORY DATA:  Her most recent lab work showed:    Albumin 2.2, which is actually an improvement.    Sodium 131, potassium 5, BUN 22, creatinine 1.06.    Liver function tests normal.    CBC:  White count 10.2, hemoglobin 10.7, platelet count 377.   Differential showed 87% neutrophils.    Abdominal:  Exam showed actually a negative finding.    REVIEW OF SYSTEMS:   I do not really see a benefit to this.  The patient is not conversing coherently.    PHYSICAL EXAMINATION:   GENERAL APPEARANCE:  The patient has  really deteriorated.   CHEST/RESPIRATORY:  Clear air entry bilaterally.    CARDIOVASCULAR:   CARDIAC:  Heart sounds are normal.  She appears to be euvolemic.      GASTROINTESTINAL:   ABDOMEN:  Her PEG site looks fine.  No masses.     LIVER/SPLEEN/KIDNEYS:  No liver, no spleen.   GENITOURINARY:   BLADDER:  Foley catheter is in place.    SKIN:   INSPECTION:  I did not inspect her wounds.    ASSESSMENT/PLAN:                 Weight loss, major depression, failure to thrive.    Abnormal T10, T11 imaging on a CT scan of the abdomen on 12/12/2015.  She had endplate destruction, suspicious for acute diskitis or osteomyelitis.  The patient has been in no condition to undergo any further testing here.  I will probably do an MRI of this area and then consider her for an aspiration of the area and biopsy.  I just do not think that she is in any state currently to go through this. I will order this in the next week. ?malignancy   Major depression.  She is currently on imipramine 75 b.i.d.  I believe Psychiatry started her on Abilify as a secondary agent.  This seems to have stabilized things somewhat.  Deteriorating mental status.  The cause of this is not really clear.  When she came into the building first in August, I really thought her cognition was normal.  She has deteriorated quite a bit.  I think she probably requires a CT scan of her head before we go ahead and work on her thoracic spine problem.   I wonder if this could be malignant disease. She will also definitely need a head MRI or at least a Ct  The patient has dramatically declined. We will see if we can actually get her through additional tests   CPT CODE: [id number]

## 2016-01-07 DEATH — deceased

## 2016-01-08 ENCOUNTER — Other Ambulatory Visit: Payer: Self-pay | Admitting: Licensed Clinical Social Worker

## 2016-01-08 ENCOUNTER — Encounter: Payer: Self-pay | Admitting: Licensed Clinical Social Worker

## 2016-01-08 NOTE — Patient Outreach (Signed)
Assessment:  CSW received information on 01/11/2016 that client ZERINA HALLINAN had died on 07-Jan-2016.  Thus, CSW is discharging MALARY AYLESWORTH from [facility] CSW services on 01/11/16 due to death of client on 01/07/2016.   Plan; CSW to inform Sherle Poe that CSW discharged Elnora Morrison. [place] on 01/11/16 due to death of client on January 07, 2016. CSW to fax physician case closure letter to Dr. Jeanice Lim on 01/11/2016.   Kelton Pillar.Masae Lukacs MSW, LCSW Licensed Clinical Social Worker [facility] Care Management [phone]

## 2016-01-13 ENCOUNTER — Ambulatory Visit: Payer: Commercial Managed Care - HMO | Admitting: Licensed Clinical Social Worker

## 2016-01-22 ENCOUNTER — Ambulatory Visit: Payer: Commercial Managed Care - HMO | Admitting: Licensed Clinical Social Worker

## 2016-05-05 IMAGING — DX DG CHEST 1V
1 series · 1 of 1 positions shown · non-contrast
Comparison: Portable chest x-ray of 10/14/2015

CLINICAL DATA: Evaluate PICC line placement

EXAM:
CHEST 1 VIEW

[chest ap]
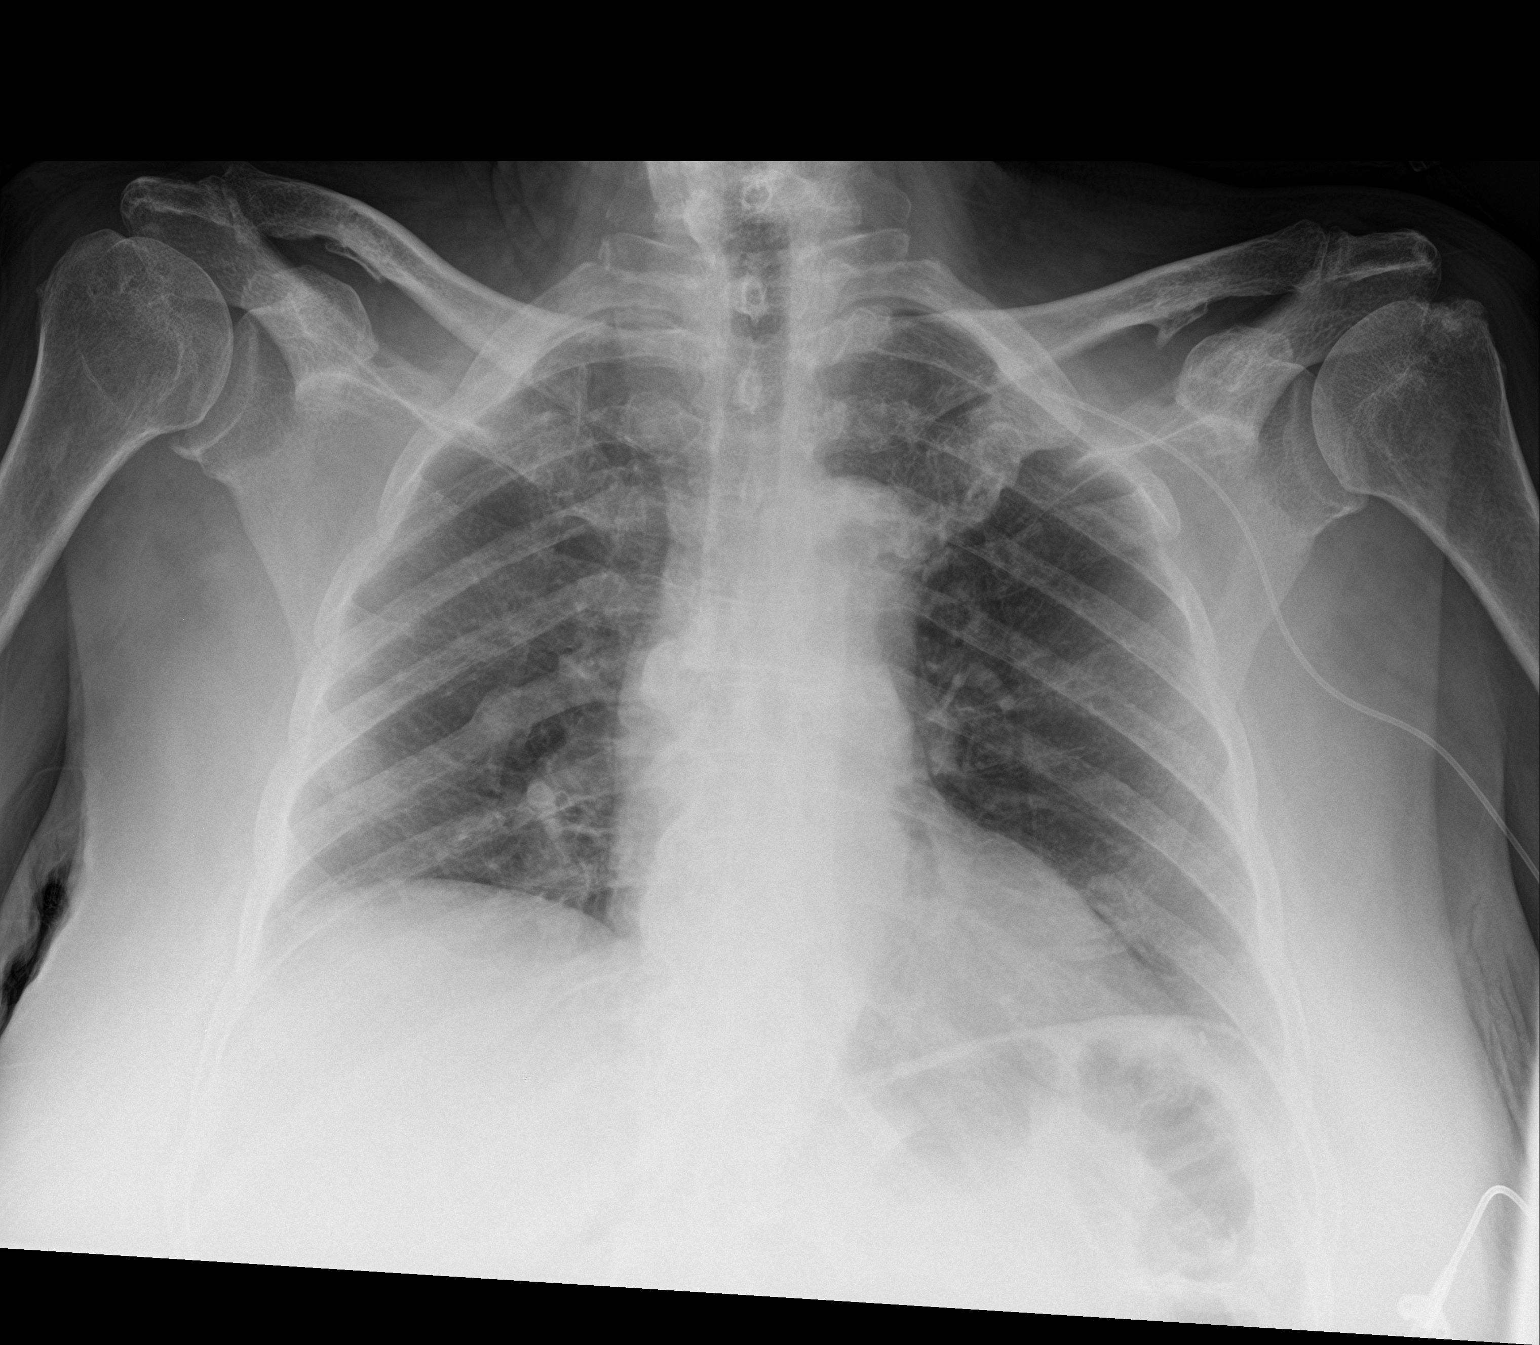

[1 of 1 positions shown; findings below may reference images not displayed]

FINDINGS: The tip of the PICC line overlies the the left axilla probably
within the left axillary vein or distal left subclavian vein. No
pneumothorax is seen. The lungs are clear. Heart size is stable.
IMPRESSION: The tip of the left PICC line overlies the region of the expected
left axillary vein or distal left subclavian vein. No pneumothorax.

## 2016-05-05 IMAGING — DX DG HUMERUS 2V *L*
2 series · 2 of 2 positions shown · non-contrast
Comparison: None.

CLINICAL DATA: PICC line placement.

EXAM:
LEFT HUMERUS - 2+ VIEW

[humerus ap]
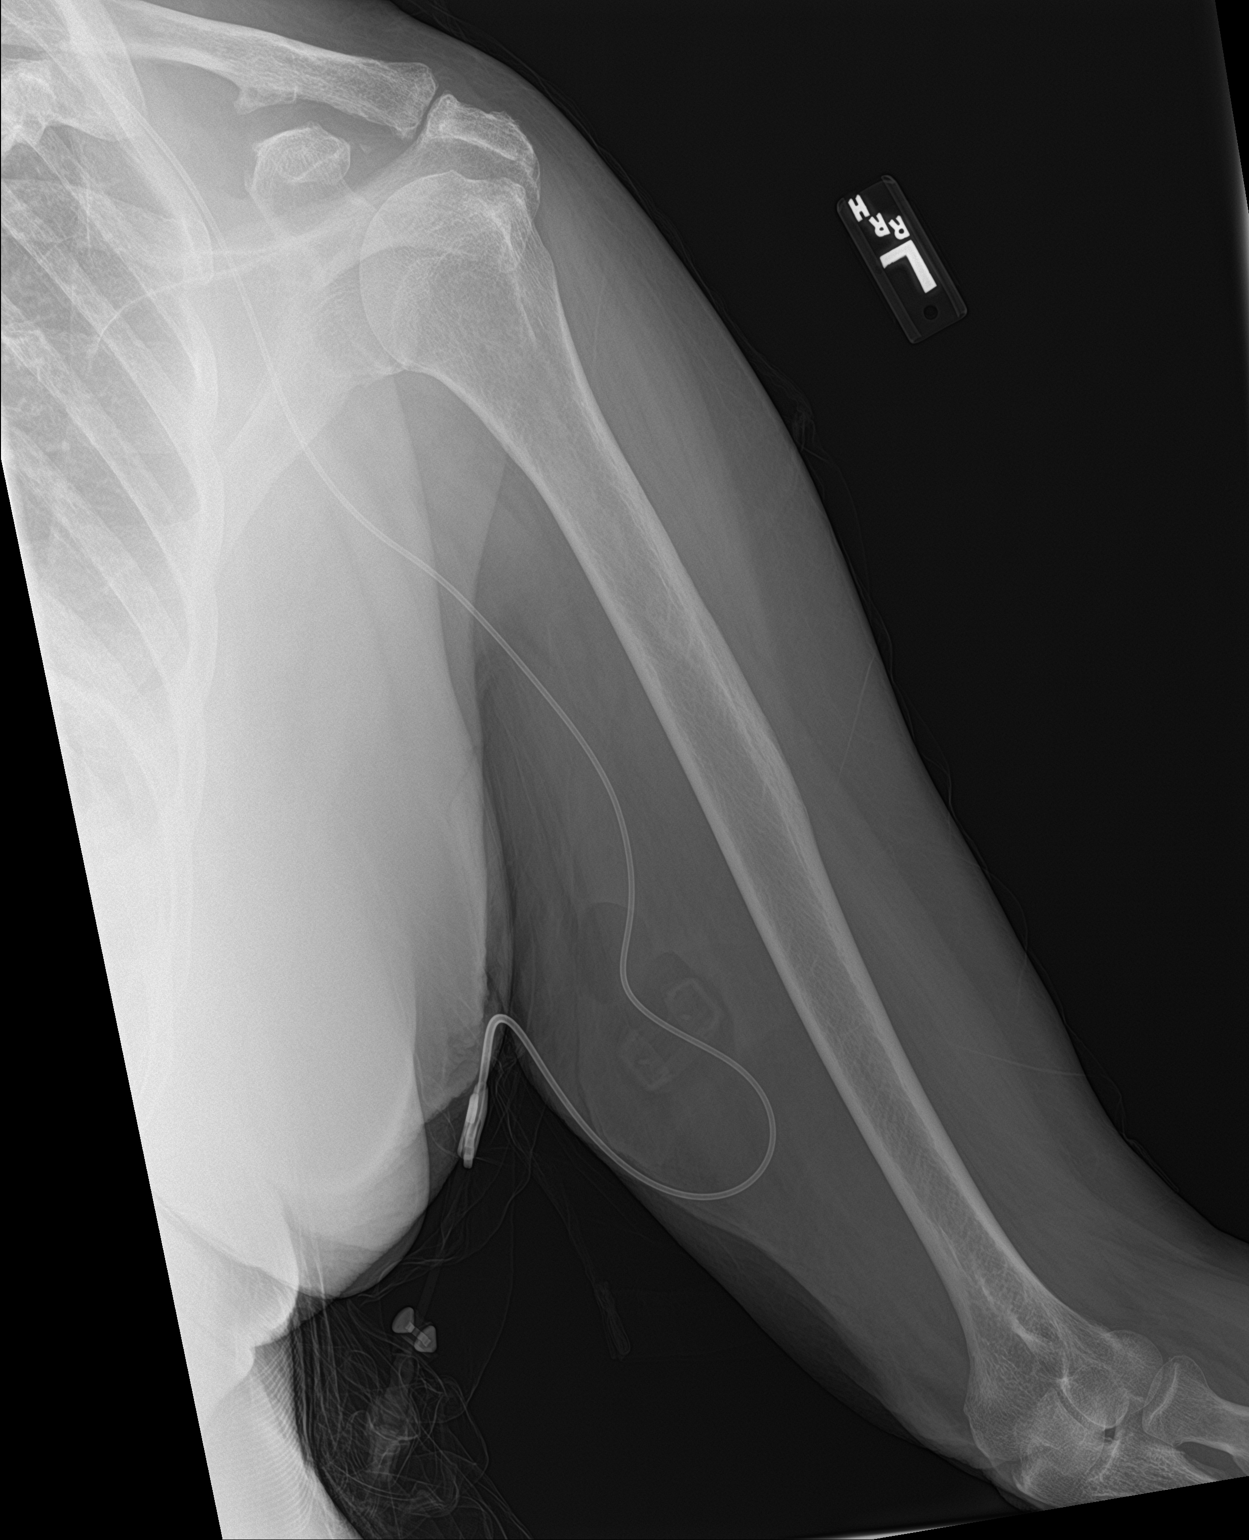

[humerus lat]
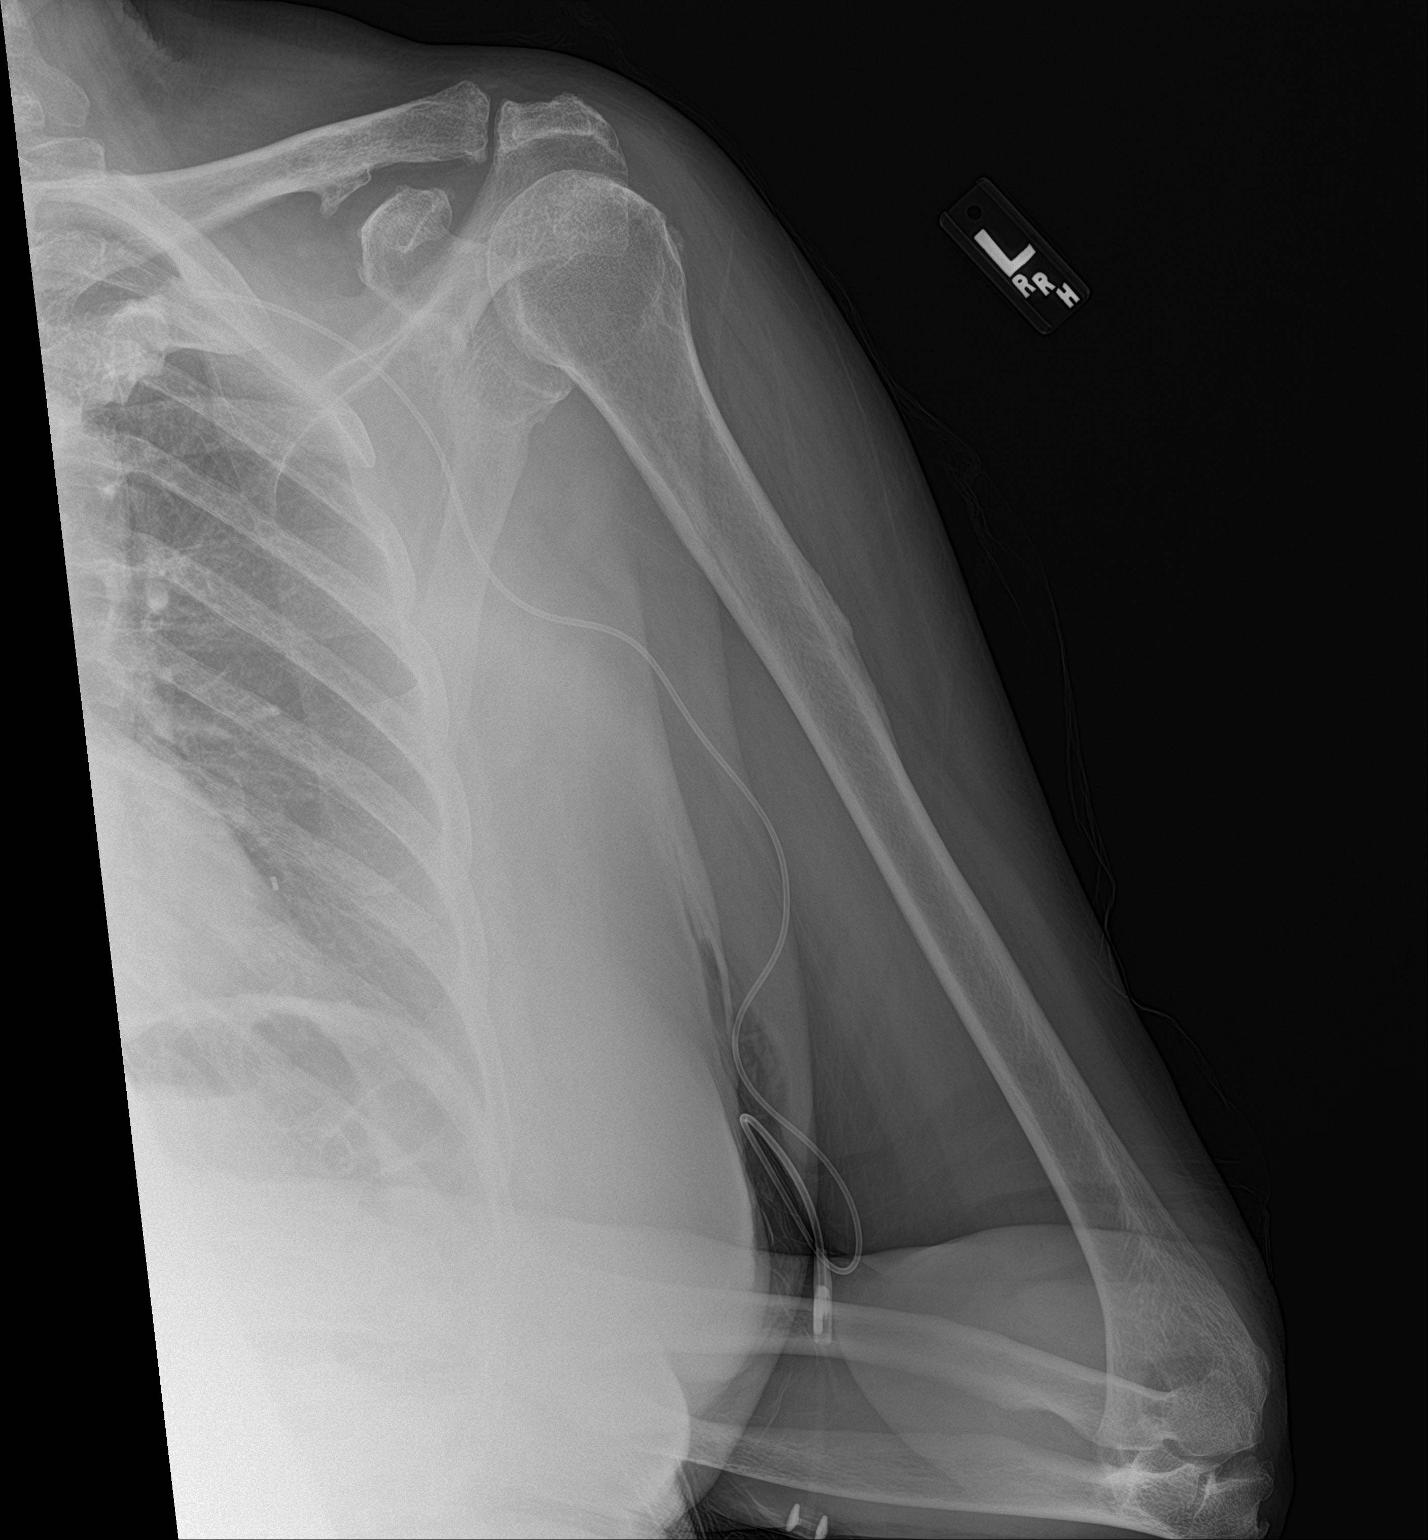

[2 of 2 positions shown; findings below may reference images not displayed]

FINDINGS: There is no evidence of fracture or other focal bone lesions. PICC
line is seen in soft tissues of left upper arm, with distal tip in
expected position of left axillary vein.
IMPRESSION: No fracture or dislocation is noted. Distal tip of PICC line is in
expected position of the left axillary vein.

## 2016-05-06 IMAGING — CT CT PELVIS W/O CM
2 of 5 series · 15 of 46 positions shown, 18 images · non-contrast
Comparison: Abdominal films 10/14/2015

CLINICAL DATA: Elevated white count and ulcer to the left buttocks.

EXAM:
CT PELVIS WITHOUT CONTRAST
TECHNIQUE: Multidetector CT imaging of the pelvis was performed following the
standard protocol without intravenous contrast.

[Series 5: axial soft · axial · 0.80mm/px · z∈[-254,-30]mm · 12 of 126 slices shown, 15 images]
[im 9/126  soft-tissue]
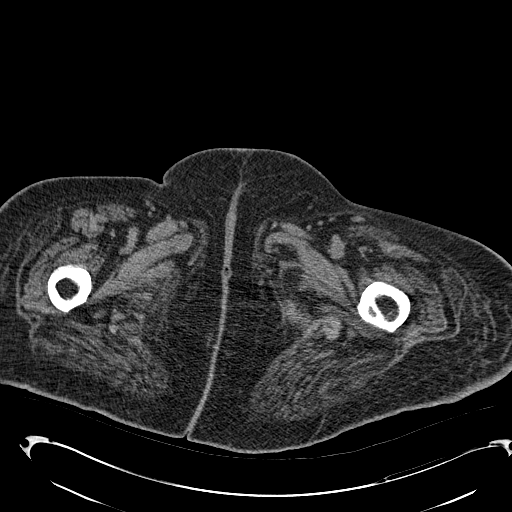
[im 9/126  bone]
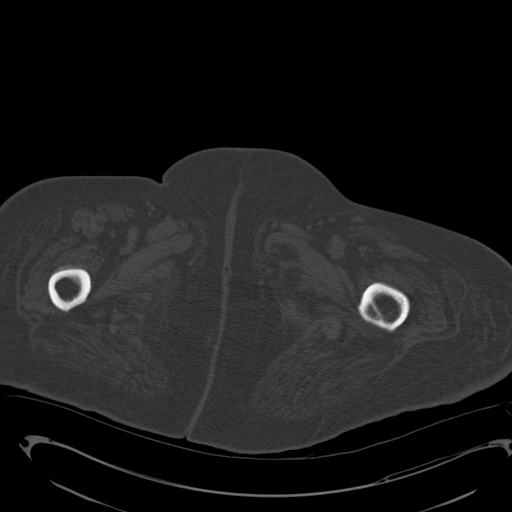
[im 25/126  soft-tissue]
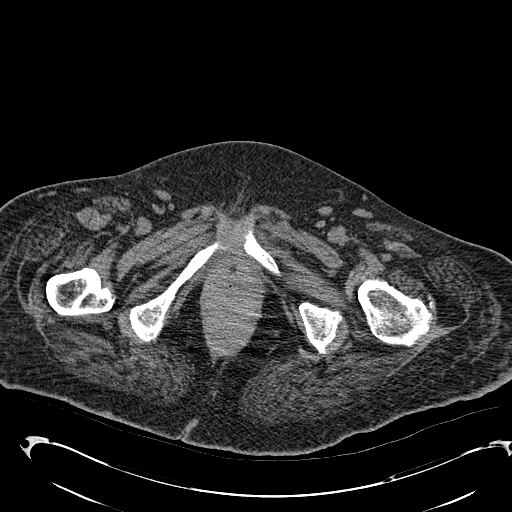
[im 37/126  soft-tissue]
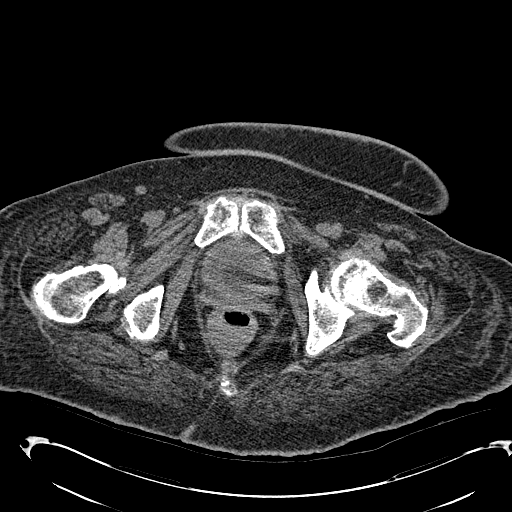
[im 49/126  soft-tissue]
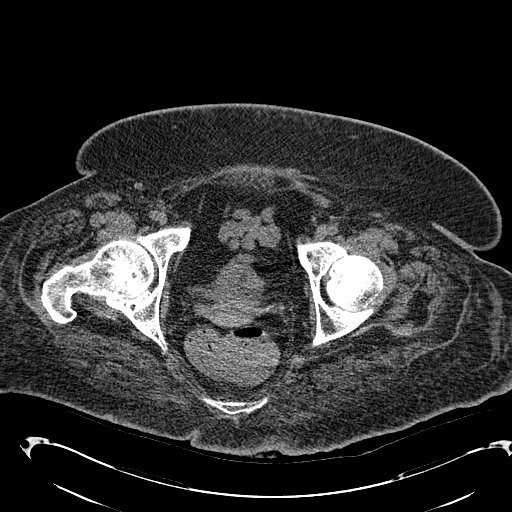
[im 65/126  soft-tissue]
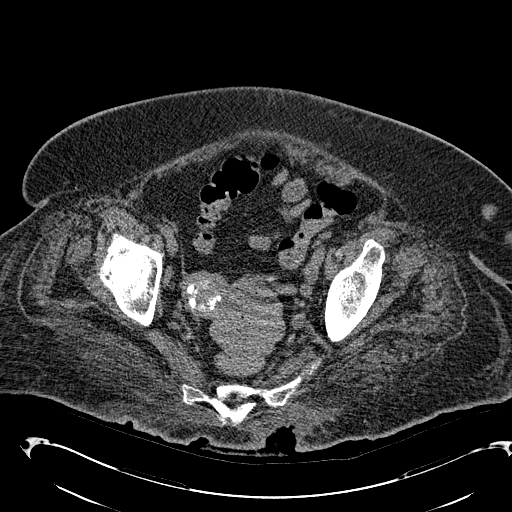
[im 77/126  soft-tissue]
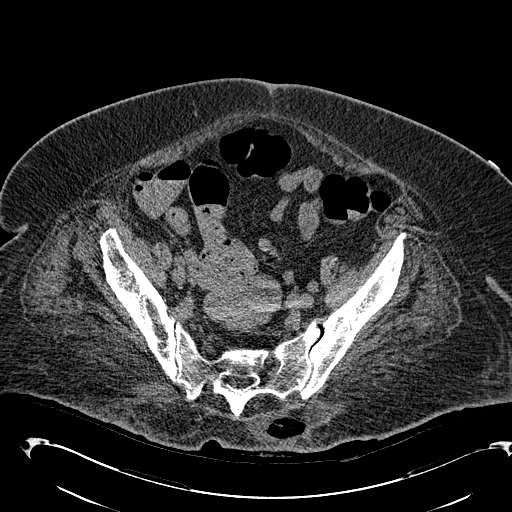
[im 89/126  soft-tissue]
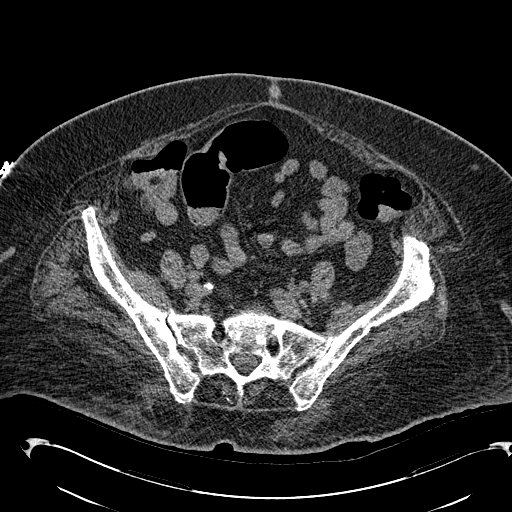
[im 105/126  soft-tissue]
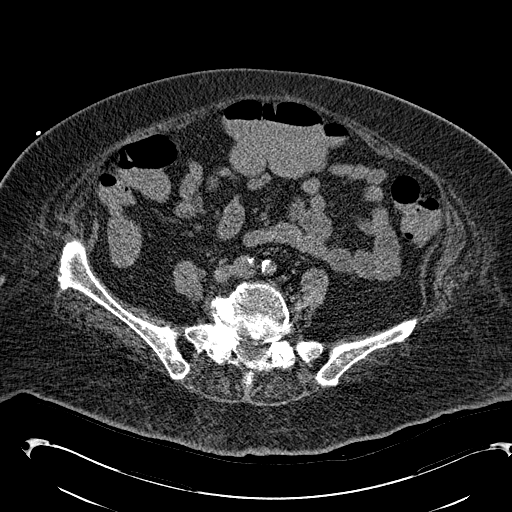
[im 109/126  lung]
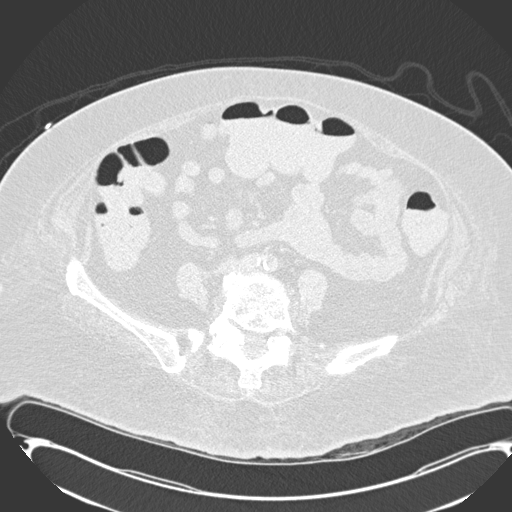
[im 113/126  lung]
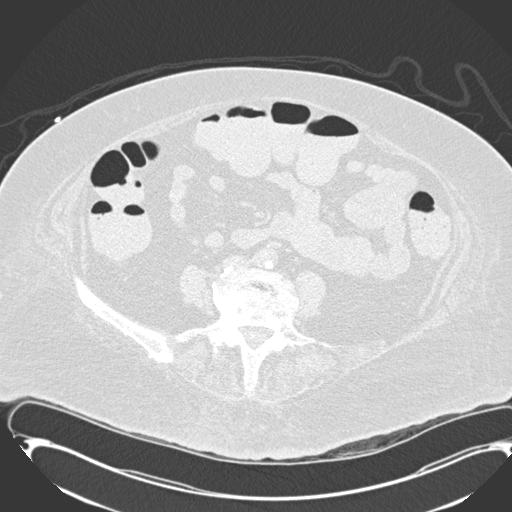
[im 117/126  soft-tissue]
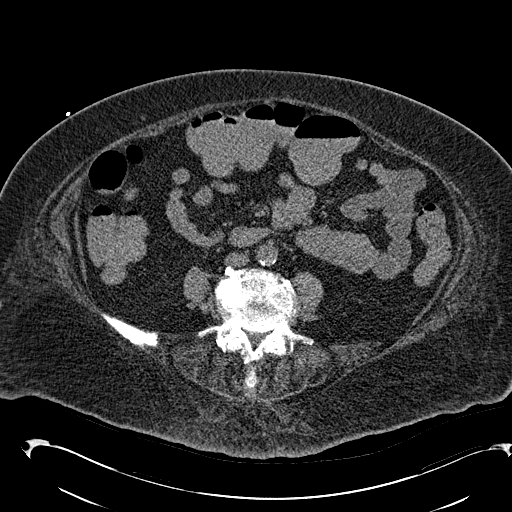
[im 117/126  lung]
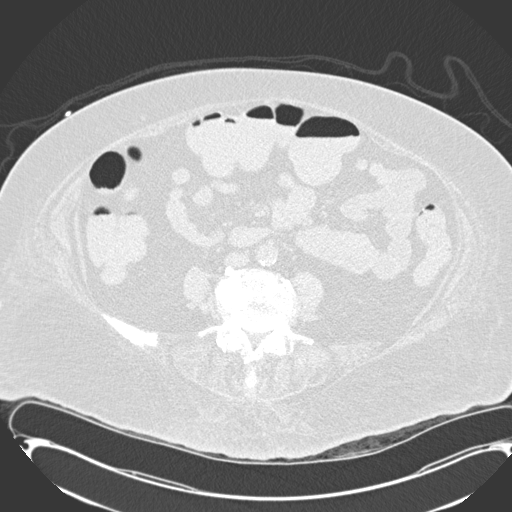
[im 117/126  bone]
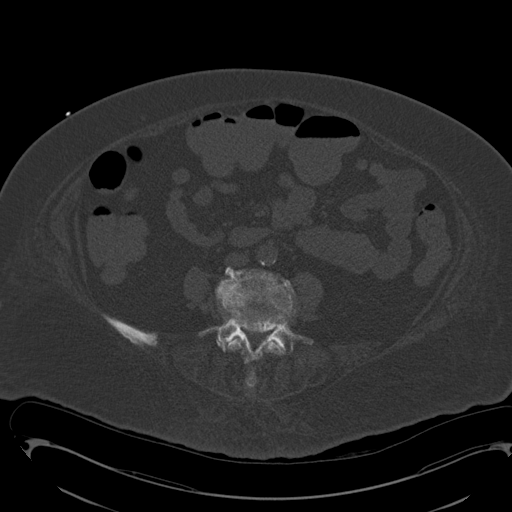
[im 121/126  lung]
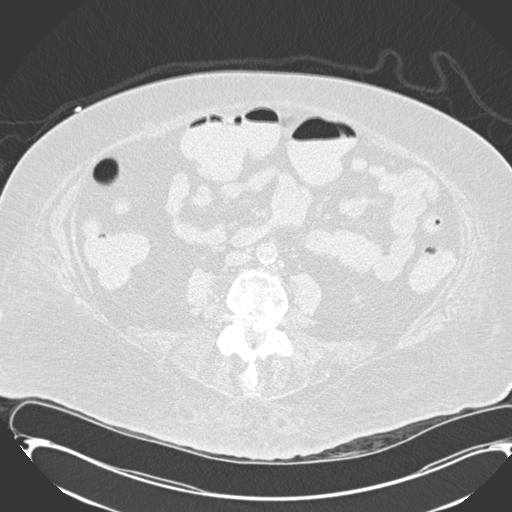

[Series 8: coronal soft · coronal · 0.54mm/px · 3 of 153 slices shown]
[im 39/153  soft-tissue]
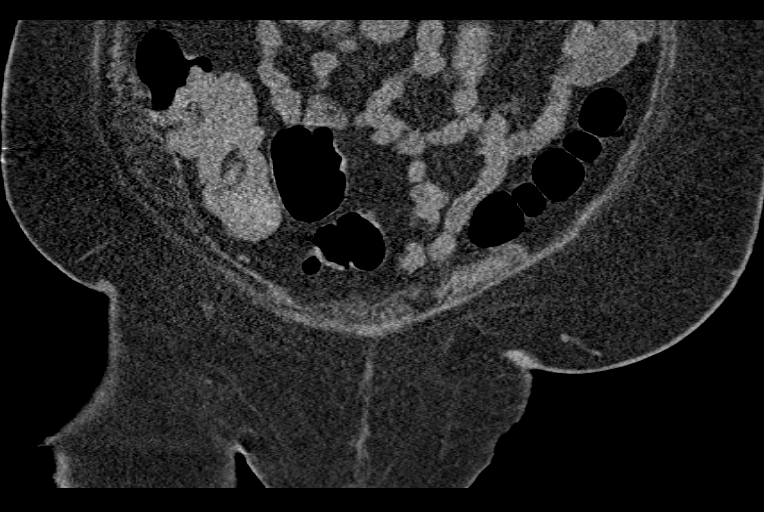
[im 77/153  soft-tissue]
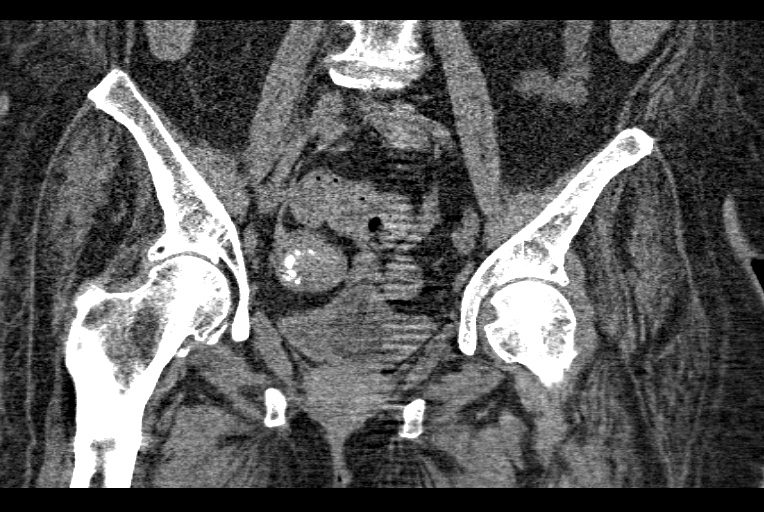
[im 115/153  soft-tissue]
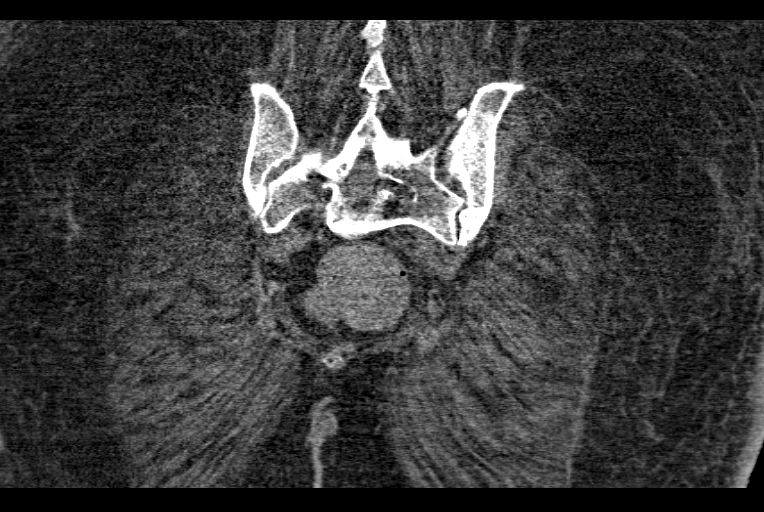

[15 of 46 positions shown; findings below may reference images not displayed]

FINDINGS: There is a deep skin and subcutaneous ulceration over the left
sacrum extending nearly to the underlying bone. Infiltration around
the tract consistent with cellulitis. No discrete fluid collection
to suggest abscess. The underlying sacrum and pelvis appear intact.
No evidence of cortical loss or bone erosion to suggest
osteomyelitis. No focal bone sclerosis or expansile bone lesions.

Degenerative changes in the hips. Degenerative changes in the SI
joints and lower lumbar spine. No acute fracture or dislocation. No
focal bone lesion or bone destruction.

Foley catheter in the bladder. Calcifications in the uterus likely
representing fibroid. No free or loculated pelvic fluid collections.
No pelvic mass or lymphadenopathy. Appendix is normal. Vascular
calcifications in the aorta and iliac arteries.
IMPRESSION: Deep skin and subcutaneous ulceration over the left sacrum. No focal
abscess. No bone involvement.

## 2016-11-27 NOTE — Progress Notes (Signed)
REVIEWED-NO ADDITIONAL RECOMMENDATIONS.
